# Patient Record
Sex: Male | Born: 1960 | Race: Black or African American | Hispanic: No | Marital: Married | Smoking: Former smoker
Health system: Southern US, Community
[De-identification: ages and names within clinical notes are randomized; demographics above are authoritative.]

## PROBLEM LIST (undated history)

## (undated) DIAGNOSIS — D509 Iron deficiency anemia, unspecified: Secondary | ICD-10-CM

## (undated) DIAGNOSIS — D649 Anemia, unspecified: Secondary | ICD-10-CM

## (undated) DIAGNOSIS — C61 Malignant neoplasm of prostate: Secondary | ICD-10-CM

## (undated) DIAGNOSIS — G629 Polyneuropathy, unspecified: Secondary | ICD-10-CM

## (undated) DIAGNOSIS — E1165 Type 2 diabetes mellitus with hyperglycemia: Secondary | ICD-10-CM

## (undated) DIAGNOSIS — N5231 Erectile dysfunction following radical prostatectomy: Secondary | ICD-10-CM

## (undated) DIAGNOSIS — E785 Hyperlipidemia, unspecified: Secondary | ICD-10-CM

## (undated) DIAGNOSIS — E118 Type 2 diabetes mellitus with unspecified complications: Secondary | ICD-10-CM

## (undated) DIAGNOSIS — A414 Sepsis due to anaerobes: Secondary | ICD-10-CM

## (undated) DIAGNOSIS — Z87442 Personal history of urinary calculi: Secondary | ICD-10-CM

## (undated) DIAGNOSIS — N200 Calculus of kidney: Secondary | ICD-10-CM

## (undated) DIAGNOSIS — F17200 Nicotine dependence, unspecified, uncomplicated: Secondary | ICD-10-CM

## (undated) DIAGNOSIS — M1990 Unspecified osteoarthritis, unspecified site: Secondary | ICD-10-CM

## (undated) DIAGNOSIS — N393 Stress incontinence (female) (male): Secondary | ICD-10-CM

## (undated) HISTORY — DX: Unspecified osteoarthritis, unspecified site: M19.90

## (undated) HISTORY — DX: Sepsis due to anaerobes: A41.4

## (undated) HISTORY — DX: Nicotine dependence, unspecified, uncomplicated: F17.200

## (undated) HISTORY — DX: Stress incontinence (female) (male): N39.3

## (undated) HISTORY — DX: Hyperlipidemia, unspecified: E78.5

## (undated) HISTORY — PX: PROSTATE SURGERY: SHX751

## (undated) HISTORY — DX: Type 2 diabetes mellitus with unspecified complications: E11.8

## (undated) HISTORY — DX: Malignant neoplasm of prostate: C61

## (undated) HISTORY — DX: Anemia, unspecified: D64.9

## (undated) HISTORY — DX: Polyneuropathy, unspecified: G62.9

## (undated) HISTORY — DX: Iron deficiency anemia, unspecified: D50.9

## (undated) HISTORY — DX: Type 2 diabetes mellitus with hyperglycemia: E11.65

## (undated) HISTORY — DX: Calculus of kidney: N20.0

## (undated) HISTORY — PX: WISDOM TOOTH EXTRACTION: SHX21

## (undated) HISTORY — DX: Erectile dysfunction following radical prostatectomy: N52.31

---

## 2001-10-30 ENCOUNTER — Encounter: Admission: RE | Admit: 2001-10-30 | Discharge: 2001-10-30 | Payer: Self-pay | Admitting: Urology

## 2001-10-30 ENCOUNTER — Encounter: Payer: Self-pay | Admitting: Urology

## 2005-09-25 ENCOUNTER — Encounter: Admission: RE | Admit: 2005-09-25 | Discharge: 2005-09-25 | Payer: Self-pay | Admitting: Family Medicine

## 2009-12-12 ENCOUNTER — Encounter: Admission: RE | Admit: 2009-12-12 | Discharge: 2010-03-12 | Payer: Self-pay | Admitting: General Practice

## 2011-09-24 ENCOUNTER — Emergency Department (HOSPITAL_COMMUNITY)
Admission: EM | Admit: 2011-09-24 | Discharge: 2011-09-24 | Discharge status: Against Medical Advice | Payer: BC Managed Care – PPO | Attending: Emergency Medicine | Admitting: Emergency Medicine

## 2011-09-24 ENCOUNTER — Encounter: Payer: Self-pay | Admitting: Nurse Practitioner

## 2011-09-24 DIAGNOSIS — Z0389 Encounter for observation for other suspected diseases and conditions ruled out: Secondary | ICD-10-CM | POA: Insufficient documentation

## 2011-09-24 LAB — GLUCOSE, CAPILLARY: Glucose-Capillary: 310 mg/dL — ABNORMAL HIGH (ref 70–99)

## 2011-09-24 NOTE — ED Notes (Signed)
States he was on a steroid for bronchitis last month and since then unable to get his blood sugar WNL. States it has been high mostly every day. Reports frequent urination and weight loss over past month

## 2011-09-24 NOTE — ED Notes (Signed)
Pt states that he wants to leave AMA; feels like he jumped the gun by coming in - currently on steroids. States that he feels fine and will follow up with his family physician.

## 2011-09-24 NOTE — ED Notes (Signed)
Pt st's he is diabetic but did not take any meds for same.  St's he controlled it with diet and exercise. In Dec. He was on steroids for bronchitis and it caused his sugar to go up and has been up since then.

## 2011-09-25 HISTORY — PX: PROSTATE SURGERY: SHX751

## 2012-05-29 DIAGNOSIS — C61 Malignant neoplasm of prostate: Secondary | ICD-10-CM | POA: Insufficient documentation

## 2012-08-11 DIAGNOSIS — E1140 Type 2 diabetes mellitus with diabetic neuropathy, unspecified: Secondary | ICD-10-CM | POA: Insufficient documentation

## 2012-08-11 DIAGNOSIS — G629 Polyneuropathy, unspecified: Secondary | ICD-10-CM

## 2012-08-11 DIAGNOSIS — M1990 Unspecified osteoarthritis, unspecified site: Secondary | ICD-10-CM

## 2012-08-11 DIAGNOSIS — N200 Calculus of kidney: Secondary | ICD-10-CM | POA: Insufficient documentation

## 2012-08-11 HISTORY — DX: Polyneuropathy, unspecified: G62.9

## 2012-08-11 HISTORY — DX: Unspecified osteoarthritis, unspecified site: M19.90

## 2013-02-03 DIAGNOSIS — N393 Stress incontinence (female) (male): Secondary | ICD-10-CM

## 2013-02-03 HISTORY — DX: Stress incontinence (female) (male): N39.3

## 2013-08-23 ENCOUNTER — Emergency Department (HOSPITAL_COMMUNITY)
Admission: EM | Admit: 2013-08-23 | Discharge: 2013-08-23 | Disposition: A | Discharge status: Home / Self Care | Payer: 59 | Attending: Emergency Medicine | Admitting: Emergency Medicine

## 2013-08-23 ENCOUNTER — Encounter (HOSPITAL_COMMUNITY): Payer: Self-pay | Admitting: Emergency Medicine

## 2013-08-23 DIAGNOSIS — R739 Hyperglycemia, unspecified: Secondary | ICD-10-CM

## 2013-08-23 DIAGNOSIS — F17200 Nicotine dependence, unspecified, uncomplicated: Secondary | ICD-10-CM | POA: Insufficient documentation

## 2013-08-23 DIAGNOSIS — R5381 Other malaise: Secondary | ICD-10-CM | POA: Insufficient documentation

## 2013-08-23 DIAGNOSIS — Z79899 Other long term (current) drug therapy: Secondary | ICD-10-CM | POA: Insufficient documentation

## 2013-08-23 DIAGNOSIS — Z8546 Personal history of malignant neoplasm of prostate: Secondary | ICD-10-CM | POA: Insufficient documentation

## 2013-08-23 DIAGNOSIS — E119 Type 2 diabetes mellitus without complications: Secondary | ICD-10-CM | POA: Insufficient documentation

## 2013-08-23 LAB — URINE MICROSCOPIC-ADD ON

## 2013-08-23 LAB — COMPREHENSIVE METABOLIC PANEL
ALT: 19 U/L (ref 0–53)
AST: 22 U/L (ref 0–37)
Albumin: 4.2 g/dL (ref 3.5–5.2)
Alkaline Phosphatase: 127 U/L — ABNORMAL HIGH (ref 39–117)
BUN: 16 mg/dL (ref 6–23)
CO2: 25 mEq/L (ref 19–32)
Calcium: 10 mg/dL (ref 8.4–10.5)
Chloride: 96 mEq/L (ref 96–112)
Creatinine, Ser: 1.14 mg/dL (ref 0.50–1.35)
GFR calc Af Amer: 83 mL/min — ABNORMAL LOW (ref >90)
GFR calc non Af Amer: 71 mL/min — ABNORMAL LOW (ref >90)
Glucose, Bld: 545 mg/dL — ABNORMAL HIGH (ref 70–99)
Potassium: 4.5 mEq/L (ref 3.5–5.1)
Sodium: 136 mEq/L (ref 135–145)
Total Bilirubin: 0.2 mg/dL — ABNORMAL LOW (ref 0.3–1.2)
Total Protein: 8.1 g/dL (ref 6.0–8.3)

## 2013-08-23 LAB — CBC
HCT: 43.6 % (ref 39.0–52.0)
Hemoglobin: 14.2 g/dL (ref 13.0–17.0)
MCH: 30 pg (ref 26.0–34.0)
MCHC: 32.6 g/dL (ref 30.0–36.0)
MCV: 92.2 fL (ref 78.0–100.0)
Platelets: 235 10*3/uL (ref 150–400)
RBC: 4.73 MIL/uL (ref 4.22–5.81)
RDW: 12.9 % (ref 11.5–15.5)
WBC: 7.9 10*3/uL (ref 4.0–10.5)

## 2013-08-23 LAB — URINALYSIS, ROUTINE W REFLEX MICROSCOPIC
Bilirubin Urine: NEGATIVE
Glucose, UA: >1000 mg/dL — AB
Hgb urine dipstick: NEGATIVE
Ketones, ur: NEGATIVE mg/dL
Leukocytes, UA: NEGATIVE
Nitrite: NEGATIVE
Protein, ur: NEGATIVE mg/dL
Specific Gravity, Urine: 1.043 — ABNORMAL HIGH (ref 1.005–1.030)
Urobilinogen, UA: 0.2 mg/dL (ref 0.0–1.0)
pH: 5 (ref 5.0–8.0)

## 2013-08-23 LAB — GLUCOSE, CAPILLARY
Glucose-Capillary: 428 mg/dL — ABNORMAL HIGH (ref 70–99)
Glucose-Capillary: 285 mg/dL — ABNORMAL HIGH (ref 70–99)
Glucose-Capillary: 239 mg/dL — ABNORMAL HIGH (ref 70–99)

## 2013-08-23 MED ORDER — SODIUM CHLORIDE 0.9 % IV BOLUS (SEPSIS)
1000.0000 mL | Freq: Once | INTRAVENOUS | Status: AC
  Administered 2013-08-23: 1000 mL via INTRAVENOUS

## 2013-08-23 MED ORDER — INSULIN ASPART 100 UNIT/ML ~~LOC~~ SOLN
12.0000 [IU] | Freq: Once | SUBCUTANEOUS | Status: AC
  Administered 2013-08-23: 12 [IU] via INTRAVENOUS
  Filled 2013-08-23: qty 1

## 2013-08-23 MED ORDER — INSULIN ASPART 100 UNIT/ML ~~LOC~~ SOLN
10.0000 [IU] | Freq: Once | SUBCUTANEOUS | Status: AC
  Administered 2013-08-23: 10 [IU] via INTRAVENOUS
  Filled 2013-08-23: qty 1

## 2013-08-23 NOTE — ED Provider Notes (Signed)
CSN: [id number]     Arrival date & time 08/23/13  1359 History   First MD Initiated Contact with Patient 08/23/13 1423     Chief Complaint  Patient presents with  . Hyperglycemia   (Consider location/radiation/quality/duration/timing/severity/associated sxs/prior Treatment) HPI  52 year old male with hyperglycemia. Known type II diabetic. Patient states he noticed his sugars for the past month or so but has delayed seeking care. He is on glimepiride and Janumet. He reports he takes them most days but not every day and not always twice a day. He estimates he actually takes about half the recommended dose of both medicines.  He is feeling fatigued and the past month or so. Describes is low energy levels. Does feel thirsty and has been urinating a lot. No urinary complaints otherwise. No fevers or chills. No rash. No cough. No unusual swelling. Past Medical History  Diagnosis Date  . Diabetes mellitus   . Cancer     prostate   History reviewed. No pertinent past surgical history. No family history on file. History  Substance Use Topics  . Smoking status: Current Every Day Smoker -- 0.33 packs/day    Types: Cigarettes  . Smokeless tobacco: Not on file  . Alcohol Use: Yes     Comment: occassional    Review of Systems  All systems reviewed and negative, other than as noted in HPI.   Allergies  Codeine  Home Medications   Current Outpatient Rx  Name  Route  Sig  Dispense  Refill  . atorvastatin (LIPITOR) 40 MG tablet   Oral   Take 40 mg by mouth daily.         [name] glimepiride (AMARYL) 4 MG tablet   Oral   Take 4 mg by mouth 2 (two) times daily.         . sitaGLIPtin-metformin (JANUMET) 50-1000 MG per tablet   Oral   Take 1 tablet by mouth 2 (two) times daily with a meal.          BP 119/75  Pulse 100  Temp(Src) 98.1 F (36.7 C) (Oral)  Resp 18  SpO2 100% Physical Exam  Nursing note and vitals reviewed. Constitutional: He appears well-developed and  well-nourished. No distress.  HENT:  Head: Normocephalic and atraumatic.  Eyes: Conjunctivae are normal. Right eye exhibits no discharge. Left eye exhibits no discharge.  Neck: Neck supple.  Cardiovascular: Normal rate, regular rhythm and normal heart sounds.  Exam reveals no gallop and no friction rub.   No murmur heard. Pulmonary/Chest: Effort normal and breath sounds normal. No respiratory distress.  Abdominal: Soft. He exhibits no distension. There is no tenderness.  Musculoskeletal: He exhibits no edema and no tenderness.  Neurological: He is alert.  Skin: Skin is warm and dry.  Psychiatric: He has a normal mood and affect. His behavior is normal. Thought content normal.    ED Course  Procedures (including critical care time) Labs Review Labs Reviewed  GLUCOSE, CAPILLARY - Abnormal; Notable for the following:    Glucose-Capillary 428 (*)    All other components within normal limits  COMPREHENSIVE METABOLIC PANEL - Abnormal; Notable for the following:    Glucose, Bld 545 (*)    Alkaline Phosphatase 127 (*)    Total Bilirubin 0.2 (*)    GFR calc non Af Amer 71 (*)    GFR calc Af Amer 83 (*)    All other components within normal limits  URINALYSIS, ROUTINE W REFLEX MICROSCOPIC - Abnormal; Notable for the following:  APPearance CLOUDY (*)    Specific Gravity, Urine 1.043 (*)    Glucose, UA >1000 (*)    All other components within normal limits  GLUCOSE, CAPILLARY - Abnormal; Notable for the following:    Glucose-Capillary 285 (*)    All other components within normal limits  CBC  URINE MICROSCOPIC-ADD ON   Imaging Review No results found.  EKG Interpretation   None       MDM   1. Hyperglycemia without ketosis    52 year old male with hyperglycemia. No anion gap. No hematuria. Hemodynamically stable. Improved with IV fluids and insulin. Patient is poorly compliant with his current medications. Discussed the need for compliance as it'll make any additional  medication recommendations more difficult. Understands the chronicity of a disease such as diabetes and the need for regular medical care, ideally by a single provider who can continue to follow him. Return precautions were discussed.    Raeford Razor, MD 08/26/13 [phone]

## 2013-08-23 NOTE — ED Notes (Signed)
Pt states that he is type 2 Diabetic and his blood sugar has been high for past month or so and pt stated that today he was going to do something about it.  Pt states that he went to urgent care and was told to come to ED.  Pt states that his CBG read 550 at home earlier this morning.

## 2015-03-22 ENCOUNTER — Emergency Department (HOSPITAL_COMMUNITY)
Admission: EM | Admit: 2015-03-22 | Discharge: 2015-03-23 | Discharge status: Against Medical Advice | Payer: Self-pay | Attending: Emergency Medicine | Admitting: Emergency Medicine

## 2015-03-22 ENCOUNTER — Encounter (HOSPITAL_COMMUNITY): Payer: Self-pay | Admitting: Emergency Medicine

## 2015-03-22 DIAGNOSIS — R1110 Vomiting, unspecified: Secondary | ICD-10-CM | POA: Insufficient documentation

## 2015-03-22 DIAGNOSIS — Z720 Tobacco use: Secondary | ICD-10-CM | POA: Insufficient documentation

## 2015-03-22 DIAGNOSIS — R6883 Chills (without fever): Secondary | ICD-10-CM | POA: Insufficient documentation

## 2015-03-22 DIAGNOSIS — E119 Type 2 diabetes mellitus without complications: Secondary | ICD-10-CM | POA: Insufficient documentation

## 2015-03-22 MED ORDER — ONDANSETRON 8 MG PO TBDP
8.0000 mg | ORAL_TABLET | Freq: Once | ORAL | Status: AC
  Administered 2015-03-22: 8 mg via ORAL
  Filled 2015-03-22: qty 1

## 2015-03-22 NOTE — ED Notes (Signed)
Pt presents to ED c/o emesis and chills for the last hour and a half.  He has vomited three times in the last hour but denies pain at this time.  He also reports intermittent chills.  No diarrhea yet but he is concerned that it could happen.  No dizziness.

## 2015-03-23 ENCOUNTER — Encounter (HOSPITAL_COMMUNITY): Payer: Self-pay | Admitting: Emergency Medicine

## 2015-03-23 ENCOUNTER — Emergency Department (HOSPITAL_COMMUNITY): Payer: Self-pay

## 2015-03-23 ENCOUNTER — Inpatient Hospital Stay (HOSPITAL_COMMUNITY)
Admission: EM | Admit: 2015-03-23 | Discharge: 2015-03-26 | DRG: 872 | Disposition: A | Discharge status: Home / Self Care | Payer: Self-pay | Attending: Internal Medicine | Admitting: Internal Medicine

## 2015-03-23 ENCOUNTER — Inpatient Hospital Stay (HOSPITAL_COMMUNITY): Payer: MEDICAID

## 2015-03-23 DIAGNOSIS — D72829 Elevated white blood cell count, unspecified: Secondary | ICD-10-CM

## 2015-03-23 DIAGNOSIS — R52 Pain, unspecified: Secondary | ICD-10-CM

## 2015-03-23 DIAGNOSIS — E876 Hypokalemia: Secondary | ICD-10-CM | POA: Diagnosis present

## 2015-03-23 DIAGNOSIS — Z885 Allergy status to narcotic agent status: Secondary | ICD-10-CM

## 2015-03-23 DIAGNOSIS — Z9079 Acquired absence of other genital organ(s): Secondary | ICD-10-CM | POA: Diagnosis present

## 2015-03-23 DIAGNOSIS — IMO0002 Reserved for concepts with insufficient information to code with codable children: Secondary | ICD-10-CM

## 2015-03-23 DIAGNOSIS — R6510 Systemic inflammatory response syndrome (SIRS) of non-infectious origin without acute organ dysfunction: Secondary | ICD-10-CM

## 2015-03-23 DIAGNOSIS — A419 Sepsis, unspecified organism: Secondary | ICD-10-CM | POA: Diagnosis present

## 2015-03-23 DIAGNOSIS — C61 Malignant neoplasm of prostate: Secondary | ICD-10-CM

## 2015-03-23 DIAGNOSIS — Z87440 Personal history of urinary (tract) infections: Secondary | ICD-10-CM

## 2015-03-23 DIAGNOSIS — N12 Tubulo-interstitial nephritis, not specified as acute or chronic: Secondary | ICD-10-CM | POA: Diagnosis present

## 2015-03-23 DIAGNOSIS — N179 Acute kidney failure, unspecified: Secondary | ICD-10-CM | POA: Diagnosis present

## 2015-03-23 DIAGNOSIS — N289 Disorder of kidney and ureter, unspecified: Secondary | ICD-10-CM

## 2015-03-23 DIAGNOSIS — R740 Nonspecific elevation of levels of transaminase and lactic acid dehydrogenase [LDH]: Secondary | ICD-10-CM | POA: Diagnosis present

## 2015-03-23 DIAGNOSIS — Z6832 Body mass index (BMI) 32.0-32.9, adult: Secondary | ICD-10-CM

## 2015-03-23 DIAGNOSIS — E1165 Type 2 diabetes mellitus with hyperglycemia: Secondary | ICD-10-CM | POA: Diagnosis present

## 2015-03-23 DIAGNOSIS — Z794 Long term (current) use of insulin: Secondary | ICD-10-CM

## 2015-03-23 DIAGNOSIS — A414 Sepsis due to anaerobes: Secondary | ICD-10-CM | POA: Insufficient documentation

## 2015-03-23 DIAGNOSIS — R7881 Bacteremia: Secondary | ICD-10-CM | POA: Diagnosis present

## 2015-03-23 DIAGNOSIS — B961 Klebsiella pneumoniae [K. pneumoniae] as the cause of diseases classified elsewhere: Secondary | ICD-10-CM | POA: Diagnosis present

## 2015-03-23 DIAGNOSIS — Z8546 Personal history of malignant neoplasm of prostate: Secondary | ICD-10-CM

## 2015-03-23 DIAGNOSIS — E669 Obesity, unspecified: Secondary | ICD-10-CM | POA: Diagnosis present

## 2015-03-23 DIAGNOSIS — Z79899 Other long term (current) drug therapy: Secondary | ICD-10-CM

## 2015-03-23 DIAGNOSIS — E785 Hyperlipidemia, unspecified: Secondary | ICD-10-CM | POA: Diagnosis present

## 2015-03-23 DIAGNOSIS — N390 Urinary tract infection, site not specified: Secondary | ICD-10-CM

## 2015-03-23 DIAGNOSIS — F17210 Nicotine dependence, cigarettes, uncomplicated: Secondary | ICD-10-CM | POA: Diagnosis present

## 2015-03-23 DIAGNOSIS — A4159 Other Gram-negative sepsis: Principal | ICD-10-CM | POA: Diagnosis present

## 2015-03-23 HISTORY — DX: Reserved for concepts with insufficient information to code with codable children: IMO0002

## 2015-03-23 HISTORY — DX: Type 2 diabetes mellitus with hyperglycemia: E11.65

## 2015-03-23 LAB — URINE MICROSCOPIC-ADD ON

## 2015-03-23 LAB — GLUCOSE, CAPILLARY
GLUCOSE-CAPILLARY: 179 mg/dL — AB (ref 65–99)
Glucose-Capillary: 145 mg/dL — ABNORMAL HIGH (ref 65–99)

## 2015-03-23 LAB — CBC WITH DIFFERENTIAL/PLATELET
BASOS ABS: 0 10*3/uL (ref 0.0–0.1)
BASOS PCT: 0 % (ref 0–1)
Basophils Absolute: 0 10*3/uL (ref 0.0–0.1)
Basophils Relative: 0 % (ref 0–1)
EOS ABS: 0 10*3/uL (ref 0.0–0.7)
EOS ABS: 0 10*3/uL (ref 0.0–0.7)
EOS PCT: 0 % (ref 0–5)
EOS PCT: 1 % (ref 0–5)
HEMATOCRIT: 37.9 % — AB (ref 39.0–52.0)
HEMATOCRIT: 41.3 % (ref 39.0–52.0)
HEMOGLOBIN: 13 g/dL (ref 13.0–17.0)
Hemoglobin: 12 g/dL — ABNORMAL LOW (ref 13.0–17.0)
LYMPHS ABS: 0.4 10*3/uL — AB (ref 0.7–4.0)
LYMPHS PCT: 14 % (ref 12–46)
Lymphocytes Relative: 2 % — ABNORMAL LOW (ref 12–46)
Lymphs Abs: 1 10*3/uL (ref 0.7–4.0)
MCH: 29.9 pg (ref 26.0–34.0)
MCH: 30.1 pg (ref 26.0–34.0)
MCHC: 31.5 g/dL (ref 30.0–36.0)
MCHC: 31.7 g/dL (ref 30.0–36.0)
MCV: 94.5 fL (ref 78.0–100.0)
MCV: 95.6 fL (ref 78.0–100.0)
MONO ABS: 0 10*3/uL — AB (ref 0.1–1.0)
Monocytes Absolute: 0.4 10*3/uL (ref 0.1–1.0)
Monocytes Relative: 0 % — ABNORMAL LOW (ref 3–12)
Monocytes Relative: 2 % — ABNORMAL LOW (ref 3–12)
Neutro Abs: 17.2 10*3/uL — ABNORMAL HIGH (ref 1.7–7.7)
Neutro Abs: 6 10*3/uL (ref 1.7–7.7)
Neutrophils Relative %: 85 % — ABNORMAL HIGH (ref 43–77)
Neutrophils Relative %: 96 % — ABNORMAL HIGH (ref 43–77)
Platelets: 201 10*3/uL (ref 150–400)
Platelets: 244 10*3/uL (ref 150–400)
RBC: 4.01 MIL/uL — ABNORMAL LOW (ref 4.22–5.81)
RBC: 4.32 MIL/uL (ref 4.22–5.81)
RDW: 13.4 % (ref 11.5–15.5)
RDW: 13.5 % (ref 11.5–15.5)
WBC MORPHOLOGY: INCREASED
WBC: 18 10*3/uL — ABNORMAL HIGH (ref 4.0–10.5)
WBC: 7.1 10*3/uL (ref 4.0–10.5)

## 2015-03-23 LAB — URINALYSIS, ROUTINE W REFLEX MICROSCOPIC
Ketones, ur: NEGATIVE mg/dL
NITRITE: POSITIVE — AB
Protein, ur: 30 mg/dL — AB
Specific Gravity, Urine: 1.027 (ref 1.005–1.030)
Urobilinogen, UA: 1 mg/dL (ref 0.0–1.0)
pH: 6 (ref 5.0–8.0)

## 2015-03-23 LAB — COMPREHENSIVE METABOLIC PANEL
ALBUMIN: 4.2 g/dL (ref 3.5–5.0)
ALK PHOS: 89 U/L (ref 38–126)
ALK PHOS: 98 U/L (ref 38–126)
ALT: 21 U/L (ref 17–63)
ALT: 72 U/L — ABNORMAL HIGH (ref 17–63)
ANION GAP: 10 (ref 5–15)
AST: 23 U/L (ref 15–41)
AST: 107 U/L — AB (ref 15–41)
Albumin: 3.6 g/dL (ref 3.5–5.0)
Anion gap: 13 (ref 5–15)
BILIRUBIN TOTAL: 0.5 mg/dL (ref 0.3–1.2)
BUN: 15 mg/dL (ref 6–20)
BUN: 18 mg/dL (ref 6–20)
CALCIUM: 8.9 mg/dL (ref 8.9–10.3)
CHLORIDE: 104 mmol/L (ref 101–111)
CHLORIDE: 104 mmol/L (ref 101–111)
CO2: 25 mmol/L (ref 22–32)
CO2: 22 mmol/L (ref 22–32)
Calcium: 9.6 mg/dL (ref 8.9–10.3)
Creatinine, Ser: 1.29 mg/dL — ABNORMAL HIGH (ref 0.61–1.24)
Creatinine, Ser: 1.71 mg/dL — ABNORMAL HIGH (ref 0.61–1.24)
GFR calc Af Amer: >60 mL/min (ref >60)
GFR calc Af Amer: 50 mL/min — ABNORMAL LOW (ref >60)
GFR calc non Af Amer: >60 mL/min (ref >60)
GFR calc non Af Amer: 43 mL/min — ABNORMAL LOW (ref >60)
GLUCOSE: 248 mg/dL — AB (ref 65–99)
Glucose, Bld: 295 mg/dL — ABNORMAL HIGH (ref 65–99)
POTASSIUM: 4.4 mmol/L (ref 3.5–5.1)
Potassium: 4.4 mmol/L (ref 3.5–5.1)
Sodium: 139 mmol/L (ref 135–145)
Sodium: 139 mmol/L (ref 135–145)
Total Bilirubin: 0.7 mg/dL (ref 0.3–1.2)
Total Protein: 8.5 g/dL — ABNORMAL HIGH (ref 6.5–8.1)
Total Protein: 7.5 g/dL (ref 6.5–8.1)

## 2015-03-23 LAB — I-STAT CG4 LACTIC ACID, ED
LACTIC ACID, VENOUS: 3.83 mmol/L — AB (ref 0.5–2.0)
LACTIC ACID, VENOUS: 2.47 mmol/L — AB (ref 0.5–2.0)

## 2015-03-23 LAB — LIPASE, BLOOD: Lipase: 14 U/L — ABNORMAL LOW (ref 22–51)

## 2015-03-23 LAB — LACTIC ACID, PLASMA
LACTIC ACID, VENOUS: 1.5 mmol/L (ref 0.5–2.0)
Lactic Acid, Venous: 2.1 mmol/L (ref 0.5–2.0)

## 2015-03-23 MED ORDER — ATORVASTATIN CALCIUM 40 MG PO TABS: 40.0000 mg | ORAL_TABLET | Freq: Every day | ORAL | Status: DC

## 2015-03-23 MED ORDER — ONDANSETRON HCL 4 MG/2ML IJ SOLN: 4.0000 mg | Freq: Four times a day (QID) | INTRAMUSCULAR | Status: DC | PRN

## 2015-03-23 MED ORDER — PIPERACILLIN-TAZOBACTAM 3.375 G IVPB
3.3750 g | Freq: Three times a day (TID) | INTRAVENOUS | Status: DC
  Administered 2015-03-23 – 2015-03-26 (×8): 3.375 g via INTRAVENOUS
  Filled 2015-03-23 (×9): qty 50

## 2015-03-23 MED ORDER — VANCOMYCIN HCL IN DEXTROSE 750-5 MG/150ML-% IV SOLN
750.0000 mg | Freq: Two times a day (BID) | INTRAVENOUS | Status: DC
  Administered 2015-03-24 – 2015-03-25 (×3): 750 mg via INTRAVENOUS
  Filled 2015-03-23 (×4): qty 150

## 2015-03-23 MED ORDER — INSULIN GLARGINE 100 UNIT/ML ~~LOC~~ SOLN
24.0000 [IU] | Freq: Every day | SUBCUTANEOUS | Status: DC
  Administered 2015-03-23 – 2015-03-24 (×2): 24 [IU] via SUBCUTANEOUS
  Filled 2015-03-23 (×2): qty 0.24

## 2015-03-23 MED ORDER — INSULIN ASPART 100 UNIT/ML ~~LOC~~ SOLN
3.0000 [IU] | Freq: Three times a day (TID) | SUBCUTANEOUS | Status: DC
  Administered 2015-03-23 – 2015-03-25 (×5): 3 [IU] via SUBCUTANEOUS

## 2015-03-23 MED ORDER — SODIUM CHLORIDE 0.9 % IJ SOLN
3.0000 mL | Freq: Two times a day (BID) | INTRAMUSCULAR | Status: DC
  Administered 2015-03-23 – 2015-03-25 (×2): 3 mL via INTRAVENOUS

## 2015-03-23 MED ORDER — SODIUM CHLORIDE 0.9 % IV BOLUS (SEPSIS)
1000.0000 mL | Freq: Once | INTRAVENOUS | Status: AC
  Administered 2015-03-23: 1000 mL via INTRAVENOUS

## 2015-03-23 MED ORDER — ACETAMINOPHEN 325 MG PO TABS
650.0000 mg | ORAL_TABLET | Freq: Once | ORAL | Status: AC
  Administered 2015-03-23: 650 mg via ORAL
  Filled 2015-03-23: qty 2

## 2015-03-23 MED ORDER — SODIUM CHLORIDE 0.9 % IV BOLUS (SEPSIS)
500.0000 mL | Freq: Once | INTRAVENOUS | Status: AC
Start: 2015-03-23 — End: 2015-03-23
  Administered 2015-03-23: 500 mL via INTRAVENOUS

## 2015-03-23 MED ORDER — ONDANSETRON HCL 4 MG/2ML IJ SOLN: 4.0000 mg | Freq: Three times a day (TID) | INTRAMUSCULAR | Status: DC | PRN

## 2015-03-23 MED ORDER — SODIUM CHLORIDE 0.9 % IV BOLUS (SEPSIS)
500.0000 mL | INTRAVENOUS | Status: AC
  Administered 2015-03-23: 500 mL via INTRAVENOUS

## 2015-03-23 MED ORDER — INSULIN ASPART 100 UNIT/ML ~~LOC~~ SOLN
0.0000 [IU] | Freq: Every day | SUBCUTANEOUS | Status: DC
Start: 2015-03-23 — End: 2015-03-26

## 2015-03-23 MED ORDER — SODIUM CHLORIDE 0.9 % IV SOLN
INTRAVENOUS | Status: DC
  Administered 2015-03-23 – 2015-03-24 (×2): via INTRAVENOUS

## 2015-03-23 MED ORDER — ACETAMINOPHEN 325 MG PO TABS
650.0000 mg | ORAL_TABLET | Freq: Four times a day (QID) | ORAL | Status: DC | PRN
  Administered 2015-03-24 – 2015-03-26 (×3): 650 mg via ORAL
  Filled 2015-03-23 (×3): qty 2

## 2015-03-23 MED ORDER — VANCOMYCIN HCL IN DEXTROSE 1-5 GM/200ML-% IV SOLN: 1000.0000 mg | Freq: Once | INTRAVENOUS | Status: DC

## 2015-03-23 MED ORDER — PIPERACILLIN-TAZOBACTAM 3.375 G IVPB 30 MIN
3.3750 g | Freq: Once | INTRAVENOUS | Status: AC
  Administered 2015-03-23: 3.375 g via INTRAVENOUS
  Filled 2015-03-23: qty 50

## 2015-03-23 MED ORDER — HEPARIN SODIUM (PORCINE) 5000 UNIT/ML IJ SOLN
5000.0000 [IU] | Freq: Three times a day (TID) | INTRAMUSCULAR | Status: DC
  Administered 2015-03-23 – 2015-03-25 (×7): 5000 [IU] via SUBCUTANEOUS
  Filled 2015-03-23 (×7): qty 1

## 2015-03-23 MED ORDER — PIPERACILLIN-TAZOBACTAM 3.375 G IVPB 30 MIN: 3.3750 g | Freq: Three times a day (TID) | INTRAVENOUS | Status: DC

## 2015-03-23 MED ORDER — SODIUM CHLORIDE 0.9 % IV SOLN: INTRAVENOUS | Status: DC

## 2015-03-23 MED ORDER — ACETAMINOPHEN 650 MG RE SUPP: 650.0000 mg | Freq: Four times a day (QID) | RECTAL | Status: DC | PRN

## 2015-03-23 MED ORDER — VANCOMYCIN HCL 10 G IV SOLR
2000.0000 mg | Freq: Once | INTRAVENOUS | Status: AC
  Administered 2015-03-23: 2000 mg via INTRAVENOUS
  Filled 2015-03-23: qty 2000

## 2015-03-23 MED ORDER — SODIUM CHLORIDE 0.9 % IV BOLUS (SEPSIS)
1000.0000 mL | INTRAVENOUS | Status: AC
  Administered 2015-03-23 (×3): 1000 mL via INTRAVENOUS

## 2015-03-23 MED ORDER — INSULIN ASPART 100 UNIT/ML ~~LOC~~ SOLN
0.0000 [IU] | Freq: Three times a day (TID) | SUBCUTANEOUS | Status: DC
  Administered 2015-03-23: 3 [IU] via SUBCUTANEOUS
  Administered 2015-03-24: 5 [IU] via SUBCUTANEOUS
  Administered 2015-03-24: 2 [IU] via SUBCUTANEOUS
  Administered 2015-03-24: 5 [IU] via SUBCUTANEOUS
  Administered 2015-03-25 (×2): 8 [IU] via SUBCUTANEOUS
  Administered 2015-03-25: 5 [IU] via SUBCUTANEOUS
  Administered 2015-03-26: 3 [IU] via SUBCUTANEOUS

## 2015-03-23 MED ORDER — NICOTINE 14 MG/24HR TD PT24
14.0000 mg | MEDICATED_PATCH | Freq: Every day | TRANSDERMAL | Status: DC
  Administered 2015-03-23 – 2015-03-26 (×4): 14 mg via TRANSDERMAL
  Filled 2015-03-23 (×4): qty 1

## 2015-03-23 MED ORDER — ONDANSETRON HCL 4 MG PO TABS: 4.0000 mg | ORAL_TABLET | Freq: Four times a day (QID) | ORAL | Status: DC | PRN

## 2015-03-23 NOTE — ED Notes (Signed)
Awake. Verbally responsive. A/O x4. Resp even and unlabored. No audible adventitious breath sounds noted. ABC's intact. Pt reported having fever and sudden onset of n/v and back pains yesterday. Symptoms have subsided but pt has fever and chills.

## 2015-03-23 NOTE — H&P (Signed)
[facility] History and Physical  Wyatt Meyer BTD:[id number] DOB: 09/14/1961 DOA: 03/23/2015  Referring physician: PCP: Foye Spurling, MD   Chief Complaint:  Subjective fever with chills and weakness since one day  HPI:  54 year old male with history of uncontrolled type 2 diabetes mellitus on insulin, hyperlipidemia, tobacco use and history of prostate cancer status post resection a few years back and follows at [facility] presented to the ED yesterday with acute onset of subjective fever with chills, diaphoresis and weakness. He also had several episodes of vomiting of food particles. Since he had to wait for a long time he left home without being seen in the ED yesterday. He returned back today with persistent chills and weakness and emesis. He reports having some low back pain as well. Denies according temperature at home. He denies eating anything outside or sick contacts. Denies dysuria, flank pain or hematuria. He reports is blood glucose to be uncontrolled and mostly in the range of 200-250s. He reports history of UTI in the past. Denies any headache, blurred vision, dizziness, chest pain, palpitations, shortness of breath, abdominal pain or diarrhea. Denies any change in his weight but has poor appetite since yesterday.  Course in the ED  patient was septic with fever of 101.91F, tachycardic to 125 and tachypnea 227. Had low normal blood pressure of 94/56 mmHg. Normal O2 sat on room air. Blood work initially showed normal WBC but subsequently had significant leukocytosis of 18 K. Lactic acid was elevated to 3.8. Hemoglobin 12 and platelets of 201. Chemistry unremarkable except for elevated creatinine of 1.71 and glucose of 295. Chest x-ray was treated for infection. UA was positive for UTI. Lipase was normal. Mildly elevated AST and ALT as well.  Code sepsis was initiated and patient given 3 L IV normal saline bolus following which his heart rate and blood pressure improved. Is given  empiric IV vancomycin and Zosyn in the ED prior to UA or urine culture sent. Follow-up lactic acid after IV fluid bolus improved to 2.5. Hospitalists admission requested to telemetry.  Review of Systems:  Constitutional:  fever, chills, diaphoresis, appetite change and fatigue.  HEENT: Denies visual or hearing symptoms, difficulty swallowing, congestion, neck pain or stiffness  Respiratory: Denies SOB, DOE, cough, chest tightness,  and wheezing.   Cardiovascular: Denies chest pain, palpitations and leg swelling.  Gastrointestinal:  nausea, vomiting, denies abdominal pain, diarrhea, constipation, blood in stool and abdominal distention.  Genitourinary: Denies dysuria, urgency, frequency, hematuria, flank pain and difficulty urinating.  Endocrine: Denies: hot or cold intolerance,  polyuria, polydipsia. Musculoskeletal: Back pain, Denies myalgias,  joint swelling, arthralgias and gait problem.  Skin: Denies pallor, rash and wound.  Neurological: Weakness and unsteadiness, Denies dizziness, seizures, syncope, , light-headedness, numbness and headaches.  Hematological: Denies adenopathy.  Psychiatric/Behavioral: Denies confusion   Past Medical History  Diagnosis Date  . Diabetes mellitus   . Cancer     prostate   Past Surgical History  Procedure Laterality Date  . Prostate surgery  2013   Social History:  reports that he has been smoking Cigarettes.  He has been smoking about 0.33 packs per day. He does not have any smokeless tobacco history on file. He reports that he drinks alcohol. He reports that he does not use illicit drugs.  Allergies  Allergen Reactions  . Codeine Nausea And Vomiting    No family history on file.  Prior to Admission medications   Medication Sig Start Date End Date Taking? Authorizing Provider  atorvastatin (  LIPITOR) 40 MG tablet Take 40 mg by mouth daily.   Yes Historical Provider, MD  HUMALOG KWIKPEN 100 UNIT/ML KiwkPen Inject 16 Units into the skin 2  (two) times daily with a meal. 01/18/15  Yes Historical Provider, MD  LANTUS SOLOSTAR 100 UNIT/ML Solostar Pen Inject 24 Units into the skin at bedtime. 01/18/15  Yes Historical Provider, MD  metFORMIN (GLUCOPHAGE) 500 MG tablet Take 500 mg by mouth 2 (two) times daily with a meal.    Historical Provider, MD     Physical Exam:  Filed Vitals:   03/23/15 1345 03/23/15 1400 03/23/15 1415 03/23/15 1430  BP: 96/40 115/65 100/49 128/72  Pulse: 102 100 101 101  Temp:      TempSrc:      Resp: 22 15 20 27   Weight:      SpO2: 99% 99% 97% 98%    Constitutional: Vital signs reviewed.  Lasered obese male in no acute distress HEENT: no pallor, no icterus, dry oral mucosa, no cervical lymphadenopathy, supple neck Cardiovascular: RRR, S1 normal, S2 normal, no MRG Chest: CTAB, no wheezes, rales, or rhonchi Abdominal: Soft. Non-tender, non-distended, bowel sounds are normal,  GU: no CVA tenderness Ext: warm, no edema Neurological: Alert and oriented, nonfocal  Labs on Admission:  Basic Metabolic Panel:  Recent Labs Lab 03/23/15 0001 03/23/15 1118  NA 139 139  K 4.4 4.4  CL 104 104  CO2 25 22  GLUCOSE 248* 295*  BUN 15 18  CREATININE 1.29* 1.71*  CALCIUM 9.6 8.9   Liver Function Tests:  Recent Labs Lab 03/23/15 0001 03/23/15 1118  AST 23 107*  ALT 21 72*  ALKPHOS 98 89  BILITOT 0.5 0.7  PROT 8.5* 7.5  ALBUMIN 4.2 3.6    Recent Labs Lab 03/23/15 1118  LIPASE 14*   No results for input(s): AMMONIA in the last 168 hours. CBC:  Recent Labs Lab 03/23/15 0001 03/23/15 1118  WBC 7.1 18.0*  NEUTROABS 6.0 17.2*  HGB 13.0 12.0*  HCT 41.3 37.9*  MCV 95.6 94.5  PLT 244 201   Cardiac Enzymes: No results for input(s): CKTOTAL, CKMB, CKMBINDEX, TROPONINI in the last 168 hours. BNP: Invalid input(s): POCBNP CBG: No results for input(s): GLUCAP in the last 168 hours.  Radiological Exams on Admission: Dg Chest 2 View  03/23/2015   CLINICAL DATA:  Fever, chills since  yesterday. Mid back pain. Smoker.  EXAM: CHEST  2 VIEW  COMPARISON:  None.  FINDINGS: Linear densities in the right lung base could reflect scarring or atelectasis. Left lung is clear. Heart is normal size. No effusions. No acute bony abnormality.  IMPRESSION: Right base scarring or atelectasis.  No active disease.   Electronically Signed   By: Rolm Baptise M.D.   On: 03/23/2015 11:51    EKG: Independently reviewed, Sinus tachycardia at 106, nonspecific ST changes in lateral leads  Assessment/Plan   Principal Problem:   Sepsis Secondary to underlying UTI/? Pyelonephritis. -Admit to telemetry. -Aggressive IV hydration with normal saline (patient received 2 L normal saline bolus in the ED). Monitor WBC and lactate. -Patient received empiric IV vancomycin and Zosyn in the ED. Given underlying sepsis and uncontrolled diabetes , cover broadly with IV Zosyn. -Follow urine and blood cultures. -Follow repeat lactic acid. -Supportive care with Tylenol and antiemetics.  Active Problems:   Acute versus acute on chronic kidney injury Check FeNa. Possibly prerenal. Monitor with IV hydration.    UTI (lower urinary tract infection) As outlined above.  Uncontrolled diabetes mellitus type 2 without complications Reports his A1c to be quite high. Fingersticks range 200-250s. Reports being compliant with his insulin regimen but not adherent to diet. Will resume home dose Lantus and add male aspart. Check A1c. Add Sliding scale.  Transaminitis Check hepatitis panel. Will hold statin  Tobacco abuse Counseled on cessation. We'll order nicotine patch    Obesity Needs counseling on dietary adherence and exercise.    Hyperlipemia Continue statin    H/O prostate cancer Status post prostatectomy. Follows at [facility] .    Diet: Diabetic  DVT prophylaxis: sq heparin     Code Status: full code Family Communication: discussed with wife and sister at bedside Disposition Plan: Admit to  telemetry  Louellen Molder [facility] Pager [phone]  Total time spent on admission :70 minutes  If 7PM-7AM, please contact night-coverage [email or web address] Password [facility] 03/23/2015, 2:58 PM

## 2015-03-23 NOTE — ED Notes (Addendum)
Tatyana, PA stated that the ordered urine sample is not needed.

## 2015-03-23 NOTE — Progress Notes (Signed)
ANTIBIOTIC CONSULT NOTE - INITIAL  Pharmacy Consult for Vancomycin & Zosyn Indication: rule out sepsis  Allergies  Allergen Reactions  . Codeine Nausea And Vomiting   Patient Measurements:   Total body weight: 240 lb, 109 kg  Vital Signs: Temp: 100.9 F (38.3 C) (06/29 1046) Temp Source: Oral (06/29 1046) BP: 94/56 mmHg (06/29 1046) Pulse Rate: 125 (06/29 1046) Intake/Output from previous day:   Intake/Output from this shift:    Labs:  Recent Labs  03/23/15 0001  WBC 7.1  HGB 13.0  PLT 244  CREATININE 1.29*   CrCl cannot be calculated (Unknown ideal weight.). No results for input(s): VANCOTROUGH, VANCOPEAK, VANCORANDOM, GENTTROUGH, GENTPEAK, GENTRANDOM, TOBRATROUGH, TOBRAPEAK, TOBRARND, AMIKACINPEAK, AMIKACINTROU, AMIKACIN in the last 72 hours.   Microbiology: No results found for this or any previous visit (from the past 720 hour(s)).  Medical History: Past Medical History  Diagnosis Date  . Diabetes mellitus   . Cancer     prostate   Medications:  Scheduled:   Anti-infectives    Start     Dose/Rate Route Frequency Ordered Stop   03/23/15 1145  piperacillin-tazobactam (ZOSYN) IVPB 3.375 g     3.375 g 100 mL/hr over 30 Minutes Intravenous  Once 03/23/15 1136     03/23/15 1145  vancomycin (VANCOCIN) IVPB 1000 mg/200 mL premix     1,000 mg 200 mL/hr over 60 Minutes Intravenous  Once 03/23/15 1136       Assessment: 81 yoM to ED last night (6/28), left before being seen by MD, returned this am with c/o fever, chills, N/V, back pain. CXray unremarkable, no active disease. Pharmacy to dose Vancomycin & Zosyn, rule out sepsis    Goal of Therapy:  Vancomycin trough level 15-20 mcg/ml  Plan:   Vancomycin 2gm x1, then 750mg  q12  Zosyn 3.375gm q8hr 4 hr infusion  Minda Ditto PharmD Pager [phone] 03/23/2015, 11:53 AM

## 2015-03-23 NOTE — ED Notes (Signed)
Notified Pa Tatyana and MD Docherty of high lactic acid

## 2015-03-23 NOTE — ED Notes (Addendum)
Awake. Verbally responsive. A/O x4. Resp even and unlabored. No audible adventitious breath sounds noted. ABC's intact. SR on monitor. IV infusing NS and ABT without difficulty. Pt had no adverse reaction from IV Zosyn. Family at bedside.

## 2015-03-23 NOTE — ED Notes (Signed)
Pt advised ER staff that he did not want to wait any longer and that he was leaving; pt seen exiting the doors and lobby; pt not in lobby or area immediately outside

## 2015-03-23 NOTE — ED Notes (Signed)
Awake. Verbally responsive. A/O x4. Resp even and unlabored. No audible adventitious breath sounds noted. ABC's intact. SR on monitor. IV infusing NS without difficulty. Family at bedside.

## 2015-03-23 NOTE — ED Notes (Signed)
Patient transported to X-ray 

## 2015-03-23 NOTE — ED Notes (Signed)
Pt can go to floor at14:55.Marland Kitchenklj

## 2015-03-23 NOTE — ED Notes (Signed)
Awake. Verbally responsive. A/O x4. Resp even and unlabored. No audible adventitious breath sounds noted. ABC's intact. SR on monitor. IV completed ABT without adverse effects noted. Family at bedside.

## 2015-03-23 NOTE — ED Provider Notes (Signed)
CSN: [id number]     Arrival date & time 03/23/15  1037 History   First MD Initiated Contact with Patient 03/23/15 1050     Chief Complaint  Patient presents with  . Fever     (Consider location/radiation/quality/duration/timing/severity/associated sxs/prior Treatment) HPI Wyatt Meyer is a 54 y.o. male history of diabetes, presents to emergency department complaining of nausea, fever, chills, lower back pain. Patient states his symptoms started yesterday evening. Symptoms began with chills and nausea. He reports several episodes of vomiting last night and once this morning. He actually came to emergency department last night by end up leaving without being seen because of the long wait. He states symptoms did not improve today so he returned. Patient denies checking his temperature at home. He denies any changes in his bowels. He denies any abdominal pain. He denies any urinary symptoms. Denies any upper respiratory symptoms. No neck pain or stiffness. No headache. Denies recent travel other than [place]. No close sick contacts. No treatment prior to coming in.  Past Medical History  Diagnosis Date  . Diabetes mellitus   . Cancer     prostate   Past Surgical History  Procedure Laterality Date  . Prostate surgery  2013   No family history on file. History  Substance Use Topics  . Smoking status: Current Every Day Smoker -- 0.33 packs/day    Types: Cigarettes  . Smokeless tobacco: Not on file  . Alcohol Use: Yes     Comment: occassional    Review of Systems  Constitutional: Positive for fever and chills.  Respiratory: Negative for cough, chest tightness and shortness of breath.   Cardiovascular: Negative for chest pain, palpitations and leg swelling.  Gastrointestinal: Positive for nausea and vomiting. Negative for abdominal pain, diarrhea and abdominal distention.  Genitourinary: Negative for dysuria, urgency, frequency, hematuria, flank pain, scrotal swelling and  testicular pain.  Musculoskeletal: Positive for back pain. Negative for myalgias, arthralgias, neck pain and neck stiffness.  Skin: Negative for rash.  Neurological: Positive for weakness. Negative for dizziness, light-headedness, numbness and headaches.  All other systems reviewed and are negative.     Allergies  Codeine  Home Medications   Prior to Admission medications   Medication Sig Start Date End Date Taking? Authorizing Provider  atorvastatin (LIPITOR) 40 MG tablet Take 40 mg by mouth daily.   Yes Historical Provider, MD  HUMALOG KWIKPEN 100 UNIT/ML KiwkPen Inject 16 Units into the skin 2 (two) times daily with a meal. 01/18/15  Yes Historical Provider, MD  LANTUS SOLOSTAR 100 UNIT/ML Solostar Pen Inject 24 Units into the skin at bedtime. 01/18/15  Yes Historical Provider, MD  metFORMIN (GLUCOPHAGE) 500 MG tablet Take 500 mg by mouth 2 (two) times daily with a meal.    Historical Provider, MD   BP 105/62 mmHg  Pulse 106  Temp(Src) 100.9 F (38.3 C) (Oral)  Resp 22  Wt 240 lb 3.2 oz (108.954 kg)  SpO2 99% Physical Exam  Constitutional: He is oriented to person, place, and time. He appears well-developed and well-nourished. No distress.  HENT:  Head: Normocephalic and atraumatic.  Nose: Nose normal.  Mouth/Throat: Oropharynx is clear and moist. No oropharyngeal exudate.  Eyes: Conjunctivae are normal.  Neck: Neck supple.  No meningismus  Cardiovascular: Regular rhythm and normal heart sounds.  Tachycardia present.   Pulmonary/Chest: Effort normal. No respiratory distress. He has no wheezes. He has no rales.  Abdominal: Soft. Bowel sounds are normal. He exhibits no distension. There is  no tenderness. There is no rebound.  No CVA tenderness bilaterally  Musculoskeletal: He exhibits no edema.  Neurological: He is alert and oriented to person, place, and time. No cranial nerve deficit. Coordination normal.  Skin: Skin is warm and dry.  Psychiatric: He has a normal mood and  affect. His behavior is normal.  Nursing note and vitals reviewed.   ED Course  Procedures (including critical care time) Labs Review Labs Reviewed  CBC WITH DIFFERENTIAL/PLATELET - Abnormal; Notable for the following:    RBC 4.01 (*)    Hemoglobin 12.0 (*)    HCT 37.9 (*)    All other components within normal limits  COMPREHENSIVE METABOLIC PANEL - Abnormal; Notable for the following:    Glucose, Bld 295 (*)    Creatinine, Ser 1.71 (*)    AST 107 (*)    ALT 72 (*)    GFR calc non Af Amer 43 (*)    GFR calc Af Amer 50 (*)    All other components within normal limits  LIPASE, BLOOD - Abnormal; Notable for the following:    Lipase 14 (*)    All other components within normal limits  I-STAT CG4 LACTIC ACID, ED - Abnormal; Notable for the following:    Lactic Acid, Venous 3.83 (*)    All other components within normal limits  CULTURE, BLOOD (ROUTINE X 2)  CULTURE, BLOOD (ROUTINE X 2)  URINE CULTURE  URINALYSIS, ROUTINE W REFLEX MICROSCOPIC (NOT AT [facility])    Imaging Review Dg Chest 2 View  03/23/2015   CLINICAL DATA:  Fever, chills since yesterday. Mid back pain. Smoker.  EXAM: CHEST  2 VIEW  COMPARISON:  None.  FINDINGS: Linear densities in the right lung base could reflect scarring or atelectasis. Left lung is clear. Heart is normal size. No effusions. No acute bony abnormality.  IMPRESSION: Right base scarring or atelectasis.  No active disease.   Electronically Signed   By: Rolm Baptise M.D.   On: 03/23/2015 11:51     EKG Interpretation None      MDM   Final diagnoses:  UTI (lower urinary tract infection)  SIRS (systemic inflammatory response syndrome)  Renal insufficiency  Leukocytosis      12:22 PM Pt tachycardic, blood pressure marginal 94/56, pt febrile at 100.9. Code sepsis called. Unknown source. Labs pending. Fluids started. Antibiotics ordered. Will monitor closely  2:27 PM Chest x-ray negative, lactic acid 3.83, white blood cell count 18. Patient is  being resuscitated with IV fluids and antibiotics started for sepsis. Patient received vancomycin and Zosyn for sepsis of unknown origin. Patient has no meningismus. Abdomen is benign. CMP showing worsening creatinine at 1.71. A slightly bumped LFTs. Most likely all from sepsis. Patient is feeling much better with IV fluids. His heart rate is down in the high 90s and blood pressure is now normal.  Spoke with [facility], will admit.   Filed Vitals:   03/23/15 1330 03/23/15 1345 03/23/15 1400 03/23/15 1415  BP: 123/70 96/40 115/65 111/69  Pulse: 100 102 100 99  Temp:      TempSrc:      Resp: 19 22 15 18   Weight:      SpO2: 99% 99% 99% 96%     Jeannett Senior, PA-C 03/24/15 1449  Ernestina Patches, MD 03/25/15 1202

## 2015-03-23 NOTE — ED Notes (Signed)
Per pt, states he was here last night with same symptoms but did not stay-fever, chills and bodyaches

## 2015-03-24 LAB — BASIC METABOLIC PANEL
Anion gap: 7 (ref 5–15)
BUN: 14 mg/dL (ref 6–20)
CHLORIDE: 111 mmol/L (ref 101–111)
CO2: 22 mmol/L (ref 22–32)
Calcium: 7.8 mg/dL — ABNORMAL LOW (ref 8.9–10.3)
Creatinine, Ser: 1.12 mg/dL (ref 0.61–1.24)
GFR calc Af Amer: >60 mL/min (ref >60)
Glucose, Bld: 152 mg/dL — ABNORMAL HIGH (ref 65–99)
POTASSIUM: 3.3 mmol/L — AB (ref 3.5–5.1)
SODIUM: 140 mmol/L (ref 135–145)

## 2015-03-24 LAB — HEPATIC FUNCTION PANEL
ALBUMIN: 2.9 g/dL — AB (ref 3.5–5.0)
ALK PHOS: 64 U/L (ref 38–126)
ALT: 47 U/L (ref 17–63)
AST: 41 U/L (ref 15–41)
BILIRUBIN INDIRECT: 0.7 mg/dL (ref 0.3–0.9)
BILIRUBIN TOTAL: 0.9 mg/dL (ref 0.3–1.2)
Bilirubin, Direct: 0.2 mg/dL (ref 0.1–0.5)
TOTAL PROTEIN: 6.2 g/dL — AB (ref 6.5–8.1)

## 2015-03-24 LAB — CBC WITH DIFFERENTIAL/PLATELET
BASOS ABS: 0 10*3/uL (ref 0.0–0.1)
BASOS PCT: 0 % (ref 0–1)
Eosinophils Absolute: 0 10*3/uL (ref 0.0–0.7)
Eosinophils Relative: 0 % (ref 0–5)
HCT: 34.1 % — ABNORMAL LOW (ref 39.0–52.0)
Hemoglobin: 10.9 g/dL — ABNORMAL LOW (ref 13.0–17.0)
Lymphocytes Relative: 4 % — ABNORMAL LOW (ref 12–46)
Lymphs Abs: 0.6 10*3/uL — ABNORMAL LOW (ref 0.7–4.0)
MCH: 30.3 pg (ref 26.0–34.0)
MCHC: 32 g/dL (ref 30.0–36.0)
MCV: 94.7 fL (ref 78.0–100.0)
MONOS PCT: 3 % (ref 3–12)
Monocytes Absolute: 0.5 10*3/uL (ref 0.1–1.0)
NEUTROS PCT: 93 % — AB (ref 43–77)
Neutro Abs: 14.2 10*3/uL — ABNORMAL HIGH (ref 1.7–7.7)
Platelets: 160 10*3/uL (ref 150–400)
RBC: 3.6 MIL/uL — ABNORMAL LOW (ref 4.22–5.81)
RDW: 13.6 % (ref 11.5–15.5)
WBC: 15.3 10*3/uL — ABNORMAL HIGH (ref 4.0–10.5)

## 2015-03-24 LAB — GLUCOSE, CAPILLARY
GLUCOSE-CAPILLARY: 229 mg/dL — AB (ref 65–99)
GLUCOSE-CAPILLARY: 205 mg/dL — AB (ref 65–99)
Glucose-Capillary: 145 mg/dL — ABNORMAL HIGH (ref 65–99)

## 2015-03-24 LAB — HEMOGLOBIN A1C
Hgb A1c MFr Bld: 12.1 % — ABNORMAL HIGH (ref 4.8–5.6)
Mean Plasma Glucose: 301 mg/dL

## 2015-03-24 MED ORDER — POTASSIUM CHLORIDE CRYS ER 20 MEQ PO TBCR
40.0000 meq | EXTENDED_RELEASE_TABLET | Freq: Once | ORAL | Status: AC
  Administered 2015-03-24: 40 meq via ORAL
  Filled 2015-03-24: qty 2

## 2015-03-24 MED ORDER — IBUPROFEN 200 MG PO TABS
600.0000 mg | ORAL_TABLET | Freq: Once | ORAL | Status: AC
  Administered 2015-03-24: 600 mg via ORAL
  Filled 2015-03-24: qty 3

## 2015-03-24 NOTE — Progress Notes (Signed)
[facility] PROGRESS NOTE  Wyatt Meyer GHW:[id number] DOB: 1961-04-24 DOA: 03/23/2015 PCP: Foye Spurling, MD   Brief narrative 54 year old male with history of uncontrolled type 2 diabetes mellitus on insulin, hyperlipidemia, tobacco use and history of prostate cancer status post resection a few years back (follows  at [facility]) presented to the ED on 6/28 with acute onset of subjective fever with chills, diaphoresis and weakness. Pt returned  home without being evaluated as he had to wait for a long time but return back in next day with worsening symptoms with nausea and vomiting of several episodes, chills and weakness with low back pain. Patient found to be septic in the ED and code sepsis initiated. UA suggestive of UTI with bacteremia.  Assessment/Plan:  Principal problem Sepsis secondary to UTI with bacteremia Sepsis pathway initiated on admission. Resolved with aggressive IV hydration. Leukocytosis improving. Pending urine culture. Blood cultures in both aerobic and anaerobic bottle growing gram variable rods. Would continue vancomycin and Zosyn until final sensitivity available. -Patient clinically much improved, remains afebrile and blood pressure stable.. Will discontinue telemetry and IV fluids.  Acute  kidney injury Secondary to UTI and sepsis. Resolved with IV hydration.  Uncontrolled diabetes mellitus type 2 without complications V8L of 38.1. Patient on adherent with diet, blood glucose monitoring and unclear if he is taking his insulin appropriately. Blood glucose elevated. Increased Lantus dose and added pre-meal aspart. Holding metformin. Counseled on diet adherence, weight loss and medication compliance.  Tobacco abuse Counseled on cessation. Nicotine patch.  Hyperlipidemia Continue Lipitor.  Transaminitis on admission Possibly related to sepsis. Now resolved.  Hypokalemia Replenish   DVT Prophylaxis: Subcutaneous heparin  Diet: Diabetic  Code Status:  Full code Family Communication: Wife and sister at bedside  Disposition Plan: Continue inpatient monitoring. Possibly discharge home as early as 7/2 if cultures and sensitivities available. Will need to retreat for at least 2 weeks   Consultants:  None  Procedures:  None  Antibiotics:  IV vancomycin and Zosyn 6/29--  HPI/Subjective: Since seen and examined. Had a temperature of 103F early this morning. Reports feeling much better today. Denies dysuria or back pain.  Objective: Filed Vitals:   03/24/15 1026  BP: 105/65  Pulse: 95  Temp: 98 F (36.7 C)  Resp:     Intake/Output Summary (Last 24 hours) at 03/24/15 1353 Last data filed at 03/24/15 1027  Gross per 24 hour  Intake 7632.5 ml  Output    700 ml  Net 6932.5 ml   Filed Weights   03/23/15 1149 03/23/15 1514  Weight: 108.954 kg (240 lb 3.2 oz) 110.451 kg (243 lb 8 oz)    Exam:   General:  Middle aged obese male in no acute distress  HEENT: No pallor, moist oral mucosa, supple neck  Chest: Clear to auscultation bilaterally, no sounds  CVS: Normal S1 and S2, no murmurs rub or gallop  GI: Soft, nondistended, nontender, bowel sounds present  Musculoskeletal: Warm, no edema  CNS: Alert and oriented    Data Reviewed: Basic Metabolic Panel:  Recent Labs Lab 03/23/15 0001 03/23/15 1118 03/24/15 0421  NA 139 139 140  K 4.4 4.4 3.3*  CL 104 104 111  CO2 25 22 22   GLUCOSE 248* 295* 152*  BUN 15 18 14   CREATININE 1.29* 1.71* 1.12  CALCIUM 9.6 8.9 7.8*   Liver Function Tests:  Recent Labs Lab 03/23/15 0001 03/23/15 1118 03/24/15 0421  AST 23 107* 41  ALT 21 72* 47  ALKPHOS 98 89  64  BILITOT 0.5 0.7 0.9  PROT 8.5* 7.5 6.2*  ALBUMIN 4.2 3.6 2.9*    Recent Labs Lab 03/23/15 1118  LIPASE 14*   No results for input(s): AMMONIA in the last 168 hours. CBC:  Recent Labs Lab 03/23/15 0001 03/23/15 1118 03/24/15 0421  WBC 7.1 18.0* 15.3*  NEUTROABS 6.0 17.2* 14.2*  HGB 13.0  12.0* 10.9*  HCT 41.3 37.9* 34.1*  MCV 95.6 94.5 94.7  PLT 244 201 160   Cardiac Enzymes: No results for input(s): CKTOTAL, CKMB, CKMBINDEX, TROPONINI in the last 168 hours. BNP (last 3 results) No results for input(s): BNP in the last 8760 hours.  ProBNP (last 3 results) No results for input(s): PROBNP in the last 8760 hours.  CBG:  Recent Labs Lab 03/23/15 1727 03/23/15 2236 03/24/15 0755 03/24/15 1122  GLUCAP 179* 145* 145* 229*    Recent Results (from the past 240 hour(s))  Blood Culture (routine x 2)     Status: None (Preliminary result)   Collection Time: 03/23/15 11:09 AM  Result Value Ref Range Status   Specimen Description BLOOD LEFT ANTECUBITAL  Final   Special Requests BOTTLES DRAWN AEROBIC AND ANAEROBIC 5ML  Final   Culture  Setup Time   Final    GRAM VARIABLE ROD IN BOTH AEROBIC AND ANAEROBIC BOTTLES CRITICAL RESULT CALLED TO, READ BACK BY AND VERIFIED WITH: B. BRALLEY AT  [phone] ON [id number] BY Rhea Bleacher CORRECTED RESULTS CORRECTED RESULTS CALLED TO: B. BRALLEY AT [phone] ON [id number] BY Rhea Bleacher PREVIOUSLY REPORTED AS GRAM POSITIVE COCCI     Culture   Final    GRAM NEGATIVE RODS Performed at [facility]    Report Status PENDING  Incomplete  Blood Culture (routine x 2)     Status: None (Preliminary result)   Collection Time: 03/23/15 12:11 PM  Result Value Ref Range Status   Specimen Description BLOOD LEFT WRIST  Final   Special Requests BOTTLES DRAWN AEROBIC AND ANAEROBIC 3.5ML  Final   Culture  Setup Time   Final    GRAM VARIABLE ROD IN BOTH AEROBIC AND ANAEROBIC BOTTLES CRITICAL RESULT CALLED TO, READ BACK BY AND VERIFIED WITHMirian Mo RN AT [phone] ON [id number] BY Rhea Bleacher Performed at [facility]    Culture PENDING  Incomplete   Report Status PENDING  Incomplete  Urine culture     Status: None (Preliminary result)   Collection Time: 03/23/15 12:54 PM  Result Value Ref Range Status   Specimen Description URINE, CLEAN CATCH  Final    Special Requests NONE  Final   Culture   Final    CULTURE REINCUBATED FOR BETTER GROWTH Performed at [facility]    Report Status PENDING  Incomplete     Studies: Dg Chest 2 View  03/23/2015   CLINICAL DATA:  Fever, chills since yesterday. Mid back pain. Smoker.  EXAM: CHEST  2 VIEW  COMPARISON:  None.  FINDINGS: Linear densities in the right lung base could reflect scarring or atelectasis. Left lung is clear. Heart is normal size. No effusions. No acute bony abnormality.  IMPRESSION: Right base scarring or atelectasis.  No active disease.   Electronically Signed   By: Rolm Baptise M.D.   On: 03/23/2015 11:51    Scheduled Meds: . heparin  5,000 Units Subcutaneous 3 times per day  . insulin aspart  0-15 Units Subcutaneous TID WC  . insulin aspart  0-5 Units Subcutaneous QHS  . insulin aspart  3 Units  Subcutaneous TID WC  . insulin glargine  24 Units Subcutaneous QHS  . nicotine  14 mg Transdermal Daily  . piperacillin-tazobactam (ZOSYN)  IV  3.375 g Intravenous Q8H  . sodium chloride  3 mL Intravenous Q12H  . vancomycin  750 mg Intravenous Q12H   Continuous Infusions:     Time spent: 25 minutes    Starling Jessie  [facility] Pager [phone] If 7PM-7AM, please contact night-coverage at [email or web address], password [facility] 03/24/2015, 1:53 PM  LOS: 1 day

## 2015-03-24 NOTE — Progress Notes (Signed)
CRITICAL VALUE ALERT  Critical value received:  Lactic acid 2.1  Date of notification:  03/23/2015  Time of notification:  1945  Critical value read back:Yes.    Nurse who received alert:  Sabino Gasser, RN  MD notified (1st page):  Lamar Blinks, NP  Time of first page:  2044  MD notified (2nd page):  Time of second page:  Responding MD:  Lamar Blinks, NP  Time MD responded:  2112

## 2015-03-24 NOTE — Progress Notes (Signed)
CRITICAL VALUE ALERT  Critical value received:  4 bottle of blood cultures growing gram variable rods  Date of notification:  03/24/2015  Time of notification:  0719  Critical value read back:Yes.    Nurse who received alert:  Otis Peak  MD notified (1st page):  Dr. Clementeen Graham  Time of first page:  0720  MD notified (2nd page):  Time of second page:  Responding MD:    Time MD responded:

## 2015-03-24 NOTE — Progress Notes (Signed)
CRITICAL VALUE ALERT  Critical value received:  Gram positive cocci in pairs growing in anaerobic blood cultures  Date of notification:  03/23/2015  Time of notification:  2233  Critical value read back:Yes.    Nurse who received alert:  Sabino Gasser, RN  MD notified (1st page):  Lamar Blinks, NP  Time of first page:  2244  MD notified (2nd page):  Time of second page:  Responding MD:  Lamar Blinks, NP  Time MD responded:  [phone]

## 2015-03-24 NOTE — Progress Notes (Signed)
CRITICAL VALUE ALERT  Critical value received:  Gram positive cocci in pairs growing in aerobic blood cultures  Date of notification:  03/23/2015  Time of notification:  2344  Critical value read back:Yes.    Nurse who received alert:  Sabino Gasser, RN  MD notified (1st page):  Lamar Blinks, NP  Time of first page:  2353  MD notified (2nd page):  Time of second page:  Responding MD:    Time MD responded:

## 2015-03-25 DIAGNOSIS — R7881 Bacteremia: Secondary | ICD-10-CM | POA: Diagnosis present

## 2015-03-25 DIAGNOSIS — A414 Sepsis due to anaerobes: Secondary | ICD-10-CM | POA: Diagnosis present

## 2015-03-25 DIAGNOSIS — A4159 Other Gram-negative sepsis: Secondary | ICD-10-CM

## 2015-03-25 HISTORY — DX: Other gram-negative sepsis: A41.59

## 2015-03-25 LAB — CBC
HCT: 35.7 % — ABNORMAL LOW (ref 39.0–52.0)
Hemoglobin: 11.2 g/dL — ABNORMAL LOW (ref 13.0–17.0)
MCH: 29.5 pg (ref 26.0–34.0)
MCHC: 31.4 g/dL (ref 30.0–36.0)
MCV: 93.9 fL (ref 78.0–100.0)
PLATELETS: 151 10*3/uL (ref 150–400)
RBC: 3.8 MIL/uL — AB (ref 4.22–5.81)
RDW: 13.6 % (ref 11.5–15.5)
WBC: 14.5 10*3/uL — ABNORMAL HIGH (ref 4.0–10.5)

## 2015-03-25 LAB — HEPATITIS PANEL, ACUTE
Hep A IgM: NEGATIVE
Hep B C IgM: NEGATIVE
Hepatitis B Surface Ag: NEGATIVE

## 2015-03-25 LAB — URINE CULTURE

## 2015-03-25 LAB — GLUCOSE, CAPILLARY
GLUCOSE-CAPILLARY: 140 mg/dL — AB (ref 65–99)
GLUCOSE-CAPILLARY: 261 mg/dL — AB (ref 65–99)
GLUCOSE-CAPILLARY: 271 mg/dL — AB (ref 65–99)
GLUCOSE-CAPILLARY: 215 mg/dL — AB (ref 65–99)
Glucose-Capillary: 188 mg/dL — ABNORMAL HIGH (ref 65–99)

## 2015-03-25 MED ORDER — INSULIN GLARGINE 100 UNIT/ML ~~LOC~~ SOLN
30.0000 [IU] | Freq: Every day | SUBCUTANEOUS | Status: DC
  Administered 2015-03-25: 30 [IU] via SUBCUTANEOUS
  Filled 2015-03-25: qty 0.3

## 2015-03-25 MED ORDER — INSULIN ASPART 100 UNIT/ML ~~LOC~~ SOLN
6.0000 [IU] | Freq: Three times a day (TID) | SUBCUTANEOUS | Status: DC
  Administered 2015-03-25 – 2015-03-26 (×3): 6 [IU] via SUBCUTANEOUS

## 2015-03-25 NOTE — Progress Notes (Signed)
[facility] PROGRESS NOTE  MARCIN HOLTE OFB:[id number] DOB: 1961-07-02 DOA: 03/23/2015 PCP: Foye Spurling, MD   Brief narrative 54 year old male with history of uncontrolled type 2 diabetes mellitus on insulin, hyperlipidemia, tobacco use and history of prostate cancer status post resection a few years back (follows  at [facility]) presented to the ED on 6/28 with acute onset of subjective fever with chills, diaphoresis and weakness. Pt returned  home without being evaluated as he had to wait for a long time but return back in next day with worsening symptoms with nausea and vomiting of several episodes, chills and weakness with low back pain. Patient found to be septic in the ED and code sepsis initiated. UA suggestive of UTI with bacteremia.  Assessment/Plan:  Principal problem Sepsis secondary to Klebsiella UTI with bacteremia Sepsis pathway initiated on admission. Improving leucocytosis but still having fever spikes.  Pending urine culture growing klebsiella which is mostly sensitive. . Blood cultures in both aerobic and anaerobic bottle growing gram variable rods. continue vancomycin and Zosyn until final sensitivity available. discontinued telemetry and IV fluids. Supportive care with tylenol.  Acute  kidney injury Secondary to UTI and sepsis. Resolved with IV hydration.  Uncontrolled diabetes mellitus type 2 without complications P8E of 42.3. Patient on adherent with diet, blood glucose monitoring and unclear if he is taking his insulin appropriately. Blood glucose elevated. Increased Lantus dose and added pre-meal aspart. ( dose further adjusted today) . Holding metformin. Counseled on diet adherence, weight loss and medication compliance.  Tobacco abuse Counseled on cessation. Nicotine patch.  Hyperlipidemia Continue Lipitor.  Transaminitis on admission Possibly related to sepsis. Now resolved.  Hypokalemia Replenish  hematuria  reports noticing some blood in urine  occasionally  for past few days . UA +. Check renal US.   DVT Prophylaxis: Subcutaneous heparin  Diet: Diabetic  Code Status: Full code Family Communication: Wife at bedside  Disposition Plan: Continue inpatient monitoring. Possibly discharge home as early as 7/2 if cultures and sensitivities available. Will need treat with abx  for at least 2 weeks.   Consultants:  None  Procedures:  None  Antibiotics:  IV vancomycin and Zosyn 6/29--  HPI/Subjective: Since seen and examined. Feels better overall except during fever spike when he has chills and sweats. tmax of 102.92F last evening. Reports noticing some blood in urine.  Objective: Filed Vitals:   03/25/15 0559  BP: 120/71  Pulse: 86  Temp: 97.9 F (36.6 C)  Resp: 16    Intake/Output Summary (Last 24 hours) at 03/25/15 0955 Last data filed at 03/25/15 0429  Gross per 24 hour  Intake 1551.25 ml  Output   1350 ml  Net 201.25 ml   Filed Weights   03/23/15 1149 03/23/15 1514  Weight: 108.954 kg (240 lb 3.2 oz) 110.451 kg (243 lb 8 oz)    Exam:   General:  Middle aged obese male in no acute distress  HEENT: No pallor, moist oral mucosa, supple neck  Chest: Clear to auscultation bilaterally, no sounds  CVS: Normal S1 and S2, no murmurs rub or gallop  GI: Soft, nondistended, nontender, bowel sounds present  Musculoskeletal: Warm, no edema  CNS: Alert and oriented    Data Reviewed: Basic Metabolic Panel:  Recent Labs Lab 03/23/15 0001 03/23/15 1118 03/24/15 0421  NA 139 139 140  K 4.4 4.4 3.3*  CL 104 104 111  CO2 25 22 22   GLUCOSE 248* 295* 152*  BUN 15 18 14   CREATININE 1.29* 1.71* 1.12  CALCIUM 9.6 8.9 7.8*   Liver Function Tests:  Recent Labs Lab 03/23/15 0001 03/23/15 1118 03/24/15 0421  AST 23 107* 41  ALT 21 72* 47  ALKPHOS 98 89 64  BILITOT 0.5 0.7 0.9  PROT 8.5* 7.5 6.2*  ALBUMIN 4.2 3.6 2.9*    Recent Labs Lab 03/23/15 1118  LIPASE 14*   No results for input(s):  AMMONIA in the last 168 hours. CBC:  Recent Labs Lab 03/23/15 0001 03/23/15 1118 03/24/15 0421 03/25/15 0551  WBC 7.1 18.0* 15.3* 14.5*  NEUTROABS 6.0 17.2* 14.2*  --   HGB 13.0 12.0* 10.9* 11.2*  HCT 41.3 37.9* 34.1* 35.7*  MCV 95.6 94.5 94.7 93.9  PLT 244 201 160 151   Cardiac Enzymes: No results for input(s): CKTOTAL, CKMB, CKMBINDEX, TROPONINI in the last 168 hours. BNP (last 3 results) No results for input(s): BNP in the last 8760 hours.  ProBNP (last 3 results) No results for input(s): PROBNP in the last 8760 hours.  CBG:  Recent Labs Lab 03/24/15 0755 03/24/15 1122 03/24/15 1619 03/24/15 2146 03/25/15 0729  GLUCAP 145* 229* 205* 140* 261*    Recent Results (from the past 240 hour(s))  Blood Culture (routine x 2)     Status: None (Preliminary result)   Collection Time: 03/23/15 11:09 AM  Result Value Ref Range Status   Specimen Description BLOOD LEFT ANTECUBITAL  Final   Special Requests BOTTLES DRAWN AEROBIC AND ANAEROBIC 5ML  Final   Culture  Setup Time   Final    GRAM VARIABLE ROD IN BOTH AEROBIC AND ANAEROBIC BOTTLES CRITICAL RESULT CALLED TO, READ BACK BY AND VERIFIED WITH: B. BRALLEY AT  [phone] ON [id number] BY Rhea Bleacher CORRECTED RESULTS CORRECTED RESULTS CALLED TO: B. BRALLEY AT [phone] ON [id number] BY Rhea Bleacher PREVIOUSLY REPORTED AS GRAM POSITIVE COCCI     Culture   Final    GRAM NEGATIVE RODS Performed at [facility]    Report Status PENDING  Incomplete  Blood Culture (routine x 2)     Status: None (Preliminary result)   Collection Time: 03/23/15 12:11 PM  Result Value Ref Range Status   Specimen Description BLOOD LEFT WRIST  Final   Special Requests BOTTLES DRAWN AEROBIC AND ANAEROBIC 3.5ML  Final   Culture  Setup Time   Final    GRAM VARIABLE ROD IN BOTH AEROBIC AND ANAEROBIC BOTTLES CRITICAL RESULT CALLED TO, READ BACK BY AND VERIFIED WITHMirian Mo RN AT [phone] ON [phone] BY Rhea Bleacher    Culture   Final    NO GROWTH 1  DAY Performed at [facility]    Report Status PENDING  Incomplete  Urine culture     Status: None   Collection Time: 03/23/15 12:54 PM  Result Value Ref Range Status   Specimen Description URINE, CLEAN CATCH  Final   Special Requests NONE  Final   Culture   Final    >=100,000 COLONIES/mL KLEBSIELLA PNEUMONIAE Performed at [facility]    Report Status 03/25/2015 FINAL  Final   Organism ID, Bacteria KLEBSIELLA PNEUMONIAE  Final      Susceptibility   Klebsiella pneumoniae - MIC*    AMPICILLIN >=32 RESISTANT Resistant     CEFAZOLIN <=4 SENSITIVE Sensitive     CEFTRIAXONE <=1 SENSITIVE Sensitive     CIPROFLOXACIN <=0.25 SENSITIVE Sensitive     GENTAMICIN <=1 SENSITIVE Sensitive     IMIPENEM <=0.25 SENSITIVE Sensitive     NITROFURANTOIN 64 INTERMEDIATE Intermediate  TRIMETH/SULFA <=20 SENSITIVE Sensitive     AMPICILLIN/SULBACTAM 8 SENSITIVE Sensitive     PIP/TAZO <=4 SENSITIVE Sensitive     * >=100,000 COLONIES/mL KLEBSIELLA PNEUMONIAE     Studies: Dg Chest 2 View  03/23/2015   CLINICAL DATA:  Fever, chills since yesterday. Mid back pain. Smoker.  EXAM: CHEST  2 VIEW  COMPARISON:  None.  FINDINGS: Linear densities in the right lung base could reflect scarring or atelectasis. Left lung is clear. Heart is normal size. No effusions. No acute bony abnormality.  IMPRESSION: Right base scarring or atelectasis.  No active disease.   Electronically Signed   By: Rolm Baptise M.D.   On: 03/23/2015 11:51    Scheduled Meds: . heparin  5,000 Units Subcutaneous 3 times per day  . insulin aspart  0-15 Units Subcutaneous TID WC  . insulin aspart  0-5 Units Subcutaneous QHS  . insulin aspart  3 Units Subcutaneous TID WC  . insulin glargine  24 Units Subcutaneous QHS  . nicotine  14 mg Transdermal Daily  . piperacillin-tazobactam (ZOSYN)  IV  3.375 g Intravenous Q8H  . sodium chloride  3 mL Intravenous Q12H  . vancomycin  750 mg Intravenous Q12H   Continuous Infusions:      Time spent: 25 minutes    Giordana Weinheimer  [facility] Pager [phone] If 7PM-7AM, please contact night-coverage at [email or web address], password [facility] 03/25/2015, 9:55 AM  LOS: 2 days

## 2015-03-26 ENCOUNTER — Inpatient Hospital Stay (HOSPITAL_COMMUNITY): Payer: Self-pay

## 2015-03-26 DIAGNOSIS — E785 Hyperlipidemia, unspecified: Secondary | ICD-10-CM

## 2015-03-26 DIAGNOSIS — A414 Sepsis due to anaerobes: Secondary | ICD-10-CM

## 2015-03-26 DIAGNOSIS — R7881 Bacteremia: Secondary | ICD-10-CM

## 2015-03-26 DIAGNOSIS — N179 Acute kidney failure, unspecified: Secondary | ICD-10-CM

## 2015-03-26 DIAGNOSIS — A419 Sepsis, unspecified organism: Secondary | ICD-10-CM

## 2015-03-26 DIAGNOSIS — B961 Klebsiella pneumoniae [K. pneumoniae] as the cause of diseases classified elsewhere: Secondary | ICD-10-CM

## 2015-03-26 LAB — CBC
HCT: 33.4 % — ABNORMAL LOW (ref 39.0–52.0)
Hemoglobin: 10.5 g/dL — ABNORMAL LOW (ref 13.0–17.0)
MCH: 29.2 pg (ref 26.0–34.0)
MCHC: 31.4 g/dL (ref 30.0–36.0)
MCV: 92.8 fL (ref 78.0–100.0)
Platelets: 156 10*3/uL (ref 150–400)
RBC: 3.6 MIL/uL — ABNORMAL LOW (ref 4.22–5.81)
RDW: 13.5 % (ref 11.5–15.5)
WBC: 9.9 10*3/uL (ref 4.0–10.5)

## 2015-03-26 LAB — CULTURE, BLOOD (ROUTINE X 2)

## 2015-03-26 LAB — GLUCOSE, CAPILLARY: Glucose-Capillary: 186 mg/dL — ABNORMAL HIGH (ref 65–99)

## 2015-03-26 MED ORDER — ACETAMINOPHEN 325 MG PO TABS: 650.0000 mg | ORAL_TABLET | Freq: Four times a day (QID) | ORAL | Status: DC | PRN

## 2015-03-26 MED ORDER — CIPROFLOXACIN HCL 500 MG PO TABS: 500.0000 mg | ORAL_TABLET | Freq: Two times a day (BID) | ORAL | Status: DC

## 2015-03-26 NOTE — Discharge Summary (Signed)
Physician Discharge Summary  Wyatt Meyer YPP:[id number] DOB: 08-13-1961 DOA: 03/23/2015  PCP: Foye Spurling, MD  Admit date: 03/23/2015 Discharge date: 03/26/2015  Recommendations for Outpatient Follow-up:  Continue Cipro on discharge for treatment of Klebsiella urinary tract infection and bacteremia Will repeat blood cultures prior to discharge to ensure clearing of bacteremia [facility] until you establish PCP. Pt reported he currently does not have PCP listed above. Pt will stop taking metformin due to acute infection.   Discharge Diagnoses:  Principal Problem:   Sepsis Active Problems:   UTI (lower urinary tract infection)   Uncontrolled diabetes mellitus type 2 without complications   Acute kidney injury   Obesity   Hyperlipemia   H/O prostate cancer   Bacteremia   Klebsiella sepsis    Discharge Condition: stable   Diet recommendation: as tolerated   History of present illness:  54 year old male with history of uncontrolled type 2 diabetes mellitus on insulin, hyperlipidemia, tobacco use and history of prostate cancer status post resection a few years back (follows at [facility]) who presented to [facility] ED 03/22/2015 with fevers, chills, flank pain mostly in left flank area but intermittently on the right side. He initially thought this was back pain and thought he had pulled a muscle. He also has associated nausea and vomiting. Pt was found to be septic on admission with source of infection UTI and bacteremia due to Klebsiella.    Hospital Course:   Assessment/Plan:  Principal problem Sepsis secondary to Klebsiella UTI and Klebisella bacteremia / Leukocytosis - Sepsis criteria met on admission. Sepsis work up initiated. Pt started on vanco and zosyn on admission. - Blood and urine culture both growing Klebsiella and sensitive to almost all abx except for resistance to ampicillin - Will prescribe cipro for 12 more days on discharge  which would complete 2 week treatment with abx - Pt hemodynamically stable and does not require pressor support - Will obtain blood cultures today to ensure clearing of bacteremia. Pt wants to go home however so we will call him once blood culture results area available.  Active Problems: Acute kidney injury - Secondary to UTI and sepsis.  - Resolved with IV fluids.  Uncontrolled diabetes mellitus type 2 without complications - W5Y on this admission 12.1 indicating poor glycemic control - Will continue insulin regimen per prior home dosing but will not continue metformin due to sepsis, infection - Counseled on compliance with insulin. His wife at the bedside said he is not using insulin as prescribed which is likely why his numbers are out of control.   Tobacco abuse - Counseled on cessation. Nicotine patch in hospital.  Hyperlipidemia - Continue Lipitor.  Transaminitis  - Likely secondary to sepsis - Transient and now resolved   Hypokalemia - Due to sepsis - Resolved   Hematuria - Will obtain renal US prior to discharge   DVT Prophylaxis - Subcutaneous heparin in hospital    Code Status: Full code Family Communication: Wife at bedside    Consultants:  None  Procedures:  None  Antibiotics:  IV vancomycin and Zosyn 6/29 --> 03/26/2015  Cipro on discharge for 12 days.    Signed:  Leisa Lenz, MD  [facility] 03/26/2015, 9:41 AM  Pager #: [phone]  Time spent in minutes: more than 30 minutes    Discharge Exam: Filed Vitals:   03/26/15 0624  BP: 121/81  Pulse: 68  Temp: 98.7 F (37.1 C)  Resp: 18   Filed Vitals:  03/25/15 0559 03/25/15 1400 03/25/15 2034 03/26/15 0624  BP: 120/71 132/74 142/86 121/81  Pulse: 86 89 80 68  Temp: 97.9 F (36.6 C) 97.5 F (36.4 C) 98.6 F (37 C) 98.7 F (37.1 C)  TempSrc: Oral Oral Oral Oral  Resp: _0 Height:      Weight:      SpO2: 100% 99% 100% 96%    General: Pt is alert,  follows commands appropriately, not in acute distress Cardiovascular: Regular rate and rhythm, S1/S2 +, no murmurs Respiratory: Clear to auscultation bilaterally, no wheezing, no crackles, no rhonchi Abdominal: Soft, non tender, non distended, bowel sounds +, no guarding Extremities: no edema, no cyanosis, pulses palpable bilaterally DP and PT Neuro: Grossly nonfocal  Discharge Instructions  Discharge Instructions    Call MD for:  difficulty breathing, headache or visual disturbances    Complete by:  As directed      Call MD for:  hives    Complete by:  As directed      Call MD for:  persistant dizziness or light-headedness    Complete by:  As directed      Call MD for:  persistant nausea and vomiting    Complete by:  As directed      Call MD for:  severe uncontrolled pain    Complete by:  As directed      Diet - low sodium heart healthy    Complete by:  As directed      Discharge instructions    Complete by:  As directed   Continue Cipro on discharge for treatment of Klebsiella urinary tract infection and bacteremia Will repeat blood cultures prior to discharge to ensure clearing of bacteremia [facility] until you establish PCP     Increase activity slowly    Complete by:  As directed             Medication List    STOP taking these medications        metFORMIN 500 MG tablet  Commonly known as:  GLUCOPHAGE      TAKE these medications        acetaminophen 325 MG tablet  Commonly known as:  TYLENOL  Take 2 tablets (650 mg total) by mouth every 6 (six) hours as needed for mild pain (or Fever >/= 101).     atorvastatin 40 MG tablet  Commonly known as:  LIPITOR  Take 40 mg by mouth daily.     ciprofloxacin 500 MG tablet  Commonly known as:  CIPRO  Take 1 tablet (500 mg total) by mouth 2 (two) times daily.     HUMALOG KWIKPEN 100 UNIT/ML KiwkPen  Generic drug:  insulin lispro  Inject 16 Units into the skin 2 (two) times  daily with a meal.     LANTUS SOLOSTAR 100 UNIT/ML Solostar Pen  Generic drug:  Insulin Glargine  Inject 24 Units into the skin at bedtime.           Follow-up Information    Follow up with [facility]    . Schedule an appointment as soon as possible for a visit in 1 week.   Why:  Follow up appt after recent hospitalization   Contact information:   [address] [place] [address] [phone]       The results of significant diagnostics from this hospitalization (including imaging, microbiology, ancillary and laboratory) are listed below  for reference.    Significant Diagnostic Studies: Dg Chest 2 View  03/23/2015   CLINICAL DATA:  Fever, chills since yesterday. Mid back pain. Smoker.  EXAM: CHEST  2 VIEW  COMPARISON:  None.  FINDINGS: Linear densities in the right lung base could reflect scarring or atelectasis. Left lung is clear. Heart is normal size. No effusions. No acute bony abnormality.  IMPRESSION: Right base scarring or atelectasis.  No active disease.   Electronically Signed   By: Rolm Baptise M.D.   On: 03/23/2015 11:51    Microbiology: Recent Results (from the past 240 hour(s))  Blood Culture (routine x 2)     Status: None   Collection Time: 03/23/15 11:09 AM  Result Value Ref Range Status   Specimen Description BLOOD LEFT ANTECUBITAL  Final   Special Requests BOTTLES DRAWN AEROBIC AND ANAEROBIC 5ML  Final   Culture  Setup Time   Final    GRAM VARIABLE ROD IN BOTH AEROBIC AND ANAEROBIC BOTTLES CRITICAL RESULT CALLED TO, READ BACK BY AND VERIFIED WITH: B. BRALLEY AT  [phone] ON [id number] BY Rhea Bleacher CORRECTED RESULTS CORRECTED RESULTS CALLED TO: B. BRALLEY AT [phone] ON [id number] BY Rhea Bleacher PREVIOUSLY REPORTED AS GRAM POSITIVE COCCI     Culture   Final    KLEBSIELLA PNEUMONIAE Performed at [facility]    Report Status 03/26/2015 FINAL  Final   Organism ID, Bacteria KLEBSIELLA PNEUMONIAE  Final       Susceptibility   Klebsiella pneumoniae - MIC*    AMPICILLIN >=32 RESISTANT Resistant     CEFAZOLIN <=4 SENSITIVE Sensitive     CEFEPIME <=1 SENSITIVE Sensitive     CEFTAZIDIME <=1 SENSITIVE Sensitive     CEFTRIAXONE <=1 SENSITIVE Sensitive     CIPROFLOXACIN <=0.25 SENSITIVE Sensitive     GENTAMICIN <=1 SENSITIVE Sensitive     IMIPENEM 0.5 SENSITIVE Sensitive     TRIMETH/SULFA <=20 SENSITIVE Sensitive     AMPICILLIN/SULBACTAM 4 SENSITIVE Sensitive     PIP/TAZO <=4 SENSITIVE Sensitive     * KLEBSIELLA PNEUMONIAE  Blood Culture (routine x 2)     Status: None   Collection Time: 03/23/15 12:11 PM  Result Value Ref Range Status   Specimen Description BLOOD LEFT WRIST  Final   Special Requests BOTTLES DRAWN AEROBIC AND ANAEROBIC 3.5ML  Final   Culture  Setup Time   Final    GRAM VARIABLE ROD IN BOTH AEROBIC AND ANAEROBIC BOTTLES CRITICAL RESULT CALLED TO, READ BACK BY AND VERIFIED WITHMirian Mo RN AT [phone] ON [id number] BY Rhea Bleacher    Culture   Final    KLEBSIELLA PNEUMONIAE Performed at [facility]    Report Status 03/26/2015 FINAL  Final   Organism ID, Bacteria KLEBSIELLA PNEUMONIAE  Final      Susceptibility   Klebsiella pneumoniae - MIC*    AMPICILLIN >=32 RESISTANT Resistant     CEFAZOLIN <=4 SENSITIVE Sensitive     CEFEPIME <=1 SENSITIVE Sensitive     CEFTAZIDIME <=1 SENSITIVE Sensitive     CEFTRIAXONE <=1 SENSITIVE Sensitive     CIPROFLOXACIN <=0.25 SENSITIVE Sensitive     GENTAMICIN <=1 SENSITIVE Sensitive     IMIPENEM 0.5 SENSITIVE Sensitive     TRIMETH/SULFA <=20 SENSITIVE Sensitive     AMPICILLIN/SULBACTAM 8 SENSITIVE Sensitive     PIP/TAZO <=4 SENSITIVE Sensitive     * KLEBSIELLA PNEUMONIAE  Urine culture     Status: None   Collection Time: 03/23/15 12:54 PM  Result  Value Ref Range Status   Specimen Description URINE, CLEAN CATCH  Final   Special Requests NONE  Final   Culture   Final    >=100,000 COLONIES/mL KLEBSIELLA PNEUMONIAE Performed at [facility]    Report Status 03/25/2015 FINAL  Final   Organism ID, Bacteria KLEBSIELLA PNEUMONIAE  Final      Susceptibility   Klebsiella pneumoniae - MIC*    AMPICILLIN >=32 RESISTANT Resistant     CEFAZOLIN <=4 SENSITIVE Sensitive     CEFTRIAXONE <=1 SENSITIVE Sensitive     CIPROFLOXACIN <=0.25 SENSITIVE Sensitive     GENTAMICIN <=1 SENSITIVE Sensitive     IMIPENEM <=0.25 SENSITIVE Sensitive     NITROFURANTOIN 64 INTERMEDIATE Intermediate     TRIMETH/SULFA <=20 SENSITIVE Sensitive     AMPICILLIN/SULBACTAM 8 SENSITIVE Sensitive     PIP/TAZO <=4 SENSITIVE Sensitive     * >=100,000 COLONIES/mL KLEBSIELLA PNEUMONIAE     Labs: Basic Metabolic Panel:  Recent Labs Lab 03/23/15 0001 03/23/15 1118 03/24/15 0421  NA 139 139 140  K 4.4 4.4 3.3*  CL 104 104 111  CO2 _0 GLUCOSE 248* 295* 152*  BUN _1 CREATININE 1.29* 1.71* 1.12  CALCIUM 9.6 8.9 7.8*   Liver Function Tests:  Recent Labs Lab 03/23/15 0001 03/23/15 1118 03/24/15 0421  AST 23 107* 41  ALT 21 72* 47  ALKPHOS 98 89 64  BILITOT 0.5 0.7 0.9  PROT 8.5* 7.5 6.2*  ALBUMIN 4.2 3.6 2.9*    Recent Labs Lab 03/23/15 1118  LIPASE 14*   No results for input(s): AMMONIA in the last 168 hours. CBC:  Recent Labs Lab 03/23/15 0001 03/23/15 1118 03/24/15 0421 03/25/15 0551 03/26/15 0550  WBC 7.1 18.0* 15.3* 14.5* 9.9  NEUTROABS 6.0 17.2* 14.2*  --   --   HGB 13.0 12.0* 10.9* 11.2* 10.5*  HCT 41.3 37.9* 34.1* 35.7* 33.4*  MCV 95.6 94.5 94.7 93.9 92.8  PLT 244 201 160 151 156   Cardiac Enzymes: No results for input(s): CKTOTAL, CKMB, CKMBINDEX, TROPONINI in the last 168 hours. BNP: BNP (last 3 results) No results for input(s): BNP in the last 8760 hours.  ProBNP (last 3 results) No results for input(s): PROBNP in the last 8760 hours.  CBG:  Recent Labs Lab 03/25/15 0729 03/25/15 1148 03/25/15 1644 03/25/15 2133 03/26/15 0720  GLUCAP 261* 271* 215* 188* 186*

## 2015-03-26 NOTE — Progress Notes (Signed)
Completed discharge teaching with patient and family. Answered all questions. Pt will be D/C home with family in stable condition.  Mercy Moore, RN

## 2015-03-26 NOTE — Discharge Instructions (Signed)
Urinary Tract Infection Urinary tract infections (UTIs) can develop anywhere along your urinary tract. Your urinary tract is your body's drainage system for removing wastes and extra water. Your urinary tract includes two kidneys, two ureters, a bladder, and a urethra. Your kidneys are a pair of bean-shaped organs. Each kidney is about the size of your fist. They are located below your ribs, one on each side of your spine. CAUSES Infections are caused by microbes, which are microscopic organisms, including fungi, viruses, and bacteria. These organisms are so small that they can only be seen through a microscope. Bacteria are the microbes that most commonly cause UTIs. SYMPTOMS  Symptoms of UTIs may vary by age and gender of the patient and by the location of the infection. Symptoms in young women typically include a frequent and intense urge to urinate and a painful, burning feeling in the bladder or urethra during urination. Older women and men are more likely to be tired, shaky, and weak and have muscle aches and abdominal pain. A fever may mean the infection is in your kidneys. Other symptoms of a kidney infection include pain in your back or sides below the ribs, nausea, and vomiting. DIAGNOSIS To diagnose a UTI, your caregiver will ask you about your symptoms. Your caregiver also will ask to provide a urine sample. The urine sample will be tested for bacteria and white blood cells. White blood cells are made by your body to help fight infection. TREATMENT  Typically, UTIs can be treated with medication. Because most UTIs are caused by a bacterial infection, they usually can be treated with the use of antibiotics. The choice of antibiotic and length of treatment depend on your symptoms and the type of bacteria causing your infection. HOME CARE INSTRUCTIONS  If you were prescribed antibiotics, take them exactly as your caregiver instructs you. Finish the medication even if you feel better after you  have only taken some of the medication.  Drink enough water and fluids to keep your urine clear or pale yellow.  Avoid caffeine, tea, and carbonated beverages. They tend to irritate your bladder.  Empty your bladder often. Avoid holding urine for long periods of time.  Empty your bladder before and after sexual intercourse.  After a bowel movement, women should cleanse from front to back. Use each tissue only once. SEEK MEDICAL CARE IF:   You have back pain.  You develop a fever.  Your symptoms do not begin to resolve within 3 days. SEEK IMMEDIATE MEDICAL CARE IF:   You have severe back pain or lower abdominal pain.  You develop chills.  You have nausea or vomiting.  You have continued burning or discomfort with urination. MAKE SURE YOU:   Understand these instructions.  Will watch your condition.  Will get help right away if you are not doing well or get worse. Document Released: 06/20/2005 Document Revised: 03/11/2012 Document Reviewed: 10/19/2011 [facility] Patient Information 2015 [place], [place]. This information is not intended to replace advice given to you by your health care provider. Make sure you discuss any questions you have with your health care provider. Bacteremia Bacteremia occurs when bacteria get in your blood. Normal blood does not usually have bacteria. Bacteremia is one way infections can spread from one part of the body to another. CAUSES   Causes may include anything that allows bacteria to get into the body. Examples are:  Catheters.  Intravenous (IV) access tubes.  Cuts or scrapes of the skin.  Temporary bacteremia may occur  during dental procedures, while brushing your teeth, or during a bowel movement. This rarely causes any symptoms or medical problems.  Bacteria may also get in the bloodstream as a complication of a bacterial infection elsewhere. This includes infected wounds and bacterial infections of the:  Lungs  (pneumonia).  Kidneys (pyelonephritis).  Intestines (enteritis, colitis).  Organs in the abdomen (appendicitis, cholecystitis, diverticulitis). SYMPTOMS  The body is usually able to clear small numbers of bacteria out of the blood quickly. Brief bacteremia usually does not cause problems.   Problems can occur if the bacteria start to grow in number or spread to other parts of the body. If the bacteria start growing, you may develop:  Chills.  Fever.  Nausea.  Vomiting.  Sweating.  Lightheadedness and low blood pressure.  Pain.  If bacteria start to grow in the linings around the brain, it is called meningitis. This can cause severe headaches, many other problems, and even death.  If bacteria start to grow in a joint, it causes arthritis with painful joints. If bacteria start to grow in a bone, it is called osteomyelitis.  Bacteria from the blood can also cause sores (abscesses) in many organs, such as the muscle, liver, spleen, lungs, brain, and kidneys. DIAGNOSIS   This condition is diagnosed by cultures of the blood.  Cultures may also be taken from other parts of the body that are thought to be causing the bacteremia. A small piece of tissue, fluid, or other product of the body is sampled. The sample is then put on a growth plate to see if any bacteria grows.  Other lab tests may be done and the results may be abnormal. TREATMENT  Treatment requires a stay in the hospital. You will be given antibiotic medicine through an IV access tube. PREVENTION  People with an increased risk of developing bacteremia or complications may be given antibiotics before certain procedures. Examples are:  A person with a heart murmur or artificial heart valve, before having his or her teeth cleaned.  Before having a surgical or other invasive procedure.  Before having a bowel procedure. Document Released: 06/24/2006 Document Revised: 12/03/2011 Document Reviewed: 04/05/2011 [facility]  Patient Information 2015 [place], [place]. This information is not intended to replace advice given to you by your health care provider. Make sure you discuss any questions you have with your health care provider.

## 2015-03-31 LAB — CULTURE, BLOOD (ROUTINE X 2)
Culture: NO GROWTH
Culture: NO GROWTH

## 2015-09-03 ENCOUNTER — Ambulatory Visit (INDEPENDENT_AMBULATORY_CARE_PROVIDER_SITE_OTHER): Payer: BLUE CROSS/BLUE SHIELD | Admitting: Family Medicine

## 2015-09-03 VITALS — BP 114/62 | HR 100 | Temp 99.2°F | Resp 14 | Ht 73.0 in | Wt 245.0 lb

## 2015-09-03 DIAGNOSIS — E1165 Type 2 diabetes mellitus with hyperglycemia: Secondary | ICD-10-CM

## 2015-09-03 DIAGNOSIS — Z794 Long term (current) use of insulin: Secondary | ICD-10-CM

## 2015-09-03 DIAGNOSIS — IMO0001 Reserved for inherently not codable concepts without codable children: Secondary | ICD-10-CM

## 2015-09-03 LAB — LIPID PANEL
Cholesterol: 288 mg/dL — ABNORMAL HIGH (ref 125–200)
HDL: 34 mg/dL — ABNORMAL LOW (ref >=40)
Total CHOL/HDL Ratio: 8.5 Ratio — ABNORMAL HIGH (ref <=5.0)
Triglycerides: 426 mg/dL — ABNORMAL HIGH (ref <150)

## 2015-09-03 LAB — COMPLETE METABOLIC PANEL WITH GFR
ALT: 14 U/L (ref 9–46)
AST: 16 U/L (ref 10–35)
Albumin: 4.1 g/dL (ref 3.6–5.1)
Alkaline Phosphatase: 93 U/L (ref 40–115)
BUN: 9 mg/dL (ref 7–25)
CO2: 24 mmol/L (ref 20–31)
Calcium: 9.2 mg/dL (ref 8.6–10.3)
Chloride: 101 mmol/L (ref 98–110)
Creat: 0.98 mg/dL (ref 0.70–1.33)
GFR, Est African American: >89 mL/min (ref >=60)
GFR, Est Non African American: 86 mL/min (ref >=60)
Glucose, Bld: 355 mg/dL — ABNORMAL HIGH (ref 65–99)
Potassium: 4.4 mmol/L (ref 3.5–5.3)
Sodium: 134 mmol/L — ABNORMAL LOW (ref 135–146)
Total Bilirubin: 0.3 mg/dL (ref 0.2–1.2)
Total Protein: 7.1 g/dL (ref 6.1–8.1)

## 2015-09-03 LAB — POCT URINALYSIS DIP (MANUAL ENTRY)
Bilirubin, UA: NEGATIVE
Blood, UA: NEGATIVE
Glucose, UA: 500 — AB
Leukocytes, UA: NEGATIVE
Nitrite, UA: NEGATIVE
Spec Grav, UA: 1.02
Urobilinogen, UA: 0.2
pH, UA: 5.5

## 2015-09-03 LAB — HEMOGLOBIN A1C: Hgb A1c MFr Bld: 13.4 % — AB (ref 4.0–6.0)

## 2015-09-03 LAB — POCT GLYCOSYLATED HEMOGLOBIN (HGB A1C): Hemoglobin A1C: 13.4

## 2015-09-03 LAB — MICROALBUMIN, URINE: Microalb, Ur: 1.5 mg/dL

## 2015-09-03 LAB — GLUCOSE, POCT (MANUAL RESULT ENTRY): POC Glucose: 363 mg/dl — AB (ref 70–99)

## 2015-09-03 MED ORDER — CANAGLIFLOZIN 100 MG PO TABS: 100.0000 mg | ORAL_TABLET | Freq: Every day | ORAL | Status: DC

## 2015-09-03 MED ORDER — METFORMIN HCL 850 MG PO TABS: 850.0000 mg | ORAL_TABLET | Freq: Two times a day (BID) | ORAL | Status: DC

## 2015-09-03 NOTE — Progress Notes (Addendum)
This chart was scribed for Robyn Haber, MD by [facility], medical scribe at [facility].The patient was seen in exam room 03 and the patient's care was started at 1:53 PM.  Patient ID: Wyatt Meyer MRN: [id number], DOB: [birth year], 54 y.o. Date of Encounter: 09/03/2015  Primary Physician: Foye Spurling, MD  Chief Complaint:  Chief Complaint  Patient presents with   Diabetes check   Back Pain    Low back pain    pt had urosepsis in June   Fatigue   HPI:  Wyatt Meyer is a 54 y.o. male with a history of diabetes who presents to [facility] for a diabetes recheck. Diabetic for 4-5 years. He has not been compliant with his medications. He has not checked his blood sugar in one month. Occasional tingling in his feet and blurry vision at night. Does not exercise. Sepsis in June, last dose of abx at that time. Prostate cancer, he had his prostate removed due to this. Body aches, due to Lipitor.  He does smoke 2 packs per week. A Art gallery manager and a Pharmacist, hospital.   Past Medical History  Diagnosis Date   Diabetes mellitus    Cancer [facility])     prostate    Home Meds: Prior to Admission medications   Medication Sig Start Date End Date Taking? Authorizing Provider  HUMALOG KWIKPEN 100 UNIT/ML KiwkPen Inject 16 Units into the skin 2 (two) times daily with a meal. 01/18/15  Yes Historical Provider, MD  LANTUS SOLOSTAR 100 UNIT/ML Solostar Pen Inject 24 Units into the skin at bedtime. 01/18/15  Yes Historical Provider, MD  metFORMIN (GLUCOPHAGE) 850 MG tablet Take 850 mg by mouth 2 (two) times daily with a meal.   Yes Historical Provider, MD   Allergies:  Allergies  Allergen Reactions   Codeine Nausea And Vomiting   Social History   Social History   Marital Status: Single    Spouse Name: N/A   Number of Children: N/A   Years of Education: N/A   Occupational History   Not on file.   Social History Main Topics   Smoking  status: Current Every Day Smoker -- 0.33 packs/day    Types: Cigarettes   Smokeless tobacco: Not on file   Alcohol Use: Yes     Comment: occassional   Drug Use: No   Sexual Activity: Not on file   Other Topics Concern   Not on file   Social History Narrative    Review of Systems: Constitutional: negative for chills, fever, night sweats, weight changes, or fatigue  HEENT: negative for vision changes, hearing loss, congestion, rhinorrhea, ST, epistaxis, or sinus pressure Cardiovascular: negative for chest pain or palpitations Respiratory: negative for hemoptysis, wheezing, shortness of breath, or cough Abdominal: negative for abdominal pain, nausea, vomiting, diarrhea, or constipation Dermatological: negative for rash Neurologic: negative for headache, dizziness, or syncope All other systems reviewed and are otherwise negative with the exception to those above and in the HPI.  Physical Exam: Blood pressure 114/62, pulse 100, temperature 99.2 F (37.3 C), temperature source Oral, resp. rate 14, height 6\' 1"  (1.854 m), weight 245 lb (111.131 kg), SpO2 98 %., Body mass index is 32.33 kg/(m^2). General: Well developed, well nourished, in no acute distress. Head: Normocephalic, atraumatic, eyes without discharge, sclera non-icteric, nares are without discharge. Bilateral auditory canals clear, TM's are without perforation, pearly grey and translucent with reflective cone of light bilaterally. Oral cavity moist, posterior  pharynx without exudate, erythema, peritonsillar abscess, or post nasal drip.  Neck: Supple. No thyromegaly. Full ROM. No lymphadenopathy. Lungs: Clear bilaterally to auscultation without wheezes, rales, or rhonchi. Breathing is unlabored. Heart: RRR with S1 S2. No murmurs, rubs, or gallops appreciated. Abdomen: Soft, non-tender, non-distended with normoactive bowel sounds. No hepatomegaly. No rebound/guarding. No obvious abdominal masses. Msk:  Strength and tone normal  for age. Extremities/Skin: Warm and dry. No clubbing or cyanosis. No edema. No rashes or suspicious lesions. Neuro: Alert and oriented X 3. Moves all extremities spontaneously. Gait is normal. CNII-XII grossly in tact. Psych:  Responds to questions appropriately with a normal affect.   Labs: Results for orders placed or performed in visit on 09/03/15  POCT glucose (manual entry)  Result Value Ref Range   POC Glucose 363 (A) 70 - 99 mg/dl  POCT glycosylated hemoglobin (Hb A1C)  Result Value Ref Range   Hemoglobin A1C 13.4   POCT urinalysis dipstick  Result Value Ref Range   Color, UA yellow yellow   Clarity, UA clear clear   Glucose, UA =500 (A) negative   Bilirubin, UA negative negative   Ketones, POC UA trace (5) (A) negative   Spec Grav, UA 1.020    Blood, UA negative negative   pH, UA 5.5    Protein Ur, POC trace (A) negative   Urobilinogen, UA 0.2    Nitrite, UA Negative Negative   Leukocytes, UA Negative Negative    I spent 45 minutes reviewing pathophys, need to quit smoking and need to monitor BS ASSESSMENT AND PLAN:  54 y.o. year old male with  This chart was scribed in my presence and reviewed by me personally.    ICD-9-CM ICD-10-CM   1. Uncontrolled type 2 diabetes mellitus without complication, with long-term current use of insulin (HCC) 250.02 E11.65 POCT glucose (manual entry)   V58.67 Z79.4 POCT glycosylated hemoglobin (Hb A1C)     COMPLETE METABOLIC PANEL WITH GFR     POCT urinalysis dipstick     Lipid panel     Microalbumin, urine     metFORMIN (GLUCOPHAGE) 850 MG tablet     canagliflozin (INVOKANA) 100 MG TABS tablet    Recheck 6 weeks   By signing my name below, I, Nadim Abuhashem, attest that this documentation has been prepared under the direction and in the presence of Robyn Haber, MD.  Electronically Signed: Lora Havens, medical scribe. 09/03/2015 2:16 PM.

## 2015-09-03 NOTE — Patient Instructions (Signed)
I like you to come back in 6 weeks to review how you're doing.

## 2015-09-08 ENCOUNTER — Encounter: Payer: Self-pay | Admitting: Family Medicine

## 2016-01-02 ENCOUNTER — Ambulatory Visit (INDEPENDENT_AMBULATORY_CARE_PROVIDER_SITE_OTHER): Payer: 59 | Admitting: Internal Medicine

## 2016-01-02 VITALS — BP 128/68 | HR 97 | Temp 98.4°F | Resp 16 | Ht 74.0 in | Wt 240.0 lb

## 2016-01-02 DIAGNOSIS — Z794 Long term (current) use of insulin: Secondary | ICD-10-CM | POA: Diagnosis not present

## 2016-01-02 DIAGNOSIS — IMO0001 Reserved for inherently not codable concepts without codable children: Secondary | ICD-10-CM

## 2016-01-02 DIAGNOSIS — M25511 Pain in right shoulder: Secondary | ICD-10-CM | POA: Diagnosis not present

## 2016-01-02 DIAGNOSIS — M25512 Pain in left shoulder: Secondary | ICD-10-CM

## 2016-01-02 DIAGNOSIS — E1165 Type 2 diabetes mellitus with hyperglycemia: Secondary | ICD-10-CM

## 2016-01-02 LAB — GLUCOSE, POCT (MANUAL RESULT ENTRY): POC Glucose: 341 mg/dl — AB (ref 70–99)

## 2016-01-02 LAB — POCT GLYCOSYLATED HEMOGLOBIN (HGB A1C): Hemoglobin A1C: 11.7

## 2016-01-02 MED ORDER — INSULIN GLARGINE 100 UNIT/ML SOLOSTAR PEN: 28.0000 [IU] | PEN_INJECTOR | Freq: Every day | SUBCUTANEOUS | Status: DC

## 2016-01-02 MED ORDER — METFORMIN HCL 1000 MG PO TABS
1000.0000 mg | ORAL_TABLET | Freq: Two times a day (BID) | ORAL | Status: DC
Start: 2016-01-02 — End: 2017-03-24

## 2016-01-02 NOTE — Progress Notes (Signed)
Subjective:  By signing my name below, I, Moises Blood, attest that this documentation has been prepared under the direction and in the presence of Tami Lin, MD. Electronically Signed: Moises Blood, [place]. 01/02/2016 , 2:17 PM .  Patient was seen in Room 8 .   Patient ID: HAYWARD CASS, male    DOB: 29-Aug-1961, 55 y.o.   MRN: [id number] Chief Complaint  Patient presents with  . glucose    pt concern about his level is up   . Shoulder Pain    both, comes and goes    HPI ALEXANDREW ERHART is a 55 y.o. male who presents to [facility] with concerns about his glucose levels.   DM He's been checking his sugar levels at home, ranging about 300-400s. He knows this is out of control. He's currently taking lantus 28 units in the morning and humalog 16 units. He denies taking any metformin at the moment. He was previously taking metformin but was brought off of it going into surgery. He doesn't use his insulin any organized fashion and doesn't check his blood sugar very frequently. He has not followed up with his primary care provider Dr. Carlis Abbott.he saw Dr. Joseph Art in December 2016 and that was his last office visit. At that time his hemoglobin A1c was 13.4, cholesterol 288, and triglycerides 426. He has a long list of needed health maintenance issues particularly related to his diabetes but including colonoscopy. Also immunizations.    Shoulder Pain Patient reports having intermittent bilateral shoulder pain. He's also noticed bumps over his right shoulder, and when he rubs them, he feels a tingling sensation into his arms. Upon further questioning he also has stiffness in his lower extremities particularly his feet and some toenail changes of darkness. There is never any joint swelling or redness. He is relatively sedentary.  Patient Active Problem List   Diagnosis Date Noted  . Bacteremia due to Klebsiella pneumoniae   . Sepsis due to Klebsiella pneumoniae ([place])   . Bacteremia  03/25/2015  . Klebsiella sepsis ([place]) 03/25/2015  . Sepsis ([place]) 03/23/2015  . UTI (lower urinary tract infection) 03/23/2015  . Uncontrolled diabetes mellitus type 2 without complications ([place]) [id number]  . Acute kidney injury ([place]) 03/23/2015  . Obesity 03/23/2015  . Hyperlipemia 03/23/2015  . H/O prostate cancer---he has not had any recent follow-up 03/23/2015     Review of Systems  Constitutional: Negative for fever, chills and fatigue.  Musculoskeletal: Positive for myalgias and arthralgias. Negative for back pain, joint swelling and gait problem.  Skin: Negative for rash and wound.  Neurological: Negative for dizziness, weakness, numbness and headaches.      Objective:   Physical Exam  Constitutional: He is oriented to person, place, and time. He appears well-developed and well-nourished. No distress.  HENT:  Head: Normocephalic and atraumatic.  Eyes: EOM are normal. Pupils are equal, round, and reactive to light.  Neck: Neck supple.  Cardiovascular: Normal rate, regular rhythm and normal heart sounds.   No murmur heard. Pulmonary/Chest: Effort normal and breath sounds normal. No respiratory distress. He has no wheezes.  Musculoskeletal: Normal range of motion.  Both shoulder have good rom with tenderness superiorally with abduction against resistance  Neurological: He is alert and oriented to person, place, and time.  Plantar sensation intact to monofilament  Skin: Skin is warm and dry.  Psychiatric: He has a normal mood and affect. His behavior is normal.  Nursing note and vitals reviewed.  BP 128/68 mmHg  Pulse 97  Temp(Src) 98.4 F (36.9 C) (Oral)  Resp 16  Ht 6\' 2"  (1.88 m)  Wt 240 lb (108.863 kg)  BMI 30.80 kg/m2  SpO2 97%   Results for orders placed or performed in visit on 01/02/16  POCT glucose (manual entry)  Result Value Ref Range   POC Glucose 341 (A) 70 - 99 mg/dl  POCT glycosylated hemoglobin (Hb A1C)  Result Value Ref Range   Hemoglobin A1C  11.7   He prefers not to do any other laboratory this point in fact it probably is unnecessary since he is not made any active interventions    Assessment & Plan:  I have completed the patient encounter in its entirety as documented by the scribe, with editing by me where necessary. Aijalon Demuro P. Laney Pastor, M.D.  Uncontrolled type 2 diabetes mellitus without complication, with long-term current use of insulin ([place]) - Plan: POCT glucose (manual entry), POCT glycosylated hemoglobin (Hb A1C) --we discussed the fact that he will probably lose his eyes and his kidneys in addition to being at great risk for heart attack or stroke feet does not begin to take his disease seriously. He is to stop short acting insulin, stick with her diet, with metformin at full doses, and titrate his Lantus on a weekly basis following morning blood sugars as a guide. He should follow-up in 3 months for repeat labs here to cover everything. He is advised to set up an appointment for his health maintenance issues.  Pain of both shoulder joints--related to uncontrolled diabetes with deconditioning//given an exercise program for shoulders in particular and overall in general and advised to start a physical training program  Meds ordered this encounter  Medications  . metFORMIN (GLUCOPHAGE) 1000 MG tablet    Sig: Take 1 tablet (1,000 mg total) by mouth 2 (two) times daily with a meal.    Dispense:  180 tablet    Refill:  3  . Insulin Glargine (LANTUS SOLOSTAR) 100 UNIT/ML Solostar Pen    Sig: Inject 28 Units into the skin daily at 10 pm.    Dispense:  5 pen    Refill:  PRN   He is offered multiple options follow-up

## 2016-01-02 NOTE — Patient Instructions (Addendum)
Shoulder Range of Motion Exercises Shoulder range of motion (ROM) exercises are designed to keep the shoulder moving freely. They are often recommended for people who have shoulder pain. MOVEMENT EXERCISE When you are able, do this exercise 5-6 days per week, or as told by your health care provider. Work toward doing 2 sets of 10 swings. Pendulum Exercise How To Do This Exercise Lying Down 1. Lie face-down on a bed with your abdomen close to the side of the bed. 2. Let your arm hang over the side of the bed. 3. Relax your shoulder, arm, and hand. 4. Slowly and gently swing your arm forward and back. Do not use your neck muscles to swing your arm. They should be relaxed. If you are struggling to swing your arm, have someone gently swing it for you. When you do this exercise for the first time, swing your arm at a 15 degree angle for 15 seconds, or swing your arm 10 times. As pain lessens over time, increase the angle of the swing to 30-45 degrees. 5. Repeat steps 1-4 with the other arm. How To Do This Exercise While Standing 1. Stand next to a sturdy chair or table and hold on to it with your hand.  Bend forward at the waist.  Bend your knees slightly.  Relax your other arm and let it hang limp.  Relax the shoulder blade of the arm that is hanging and let it drop.  While keeping your shoulder relaxed, use body motion to swing your arm in small circles. The first time you do this exercise, swing your arm for about 30 seconds or 10 times. When you do it next time, swing your arm for a little longer.  Stand up tall and relax.  Repeat steps 1-7, this time changing the direction of the circles. 2. Repeat steps 1-8 with the other arm. STRETCHING EXERCISES Do these exercises 3-4 times per day on 5-6 days per week or as told by your health care provider. Work toward holding the stretch for 20 seconds. Stretching Exercise 1 1. Lift your arm straight out in front of you. 2. Bend your arm 90  degrees at the elbow (right angle) so your forearm goes across your body and looks like the letter "L." 3. Use your other arm to gently pull the elbow forward and across your body. 4. Repeat steps 1-3 with the other arm. Stretching Exercise 2 You will need a towel or rope for this exercise. 1. Bend one arm behind your back with the palm facing outward. 2. Hold a towel with your other hand. 3. Reach the arm that holds the towel above your head, and bend that arm at the elbow. Your wrist should be behind your neck. 4. Use your free hand to grab the free end of the towel. 5. With the higher hand, gently pull the towel up behind you. 6. With the lower hand, pull the towel down behind you. 7. Repeat steps 1-6 with the other arm. STRENGTHENING EXERCISES Do each of these exercises at four different times of day (sessions) every day or as told by your health care provider. To begin with, repeat each exercise 5 times (repetitions). Work toward doing 3 sets of 12 repetitions or as told by your health care provider. Strengthening Exercise 1 You will need a light weight for this activity. As you grow stronger, you may use a heavier weight. 1. Standing with a weight in your hand, lift your arm straight out to the side   until it is at the same height as your shoulder. 2. Bend your arm at 90 degrees so that your fingers are pointing to the ceiling. 3. Slowly raise your hand until your arm is straight up in the air. 4. Repeat steps 1-3 with the other arm. Strengthening Exercise 2 You will need a light weight for this activity. As you grow stronger, you may use a heavier weight. 1. Standing with a weight in your hand, gradually move your straight arm in an arc, starting at your side, then out in front of you, then straight up over your head. 2. Gradually move your other arm in an arc, starting at your side, then out in front of you, then straight up over your head. 3. Repeat steps 1-2 with the other  arm. Strengthening Exercise 3 You will need an elastic band for this activity. As you grow stronger, gradually increase the size of the bands or increase the number of bands that you use at one time. 1. While standing, hold an elastic band in one hand and raise that arm up in the air. 2. With your other hand, pull down the band until that hand is by your side. 3. Repeat steps 1-2 with the other arm.   This information is not intended to replace advice given to you by your health care provider. Make sure you discuss any questions you have with your health care provider.   Document Released: 06/09/2003 Document Revised: 01/25/2015 Document Reviewed: 09/06/2014 Elsevier Interactive Patient Education 2016 [place].      lantus best used at bedtime Check 1st morning sugar--if less than 90 go down on lantus by 4 units-- You should have vision exam this year if you haven't  List of pcps in your area given--call for appt today for routine cpe--  IF you received an x-ray today, you will receive an invoice from [facility]. Please contact [facility] at [phone] with questions or concerns regarding your invoice.   IF you received labwork today, you will receive an invoice from [employer]. Please contact [facility] at [phone] with questions or concerns regarding your invoice.   Our billing staff will not be able to assist you with questions regarding bills from these companies.  You will be contacted with the lab results as soon as they are available. The fastest way to get your results is to activate your My Chart account. Instructions are located on the last page of this paperwork. If you have not heard from [place] regarding the results in 2 weeks, please contact this office.

## 2016-04-04 ENCOUNTER — Ambulatory Visit (INDEPENDENT_AMBULATORY_CARE_PROVIDER_SITE_OTHER): Payer: 59 | Admitting: Urgent Care

## 2016-04-04 ENCOUNTER — Other Ambulatory Visit: Payer: Self-pay | Admitting: Urgent Care

## 2016-04-04 VITALS — BP 138/80 | HR 103 | Temp 97.9°F | Resp 16 | Ht 73.0 in | Wt 242.0 lb

## 2016-04-04 DIAGNOSIS — B351 Tinea unguium: Secondary | ICD-10-CM

## 2016-04-04 DIAGNOSIS — R202 Paresthesia of skin: Secondary | ICD-10-CM | POA: Diagnosis not present

## 2016-04-04 DIAGNOSIS — IMO0001 Reserved for inherently not codable concepts without codable children: Secondary | ICD-10-CM

## 2016-04-04 DIAGNOSIS — E1165 Type 2 diabetes mellitus with hyperglycemia: Secondary | ICD-10-CM

## 2016-04-04 DIAGNOSIS — R200 Anesthesia of skin: Secondary | ICD-10-CM

## 2016-04-04 DIAGNOSIS — Z794 Long term (current) use of insulin: Secondary | ICD-10-CM

## 2016-04-04 LAB — COMPLETE METABOLIC PANEL WITH GFR
ALK PHOS: 122 U/L — AB (ref 40–115)
ALT: 20 U/L (ref 9–46)
AST: 20 U/L (ref 10–35)
Albumin: 4.1 g/dL (ref 3.6–5.1)
BUN: 13 mg/dL (ref 7–25)
CO2: 21 mmol/L (ref 20–31)
Calcium: 9.4 mg/dL (ref 8.6–10.3)
Chloride: 105 mmol/L (ref 98–110)
Creat: 1.12 mg/dL (ref 0.70–1.33)
GFR, Est African American: 84 mL/min (ref >=60)
GFR, Est Non African American: 73 mL/min (ref >=60)
Glucose, Bld: 316 mg/dL — ABNORMAL HIGH (ref 65–99)
POTASSIUM: 4.6 mmol/L (ref 3.5–5.3)
Sodium: 139 mmol/L (ref 135–146)
Total Bilirubin: 0.3 mg/dL (ref 0.2–1.2)
Total Protein: 7.3 g/dL (ref 6.1–8.1)

## 2016-04-04 LAB — POCT GLYCOSYLATED HEMOGLOBIN (HGB A1C): Hemoglobin A1C: >14

## 2016-04-04 MED ORDER — INSULIN GLARGINE 100 UNIT/ML SOLOSTAR PEN: 28.0000 [IU] | PEN_INJECTOR | Freq: Every day | SUBCUTANEOUS | Status: DC

## 2016-04-04 MED ORDER — INSULIN LISPRO 100 UNIT/ML (KWIKPEN): 15.0000 [IU] | PEN_INJECTOR | Freq: Three times a day (TID) | SUBCUTANEOUS | Status: DC

## 2016-04-04 MED ORDER — INSULIN LISPRO 100 UNIT/ML ~~LOC~~ SOLN: 15.0000 [IU] | Freq: Three times a day (TID) | SUBCUTANEOUS | Status: DC

## 2016-04-04 NOTE — Patient Instructions (Addendum)
Diabetes Mellitus and Food It is important for you to manage your blood sugar (glucose) level. Your blood glucose level can be greatly affected by what you eat. Eating healthier foods in the appropriate amounts throughout the day at about the same time each day will help you control your blood glucose level. It can also help slow or prevent worsening of your diabetes mellitus. Healthy eating may even help you improve the level of your blood pressure and reach or maintain a healthy weight.  General recommendations for healthful eating and cooking habits include:  Eating meals and snacks regularly. Avoid going long periods of time without eating to lose weight.  Eating a diet that consists mainly of plant-based foods, such as fruits, vegetables, nuts, legumes, and whole grains.  Using low-heat cooking methods, such as baking, instead of high-heat cooking methods, such as deep frying. Work with your dietitian to make sure you understand how to use the Nutrition Facts information on food labels. HOW CAN FOOD AFFECT ME? Carbohydrates Carbohydrates affect your blood glucose level more than any other type of food. Your dietitian will help you determine how many carbohydrates to eat at each meal and teach you how to count carbohydrates. Counting carbohydrates is important to keep your blood glucose at a healthy level, especially if you are using insulin or taking certain medicines for diabetes mellitus. Alcohol Alcohol can cause sudden decreases in blood glucose (hypoglycemia), especially if you use insulin or take certain medicines for diabetes mellitus. Hypoglycemia can be a life-threatening condition. Symptoms of hypoglycemia (sleepiness, dizziness, and disorientation) are similar to symptoms of having too much alcohol.  If your health care provider has given you approval to drink alcohol, do so in moderation and use the following guidelines:  Women should not have more than one drink per day, and men  should not have more than two drinks per day. One drink is equal to:  12 oz of beer.  5 oz of wine.  1 oz of hard liquor.  Do not drink on an empty stomach.  Keep yourself hydrated. Have water, diet soda, or unsweetened iced tea.  Regular soda, juice, and other mixers might contain a lot of carbohydrates and should be counted. WHAT FOODS ARE NOT RECOMMENDED? As you make food choices, it is important to remember that all foods are not the same. Some foods have fewer nutrients per serving than other foods, even though they might have the same number of calories or carbohydrates. It is difficult to get your body what it needs when you eat foods with fewer nutrients. Examples of foods that you should avoid that are high in calories and carbohydrates but low in nutrients include:  Trans fats (most processed foods list trans fats on the Nutrition Facts label).  Regular soda.  Juice.  Candy.  Sweets, such as cake, pie, doughnuts, and cookies.  Fried foods. WHAT FOODS CAN I EAT? Eat nutrient-rich foods, which will nourish your body and keep you healthy. The food you should eat also will depend on several factors, including:  The calories you need.  The medicines you take.  Your weight.  Your blood glucose level.  Your blood pressure level.  Your cholesterol level. You should eat a variety of foods, including:  Protein.  Lean cuts of meat.  Proteins low in saturated fats, such as fish, egg whites, and beans. Avoid processed meats.  Fruits and vegetables.  Fruits and vegetables that may help control blood glucose levels, such as apples, mangoes, and  yams.  Dairy products.  Choose fat-free or low-fat dairy products, such as milk, yogurt, and cheese.  Grains, bread, pasta, and rice.  Choose whole grain products, such as multigrain bread, whole oats, and brown rice. These foods may help control blood pressure.  Fats.  Foods containing healthful fats, such as nuts,  avocado, olive oil, canola oil, and fish. DOES EVERYONE WITH DIABETES MELLITUS HAVE THE SAME MEAL PLAN? Because every person with diabetes mellitus is different, there is not one meal plan that works for everyone. It is very important that you meet with a dietitian who will help you create a meal plan that is just right for you.   This information is not intended to replace advice given to you by your health care provider. Make sure you discuss any questions you have with your health care provider.   Document Released: 06/07/2005 Document Revised: 10/01/2014 Document Reviewed: 08/07/2013 Elsevier Interactive Patient Education 2016 Elsevier Inc.     Dapagliflozin; Metformin extended-release tablets What is this medicine? DAPAGLIFLOZIN; METFORMIN (DAP a gli FLOE zin; met FOR min) is a combination of 2 medicines used to treat type 2 diabetes. This medicine lowers blood sugar. Treatment is combined with a balanced diet and exercise. This medicine may be used for other purposes; ask your health care provider or pharmacist if you have questions. What should I tell my health care provider before I take this medicine? They need to know if you have any of these conditions: -anemia -dehydration -diabetic ketoacidosis -diet low in salt -eating less due to illness, surgery, dieting, or any other reason -having surgery -heart disease -high cholesterol -history of pancreatitis or pancreas problems -history of yeast infection of the penis or vagina -if you often drink alcohol -infections in the bladder, kidneys, or urinary tract -kidney disease -liver disease -low blood pressure -polycystic ovary syndrome -problems urinating -serious infection or injury -type 1 diabetes -uncircumcised male -vomiting -an unusual or allergic reaction to dapagliflozin, metformin, other medicines, foods, dyes, or preservatives -pregnant or trying to get pregnant -breast-feeding How should I use this  medicine? Take this medicine by mouth with a glass of water. Take this medicine in the morning with food. Follow the directions on the prescription label. Do not cut, crush, or chew this medicine. Take your doses at regular intervals. Do not take your medicine more often than directed. Do not stop taking except on your doctor's advice. A special MedGuide will be given to you by the pharmacist with each prescription and refill. Be sure to read this information carefully each time. Talk to your pediatrician regarding the use of this medicine in children. Special care may be needed. Overdosage: If you think you have taken too much of this medicine contact a poison control center or emergency room at once. NOTE: This medicine is only for you. Do not share this medicine with others. What if I miss a dose? If you miss a dose, take it as soon as you can. If it is almost time for your next dose, take only that dose. Do not take double or extra doses. What may interact with this medicine? Do not take this medicine with any of the following medications: -dofetilide -gatifloxacin -certain contrast medicines given before X-rays, CT scans, MRI, or other procedures This medicine may also interact with the following medications: -acetazolamide -alcohol -certain antiviral medicines for HIV infection or hepatitis -cimetidine -crizotinib -digoxin -diuretics -male hormones, like estrogens or progestins and birth control pills -glycopyrrolate -isoniazid -lamotrigine -medicines for blood   pressure, heart disease, irregular heart beat -memantine -methazolamide -midodrine -morphine -nicotinic acid -phenobarbital -phenothiazines like chlorpromazine, mesoridazine, prochlorperazine, thioridazine -phenytoin -procainamide -propantheline -quinidine -quinine -ranitidine -ranolazine -rifampin -ritonavir -steroid medicines like prednisone or cortisone -stimulant medicines for attention disorders, weight  loss, or to stay awake -thyroid medicines -topiramate -trimethoprim -trospium -vancomycin -vandetanib -zonisamide This list may not describe all possible interactions. Give your health care provider a list of all the medicines, herbs, non-prescription drugs, or dietary supplements you use. Also tell them if you smoke, drink alcohol, or use illegal drugs. Some items may interact with your medicine. What should I watch for while using this medicine? Visit your doctor or health care professional for regular checks on your progress. This medicine can cause a serious condition in which there is too much acid in the blood. If you develop nausea, vomiting, stomach pain, unusual tiredness, or breathing problems, stop taking this medicine and call your doctor right away. If possible, use a ketone dipstick to check for ketones in your urine. A test called the HbA1C (A1C) will be monitored. This is a simple blood test. It measures your blood sugar control over the last 2 to 3 months. You will receive this test every 3 to 6 months. Learn how to check your blood sugar. Learn the symptoms of low and high blood sugar and how to manage them. Always carry a quick-source of sugar with you in case you have symptoms of low blood sugar. Examples include hard sugar candy or glucose tablets. Make sure others know that you can choke if you eat or drink when you develop serious symptoms of low blood sugar, such as seizures or unconsciousness. They must get medical help at once. Tell your doctor or health care professional if you have high blood sugar. You might need to change the dose of your medicine. If you are sick or exercising more than usual, you might need to change the dose of your medicine. Do not skip meals. Ask your doctor or health care professional if you should avoid alcohol. Many nonprescription cough and cold products contain sugar or alcohol. These can affect blood sugar. This medicine may cause ovulation  in premenopausal women who do not have regular monthly periods. This may increase your chances of becoming pregnant. You should not take this medicine if you become pregnant or think you may be pregnant. Talk with your doctor or health care professional about your birth control options while taking this medicine. Contact your doctor or health care professional right away if think you are pregnant. If you are going to need surgery, a MRI, CT scan, or other procedure, tell your doctor that you are taking this medicine. You may need to stop taking this medicine before the procedure. Wear a medical ID bracelet or chain, and carry a card that describes your disease and details of your medicine and dosage times. You may see empty tablets in your stool. This is normal. What side effects may I notice from receiving this medicine? Side effects that you should report to your doctor or health care professional as soon as possible: -allergic reactions like skin rash, itching or hives, swelling of the face, lips, or tongue -breathing problems -dizziness -feeling faint or lightheaded, falls -muscle aches or pains -muscle weakness -nausea, vomiting, unusual stomach upset or pain -signs and symptoms of low blood sugar such as feeling anxious, confusion, dizziness, increased hunger, unusually weak or tired, sweating, shakiness, cold, irritable, headache, blurred vision, fast heartbeat, loss of consciousness -signs and   of a urinary tract infection, such as fever, chills, a burning feeling when urinating, blood in the urine, back pain -trouble passing urine or change in the amount of urine, including an urgent need to urinate more often, in larger amounts, or at night -penile discharge, itching, or pain in men -slow, irregular heartbeat -unusually tired or weak -vaginal discharge, itching, or odor in women Side effects that usually do not require medical attention (Report these to your doctor or health  care professional if they continue or are bothersome.): -constipation -diarrhea -headache -heartburn -mild increase in urination -stomach gas -stuffy or runny nose -sore throat -thirsty This list may not describe all possible side effects. Call your doctor for medical advice about side effects. You may report side effects to FDA at 1-800-FDA-1088. Where should I keep my medicine? Keep out of the reach of children. Store at room temperature between 15 and 30 degrees C (59 and 86 degrees F). Throw away any unused medicine after the expiration date. NOTE: This sheet is a summary. It may not cover all possible information. If you have questions about this medicine, talk to your doctor, pharmacist, or health care provider.    2016, Elsevier/Gold Standard. (2014-08-31 11:22:26)     IF you received an x-ray today, you will receive an invoice from [facility]. Please contact [facility] Radiology at [phone] with questions or concerns regarding your invoice.   IF you received labwork today, you will receive an invoice from [employer]. Please contact [facility] at [phone] with questions or concerns regarding your invoice.   Our billing staff will not be able to assist you with questions regarding bills from these companies.  You will be contacted with the lab results as soon as they are available. The fastest way to get your results is to activate your My Chart account. Instructions are located on the last page of this paperwork. If you have not heard from [place] regarding the results in 2 weeks, please contact this office.

## 2016-04-04 NOTE — Progress Notes (Signed)
   MRN: [id number]  Subjective:   Wyatt Meyer is a 55 y.o. male who presents for follow up of Type 2 Diabetes Mellitus.   Diagnosis was made 2013. Patient is currently managed with insulin long-acting and short-acting, metformin. Admits non-compliance with insulin. Takes this very inconsistently, hardly ever uses night time insulin. Denies adverse effects including metallic taste, nausea, vomiting.   Patient is checking home blood sugars. Home blood sugar is generally 300-400's. Reports intermittent polydipsia, numbness and tingling, changes in both big toe nails. Also has Patient denies blurred vision, polydipsia, chest pain, nausea, vomiting, abdominal pain, hematuria, polyuria, skin infections. Patient is not checking their feet daily. Last diabetic eye exam eye exam was within the past year, just had a prescription for eye glasses filled. Diet is generally healthy, has occasional dessert. Patient is not exercising.  Known diabetic complications: none  Immunizations: TDAP up to date 05/13/2008, declines all immunizations.  Objective:   PHYSICAL EXAM BP 138/80 mmHg  Pulse 103  Temp(Src) 97.9 F (36.6 C) (Oral)  Resp 16  Ht 6\' 1"  (1.854 m)  Wt 242 lb (109.77 kg)  BMI 31.93 kg/m2  SpO2 98%  Physical Exam  Constitutional: He is oriented to person, place, and time. He appears well-developed and well-nourished.  HENT:  Mouth/Throat: Oropharynx is clear and moist.  Eyes: No scleral icterus.  Cardiovascular: Normal rate, regular rhythm and intact distal pulses.  Exam reveals no gallop and no friction rub.   No murmur heard. Pulmonary/Chest: No respiratory distress. He has no wheezes. He has no rales.  Neurological: He is alert and oriented to person, place, and time.  Skin: Skin is warm and dry.   Results for orders placed or performed in visit on 04/04/16 (from the past 24 hour(s))  POCT glycosylated hemoglobin (Hb A1C)     Status: None   Collection Time: 04/04/16 10:56 AM    Result Value Ref Range   Hemoglobin A1C >14.0    Assessment and Plan :   1. Uncontrolled type 2 diabetes mellitus without complication, with long-term current use of insulin ([place]) - Patient has uncontrolled diabetes due to medical non-compliance. He is refusing changes to his medication regimen and any immunizations. He agreed to try following a strict regimen for his insulin, will start additional medications in 3 months if no improvement.  2. Toenail fungus - Patient will try otc cream, f/u in 3 months.  3. Numbness and tingling - Will hold off on starting medications at patient's request.  Jaynee Eagles, PA-C [facility] [phone] 04/04/2016 10:37 AM

## 2016-04-07 ENCOUNTER — Telehealth: Payer: Self-pay | Admitting: *Deleted

## 2016-04-07 NOTE — Telephone Encounter (Signed)
Yes, please! He needs insulin. Thank you for your help! I appreciate it.

## 2016-04-07 NOTE — Telephone Encounter (Signed)
Received an rx from pharmacy ins will not cover lantus but will cover [place].  Can we send a new rx to [facility].

## 2016-04-10 MED ORDER — BASAGLAR KWIKPEN 100 UNIT/ML ~~LOC~~ SOPN: PEN_INJECTOR | SUBCUTANEOUS | Status: DC

## 2016-04-10 NOTE — Telephone Encounter (Signed)
Rx sent 

## 2016-04-11 ENCOUNTER — Other Ambulatory Visit: Payer: Self-pay | Admitting: Urgent Care

## 2016-05-03 ENCOUNTER — Ambulatory Visit (INDEPENDENT_AMBULATORY_CARE_PROVIDER_SITE_OTHER): Payer: 59 | Admitting: Physician Assistant

## 2016-05-03 VITALS — BP 122/74 | HR 95 | Temp 98.8°F | Resp 16 | Ht 73.0 in | Wt 245.0 lb

## 2016-05-03 DIAGNOSIS — N390 Urinary tract infection, site not specified: Secondary | ICD-10-CM

## 2016-05-03 DIAGNOSIS — M545 Low back pain, unspecified: Secondary | ICD-10-CM

## 2016-05-03 DIAGNOSIS — IMO0001 Reserved for inherently not codable concepts without codable children: Secondary | ICD-10-CM

## 2016-05-03 DIAGNOSIS — E1165 Type 2 diabetes mellitus with hyperglycemia: Secondary | ICD-10-CM | POA: Diagnosis not present

## 2016-05-03 DIAGNOSIS — N3001 Acute cystitis with hematuria: Secondary | ICD-10-CM

## 2016-05-03 DIAGNOSIS — R82998 Other abnormal findings in urine: Secondary | ICD-10-CM

## 2016-05-03 DIAGNOSIS — R509 Fever, unspecified: Secondary | ICD-10-CM

## 2016-05-03 LAB — POCT CBC
Granulocyte percent: 78.6 %G (ref 37–80)
HEMATOCRIT: 40.2 % — AB (ref 43.5–53.7)
HEMOGLOBIN: 13.7 g/dL — AB (ref 14.1–18.1)
LYMPH, POC: 1.7 (ref 0.6–3.4)
MCH: 31 pg (ref 27–31.2)
MCHC: 34.1 g/dL (ref 31.8–35.4)
MCV: 90.9 fL (ref 80–97)
MID (cbc): 0.5 (ref 0–0.9)
MPV: 8.7 fL (ref 0–99.8)
POC GRANULOCYTE: 8 — AB (ref 2–6.9)
POC LYMPH PERCENT: 16.8 %L (ref 10–50)
POC MID %: 4.6 %M (ref 0–12)
Platelet Count, POC: 218 10*3/uL (ref 142–424)
RBC: 4.42 M/uL — AB (ref 4.69–6.13)
RDW, POC: 14.4 %
WBC: 10.2 10*3/uL (ref 4.6–10.2)

## 2016-05-03 LAB — POCT URINALYSIS DIP (MANUAL ENTRY)
BILIRUBIN UA: NEGATIVE
Bilirubin, UA: NEGATIVE
Glucose, UA: >=1000 — AB
Nitrite, UA: POSITIVE — AB
SPEC GRAV UA: 1.025
Urobilinogen, UA: 0.2
pH, UA: 5.5

## 2016-05-03 LAB — POC MICROSCOPIC URINALYSIS (UMFC): MUCUS RE: ABSENT

## 2016-05-03 LAB — GLUCOSE, POCT (MANUAL RESULT ENTRY): POC Glucose: 182 mg/dl — AB (ref 70–99)

## 2016-05-03 LAB — POCT INFLUENZA A/B
Influenza A, POC: NEGATIVE
Influenza B, POC: NEGATIVE

## 2016-05-03 MED ORDER — CIPROFLOXACIN HCL 500 MG PO TABS
500.0000 mg | ORAL_TABLET | Freq: Two times a day (BID) | ORAL | 0 refills | Status: DC
Start: 2016-05-03 — End: 2016-06-05

## 2016-05-03 NOTE — Progress Notes (Signed)
Subjective:    Patient ID: Wyatt Meyer, male    DOB: 03/29/1961, 55 y.o.   MRN: [id number]  HPI  Patient presents today for fever, chills, sweats, fatigue, anorexia and myalgias. His fever was 101 when he checked it last night, and he has been feeling bad for 2 days now. He took some advil last night and his fever is now improved. He is also c/o low back pain bilaterally that has been occurring intermittently for a few weeks, describes this pain as dull. He feels as though his urine may be a little more concentrated and that he is urinating less frequently. He denies any dysuria, penile discharge, abdominal pain, or flank pain. In July of 2016, he was admitted to the hospital for urosepsis with Klebsiella, was treated with Cipro.   The patient is also an uncontrolled type 2 diabetic, he does not take his medications regularly. His blood glucose has been running in the 400s and 300s for a few weeks and were in the 400s yesterday. When he does take his medication, he runs in the 180s-190s. His last A1c on 04/04/2016 was > 14.0.   Review of Systems  Constitutional: Positive for chills and fever.  Gastrointestinal: Negative for abdominal pain, constipation, diarrhea, nausea and vomiting.  Genitourinary: Positive for frequency (maybe slilghtly decreased). Negative for dysuria.  Musculoskeletal: Positive for back pain.  Neurological: Negative for light-headedness.  All other systems reviewed and are negative.   Current Outpatient Prescriptions:  .  BD PEN NEEDLE NANO U/F 32G X 4 MM MISC, use four times a day, Disp: 120 each, Rfl: 5 .  Insulin Glargine (BASAGLAR KWIKPEN) 100 UNIT/ML SOPN, Inject 28 Units into the skin daily at 10 pm., Disp: 5 pen, Rfl: 6 .  insulin lispro (HUMALOG KWIKPEN) 100 UNIT/ML KiwkPen, Inject 0.15 mLs (15 Units total) into the skin 3 (three) times daily., Disp: 15 mL, Rfl: 11 .  metFORMIN (GLUCOPHAGE) 1000 MG tablet, Take 1 tablet (1,000 mg total) by mouth 2 (two) times  daily with a meal., Disp: 180 tablet, Rfl: 3   Allergies  Allergen Reactions  . Codeine Nausea And Vomiting      Objective:   Physical Exam  Constitutional: He is oriented to person, place, and time. He appears well-developed and well-nourished. He is cooperative. No distress.  Blood pressure 122/74, pulse 95, temperature 98.8 F (37.1 C), temperature source Oral, resp. rate 16, height 6\' 1"  (1.854 m), weight 245 lb (111.1 kg), SpO2 99 %. Patient is sitting in room wrapped in blanket, does not appear like he feels well.  HENT:  Head: Normocephalic and atraumatic.  Eyes: Conjunctivae and lids are normal. No scleral icterus.  Neck: Trachea normal and normal range of motion. Neck supple.  Cardiovascular: Normal rate, regular rhythm, normal heart sounds and intact distal pulses.   Pulmonary/Chest: Effort normal and breath sounds normal.  Abdominal: Soft. Bowel sounds are normal. He exhibits no distension. There is no tenderness. There is no CVA tenderness.  Musculoskeletal: Normal range of motion.       Lumbar back: Normal.  Lymphadenopathy:    He has no cervical adenopathy.       Right: No supraclavicular adenopathy present.       Left: No supraclavicular adenopathy present.  Neurological: He is alert and oriented to person, place, and time.  Skin: Skin is warm, dry and intact. He is not diaphoretic. No pallor.  Psychiatric: He has a normal mood and affect. His speech is normal and  behavior is normal.   Results for orders placed or performed in visit on 05/03/16  POCT Microscopic Urinalysis ([facility])  Result Value Ref Range   WBC,UR,HPF,POC Too numerous to count  (A) None WBC/hpf   RBC,UR,HPF,POC Few (A) None RBC/hpf   Bacteria Many (A) None, Too numerous to count   Mucus Absent Absent   Epithelial Cells, UR Per Microscopy None None, Too numerous to count cells/hpf  POCT urinalysis dipstick  Result Value Ref Range   Color, UA yellow yellow   Clarity, UA clear clear   Glucose, UA  >=1,000 (A) negative   Bilirubin, UA negative negative   Ketones, POC UA negative negative   Spec Grav, UA 1.025    Blood, UA moderate (A) negative   pH, UA 5.5    Protein Ur, POC =30 (A) negative   Urobilinogen, UA 0.2    Nitrite, UA Positive (A) Negative   Leukocytes, UA Trace (A) Negative  POCT glucose (manual entry)  Result Value Ref Range   POC Glucose 182 (A) 70 - 99 mg/dl  POCT Influenza A/B  Result Value Ref Range   Influenza A, POC Negative Negative   Influenza B, POC Negative Negative  POCT CBC  Result Value Ref Range   WBC 10.2 4.6 - 10.2 K/uL   Lymph, poc 1.7 0.6 - 3.4   POC LYMPH PERCENT 16.8 10 - 50 %L   MID (cbc) 0.5 0 - 0.9   POC MID % 4.6 0 - 12 %M   POC Granulocyte 8.0 (A) 2 - 6.9   Granulocyte percent 78.6 37 - 80 %G   RBC 4.42 (A) 4.69 - 6.13 M/uL   Hemoglobin 13.7 (A) 14.1 - 18.1 g/dL   HCT, POC 40.2 (A) 43.5 - 53.7 %   MCV 90.9 80 - 97 fL   MCH, POC 31.0 27 - 31.2 pg   MCHC 34.1 31.8 - 35.4 g/dL   RDW, POC 14.4 %   Platelet Count, POC 218 142 - 424 K/uL   MPV 8.7 0 - 99.8 fL      Assessment & Plan:    Uncontrolled diabetes mellitus type 2 without complications, unspecified long term insulin use status ([facility]) - Plan: POCT glucose (manual entry) discussed with patient the importance of good DM control - encouraged him to use his medication as Rx to prevent long term effects of DM. Discussed decrease immune system as a result of uncontrolled DM.  Leukocytes in urine - Plan: Urine culture  Acute cystitis with hematuria - Plan: ciprofloxacin (CIPRO) 500 MG tablet - treat for UTI - antipyretics to help with fever - normal CBC at this time - he will RTC if he worsens in the next 24h otherwise finish all of abx - increase his fluids  Windell Hummingbird PA-C  [facility] 05/04/2016 7:40 PM

## 2016-05-03 NOTE — Patient Instructions (Addendum)
Drink fluids Take your medications for your diabetes Recheck if you are not better in 48h otherwise no f/u is needed    IF you received an x-ray today, you will receive an invoice from [facility] Radiology. Please contact [facility] Radiology at [phone] with questions or concerns regarding your invoice.   IF you received labwork today, you will receive an invoice from [employer]. Please contact [facility] at [phone] with questions or concerns regarding your invoice.   Our billing staff will not be able to assist you with questions regarding bills from these companies.  You will be contacted with the lab results as soon as they are available. The fastest way to get your results is to activate your My Chart account. Instructions are located on the last page of this paperwork. If you have not heard from [place] regarding the results in 2 weeks, please contact this office.

## 2016-05-05 LAB — URINE CULTURE

## 2016-05-10 ENCOUNTER — Encounter: Payer: Self-pay | Admitting: Podiatry

## 2016-05-10 ENCOUNTER — Ambulatory Visit (INDEPENDENT_AMBULATORY_CARE_PROVIDER_SITE_OTHER): Payer: 59

## 2016-05-10 ENCOUNTER — Ambulatory Visit (INDEPENDENT_AMBULATORY_CARE_PROVIDER_SITE_OTHER): Payer: 59 | Admitting: Podiatry

## 2016-05-10 VITALS — BP 120/82 | HR 101 | Resp 16

## 2016-05-10 DIAGNOSIS — E119 Type 2 diabetes mellitus without complications: Secondary | ICD-10-CM

## 2016-05-10 DIAGNOSIS — L603 Nail dystrophy: Secondary | ICD-10-CM

## 2016-05-10 DIAGNOSIS — E1142 Type 2 diabetes mellitus with diabetic polyneuropathy: Secondary | ICD-10-CM

## 2016-05-10 DIAGNOSIS — Z0189 Encounter for other specified special examinations: Secondary | ICD-10-CM

## 2016-05-10 NOTE — Progress Notes (Signed)
   Subjective:    Patient ID: Wyatt Meyer, male    DOB: September 27, 1960, 55 y.o.   MRN: [id number]  HPI: He presents today for diabetic foot exam. He states that his first toes bilaterals feel stiff at the level of the IP joint over the past several months. He states this seems to be getting worse. He states that he feels like he has pad stuck on the bottom of his feet and experiences tingling numbness and burning recently is also concerned about his toenail hallux bilateral and relates that his hemoglobin A1c in December 2016 was a 13.4.  Review of Systems  Constitutional: Positive for diaphoresis and fatigue.  Musculoskeletal: Positive for back pain.  Skin:       Change in nails  Neurological: Positive for numbness.  All other systems reviewed and are negative.      Objective:   Physical Exam: He presents today vital signs stable alert and oriented 3 very pleasant in no acute distress. Pulses remain palpable. Decreased sensorium per Semmes-Weinstein monofilament to the level of the digits only. Deep tendon reflexes are intact muscle strength is symmetrical and equal bilateral. Orthopedic evaluation of his trace rectus foot type bilateral. Orthopedic evaluation of his result was distal to the ankle for range of motion without crepitation. I see no signs of fracture other than an old injury between the first and second metatarsal of the right foot. Cutaneous evaluation demonstrates supple well-hydrated cutis no open lesions or wounds. He does however have discoloration to the hallux nail plate right.        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  Plan: A sample of the nail today sent for pathologic evaluation and will notify him once the results come in. We did discuss the pros and cons of diabetes and diabetic peripheral neuropathy today education was provided. I also offered him medication however he does not want to take  more medication for the burning of his feet.

## 2016-05-29 ENCOUNTER — Telehealth: Payer: Self-pay

## 2016-05-29 NOTE — Telephone Encounter (Signed)
LVM informing pt of positive nail fungal culture, advised him to call and make an appt to go over treatment options with Dr Milinda Pointer

## 2016-06-05 ENCOUNTER — Ambulatory Visit (INDEPENDENT_AMBULATORY_CARE_PROVIDER_SITE_OTHER): Payer: 59 | Admitting: Podiatry

## 2016-06-05 ENCOUNTER — Encounter: Payer: Self-pay | Admitting: Podiatry

## 2016-06-05 DIAGNOSIS — L603 Nail dystrophy: Secondary | ICD-10-CM

## 2016-06-05 DIAGNOSIS — Z79899 Other long term (current) drug therapy: Secondary | ICD-10-CM | POA: Diagnosis not present

## 2016-06-05 MED ORDER — TERBINAFINE HCL 250 MG PO TABS: 250.0000 mg | ORAL_TABLET | Freq: Every day | ORAL | 0 refills | Status: DC

## 2016-06-05 NOTE — Progress Notes (Signed)
She presents today for follow-up of her pathology results for her toenails. She states there is been no change.  Objective: Vital signs are stable alert and oriented 3. Pulses are palpable. No change in physical exam. Pathology report does demonstrate positive onychomycosis.  Assessment onychomycosis.  Plan: At this point we discussed in great detail today the pros and cons of topical therapy oral therapy and laser therapy. At this point we have decided on oral therapy. We discussed the pros and cons side effects of the oral therapy and that we will it will be necessary for [place] to perform liver profile tests twice during our 120 day medication regimen. We started her on Lamisil 200 mg tablets one by mouth daily 30. We also wrote a requisition for blood work to be performed should this come back abnormal we will notify her immediately. A liver profile requisition was dispensed.

## 2016-06-05 NOTE — Patient Instructions (Signed)

## 2016-06-09 LAB — HEPATIC FUNCTION PANEL
ALK PHOS: 103 U/L (ref 40–115)
ALT: 26 U/L (ref 9–46)
AST: 21 U/L (ref 10–35)
Albumin: 4.4 g/dL (ref 3.6–5.1)
BILIRUBIN INDIRECT: 0.3 mg/dL (ref 0.2–1.2)
Bilirubin, Direct: 0 mg/dL (ref <=0.2)
TOTAL PROTEIN: 7.4 g/dL (ref 6.1–8.1)
Total Bilirubin: 0.3 mg/dL (ref 0.2–1.2)

## 2016-06-15 ENCOUNTER — Telehealth: Payer: Self-pay | Admitting: *Deleted

## 2016-06-15 NOTE — Telephone Encounter (Addendum)
-----   Message from Garrel Ridgel, [place] sent at 06/09/2016  4:48 PM EDT ----- Blood work looks good and may continue medication. 06/15/2016-left message informing pt of Dr. Stephenie Acres orders.

## 2016-06-16 IMAGING — CR DG CHEST 2V
2 series · 2 of 2 positions shown · non-contrast
Comparison: None.

CLINICAL DATA: Fever, chills since yesterday. Mid back pain.
Smoker.

EXAM:
CHEST  2 VIEW

[w chest pa]
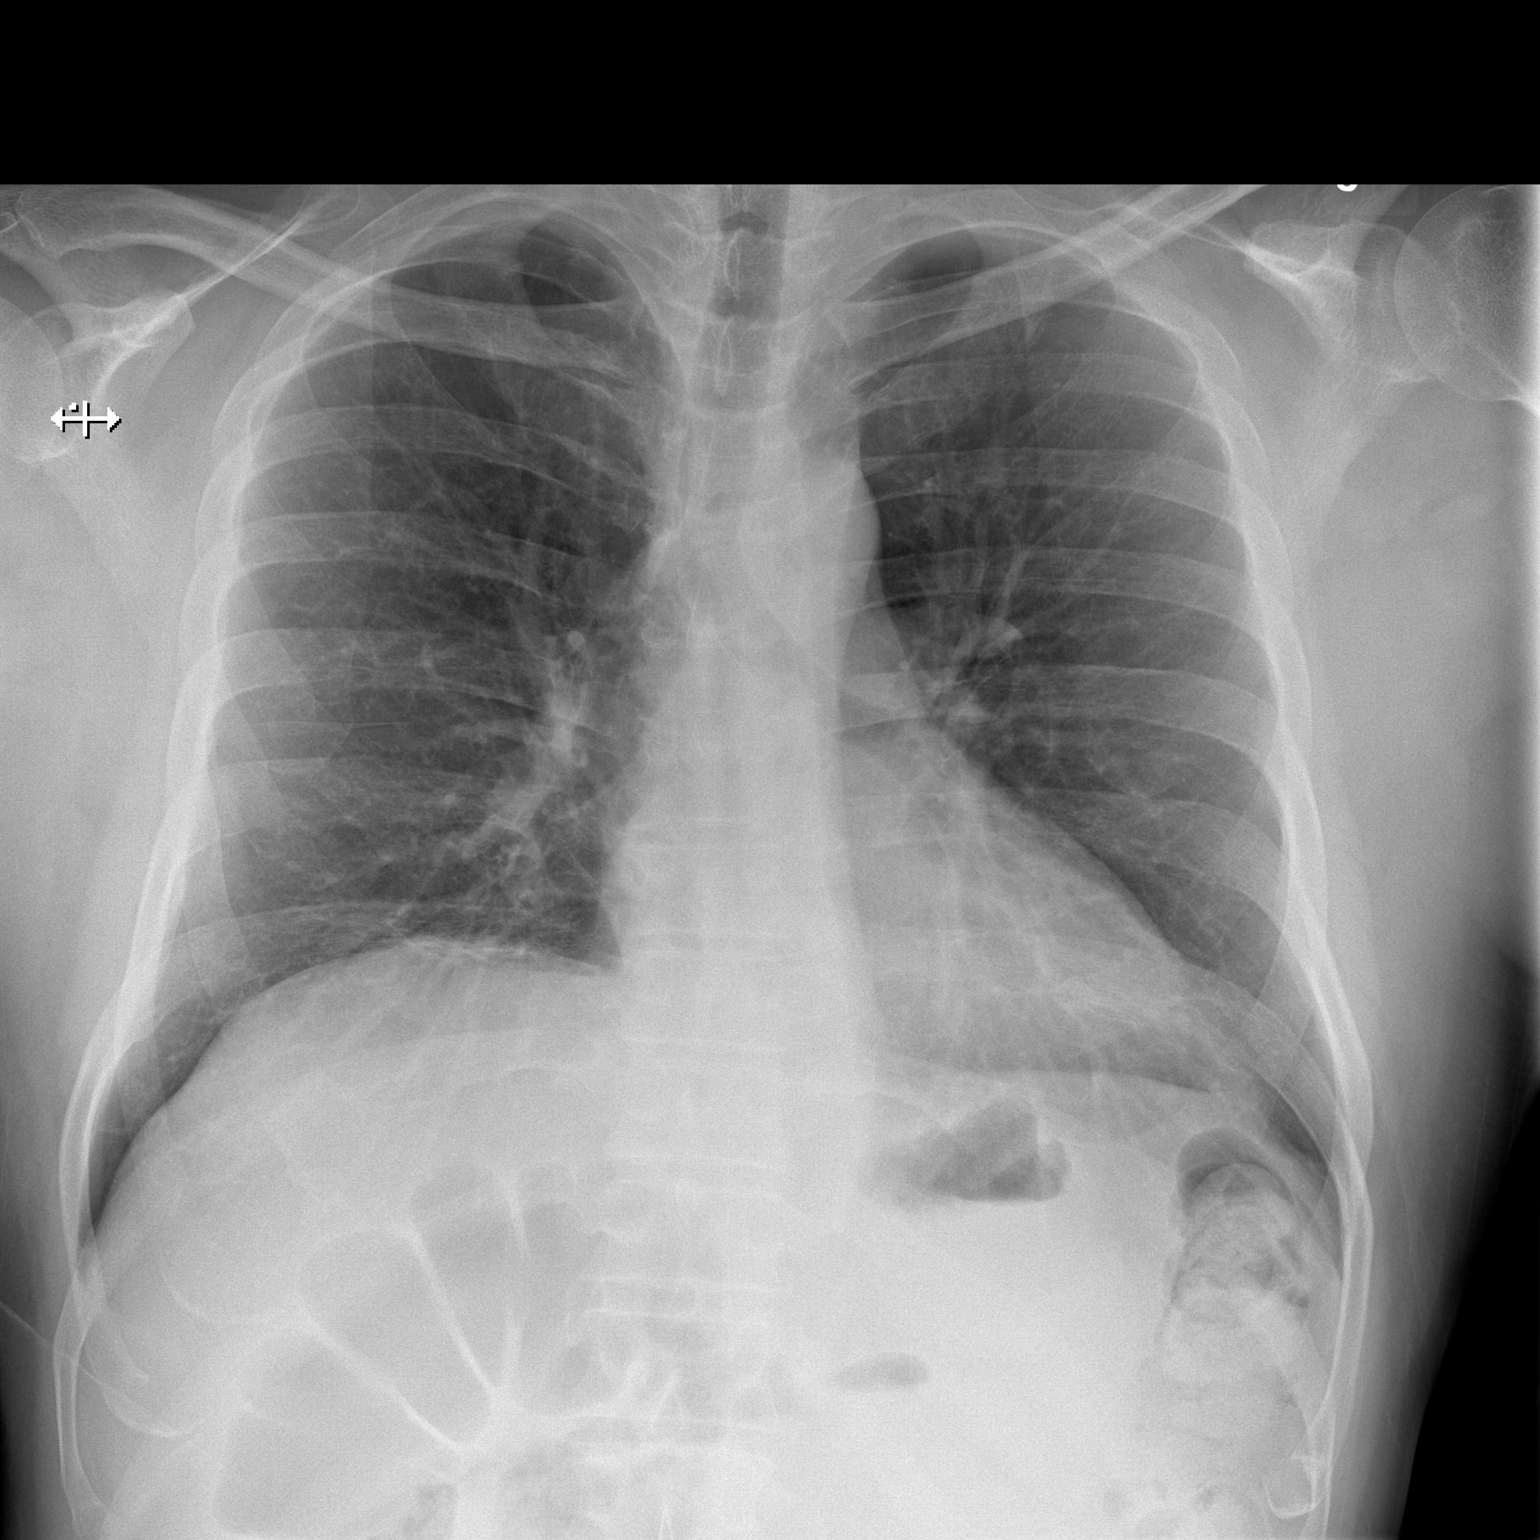

[w chest lat]
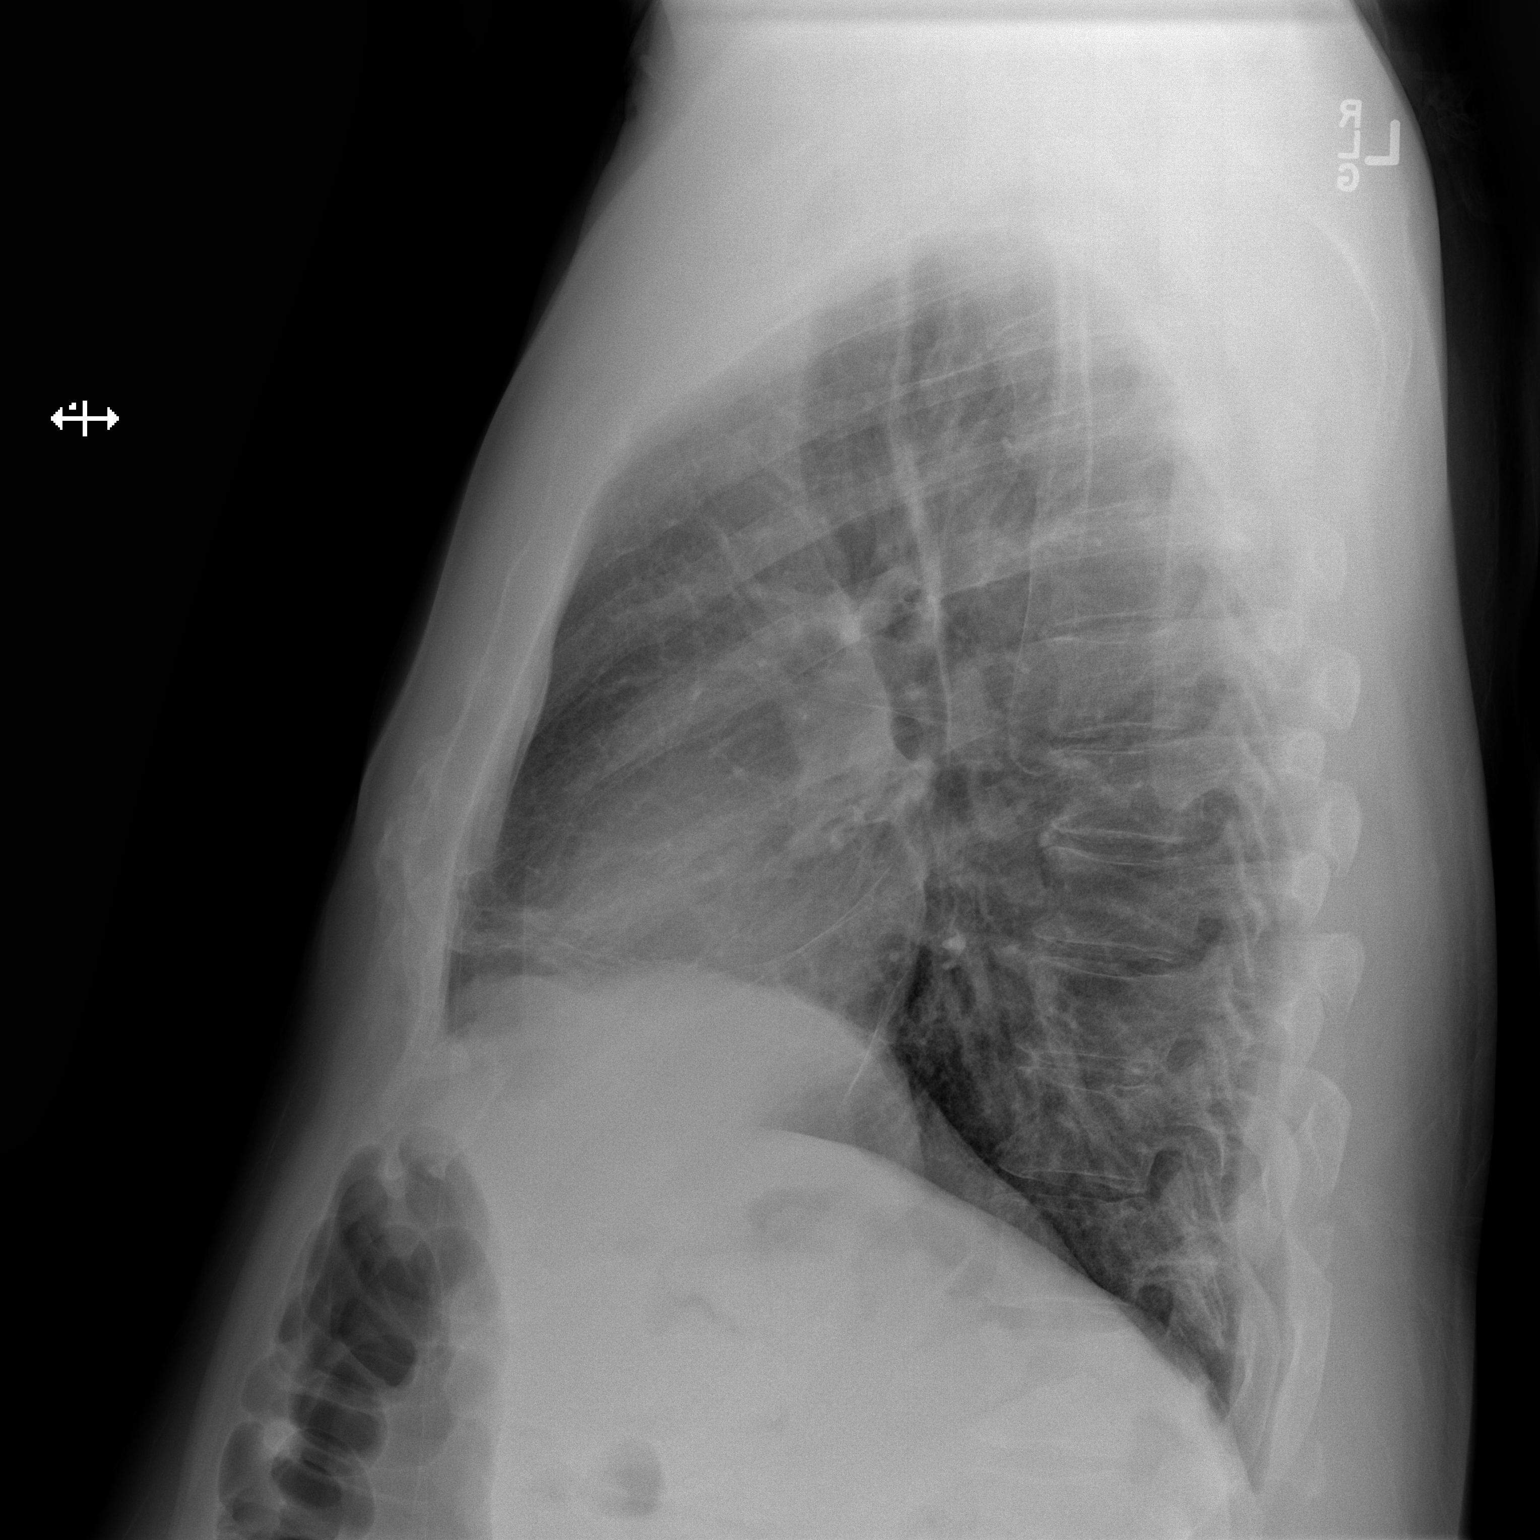

[2 of 2 positions shown; findings below may reference images not displayed]

FINDINGS: Linear densities in the right lung base could reflect scarring or
atelectasis. Left lung is clear. Heart is normal size. No effusions.
No acute bony abnormality.
IMPRESSION: Right base scarring or atelectasis.  No active disease.

## 2016-06-19 ENCOUNTER — Telehealth: Payer: Self-pay | Admitting: *Deleted

## 2016-06-19 NOTE — Telephone Encounter (Signed)
Entered in error

## 2016-07-05 ENCOUNTER — Encounter: Payer: Self-pay | Admitting: Podiatry

## 2016-07-05 ENCOUNTER — Ambulatory Visit (INDEPENDENT_AMBULATORY_CARE_PROVIDER_SITE_OTHER): Payer: 59 | Admitting: Podiatry

## 2016-07-05 DIAGNOSIS — L603 Nail dystrophy: Secondary | ICD-10-CM

## 2016-07-05 DIAGNOSIS — Z79899 Other long term (current) drug therapy: Secondary | ICD-10-CM | POA: Diagnosis not present

## 2016-07-05 MED ORDER — TERBINAFINE HCL 250 MG PO TABS: 250.0000 mg | ORAL_TABLET | Freq: Every day | ORAL | 0 refills | Status: DC

## 2016-07-05 NOTE — Patient Instructions (Signed)

## 2016-07-05 NOTE — Progress Notes (Signed)
He presents today for what should've been his first visit after starting Lamisil. He states that he had blood work done but never started the Lamisil. He left town and when he went to pick it up with pharmacy wasn't there.  Objective: Vital signs are stable he is alert and oriented 3. Pulses are palpable. No change in physical exam.  Assessment: Onychomycosis.  Plan: We have a liver profile that his recent enough to go by. We started him on Lamisil 250 mg tablets 1 by mouth daily 30 days. We discussed the pros and cons of the medication once again signs and symptoms of sickness and associated allergies. We will follow-up with him in 1 month for another liver profile and a prescription for 90 days.

## 2016-08-02 ENCOUNTER — Ambulatory Visit: Payer: 59 | Admitting: Podiatry

## 2016-09-04 ENCOUNTER — Ambulatory Visit: Payer: 59 | Admitting: Podiatry

## 2016-10-23 DIAGNOSIS — N5231 Erectile dysfunction following radical prostatectomy: Secondary | ICD-10-CM

## 2016-10-23 HISTORY — DX: Erectile dysfunction following radical prostatectomy: N52.31

## 2016-11-08 ENCOUNTER — Encounter: Payer: Self-pay | Admitting: Podiatry

## 2016-11-08 ENCOUNTER — Ambulatory Visit (INDEPENDENT_AMBULATORY_CARE_PROVIDER_SITE_OTHER): Payer: 59 | Admitting: Podiatry

## 2016-11-08 DIAGNOSIS — L603 Nail dystrophy: Secondary | ICD-10-CM

## 2016-11-08 NOTE — Progress Notes (Signed)
He presents today with a chief concern of nail fungus for which he never picked up his medication in November 2017. He states these just a very lazy about it and has had problems with his feet. He states that he has burning and tingling to his feet which have become worse over the past few months. States that he's been diagnosed with diabetes and he is unable to take care of his sugars adequately and bring them down to normal levels..  Objective: Vital signs are stable he is alert and oriented 3 pulses are palpable no open lesions or wounds. Also sensorium versus YC monofilament to the level of the midfoot bilateral. No change in onychomycosis.  Assessment: Diabetes mellitus with diabetic peripheral neuropathy. Onychomycosis.  Plan: I recommended that he not even be concerned with the onychomycosis at this time I recommended that he concentrate finding an endocrinologist to help modulate his diabetes and diabetic neuropathy. I will follow-up with him in a future for further discussion if necessary.

## 2016-11-08 NOTE — Patient Instructions (Signed)

## 2016-12-26 ENCOUNTER — Ambulatory Visit: Payer: 59

## 2016-12-26 ENCOUNTER — Ambulatory Visit (INDEPENDENT_AMBULATORY_CARE_PROVIDER_SITE_OTHER): Payer: 59 | Admitting: Family Medicine

## 2016-12-26 ENCOUNTER — Encounter: Payer: Self-pay | Admitting: Family Medicine

## 2016-12-26 VITALS — BP 120/66 | HR 101 | Temp 98.1°F | Ht 73.0 in | Wt 236.2 lb

## 2016-12-26 DIAGNOSIS — E1165 Type 2 diabetes mellitus with hyperglycemia: Secondary | ICD-10-CM | POA: Diagnosis not present

## 2016-12-26 DIAGNOSIS — R109 Unspecified abdominal pain: Secondary | ICD-10-CM | POA: Diagnosis not present

## 2016-12-26 DIAGNOSIS — F17200 Nicotine dependence, unspecified, uncomplicated: Secondary | ICD-10-CM | POA: Diagnosis not present

## 2016-12-26 DIAGNOSIS — D649 Anemia, unspecified: Secondary | ICD-10-CM | POA: Diagnosis not present

## 2016-12-26 DIAGNOSIS — Z794 Long term (current) use of insulin: Secondary | ICD-10-CM | POA: Diagnosis not present

## 2016-12-26 DIAGNOSIS — R1084 Generalized abdominal pain: Secondary | ICD-10-CM | POA: Diagnosis not present

## 2016-12-26 DIAGNOSIS — R10A0 Flank pain, unspecified side: Secondary | ICD-10-CM

## 2016-12-26 DIAGNOSIS — IMO0001 Reserved for inherently not codable concepts without codable children: Secondary | ICD-10-CM

## 2016-12-26 LAB — URINALYSIS, ROUTINE W REFLEX MICROSCOPIC
Bilirubin Urine: NEGATIVE
Nitrite: NEGATIVE
Specific Gravity, Urine: 1.025 (ref 1.000–1.030)
Total Protein, Urine: 100 — AB
Urine Glucose: 250 — AB
Urobilinogen, UA: 1 (ref 0.0–1.0)
pH: 6 (ref 5.0–8.0)

## 2016-12-26 LAB — POC URINALSYSI DIPSTICK (AUTOMATED)
Bilirubin, UA: NEGATIVE
Blood, UA: NEGATIVE
Glucose, UA: 1000
Ketones, UA: 5
Nitrite, UA: NEGATIVE
Protein, UA: 100
Spec Grav, UA: 1.03 (ref 1.030–1.035)
Urobilinogen, UA: 1 (ref ?–2.0)
pH, UA: 6 (ref 5.0–8.0)

## 2016-12-26 LAB — COMPREHENSIVE METABOLIC PANEL
ALT: 19 U/L (ref 0–53)
AST: 17 U/L (ref 0–37)
Albumin: 4.2 g/dL (ref 3.5–5.2)
Alkaline Phosphatase: 75 U/L (ref 39–117)
BUN: 15 mg/dL (ref 6–23)
CO2: 24 mEq/L (ref 19–32)
Calcium: 9.6 mg/dL (ref 8.4–10.5)
Chloride: 100 mEq/L (ref 96–112)
Creatinine, Ser: 1.21 mg/dL (ref 0.40–1.50)
GFR: 79.32 mL/min (ref >60.00)
Glucose, Bld: 221 mg/dL — ABNORMAL HIGH (ref 70–99)
Potassium: 4.5 mEq/L (ref 3.5–5.1)
Sodium: 132 mEq/L — ABNORMAL LOW (ref 135–145)
Total Bilirubin: 0.4 mg/dL (ref 0.2–1.2)
Total Protein: 8.3 g/dL (ref 6.0–8.3)

## 2016-12-26 LAB — CBC WITH DIFFERENTIAL/PLATELET
Basophils Absolute: 0 10*3/uL (ref 0.0–0.1)
Basophils Relative: 0.3 % (ref 0.0–3.0)
Eosinophils Absolute: 0 10*3/uL (ref 0.0–0.7)
Eosinophils Relative: 0.5 % (ref 0.0–5.0)
HCT: 37.3 % — ABNORMAL LOW (ref 39.0–52.0)
Hemoglobin: 12.1 g/dL — ABNORMAL LOW (ref 13.0–17.0)
Lymphocytes Relative: 19.1 % (ref 12.0–46.0)
Lymphs Abs: 1.8 10*3/uL (ref 0.7–4.0)
MCHC: 32.4 g/dL (ref 30.0–36.0)
MCV: 92.7 fl (ref 78.0–100.0)
Monocytes Absolute: 0.9 10*3/uL (ref 0.1–1.0)
Monocytes Relative: 10 % (ref 3.0–12.0)
Neutro Abs: 6.5 10*3/uL (ref 1.4–7.7)
Neutrophils Relative %: 70.1 % (ref 43.0–77.0)
Platelets: 248 10*3/uL (ref 150.0–400.0)
RBC: 4.03 Mil/uL — ABNORMAL LOW (ref 4.22–5.81)
RDW: 13.1 % (ref 11.5–15.5)
WBC: 9.2 10*3/uL (ref 4.0–10.5)

## 2016-12-26 LAB — GLUCOSE, POCT (MANUAL RESULT ENTRY): POC Glucose: 230 mg/dl — AB (ref 70–99)

## 2016-12-26 LAB — HEMOGLOBIN A1C: Hgb A1c MFr Bld: 11.3 % — ABNORMAL HIGH (ref 4.6–6.5)

## 2016-12-26 MED ORDER — CEFTRIAXONE SODIUM 1 G IJ SOLR
1.0000 g | Freq: Once | INTRAMUSCULAR | Status: AC
  Administered 2016-12-26: 1 g via INTRAMUSCULAR

## 2016-12-26 NOTE — Progress Notes (Signed)
Wyatt Meyer is a 56 y.o. male is here to [place].   History of Present Illness:   [name] CMA acting as scribe for Dr. Juleen China.  CC: Patient is coming in today to establish care. He has been running a fever for the last two days. He is having abdominal pain that has been going for a couple weeks. The abdominal pain radiates to the back. He was seen by [facility] 2 or 3 weeks ago. They have put him on Bactrim, but he is unsure what they put him on antibiotic for.   HPI:  1. Flank pain. Bilateral. Mild to moderate. Radiation to groin. Hx of uncontrolled insulin dependent DM. Hx of prostate cancer s/p prostatectomy. Fever x 2 days. No bowel changes. No hematuria. No N/V, HA, dizziness, CP, SOB, confusion. Not checking BS.     3. Uncontrolled type 2 diabetes mellitus without complication, with long-term current use of insulin ([place]). Current symptoms: As above. No blurry vision, chest pain, dyspnea or claudication. Taking medication 75% of the time without noted sided effects. Not regularly checking blood sugars. No episodes of hypoglycemia.  On ACE inhibitor or angiotensin II receptor blocker? No On Aspirin? No  Current diet: in general, an "unhealthy" diet Current exercise: none  Screening due: Nephropathy screening. Retinopathy screening. Neuropathy screening.   Health Maintenance Due  Topic Date Due  . PNEUMOCOCCAL POLYSACCHARIDE VACCINE (1) 05/12/1961  . FOOT EXAM  05/12/1969  . OPHTHALMOLOGY EXAM  05/12/1969  . HIV Screening  05/12/1974  . URINE MICROALBUMIN  09/02/2016   PMHx, SurgHx, SocialHx, Medications, and Allergies were reviewed in the Visit Navigator and updated as appropriate.   Past Medical History:  Diagnosis Date  . Erectile dysfunction following radical prostatectomy 10/23/2016  . Klebsiella sepsis ([place]) 03/25/2015  . Osteoarthritis 08/11/2012  . Peripheral neuropathy ([place]) 08/11/2012  . Prostate cancer ([place])   . Smoker   . SUI (stress  urinary incontinence), male 02/03/2013  . Uncontrolled diabetes mellitus with complications ([place]) 9/48/5462   Past Surgical History:  Procedure Laterality Date  . PROSTATE SURGERY  2013   Family History  Problem Relation Age of Onset  . Hyperlipidemia Mother   . Heart disease Father   . Hyperlipidemia Sister    Social History  Substance Use Topics  . Smoking status: Current Every Day Smoker    Packs/day: 0.33    Types: Cigarettes  . Smokeless tobacco: Never Used  . Alcohol use 0.0 oz/week     Comment: 1-2 beers per month   Current Medications and Allergies:   .  BD PEN NEEDLE NANO U/F 32G X 4 MM MISC, use four times a day, Disp: 120 each, Rfl: 5 .  Insulin Glargine (LANTUS [place]), Inject into the skin., Disp: , Rfl:  .  insulin lispro (HUMALOG KWIKPEN) 100 UNIT/ML KiwkPen, Inject 0.15 mLs (15 Units total) into the skin 3 (three) times daily., Disp: 15 mL, Rfl: 11 .  metFORMIN (GLUCOPHAGE) 1000 MG tablet, Take 1 tablet (1,000 mg total) by mouth 2 (two) times daily with a meal., Disp: 180 tablet, Rfl: 3 .  sildenafil (VIAGRA) 100 MG tablet, Dispense Sildenafil 110mg ; Take 30-60 minutes prior to sexual activity daily prn, Disp: , Rfl:   Allergies  Allergen Reactions  . Codeine Nausea And Vomiting   Review of Systems:   Review of Systems  Constitutional: Positive for chills, fever and malaise/fatigue.  HENT: Negative for congestion, ear pain, sinus pain and sore throat.   Eyes: Negative  for blurred vision and double vision.  Respiratory: Negative for cough, shortness of breath and wheezing.   Cardiovascular: Positive for leg swelling. Negative for chest pain and palpitations.  Gastrointestinal: Positive for abdominal pain and constipation. Negative for diarrhea and vomiting.  Genitourinary: Positive for flank pain. Negative for dysuria and hematuria.  Musculoskeletal: Negative for back pain, joint pain and neck pain.  Skin: Negative for rash.       Rash on right side of stomach.    Neurological: Negative for dizziness and headaches.  Psychiatric/Behavioral: Negative for depression, hallucinations and memory loss.   Vitals:   Vitals:   12/26/16 1330  BP: 120/66  Pulse: (!) 101  Temp: 98.1 F (36.7 C)  TempSrc: Oral  SpO2: 97%  Weight: 236 lb 3.2 oz (107.1 kg)  Height: 6\' 1"  (1.854 m)     Body mass index is 31.16 kg/m.   Physical Exam:   Physical Exam  Constitutional: He is oriented to person, place, and time. He appears well-developed and well-nourished. No distress.  HENT:  Head: Normocephalic and atraumatic.  Right Ear: External ear normal.  Left Ear: External ear normal.  Nose: Nose normal.  Mouth/Throat: Oropharynx is clear and moist.  Eyes: Conjunctivae and EOM are normal. Pupils are equal, round, and reactive to light.  Neck: Normal range of motion. Neck supple.  Cardiovascular: Normal rate, regular rhythm, normal heart sounds and intact distal pulses.   Pulmonary/Chest: Effort normal and breath sounds normal.  Abdominal: Soft. Bowel sounds are normal.  Musculoskeletal: Normal range of motion.  Neurological: He is alert and oriented to person, place, and time.  Skin: Skin is warm and dry. Rash noted.     Psychiatric: He has a normal mood and affect. His behavior is normal. Judgment and thought content normal.  Nursing note and vitals reviewed.  Results for orders placed or performed in visit on 12/26/16  Urine Culture  Result Value Ref Range   Organism ID, Bacteria NO GROWTH   CBC with Differential/Platelet  Result Value Ref Range   WBC 9.2 4.0 - 10.5 K/uL   RBC 4.03 (L) 4.22 - 5.81 Mil/uL   Hemoglobin 12.1 (L) 13.0 - 17.0 g/dL   HCT 37.3 (L) 39.0 - 52.0 %   MCV 92.7 78.0 - 100.0 fl   MCHC 32.4 30.0 - 36.0 g/dL   RDW 13.1 11.5 - 15.5 %   Platelets 248.0 150.0 - 400.0 K/uL   Neutrophils Relative % 70.1 43.0 - 77.0 %   Lymphocytes Relative 19.1 12.0 - 46.0 %   Monocytes Relative 10.0 3.0 - 12.0 %   Eosinophils Relative 0.5 0.0 -  5.0 %   Basophils Relative 0.3 0.0 - 3.0 %   Neutro Abs 6.5 1.4 - 7.7 K/uL   Lymphs Abs 1.8 0.7 - 4.0 K/uL   Monocytes Absolute 0.9 0.1 - 1.0 K/uL   Eosinophils Absolute 0.0 0.0 - 0.7 K/uL   Basophils Absolute 0.0 0.0 - 0.1 K/uL  Comprehensive metabolic panel  Result Value Ref Range   Sodium 132 (L) 135 - 145 mEq/L   Potassium 4.5 3.5 - 5.1 mEq/L   Chloride 100 96 - 112 mEq/L   CO2 24 19 - 32 mEq/L   Glucose, Bld 221 (H) 70 - 99 mg/dL   BUN 15 6 - 23 mg/dL   Creatinine, Ser 1.21 0.40 - 1.50 mg/dL   Total Bilirubin 0.4 0.2 - 1.2 mg/dL   Alkaline Phosphatase 75 39 - 117 U/L   AST 17 0 -  37 U/L   ALT 19 0 - 53 U/L   Total Protein 8.3 6.0 - 8.3 g/dL   Albumin 4.2 3.5 - 5.2 g/dL   Calcium 9.6 8.4 - 10.5 mg/dL   GFR 79.32 >60.00 mL/min  Hemoglobin A1c  Result Value Ref Range   Hgb A1c MFr Bld 11.3 (H) 4.6 - 6.5 %  Urinalysis, Routine w reflex microscopic  Result Value Ref Range   Color, Urine YELLOW Yellow;Lt. Yellow   APPearance Sl Cloudy (A) Clear   Specific Gravity, Urine 1.025 1.000 - 1.030   pH 6.0 5.0 - 8.0   Total Protein, Urine 100 (A) Negative   Urine Glucose 250 (A) Negative   Ketones, ur TRACE (A) Negative   Bilirubin Urine NEGATIVE Negative   Hgb urine dipstick TRACE-INTACT (A) Negative   Urobilinogen, UA 1.0 0.0 - 1.0   Leukocytes, UA TRACE (A) Negative   Nitrite NEGATIVE Negative   WBC, UA 11-20/hpf (A) 0-2/hpf   RBC / HPF 0-2/hpf 0-2/hpf   Mucus, UA Presence of (A) None   Squamous Epithelial / LPF Rare(0-4/hpf) Rare(0-4/hpf)   Hyaline Casts, UA Presence of (A) None  POCT Urinalysis Dipstick (Automated)  Result Value Ref Range   Color, UA Yellow    Clarity, UA Cloudy    Glucose, UA 1,000    Bilirubin, UA Negative    Ketones, UA 5    Spec Grav, UA 1.030 1.030 - 1.035   Blood, UA Negative    pH, UA 6.0 5.0 - 8.0   Protein, UA 100    Urobilinogen, UA 1.0 Negative - 2.0   Nitrite, UA Negative    Leukocytes, UA large (3+) (A) Negative  POCT Glucose (CBG)   Result Value Ref Range   POC Glucose 230 (A) 70 - 99 mg/dl    Assessment and Plan:    [place] was seen today for establish care, abdominal pain and fever.  Diagnoses and all orders for this visit:  Flank pain Comments: No red flags today. He started Bactrim 2 days ago. Will add CTX IM today and have him continue Bactrim if labs allow. Orders: -     POCT Urinalysis Dipstick (Automated) -     cefTRIAXone (ROCEPHIN) injection 1 g; Inject 1 g into the muscle once. -     CBC with Differential/Platelet -     Comprehensive metabolic panel -     Urinalysis, Routine w reflex microscopic -     Urine Culture  Generalized abdominal pain -     POCT Glucose (CBG)  Uncontrolled type 2 diabetes mellitus without complication, with long-term current use of insulin ([place]) Comments: Reviewed the importance of hydration, checking BG, and taking insulin as directed. Orders: -     Hemoglobin A1c  Smoker Comments: I advised patient to quit smoking, and offered support. [place] QUITLINE: 1-800-QUIT-NOW (628) 845-8517).    . Reviewed expectations re: course of current medical issues. . Discussed self-management of symptoms. . Outlined signs and symptoms indicating need for more acute intervention. . Patient verbalized understanding and all questions were answered. . See orders for this visit as documented in the electronic medical record. . Patient received an After Visit Summary.  Records requested if needed. I spent 30 minutes with this patient, greater than 50% was face-to-face time counseling regarding the above diagnoses.  CMA served as Education administrator during this visit. History, Physical, and Plan performed by medical provider. Documentation and orders reviewed and attested to. Briscoe Deutscher, D.O.  Briscoe Deutscher, D.O. [place] [place] 12/27/2016   Follow-up: Return in about 1 week (around 01/02/2017).  Meds ordered this encounter  Medications  . cefTRIAXone (ROCEPHIN) injection 1 g    Medications Discontinued During This Encounter  Medication Reason  . ibuprofen (ADVIL,MOTRIN) 800 MG tablet Error  . Insulin Glargine (BASAGLAR KWIKPEN) 100 UNIT/ML SOPN Error   Orders Placed This Encounter  Procedures  . Urine Culture  . CBC with Differential/Platelet  . Comprehensive metabolic panel  . Hemoglobin A1c  . Urinalysis, Routine w reflex microscopic  . POCT Urinalysis Dipstick (Automated)  . POCT Glucose (CBG)

## 2016-12-26 NOTE — Progress Notes (Signed)
Pre visit review using our clinic review tool, if applicable. No additional management support is needed unless otherwise documented below in the visit note. 

## 2016-12-27 ENCOUNTER — Encounter: Payer: Self-pay | Admitting: Family Medicine

## 2016-12-27 DIAGNOSIS — F17200 Nicotine dependence, unspecified, uncomplicated: Secondary | ICD-10-CM | POA: Insufficient documentation

## 2016-12-27 LAB — URINE CULTURE: Organism ID, Bacteria: NO GROWTH

## 2016-12-30 ENCOUNTER — Encounter: Payer: Self-pay | Admitting: Family Medicine

## 2016-12-31 ENCOUNTER — Telehealth: Payer: Self-pay | Admitting: Surgical

## 2016-12-31 NOTE — Telephone Encounter (Signed)
-----   Message from Briscoe Deutscher, DO sent at 12/30/2016  8:38 AM EDT ----- LETS MAKE SURE TO GET THIS PATIENT BACK IN THIS WEEK FOR RECHECK FLANK PAIN AND TO START ON DM HM ISSUES.

## 2016-12-31 NOTE — Telephone Encounter (Signed)
Left message for patient to return call to schedule appointment

## 2017-01-02 NOTE — Telephone Encounter (Signed)
Left message for patient to return call.

## 2017-01-04 ENCOUNTER — Ambulatory Visit (INDEPENDENT_AMBULATORY_CARE_PROVIDER_SITE_OTHER): Payer: 59 | Admitting: Family Medicine

## 2017-01-04 VITALS — BP 128/80 | HR 94 | Temp 98.4°F | Wt 242.4 lb

## 2017-01-04 DIAGNOSIS — N3000 Acute cystitis without hematuria: Secondary | ICD-10-CM

## 2017-01-04 DIAGNOSIS — F17200 Nicotine dependence, unspecified, uncomplicated: Secondary | ICD-10-CM | POA: Diagnosis not present

## 2017-01-04 DIAGNOSIS — E785 Hyperlipidemia, unspecified: Secondary | ICD-10-CM

## 2017-01-04 DIAGNOSIS — IMO0002 Reserved for concepts with insufficient information to code with codable children: Secondary | ICD-10-CM

## 2017-01-04 DIAGNOSIS — E118 Type 2 diabetes mellitus with unspecified complications: Secondary | ICD-10-CM | POA: Diagnosis not present

## 2017-01-04 DIAGNOSIS — Z23 Encounter for immunization: Secondary | ICD-10-CM

## 2017-01-04 DIAGNOSIS — G63 Polyneuropathy in diseases classified elsewhere: Secondary | ICD-10-CM | POA: Diagnosis not present

## 2017-01-04 DIAGNOSIS — D649 Anemia, unspecified: Secondary | ICD-10-CM

## 2017-01-04 DIAGNOSIS — E1165 Type 2 diabetes mellitus with hyperglycemia: Secondary | ICD-10-CM

## 2017-01-04 DIAGNOSIS — Z794 Long term (current) use of insulin: Secondary | ICD-10-CM

## 2017-01-04 LAB — CBC WITH DIFFERENTIAL/PLATELET
Basophils Absolute: 0 10*3/uL (ref 0.0–0.1)
Basophils Relative: 0.7 % (ref 0.0–3.0)
Eosinophils Absolute: 0.1 10*3/uL (ref 0.0–0.7)
Eosinophils Relative: 0.8 % (ref 0.0–5.0)
HCT: 40.6 % (ref 39.0–52.0)
Hemoglobin: 13.2 g/dL (ref 13.0–17.0)
Lymphocytes Relative: 24.6 % (ref 12.0–46.0)
Lymphs Abs: 1.8 10*3/uL (ref 0.7–4.0)
MCHC: 32.6 g/dL (ref 30.0–36.0)
MCV: 92.6 fl (ref 78.0–100.0)
Monocytes Absolute: 0.5 10*3/uL (ref 0.1–1.0)
Monocytes Relative: 7.5 % (ref 3.0–12.0)
Neutro Abs: 4.7 10*3/uL (ref 1.4–7.7)
Neutrophils Relative %: 66.4 % (ref 43.0–77.0)
Platelets: 369 10*3/uL (ref 150.0–400.0)
RBC: 4.38 Mil/uL (ref 4.22–5.81)
RDW: 13.3 % (ref 11.5–15.5)
WBC: 7.1 10*3/uL (ref 4.0–10.5)

## 2017-01-04 LAB — BASIC METABOLIC PANEL
BUN: 11 mg/dL (ref 6–23)
CO2: 28 mEq/L (ref 19–32)
Calcium: 9.7 mg/dL (ref 8.4–10.5)
Chloride: 101 mEq/L (ref 96–112)
Creatinine, Ser: 1.05 mg/dL (ref 0.40–1.50)
GFR: 93.41 mL/min (ref >60.00)
Glucose, Bld: 255 mg/dL — ABNORMAL HIGH (ref 70–99)
Potassium: 4.4 mEq/L (ref 3.5–5.1)
Sodium: 136 mEq/L (ref 135–145)

## 2017-01-04 LAB — URINALYSIS, ROUTINE W REFLEX MICROSCOPIC
Bilirubin Urine: NEGATIVE
Hgb urine dipstick: NEGATIVE
Ketones, ur: NEGATIVE
Leukocytes, UA: NEGATIVE
Nitrite: NEGATIVE
RBC / HPF: NONE SEEN (ref 0–?)
Specific Gravity, Urine: >=1.03 — AB (ref 1.000–1.030)
Total Protein, Urine: 30 — AB
Urine Glucose: >=1000 — AB
Urobilinogen, UA: 0.2 (ref 0.0–1.0)
pH: 6 (ref 5.0–8.0)

## 2017-01-04 LAB — IRON: Iron: 66 ug/dL (ref 42–165)

## 2017-01-04 LAB — LIPID PANEL
Cholesterol: 249 mg/dL — ABNORMAL HIGH (ref 0–200)
HDL: 36.7 mg/dL — ABNORMAL LOW (ref >39.00)
NonHDL: 212.02
Total CHOL/HDL Ratio: 7
Triglycerides: 246 mg/dL — ABNORMAL HIGH (ref 0.0–149.0)
VLDL: 49.2 mg/dL — ABNORMAL HIGH (ref 0.0–40.0)

## 2017-01-04 LAB — MICROALBUMIN / CREATININE URINE RATIO
Creatinine,U: 169.5 mg/dL
Microalb Creat Ratio: 3.6 mg/g (ref 0.0–30.0)
Microalb, Ur: 6.1 mg/dL — ABNORMAL HIGH (ref 0.0–1.9)

## 2017-01-04 LAB — LDL CHOLESTEROL, DIRECT: Direct LDL: 166 mg/dL

## 2017-01-04 LAB — FERRITIN: Ferritin: 155.6 ng/mL (ref 22.0–322.0)

## 2017-01-04 NOTE — Progress Notes (Signed)
Pre visit review using our clinic review tool, if applicable. No additional management support is needed unless otherwise documented below in the visit note. 

## 2017-01-04 NOTE — Telephone Encounter (Signed)
Patient came in for appointment.  

## 2017-01-04 NOTE — Progress Notes (Signed)
Wyatt Meyer is a 56 y.o. male is here to discuss:  History of Present Illness:   Wyatt Meyer CMA acting as scribe for Dr. Juleen China.  HPI:  1. Uncontrolled type 2 diabetes mellitus with complication, with long-term current use of insulin ([place]).   Current symptoms: No polyuria, polydipsia, blurry vision, chest pain, dyspnea or claudication.  No foot burning, numbness or pain. Taking medication about 50% of the time without noted sided effects. Home glucose monitoring in the range of 200-300 when he checks it, which is not often. No episodes of hypoglycemia.  On ACE inhibitor or angiotensin II receptor blocker? No. On Aspirin? No. Screening Due: See A/P.  Current diet: Generally, he does not watch his carbohydrate intake. Current exercise: None.   Health Maintenance Due  Topic Date Due  . FOOT EXAM  05/12/1969  . OPHTHALMOLOGY EXAM  05/12/1969  . HIV Screening  05/12/1974   PMHx, SurgHx, SocialHx, FamHx, Medications, and Allergies were reviewed in the Visit Navigator and updated as appropriate.   Patient Active Problem List   Diagnosis Date Noted  . Smoker   . Erectile dysfunction following radical prostatectomy 10/23/2016  . Uncontrolled diabetes mellitus with complications ([place]) 96/29/5284  . Acute kidney injury ([place]) 03/23/2015  . Obesity 03/23/2015  . Hyperlipemia 03/23/2015  . History of prostate cancer 03/23/2015  . SUI (stress urinary incontinence), male 02/03/2013  . Osteoarthritis 08/11/2012  . Peripheral neuropathy 08/11/2012   Social History  Substance Use Topics  . Smoking status: Current Every Day Smoker    Packs/day: 0.33    Types: Cigarettes  . Smokeless tobacco: Never Used  . Alcohol use 0.0 oz/week     Comment: 1-2 beers per month   Current Medications and Allergies:    .  BD PEN NEEDLE NANO U/F 32G X 4 MM MISC, use four times a day, Disp: 120 each, Rfl: 5 .  Insulin Glargine (LANTUS [place]), Inject into the skin., Disp: , Rfl:  .  insulin  lispro (HUMALOG KWIKPEN) 100 UNIT/ML KiwkPen, Inject 0.15 mLs (15 Units total) into the skin 3 (three) times daily., Disp: 15 mL, Rfl: 11 .  metFORMIN (GLUCOPHAGE) 1000 MG tablet, Take 1 tablet (1,000 mg total) by mouth 2 (two) times daily with a meal., Disp: 180 tablet, Rfl: 3 .  sildenafil (VIAGRA) 100 MG tablet, Dispense Sildenafil 110mg ; Take 30-60 minutes prior to sexual activity daily prn, Disp: , Rfl:   Allergies  Allergen Reactions  . Codeine Nausea And Vomiting   Review of Systems   Review of Systems  Constitutional: Positive for malaise/fatigue. Negative for chills and fever.  HENT: Negative for congestion, ear pain, sinus pain and sore throat.   Eyes: Positive for blurred vision. Negative for double vision.  Respiratory: Negative for cough, shortness of breath and wheezing.   Cardiovascular: Negative for chest pain, palpitations and leg swelling.  Gastrointestinal: Negative for constipation, diarrhea and vomiting.  Genitourinary: Negative for dysuria and flank pain.  Musculoskeletal: Negative for back pain, joint pain and neck pain.  Neurological: Negative for dizziness and headaches.  Psychiatric/Behavioral: Negative for depression, hallucinations and memory loss.    Vitals:   Vitals:   01/04/17 0739  BP: 128/80  Pulse: 94  Temp: 98.4 F (36.9 C)  TempSrc: Oral  SpO2: 98%  Weight: 242 lb 6.4 oz (110 kg)     Body mass index is 31.98 kg/m.   Physical Exam:    Physical Exam  Constitutional: He is oriented to person, place, and  time. He appears well-developed and well-nourished. No distress.  HENT:  Head: Normocephalic and atraumatic.  Right Ear: External ear normal.  Left Ear: External ear normal.  Nose: Nose normal.  Mouth/Throat: Oropharynx is clear and moist.  Eyes: Conjunctivae and EOM are normal. Pupils are equal, round, and reactive to light.  Neck: Normal range of motion. Neck supple.  Cardiovascular: Normal rate, regular rhythm, normal heart sounds  and intact distal pulses.   Pulmonary/Chest: Effort normal and breath sounds normal.  Abdominal: Soft. Bowel sounds are normal.  Musculoskeletal: Normal range of motion.  Neurological: He is alert and oriented to person, place, and time.  Skin: Skin is warm and dry.  Psychiatric: He has a normal mood and affect. His behavior is normal. Judgment and thought content normal.  Nursing note and vitals reviewed.  Results for orders placed or performed in visit on 01/04/17  Urinalysis, Routine w reflex microscopic  Result Value Ref Range   Color, Urine YELLOW Yellow;Lt. Yellow   APPearance CLEAR Clear   Specific Gravity, Urine >=1.030 (A) 1.000 - 1.030   pH 6.0 5.0 - 8.0   Total Protein, Urine 30 (A) Negative   Urine Glucose >=1000 (A) Negative   Ketones, ur NEGATIVE Negative   Bilirubin Urine NEGATIVE Negative   Hgb urine dipstick NEGATIVE Negative   Urobilinogen, UA 0.2 0.0 - 1.0   Leukocytes, UA NEGATIVE Negative   Nitrite NEGATIVE Negative   WBC, UA 0-2/hpf 0-2/hpf   RBC / HPF none seen 0-2/hpf   Squamous Epithelial / LPF Rare(0-4/hpf) Rare(0-4/hpf)  Microalbumin / creatinine urine ratio  Result Value Ref Range   Microalb, Ur 6.1 (H) 0.0 - 1.9 mg/dL   Creatinine,U 169.5 mg/dL   Microalb Creat Ratio 3.6 0.0 - 30.0 mg/g  Ferritin  Result Value Ref Range   Ferritin 155.6 22.0 - 322.0 ng/mL  CBC with Differential/Platelet  Result Value Ref Range   WBC 7.1 4.0 - 10.5 K/uL   RBC 4.38 4.22 - 5.81 Mil/uL   Hemoglobin 13.2 13.0 - 17.0 g/dL   HCT 40.6 39.0 - 52.0 %   MCV 92.6 78.0 - 100.0 fl   MCHC 32.6 30.0 - 36.0 g/dL   RDW 13.3 11.5 - 15.5 %   Platelets 369.0 150.0 - 400.0 K/uL   Neutrophils Relative % 66.4 43.0 - 77.0 %   Lymphocytes Relative 24.6 12.0 - 46.0 %   Monocytes Relative 7.5 3.0 - 12.0 %   Eosinophils Relative 0.8 0.0 - 5.0 %   Basophils Relative 0.7 0.0 - 3.0 %   Neutro Abs 4.7 1.4 - 7.7 K/uL   Lymphs Abs 1.8 0.7 - 4.0 K/uL   Monocytes Absolute 0.5 0.1 - 1.0  K/uL   Eosinophils Absolute 0.1 0.0 - 0.7 K/uL   Basophils Absolute 0.0 0.0 - 0.1 K/uL  Iron  Result Value Ref Range   Iron 66 42 - 165 ug/dL  Basic metabolic panel  Result Value Ref Range   Sodium 136 135 - 145 mEq/L   Potassium 4.4 3.5 - 5.1 mEq/L   Chloride 101 96 - 112 mEq/L   CO2 28 19 - 32 mEq/L   Glucose, Bld 255 (H) 70 - 99 mg/dL   BUN 11 6 - 23 mg/dL   Creatinine, Ser 1.05 0.40 - 1.50 mg/dL   Calcium 9.7 8.4 - 10.5 mg/dL   GFR 93.41 >60.00 mL/min  Lipid panel  Result Value Ref Range   Cholesterol 249 (H) 0 - 200 mg/dL   Triglycerides 246.0 (  H) 0.0 - 149.0 mg/dL   HDL 36.70 (L) >39.00 mg/dL   VLDL 49.2 (H) 0.0 - 40.0 mg/dL   Total CHOL/HDL Ratio 7    NonHDL 212.02   LDL cholesterol, direct  Result Value Ref Range   Direct LDL 166.0 mg/dL    Assessment and Plan:    Fraser was seen today for follow-up.  Diagnoses and all orders for this visit:  Uncontrolled type 2 diabetes mellitus with complication, with long-term current use of insulin ([place]) Comments: The patient has a poor understanding of DM. Signficant time was spent discussing the importance of controlling his blood sugars. Since his barriers seem to be inability to remember to take meal-time coverage, we discussed increasing his Lantus with another oral adjunct. He is interested in oral options only for control. I will go ahead and send him to Endocrinology to discuss.  Orders: -     Microalbumin / creatinine urine ratio -     Ambulatory referral to Ophthalmology -     Basic metabolic panel -     Ambulatory referral to Endocrinology -     Pneumococcal polysaccharide vaccine 23-valent greater than or equal to 2yo subcutaneous/IM  Polyneuropathy associated with underlying disease ([place]) Comments: The patient declined Neurontin today. Stressed the importance of decreasing his average BG to control this issue.  Hyperlipidemia, unspecified hyperlipidemia type -     Lipid panel  Smoker Comments: I  advised patient to quit smoking, and offered support.  QUITLINE: 1-800-QUIT-NOW [phone]).  Anemia, unspecified type -     Ambulatory referral to Gastroenterology - due for screening anyway -     Ferritin -     CBC with Differential/Platelet -     Iron  Acute cystitis without hematuria Comments: Symptoms resolved. Patient would like repeat UA today. Orders: -     Urinalysis, Routine w reflex microscopic    . Reviewed expectations re: course of current medical issues. . Discussed self-management of symptoms. . Outlined signs and symptoms indicating need for more acute intervention. . Patient verbalized understanding and all questions were answered. . See orders for this visit as documented in the electronic medical record. . Patient received an After Visit Summary.   CMA served as Education administrator during this visit. History, Physical, and Plan performed by medical provider. Documentation and orders reviewed and attested to. Briscoe Deutscher, D.O.  [place]riscoe Deutscher, [place], [place] 01/05/2017  Follow-up: 1-3 months.

## 2017-01-05 ENCOUNTER — Encounter: Payer: Self-pay | Admitting: Family Medicine

## 2017-01-05 MED ORDER — LISINOPRIL 5 MG PO TABS: 5.0000 mg | ORAL_TABLET | Freq: Every day | ORAL | 3 refills | Status: DC

## 2017-01-05 MED ORDER — ASPIRIN EC 81 MG PO TBEC: 81.0000 mg | DELAYED_RELEASE_TABLET | Freq: Every day | ORAL | 3 refills | Status: DC

## 2017-01-05 MED ORDER — ROSUVASTATIN CALCIUM 40 MG PO TABS: 40.0000 mg | ORAL_TABLET | Freq: Every day | ORAL | 3 refills | Status: DC

## 2017-01-05 NOTE — Progress Notes (Signed)
   Wyatt Meyer was seen today for follow-up.  Diagnoses and all orders for this visit:  Uncontrolled type 2 diabetes mellitus with complication, with long-term current use of insulin ([place]) Comments: The patient has a poor understanding of DM. Signficant time was spent discussing the importance of controlling BG. Orders: -     Ambulatory referral to Ophthalmology -     Ambulatory referral to Endocrinology -     Pneumococcal polysaccharide vaccine  -     lisinopril (PRINIVIL,ZESTRIL) 5 MG tablet; Take 1 tablet (5 mg total) by mouth daily.  Hyperlipidemia, unspecified hyperlipidemia type -     rosuvastatin (CRESTOR) 40 MG tablet; Take 1 tablet (40 mg total) by mouth daily. -     aspirin EC 81 MG tablet; Take 1 tablet (81 mg total) by mouth daily.   Wyatt Meyer, D.O. [place], [facility]

## 2017-02-15 ENCOUNTER — Encounter: Payer: Self-pay | Admitting: Internal Medicine

## 2017-02-15 ENCOUNTER — Ambulatory Visit (INDEPENDENT_AMBULATORY_CARE_PROVIDER_SITE_OTHER): Payer: 59 | Admitting: Internal Medicine

## 2017-02-15 VITALS — BP 130/80 | HR 82 | Ht 74.0 in | Wt 237.0 lb

## 2017-02-15 DIAGNOSIS — E1165 Type 2 diabetes mellitus with hyperglycemia: Secondary | ICD-10-CM | POA: Diagnosis not present

## 2017-02-15 DIAGNOSIS — E1142 Type 2 diabetes mellitus with diabetic polyneuropathy: Secondary | ICD-10-CM | POA: Diagnosis not present

## 2017-02-15 DIAGNOSIS — IMO0002 Reserved for concepts with insufficient information to code with codable children: Secondary | ICD-10-CM

## 2017-02-15 MED ORDER — INSULIN GLARGINE 100 UNIT/ML SOLOSTAR PEN: 28.0000 [IU] | PEN_INJECTOR | Freq: Every day | SUBCUTANEOUS | 3 refills | Status: DC

## 2017-02-15 MED ORDER — INSULIN PEN NEEDLE 32G X 4 MM MISC: 3 refills | Status: DC

## 2017-02-15 MED ORDER — GLUCOSE BLOOD VI STRP: ORAL_STRIP | 11 refills | Status: DC

## 2017-02-15 MED ORDER — ONETOUCH LANCETS MISC: 11 refills | Status: DC

## 2017-02-15 MED ORDER — INSULIN LISPRO 100 UNIT/ML (KWIKPEN): 15.0000 [IU] | PEN_INJECTOR | Freq: Three times a day (TID) | SUBCUTANEOUS | 3 refills | Status: DC

## 2017-02-15 NOTE — Progress Notes (Signed)
Patient ID: Wyatt Meyer, male   DOB: 01-14-1961, 56 y.o.   MRN: [id number]   HPI: Wyatt Meyer is a 56 y.o.-year-old male, referred by his PCP, Briscoe Deutscher, DO, for management of DM2, dx in  2012 insulin-dependent in 2015, uncontrolled, with complications (mild CKD, PN).  Last hemoglobin A1c was: Lab Results  Component Value Date   HGBA1C 11.3 (H) 12/26/2016   HGBA1C >14.0 04/04/2016   HGBA1C 11.7 01/02/2016   HGBA1C 13.4 09/03/2015   HGBA1C 13.4 (A) 09/03/2015   HGBA1C 12.1 (H) 03/23/2015   Pt is on a regimen of: - Metformin 1000 mg 2x a day, with meals >> tolerates it well - Lantus 28 units at bedtime >> actually takes it 5/7 days (forgets) - Humalog 16 units 3x a day, before meals >> 1-2x a day He tried [employer].   Pt tells me: "If I take the regimen as prescribed, my sugars are 110-120".  Pt checks his sugars 0-1x a day and they are: - am: 250-300s - 2h after b'fast: n/c - before lunch: n/c - 2h after lunch: n/c - before dinner: n/c - 2h after dinner: n/c - bedtime: n/c - nighttime: n/c No lows. Lowest sugar was 90; ? hypoglycemia awareness. Highest sugar was 500-600.  Glucometer: One Touch Ultra 2  Pt's meals are: - Breakfast: oatmeal or sausage bisquit - Lunch: chicken + fries, salads - Dinner: meat + veggies + starch - Snacks: no sodas; tea; crystal lite; chips, candy  - + mild CKD, last BUN/creatinine:  Lab Results  Component Value Date   BUN 11 01/04/2017   BUN 15 12/26/2016   CREATININE 1.05 01/04/2017   CREATININE 1.21 12/26/2016  On lisinopril. - last set of lipids: Lab Results  Component Value Date   CHOL 249 (H) 01/04/2017   HDL 36.70 (L) 01/04/2017   LDLCALC NOT CALC 09/03/2015   LDLDIRECT 166.0 01/04/2017   TRIG 246.0 (H) 01/04/2017   CHOLHDL 7 01/04/2017  On Crestor. - last eye exam was in 2016. No DR.  - + numbness and tingling in his feet.  Pt has FH of DM in mother.  He has PrCa - in remission - dx'ed  ~2012.  ROS: Constitutional: no weight gain/loss, + fatigue, no subjective hyperthermia/hypothermia, + nocturia Eyes: no blurry vision, no xerophthalmia ENT: no sore throat, no nodules palpated in throat, no dysphagia/odynophagia, no hoarseness Cardiovascular: no CP/SOB/palpitations/+ leg swelling Respiratory: no cough/SOB Gastrointestinal: no N/V/D/C Musculoskeletal: + both:muscle/joint aches Skin: no rashes Neurological: no tremors/numbness/tingling/dizziness Psychiatric: no depression/anxiety + diff with erections, + low libido  Past Medical History:  Diagnosis Date  . Erectile dysfunction following radical prostatectomy 10/23/2016  . Klebsiella sepsis ([place]) 03/25/2015  . Osteoarthritis 08/11/2012  . Peripheral neuropathy 08/11/2012  . Prostate cancer ([place])   . Smoker   . SUI (stress urinary incontinence), male 02/03/2013  . Uncontrolled diabetes mellitus with complications ([place]) 04/08/9677   Past Surgical History:  Procedure Laterality Date  . PROSTATE SURGERY  2013   Social History   Social History  . Marital status: Married    Spouse name: N/A  . 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 Smoking status: Current Every Day Smoker    Packs/day: 0.25    Types: Cigarettes  . Smokeless tobacco: Never Used  . Alcohol use 0.0 oz/week     Comment: 5-6 beers per year  . Drug use: No   Current  Outpatient Prescriptions on File Prior to Visit  Medication Sig Dispense Refill  . aspirin EC 81 MG tablet Take 1 tablet (81 mg total) by mouth daily. 90 tablet 3  . lisinopril (PRINIVIL,ZESTRIL) 5 MG tablet Take 1 tablet (5 mg total) by mouth daily. 90 tablet 3  . metFORMIN (GLUCOPHAGE) 1000 MG tablet Take 1 tablet (1,000 mg total) by mouth 2 (two) times daily with a meal. 180 tablet 3  . rosuvastatin (CRESTOR) 40 MG tablet Take 1 tablet (40 mg total) by mouth daily. 90 tablet 3  . sildenafil (VIAGRA) 100 MG tablet Dispense  Sildenafil 110mg ; Take 30-60 minutes prior to sexual activity daily prn     No current facility-administered medications on file prior to visit.    Allergies  Allergen Reactions  . Codeine Nausea And Vomiting   Family History  Problem Relation Age of Onset  . Hyperlipidemia Mother   . Heart disease Father   . Hyperlipidemia Sister    PE: BP 130/80 (BP Location: Left Arm, Patient Position: Sitting)   Pulse 82   Ht 6\' 2"  (1.88 m)   Wt 237 lb (107.5 kg)   SpO2 98%   BMI 30.43 kg/m  Wt Readings from Last 3 Encounters:  02/15/17 237 lb (107.5 kg)  01/04/17 242 lb 6.4 oz (110 kg)  12/26/16 236 lb 3.2 oz (107.1 kg)   Constitutional: overweight, in NAD Eyes: PERRLA, EOMI, no exophthalmos ENT: moist mucous membranes, no thyromegaly, no cervical lymphadenopathy Cardiovascular: RRR, No MRG Respiratory: CTA B Gastrointestinal: abdomen soft, NT, ND, BS+ Musculoskeletal: no deformities, strength intact in all 4 Skin: moist, warm, no rashes Neurological: no tremor with outstretched hands, DTR normal in all 4  ASSESSMENT: 1. DM2, insulin-dependent, uncontrolled, with complications - PN - mild CKD  PLAN:  1. Patient with long-standing, uncontrolled diabetes, on oral + basal-bolus insulin regimen >> noncompliant with the medications and sugar checks. HbA1c levels are very high, last >11%. We discussed that he absolutely need insulin for now. I suspect that he has not only insulin resistance but also decreased production >> need to check him for DM1 when sugars improve (for a C peptide check, Glu needs to be <200). - He tells me that his regimen is adequate, but he is very busy, skips meals, skips insulin injections and sugar checks. In this case, we discussed about starting to take insulin as Rx'ed for now and if no improvement, to start changing the regimen. I suggested alarms ton his phone to remind him of Humalog dosing, also, to move Lantus to after dinner, so that he does not fall  asleep later w/o taking it.I advised him not to skip meals and will give him a more flexible Humalog dosing regimen. He needs to move it to before meals, as now he is taking it after meals, which is conducive to large CBG fluctuations - I also suggested dietary change - I suggested a Freestyle Libre CGM >> he will see if covered - I suggested to:  Patient Instructions  Please continue: - Metformin 1000 mg 2x a day - Lantus 28 units - but move this after dinner  Please change: - Humalog: 14 units for a smaller meal 16 units for a regular meal 18 units for a larger meal  Try to inject the Humalog 10-15 min before a meal.  Look into a Freestyle Libre CGM.  Please come back for a follow-up appointment in 3 months.  - Strongly advised him to start checking sugars at different  times of the day - check 3 times a day, rotating checks - given sugar log and advised how to fill it and to bring it at next appt  - given foot care handout and explained the principles  - given instructions for hypoglycemia management "15-15 rule"  - advised for yearly eye exams >> needs one - Return to clinic in 3 mo with sugar log   Philemon Kingdom, MD PhD [facility] Endocrinology

## 2017-02-15 NOTE — Patient Instructions (Signed)
Please continue: - Metformin 1000 mg 2x a day - Lantus 28 units - but move this after dinner  Please change: - Humalog: 14 units for a smaller meal 16 units for a regular meal 18 units for a larger meal  Try to inject the Humalog 10-15 min before a meal.  Look into a Freestyle Libre CGM.  Please come back for a follow-up appointment in 3 months.  PATIENT INSTRUCTIONS FOR TYPE 2 DIABETES:  **Please join MyChart!** - see attached instructions about how to join if you have not done so already.  DIET AND EXERCISE Diet and exercise is an important part of diabetic treatment.  We recommended aerobic exercise in the form of brisk walking (working between 40-60% of maximal aerobic capacity, similar to brisk walking) for 150 minutes per week (such as 30 minutes five days per week) along with 3 times per week performing 'resistance' training (using various gauge rubber tubes with handles) 5-10 exercises involving the major muscle groups (upper body, lower body and core) performing 10-15 repetitions (or near fatigue) each exercise. Start at half the above goal but build slowly to reach the above goals. If limited by weight, joint pain, or disability, we recommend daily walking in a swimming pool with water up to waist to reduce pressure from joints while allow for adequate exercise.    BLOOD GLUCOSES Monitoring your blood glucoses is important for continued management of your diabetes. Please check your blood glucoses 2-4 times a day: fasting, before meals and at bedtime (you can rotate these measurements - e.g. one day check before the 3 meals, the next day check before 2 of the meals and before bedtime, etc.).   HYPOGLYCEMIA (low blood sugar) Hypoglycemia is usually a reaction to not eating, exercising, or taking too much insulin/ other diabetes drugs.  Symptoms include tremors, sweating, hunger, confusion, headache, etc. Treat IMMEDIATELY with 15 grams of Carbs: . 4 glucose tablets .  cup  regular juice/soda . 2 tablespoons raisins . 4 teaspoons sugar . 1 tablespoon honey Recheck blood glucose in 15 mins and repeat above if still symptomatic/blood glucose <100.  RECOMMENDATIONS TO REDUCE YOUR RISK OF DIABETIC COMPLICATIONS: * Take your prescribed MEDICATION(S) * Follow a DIABETIC diet: Complex carbs, fiber rich foods, (monounsaturated and polyunsaturated) fats * AVOID saturated/trans fats, high fat foods, >2,300 mg salt per day. * EXERCISE at least 5 times a week for 30 minutes or preferably daily.  * DO NOT SMOKE OR DRINK more than 1 drink a day. * Check your FEET every day. Do not wear tightfitting shoes. Contact us if you develop an ulcer * See your EYE doctor once a year or more if needed * Get a FLU shot once a year * Get a PNEUMONIA vaccine once before and once after age 45 years  GOALS:  * Your Hemoglobin A1c of <7%  * fasting sugars need to be <130 * after meals sugars need to be <180 (2h after you start eating) * Your Systolic BP should be 875 or lower  * Your Diastolic BP should be 80 or lower  * Your HDL (Good Cholesterol) should be 40 or higher  * Your LDL (Bad Cholesterol) should be 100 or lower. * Your Triglycerides should be 150 or lower  * Your Urine microalbumin (kidney function) should be <30 * Your Body Mass Index should be 25 or lower    Please consider the following ways to cut down carbs and fat and increase fiber and micronutrients in your  diet: - substitute whole grain for white bread or pasta - substitute brown rice for white rice - substitute 90-calorie flat bread pieces for slices of bread when possible - substitute sweet potatoes or yams for white potatoes - substitute humus for margarine - substitute tofu for cheese when possible - substitute almond or rice milk for regular milk (would not drink soy milk daily due to concern for soy estrogen influence on breast cancer risk) - substitute dark chocolate for other sweets when possible -  substitute water - can add lemon or orange slices for taste - for diet sodas (artificial sweeteners will trick your body that you can eat sweets without getting calories and will lead you to overeating and weight gain in the long run) - do not skip breakfast or other meals (this will slow down the metabolism and will result in more weight gain over time)  - can try smoothies made from fruit and almond/rice milk in am instead of regular breakfast - can also try old-fashioned (not instant) oatmeal made with almond/rice milk in am - order the dressing on the side when eating salad at a restaurant (pour less than half of the dressing on the salad) - eat as little meat as possible - can try juicing, but should not forget that juicing will get rid of the fiber, so would alternate with eating raw veg./fruits or drinking smoothies - use as little oil as possible, even when using olive oil - can dress a salad with a mix of balsamic vinegar and lemon juice, for e.g. - use agave nectar, stevia sugar, or regular sugar rather than artificial sweateners - steam or broil/roast veggies  - snack on veggies/fruit/nuts (unsalted, preferably) when possible, rather than processed foods - reduce or eliminate aspartame in diet (it is in diet sodas, chewing gum, etc) Read the labels!  Try to read Dr. Janene Harvey book: "Program for Reversing Diabetes" for other ideas for healthy eating.

## 2017-03-07 ENCOUNTER — Encounter: Payer: Self-pay | Admitting: Family Medicine

## 2017-03-24 ENCOUNTER — Other Ambulatory Visit: Payer: Self-pay | Admitting: Family Medicine

## 2017-03-25 NOTE — Telephone Encounter (Signed)
Please advise on refill. Historical provider and patient sees Endo.

## 2017-04-03 ENCOUNTER — Other Ambulatory Visit: Payer: Self-pay

## 2017-04-03 ENCOUNTER — Telehealth: Payer: Self-pay | Admitting: Internal Medicine

## 2017-04-03 MED ORDER — METFORMIN HCL 1000 MG PO TABS: ORAL_TABLET | ORAL | 3 refills | Status: DC

## 2017-04-03 NOTE — Telephone Encounter (Signed)
Submitted

## 2017-04-03 NOTE — Telephone Encounter (Signed)
Pt needs a rx for metformin 1000 mg 2 times daily called in to [facility].

## 2017-05-30 ENCOUNTER — Ambulatory Visit: Payer: 59 | Admitting: Internal Medicine

## 2017-06-19 ENCOUNTER — Ambulatory Visit (INDEPENDENT_AMBULATORY_CARE_PROVIDER_SITE_OTHER): Payer: 59 | Admitting: Internal Medicine

## 2017-06-19 ENCOUNTER — Encounter: Payer: Self-pay | Admitting: Internal Medicine

## 2017-06-19 VITALS — BP 120/80 | HR 97 | Wt 245.0 lb

## 2017-06-19 DIAGNOSIS — E1142 Type 2 diabetes mellitus with diabetic polyneuropathy: Secondary | ICD-10-CM | POA: Diagnosis not present

## 2017-06-19 DIAGNOSIS — E1165 Type 2 diabetes mellitus with hyperglycemia: Secondary | ICD-10-CM | POA: Diagnosis not present

## 2017-06-19 DIAGNOSIS — E669 Obesity, unspecified: Secondary | ICD-10-CM | POA: Diagnosis not present

## 2017-06-19 DIAGNOSIS — Z6831 Body mass index (BMI) 31.0-31.9, adult: Secondary | ICD-10-CM

## 2017-06-19 DIAGNOSIS — IMO0002 Reserved for concepts with insufficient information to code with codable children: Secondary | ICD-10-CM

## 2017-06-19 MED ORDER — INSULIN GLARGINE 100 UNIT/ML SOLOSTAR PEN: 36.0000 [IU] | PEN_INJECTOR | Freq: Every day | SUBCUTANEOUS | 3 refills | Status: DC

## 2017-06-19 MED ORDER — DULAGLUTIDE 0.75 MG/0.5ML ~~LOC~~ SOAJ: SUBCUTANEOUS | 1 refills | Status: DC

## 2017-06-19 NOTE — Progress Notes (Signed)
Patient ID: Wyatt Meyer, male   DOB: 07/17/1961, 56 y.o.   MRN: [id number]   HPI: Wyatt Meyer is a 56 y.o.-year-old male, returning for f/u for DM2, dx in  2012 insulin-dependent in 2015, uncontrolled, with complications (mild CKD, PN). Last OV 4 mo ago.  Last hemoglobin A1c was: Lab Results  Component Value Date   HGBA1C 11.3 (H) 12/26/2016   HGBA1C >14.0 04/04/2016   HGBA1C 11.7 01/02/2016   HGBA1C 13.4 09/03/2015   HGBA1C 13.4 (A) 09/03/2015   HGBA1C 12.1 (H) 03/23/2015   Pt was on a regimen of: - Metformin 1000 mg 2x a day, with meals >> tolerates it well - Lantus 28 units at bedtime >> actually takes it 5/7 days (forgets) - Humalog 16 units 3x a day, before meals >> 1-2x a day He tried [employer].  Pt was telling me at last visit: "If I take the regimen as prescribed, my sugars are 110-120".  Now on: - Metformin 1000 mg 2x a day - Lantus 28 units after dinner - Humalog - misses doses, skips meals: 14 units for a smaller meal 16 units for a regular meal 18 units for a larger meal  Pt checks his sugars ~1x day. - am: 250-300s >> 99, 200-300s - 2h after b'fast: n/c - before lunch: 413, 431 - 2h after lunch: n/c >> 380-443 - before dinner: n/c - 2h after dinner: n/c - bedtime: n/c - nighttime: n/c Lowest sugar was 90 >> 99; ? hypoglycemia awareness. Highest sugar was 500-600 >> 498  Glucometer: One Touch Ultra 2  Pt's meals are: - Breakfast: oatmeal or sausage bisquit - Lunch: chicken + fries, salads - Dinner: meat + veggies + starch - Snacks: no sodas; tea; crystal lite; chips, candy  - + mild CKD, last BUN/creatinine:  Lab Results  Component Value Date   BUN 11 01/04/2017   BUN 15 12/26/2016   CREATININE 1.05 01/04/2017   CREATININE 1.21 12/26/2016  On Lisinopril. - last set of lipids: Lab Results  Component Value Date   CHOL 249 (H) 01/04/2017   HDL 36.70 (L) 01/04/2017   LDLCALC NOT CALC 09/03/2015   LDLDIRECT 166.0 01/04/2017   TRIG 246.0  (H) 01/04/2017   CHOLHDL 7 01/04/2017  On Crestor. - last eye exam was in 2016 >> No DR - he has numbness and tingling in his feet.  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  ROS: Constitutional: no weight gain/no weight loss, + fatigue, no subjective hyperthermia, no subjective hypothermia Eyes: no blurry vision, no xerophthalmia ENT: + sore throat, no nodules palpated in throat, no dysphagia, no odynophagia, no hoarseness Cardiovascular: no CP/+ SOB/no palpitations/+ leg swelling Respiratory: no cough/+ SOB/+ wheezing Gastrointestinal: no N/no V/no D/no C/no acid reflux Musculoskeletal: + muscle aches/+ joint aches Skin: no rashes, no hair loss Neurological: no tremors/+ numbness/+ tingling/no dizziness  I reviewed pt's medications, allergies, PMH, social hx, family hx, and changes were documented in the history of present illness. Otherwise, unchanged from my initial visit note.  Past Medical History:  Diagnosis Date  . Erectile dysfunction following radical prostatectomy 10/23/2016  . Klebsiella sepsis ([place]) 03/25/2015  . Osteoarthritis 08/11/2012  . Peripheral neuropathy 08/11/2012  . Prostate cancer ([place])   . Smoker   . SUI (stress urinary incontinence), male 02/03/2013  . Uncontrolled diabetes mellitus with complications ([place]) 9/83/3825   Past Surgical History:  Procedure Laterality Date  . PROSTATE SURGERY  2013   Social History   Social History  .  Marital status: Married    Spouse name: N/A  . 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 Smoking status: Current Every Day Smoker    Packs/day: 0.25    Types: Cigarettes  . Smokeless tobacco: Never Used  . Alcohol use 0.0 oz/week     Comment: 5-6 beers per year  . Drug use: No   Current Outpatient Prescriptions on File Prior to Visit  Medication Sig Dispense Refill  . glucose blood (ONE TOUCH ULTRA TEST) test strip Use 3x a day 200 each 11  . Insulin  Glargine (LANTUS SOLOSTAR) 100 UNIT/ML Solostar Pen Inject 28 Units into the skin daily at 10 pm. 15 pen 3  . insulin lispro (HUMALOG KWIKPEN) 100 UNIT/ML KiwkPen Inject 0.15 mLs (15 Units total) into the skin 3 (three) times daily. 45 mL 3  . Insulin Pen Needle (BD PEN NEEDLE NANO U/F) 32G X 4 MM MISC use four times a day 400 each 3  . lisinopril (PRINIVIL,ZESTRIL) 5 MG tablet Take 1 tablet (5 mg total) by mouth daily. 90 tablet 3  . metFORMIN (GLUCOPHAGE) 1000 MG tablet take 1 tablet by mouth twice a day WITH A MEAL 180 tablet 3  . ONE TOUCH LANCETS MISC Use 3x a day 200 each 11  . aspirin EC 81 MG tablet Take 1 tablet (81 mg total) by mouth daily. (Patient not taking: Reported on 06/19/2017) 90 tablet 3  . rosuvastatin (CRESTOR) 40 MG tablet Take 1 tablet (40 mg total) by mouth daily. (Patient not taking: Reported on 06/19/2017) 90 tablet 3  . sildenafil (VIAGRA) 100 MG tablet Dispense Sildenafil 110mg ; Take 30-60 minutes prior to sexual activity daily prn     No current facility-administered medications on file prior to visit.    Allergies  Allergen Reactions  . Codeine Nausea And Vomiting   Family History  Problem Relation Age of Onset  . Hyperlipidemia Mother   . Heart disease Father   . Hyperlipidemia Sister    PE: BP 120/80 (BP Location: Left Arm, Patient Position: Sitting)   Pulse 97   Wt 245 lb (111.1 kg)   SpO2 97%   BMI 31.46 kg/m   Wt Readings from Last 3 Encounters:  06/19/17 245 lb (111.1 kg)  02/15/17 237 lb (107.5 kg)  01/04/17 242 lb 6.4 oz (110 kg)   Constitutional: overweight, in NAD Eyes: PERRLA, EOMI, no exophthalmos ENT: moist mucous membranes, no thyromegaly, no cervical lymphadenopathy Cardiovascular: tachycardia, RR, No MRG Respiratory: CTA B Gastrointestinal: abdomen soft, NT, ND, BS+ Musculoskeletal: no deformities, strength intact in all 4 Skin: moist, warm, no rashes Neurological: no tremor with outstretched hands, DTR normal in all  4  ASSESSMENT: 1. DM2, insulin-dependent, uncontrolled, with complications - PN - mild CKD  2. Obesity class 1 BMI Classification:  < 18.5 underweight   18.5-24.9 normal weight   25.0-29.9 overweight   30.0-34.9 class I obesity   35.0-39.9 class II obesity   ? 40.0 class III obesity   PLAN:  1. Patient with long-standing, uncontrolled DM, with still poor control 2/2 dietary indiscretions and missed insulin doses. Sugras at home 200-400s mostly.  - I suspect that he has not only insulin resistance but also decreased insulin production >> need to check him for DM1 when sugars improve (for a C peptide check, Glu needs to be <200). - we discussed about adding Trulicity >> demonstrated pen use, explained benefits, poss SEs  and also mech. Of action. - will also increase his Lantus - I suggested a Freestyle Libre CGM >> he will see if covered - I suggested to:  Patient Instructions  Please continue: - Metformin 1000 mg 2x a day  Please increase: - Lantus 36 units after dinner  Please continue: - Humalog: 14 units for a smaller meal 16 units for a regular meal 18 units for a larger meal  Please start Trulicity 5.05 mg weekly. Let me know when you are close to running out to call in the higher dose to your pharmacy (1.5 mg).  Please come back for a follow-up appointment in 1.5 months.  - today, HbA1c is 10.5% (slightly lower) - continue checking sugars at different times of the day - check 3x a day, rotating checks - advised for yearly eye exams >> he  Needs a new one - refuses flu shot - Return to clinic in 1.5 mo with sugar log   2. Obesity class 1 - weight is higher - will start Trulicity >> this may help  Philemon Kingdom, MD PhD [facility] Endocrinology

## 2017-06-19 NOTE — Patient Instructions (Addendum)
Please continue: - Metformin 1000 mg 2x a day  Please increase: - Lantus 36 units after dinner  Please continue: - Humalog: 14 units for a smaller meal 16 units for a regular meal 18 units for a larger meal  Please start Trulicity 3.60 mg weekly. Let me know when you are close to running out to call in the higher dose to your pharmacy (1.5 mg).  Please come back for a follow-up appointment in 1.5 months.

## 2017-07-23 ENCOUNTER — Encounter: Payer: Self-pay | Admitting: Family Medicine

## 2017-07-23 ENCOUNTER — Ambulatory Visit (INDEPENDENT_AMBULATORY_CARE_PROVIDER_SITE_OTHER): Payer: 59 | Admitting: Family Medicine

## 2017-07-23 VITALS — BP 134/80 | HR 97 | Temp 98.0°F | Ht 74.0 in | Wt 249.0 lb

## 2017-07-23 DIAGNOSIS — R600 Localized edema: Secondary | ICD-10-CM

## 2017-07-23 DIAGNOSIS — E1165 Type 2 diabetes mellitus with hyperglycemia: Secondary | ICD-10-CM

## 2017-07-23 DIAGNOSIS — E538 Deficiency of other specified B group vitamins: Secondary | ICD-10-CM | POA: Insufficient documentation

## 2017-07-23 DIAGNOSIS — E785 Hyperlipidemia, unspecified: Secondary | ICD-10-CM | POA: Diagnosis not present

## 2017-07-23 DIAGNOSIS — IMO0002 Reserved for concepts with insufficient information to code with codable children: Secondary | ICD-10-CM

## 2017-07-23 DIAGNOSIS — F17200 Nicotine dependence, unspecified, uncomplicated: Secondary | ICD-10-CM

## 2017-07-23 DIAGNOSIS — Z8546 Personal history of malignant neoplasm of prostate: Secondary | ICD-10-CM | POA: Diagnosis not present

## 2017-07-23 DIAGNOSIS — E1142 Type 2 diabetes mellitus with diabetic polyneuropathy: Secondary | ICD-10-CM

## 2017-07-23 LAB — COMPREHENSIVE METABOLIC PANEL
ALT: 21 U/L (ref 0–53)
AST: 19 U/L (ref 0–37)
Albumin: 4.2 g/dL (ref 3.5–5.2)
Alkaline Phosphatase: 85 U/L (ref 39–117)
BUN: 11 mg/dL (ref 6–23)
CO2: 26 mEq/L (ref 19–32)
Calcium: 9.6 mg/dL (ref 8.4–10.5)
Chloride: 106 mEq/L (ref 96–112)
Creatinine, Ser: 1.06 mg/dL (ref 0.40–1.50)
GFR: 92.22 mL/min (ref >60.00)
Glucose, Bld: 97 mg/dL (ref 70–99)
Potassium: 3.9 mEq/L (ref 3.5–5.1)
Sodium: 142 mEq/L (ref 135–145)
Total Bilirubin: 0.4 mg/dL (ref 0.2–1.2)
Total Protein: 7.3 g/dL (ref 6.0–8.3)

## 2017-07-23 LAB — CBC WITH DIFFERENTIAL/PLATELET
Basophils Absolute: 0 10*3/uL (ref 0.0–0.1)
Basophils Relative: 0.6 % (ref 0.0–3.0)
Eosinophils Absolute: 0.1 10*3/uL (ref 0.0–0.7)
Eosinophils Relative: 1.1 % (ref 0.0–5.0)
HCT: 41 % (ref 39.0–52.0)
Hemoglobin: 13.2 g/dL (ref 13.0–17.0)
Lymphocytes Relative: 31.1 % (ref 12.0–46.0)
Lymphs Abs: 2 10*3/uL (ref 0.7–4.0)
MCHC: 32.1 g/dL (ref 30.0–36.0)
MCV: 95.4 fl (ref 78.0–100.0)
Monocytes Absolute: 0.5 10*3/uL (ref 0.1–1.0)
Monocytes Relative: 7.9 % (ref 3.0–12.0)
Neutro Abs: 3.8 10*3/uL (ref 1.4–7.7)
Neutrophils Relative %: 59.3 % (ref 43.0–77.0)
Platelets: 288 10*3/uL (ref 150.0–400.0)
RBC: 4.3 Mil/uL (ref 4.22–5.81)
RDW: 13.8 % (ref 11.5–15.5)
WBC: 6.3 10*3/uL (ref 4.0–10.5)

## 2017-07-23 LAB — PSA: PSA: 0.07 ng/mL — ABNORMAL LOW (ref 0.10–4.00)

## 2017-07-23 LAB — VITAMIN B12: Vitamin B-12: 139 pg/mL — ABNORMAL LOW (ref 211–911)

## 2017-07-23 LAB — POCT GLYCOSYLATED HEMOGLOBIN (HGB A1C): Hemoglobin A1C: 10.6

## 2017-07-23 NOTE — Progress Notes (Signed)
Wyatt Meyer is a 56 y.o. male is here for follow up.  History of Present Illness:   HPI:   1. Uncontrolled type 2 diabetes mellitus with peripheral neuropathy ([place]).   Current symptoms: no polyuria or polydipsia, no numbness, tingling or pain in extremities.  Taking medication compliantly without noted sided effects []   YES  [x]   NO  Home glucose monitoring in the range of  90s-200 Episodes of hypoglycemia? []   YES  [x]   NO Maintaining a diabetic diet? []   YES  [x]   NO Trying to exercise on a regular basis? []   YES  [x]   NO  On ACE inhibitor or angiotensin II receptor blocker? NOT TAKING On Aspirin? NOT TAKING   Lab Results  Component Value Date   HGBA1C 10.6 07/23/2017    Lab Results  Component Value Date   MICROALBUR 6.1 (H) 01/04/2017    Lab Results  Component Value Date   CHOL 249 (H) 01/04/2017   HDL 36.70 (L) 01/04/2017   LDLCALC NOT CALC 09/03/2015   LDLDIRECT 166.0 01/04/2017   TRIG 246.0 (H) 01/04/2017   CHOLHDL 7 01/04/2017     Wt Readings from Last 3 Encounters:  07/23/17 249 lb (112.9 kg)  06/19/17 245 lb (111.1 kg)  02/15/17 237 lb (107.5 kg)   BP Readings from Last 3 Encounters:  07/23/17 134/80  06/19/17 120/80  02/15/17 130/80   Lab Results  Component Value Date   CREATININE 1.05 01/04/2017    2. Hyperlipidemia. Stopped statin.    3. Smoker. Increased to 1 pack per two days due to stress (mother died a few weeks ago).    4. Lower extremity edema. Intermittent. Worse with standing throughout the day. Cardiovascular ROS: negative for - chest pain or irregular heartbeat. Some SOB with exertion.   5. History of prostate cancer. Needs local Urologist.    Health Maintenance Due  Topic Date Due  . OPHTHALMOLOGY EXAM  05/12/1969  . HIV Screening  05/12/1974  . TETANUS/TDAP  05/12/1978  . HEMOGLOBIN A1C  06/27/2017   Depression screen PHQ 2/9 07/23/2017 05/03/2016 04/04/2016  Decreased Interest 0 0 0  Down, Depressed, Hopeless 0 0 0    PHQ - 2 Score 0 0 0   PMHx, SurgHx, SocialHx, FamHx, Medications, and Allergies were reviewed in the Visit Navigator and updated as appropriate.   Patient Active Problem List   Diagnosis Date Noted  . Uncontrolled type 2 diabetes mellitus with peripheral neuropathy ([place]) 02/15/2017  . Smoker   . Erectile dysfunction following radical prostatectomy 10/23/2016  . Acute kidney injury ([place]) 03/23/2015  . Obesity 03/23/2015  . Hyperlipemia 03/23/2015  . History of prostate cancer 03/23/2015  . SUI (stress urinary incontinence), male 02/03/2013  . Osteoarthritis 08/11/2012  . Peripheral neuropathy 08/11/2012   Social History  Substance Use Topics  . Smoking status: Current Every Day Smoker    Packs/day: 0.33    Types: Cigarettes  . Smokeless tobacco: Never Used  . Alcohol use 0.0 oz/week     Comment: 1-2 beers per month   Current Medications and Allergies:   .  glucose blood (ONE TOUCH ULTRA TEST) test strip, Use 3x a day, Disp: 200 each, Rfl: 11 .  Insulin Glargine (LANTUS SOLOSTAR) 100 UNIT/ML Solostar Pen, Inject 36 Units into the skin daily at 10 pm., Disp: 15 pen, Rfl: 3 .  insulin lispro (HUMALOG KWIKPEN) 100 UNIT/ML KiwkPen, Inject 0.15 mLs (15 Units total) into the skin 3 (three) times daily., Disp: 45  mL, Rfl: 3 .  Insulin Pen Needle (BD PEN NEEDLE NANO U/F) 32G X 4 MM MISC, use four times a day, Disp: 400 each, Rfl: 3 .  metFORMIN (GLUCOPHAGE) 1000 MG tablet, take 1 tablet by mouth twice a day WITH A MEAL, Disp: 180 tablet, Rfl: 3 .  ONE TOUCH LANCETS MISC, Use 3x a day, Disp: 200 each, Rfl: 11 .  sildenafil (VIAGRA) 100 MG tablet, Dispense Sildenafil 110mg ; Take 30-60 minutes prior to sexual activity daily prn, Disp: , Rfl:     Allergies  Allergen Reactions  . Codeine Nausea And Vomiting   Review of Systems   Pertinent items are noted in the HPI. Otherwise, ROS is negative.  Vitals:   Vitals:   07/23/17 0739  BP: 134/80  Pulse: 97  Temp: 98 F (36.7 C)   TempSrc: Oral  SpO2: 99%  Weight: 249 lb (112.9 kg)  Height: 6\' 2"  (1.88 m)     Body mass index is 31.97 kg/m.   Physical Exam:   Physical Exam  Constitutional: He is oriented to person, place, and time. He appears well-developed and well-nourished. No distress.  HENT:  Head: Normocephalic and atraumatic.  Right Ear: External ear normal.  Left Ear: External ear normal.  Nose: Nose normal.  Mouth/Throat: Oropharynx is clear and moist.  Eyes: Pupils are equal, round, and reactive to light. Conjunctivae and EOM are normal.  Neck: Normal range of motion. Neck supple.  Cardiovascular: Normal rate, regular rhythm, normal heart sounds and intact distal pulses.   Pulmonary/Chest: Effort normal and breath sounds normal.  Abdominal: Soft. Bowel sounds are normal.  Musculoskeletal: Normal range of motion.  Neurological: He is alert and oriented to person, place, and time.  Skin: Skin is warm and dry.  Psychiatric: He has a normal mood and affect. His behavior is normal. Judgment and thought content normal.  Nursing note and vitals reviewed.   Results for orders placed or performed in visit on 07/23/17  POCT glycosylated hemoglobin (Hb A1C)  Result Value Ref Range   Hemoglobin A1C 10.6    Assessment and Plan:   [name] was seen today for follow-up.  Diagnoses and all orders for this visit:  Uncontrolled type 2 diabetes mellitus with peripheral neuropathy ([place]) Comments: Discussed Trulicity recommended by Endocrine and benefits. He will trial Trulicity. Samples provided.  Orders: -     POCT glycosylated hemoglobin (Hb A1C) -     CBC with Differential/Platelet -     Comprehensive metabolic panel  Hyperlipidemia, unspecified hyperlipidemia type Comments: He will restart his statin.   Smoker Comments: Precontemplative stage.   Lower extremity edema Comments: Minimal today. Associated with SOB with exertion. High risk for CAD, CHF. ECHO ordered today. Orders: -      ECHOCARDIOGRAM COMPLETE; Future  B12 deficiency Comments: Patient on Metformin. Will check B12.  Orders: -     Vitamin B12  History of prostate cancer Comments: PSA today. Referral to [facility].  Orders: -     PSA -     Ambulatory referral to Urology   . Reviewed expectations re: course of current medical issues. . Discussed self-management of symptoms. . Outlined signs and symptoms indicating need for more acute intervention. . Patient verbalized understanding and all questions were answered. [name] Health Maintenance issues including appropriate healthy diet, exercise, and smoking avoidance were discussed with patient. . See orders for this visit as documented in the electronic medical record. . Patient received an After Visit Summary.  Briscoe Deutscher, DO  [place] [place] 07/23/2017  Future Appointments Date Time Provider [place]  08/13/2017 8:15 AM Philemon Kingdom, MD [id number] None

## 2017-07-25 ENCOUNTER — Ambulatory Visit (INDEPENDENT_AMBULATORY_CARE_PROVIDER_SITE_OTHER): Payer: 59 | Admitting: Family Medicine

## 2017-07-25 DIAGNOSIS — E538 Deficiency of other specified B group vitamins: Secondary | ICD-10-CM

## 2017-07-25 MED ORDER — CYANOCOBALAMIN 1000 MCG/ML IJ SOLN
1000.0000 ug | Freq: Once | INTRAMUSCULAR | Status: AC
  Administered 2017-07-25: 1000 ug via INTRAMUSCULAR

## 2017-07-25 NOTE — Progress Notes (Signed)
Pt tolerated his b12 injection well. He will schedule next week for his 2nd shot.

## 2017-07-29 ENCOUNTER — Encounter: Payer: Self-pay | Admitting: Family Medicine

## 2017-07-29 ENCOUNTER — Ambulatory Visit (HOSPITAL_COMMUNITY): Payer: 59 | Attending: Family Medicine

## 2017-07-29 ENCOUNTER — Other Ambulatory Visit: Payer: Self-pay

## 2017-07-29 DIAGNOSIS — E119 Type 2 diabetes mellitus without complications: Secondary | ICD-10-CM | POA: Insufficient documentation

## 2017-07-29 DIAGNOSIS — E785 Hyperlipidemia, unspecified: Secondary | ICD-10-CM | POA: Insufficient documentation

## 2017-07-29 DIAGNOSIS — Z720 Tobacco use: Secondary | ICD-10-CM | POA: Diagnosis not present

## 2017-07-29 DIAGNOSIS — R600 Localized edema: Secondary | ICD-10-CM

## 2017-07-29 LAB — ECHOCARDIOGRAM COMPLETE
Area-P 1/2: 5.5 cm2
E decel time: 137 msec
E/e' ratio: 6.17
FS: 25 % — AB (ref 28–44)
IVS/LV PW RATIO, ED: 0.92
LA ID, A-P, ES: 35 mm
LA diam end sys: 35 mm
LA diam index: 1.42 cm/m2
LA vol A4C: 38.9 ml
LA vol index: 17.3 mL/m2
LA vol: 42.4 mL
LV E/e' medial: 6.17
LV E/e'average: 6.17
LV PW d: 13 mm — AB (ref 0.6–1.1)
LV e' LATERAL: 9.68 cm/s
LVOT SV: 91 mL
LVOT VTI: 26.2 cm
LVOT area: 3.46 cm2
LVOT diameter: 21 mm
LVOT peak grad rest: 5 mmHg
LVOT peak vel: 114 cm/s
Lateral S' vel: 14.4 cm/s
MV Dec: 137
MV pk A vel: 80.5 m/s
MV pk E vel: 59.7 m/s
P 1/2 time: 40 ms
Peak grad: 202 mmHg
RV sys press: 19 mmHg
Reg peak vel: 202 cm/s
TDI e' lateral: 9.68
TDI e' medial: 5.87
TR max vel: 202 cm/s

## 2017-08-01 ENCOUNTER — Ambulatory Visit (INDEPENDENT_AMBULATORY_CARE_PROVIDER_SITE_OTHER): Payer: 59 | Admitting: Family Medicine

## 2017-08-01 DIAGNOSIS — E538 Deficiency of other specified B group vitamins: Secondary | ICD-10-CM | POA: Diagnosis not present

## 2017-08-01 MED ORDER — CYANOCOBALAMIN 1000 MCG/ML IJ SOLN
1000.0000 ug | Freq: Once | INTRAMUSCULAR | Status: AC
  Administered 2017-08-01: 1000 ug via INTRAMUSCULAR

## 2017-08-01 NOTE — Progress Notes (Signed)
Noted and agree.Wyatt Meyer

## 2017-08-01 NOTE — Progress Notes (Signed)
Patient tolerated B12 injection without difficulty.  Scheduled for next injection on 08/08/2017.

## 2017-08-01 NOTE — Progress Notes (Signed)
Noted and agree. Wyatt Meyer

## 2017-08-08 ENCOUNTER — Ambulatory Visit (INDEPENDENT_AMBULATORY_CARE_PROVIDER_SITE_OTHER): Payer: 59 | Admitting: *Deleted

## 2017-08-08 DIAGNOSIS — E538 Deficiency of other specified B group vitamins: Secondary | ICD-10-CM | POA: Diagnosis not present

## 2017-08-08 MED ORDER — CYANOCOBALAMIN 1000 MCG/ML IJ SOLN
1000.0000 ug | Freq: Once | INTRAMUSCULAR | Status: AC
  Administered 2017-08-08: 1000 ug via INTRAMUSCULAR

## 2017-08-08 NOTE — Progress Notes (Signed)
Noted and agree. Wyatt Meyer

## 2017-08-08 NOTE — Progress Notes (Signed)
Pt presented to the office today for his 3rd B12 Injection. Pt tolerated well. Pt told to return in one week for next injection. Pt verbalized understanding.

## 2017-08-13 ENCOUNTER — Ambulatory Visit (INDEPENDENT_AMBULATORY_CARE_PROVIDER_SITE_OTHER): Payer: 59 | Admitting: Internal Medicine

## 2017-08-13 ENCOUNTER — Encounter: Payer: Self-pay | Admitting: Internal Medicine

## 2017-08-13 VITALS — BP 134/82 | HR 89 | Wt 255.0 lb

## 2017-08-13 DIAGNOSIS — E1142 Type 2 diabetes mellitus with diabetic polyneuropathy: Secondary | ICD-10-CM

## 2017-08-13 DIAGNOSIS — E669 Obesity, unspecified: Secondary | ICD-10-CM | POA: Diagnosis not present

## 2017-08-13 DIAGNOSIS — Z6831 Body mass index (BMI) 31.0-31.9, adult: Secondary | ICD-10-CM

## 2017-08-13 DIAGNOSIS — IMO0002 Reserved for concepts with insufficient information to code with codable children: Secondary | ICD-10-CM

## 2017-08-13 DIAGNOSIS — E1165 Type 2 diabetes mellitus with hyperglycemia: Secondary | ICD-10-CM

## 2017-08-13 MED ORDER — INSULIN LISPRO 100 UNIT/ML (KWIKPEN): 14.0000 [IU] | PEN_INJECTOR | Freq: Three times a day (TID) | SUBCUTANEOUS | 3 refills | Status: DC

## 2017-08-13 MED ORDER — DULAGLUTIDE 1.5 MG/0.5ML ~~LOC~~ SOAJ: 1.5000 mg | SUBCUTANEOUS | 3 refills | Status: DC

## 2017-08-13 NOTE — Patient Instructions (Addendum)
Please continue: - Metformin 1000 mg 2x a day - Lantus 36 units after dinner - Humalog - take this 15 min before meals: 14 units for a smaller meal 16 units for a regular meal 18 units for a larger meal  Please increase: - Trulicity 1.5 mg weekly  Please come back for a follow-up appointment in 3 months.

## 2017-08-13 NOTE — Progress Notes (Signed)
Patient ID: Wyatt Meyer, male   DOB: 1961/08/17, 56 y.o.   MRN: [id number]   HPI: Wyatt Meyer is a 56 y.o.-year-old male, returning for f/u for DM2, dx in  2012 insulin-dependent in 2015, uncontrolled, with complications (mild CKD, PN). Last OV 2 months ago.  Last hemoglobin A1c was: Lab Results  Component Value Date   HGBA1C 10.6 07/23/2017   HGBA1C 11.3 (H) 12/26/2016   HGBA1C >14.0 04/04/2016   HGBA1C 11.7 01/02/2016   HGBA1C 13.4 09/03/2015   HGBA1C 13.4 (A) 09/03/2015   HGBA1C 12.1 (H) 03/23/2015   Pt was on a regimen of: - Metformin 1000 mg 2x a day, with meals >> tolerates it well - Lantus 28 units at bedtime >> actually takes it 5/7 days (forgets) - Humalog 16 units 3x a day, before meals >> 1-2x a day He tried [employer].  Pt was telling me at last visit: "If I take the regimen as prescribed, my sugars are 110-120".  Now on: - Metformin 1000 mg 2x a day - Trulicity 8.52 mg weekly - started 06/2017 (3 weeks ago) - Lantus 36 units after dinner - Humalog: skips less doses 14 units for a smaller meal 16 units for a regular meal 18 units for a larger meal  Pt checks his sugars 1-2x a day: - am: 250-300s >> 99, 200-300s >> improving:300-349, but more recently 187-251 - 2h after b'fast: n/c - before lunch: 413, 431 >> 131 - 2h after lunch: n/c >> 380-443 >> 287-466 - before dinner: n/c - 2h after dinner: n/c >> 193-428 - bedtime: n/c - nighttime: n/c Lowest sugar was 90 >> 99 >> 131;  Unclear if he has hypoglycemia awareness. Highest sugar was 500-600 >> 498 >> 466  Glucometer: One Touch Ultra 2  Pt's meals are: - Breakfast: oatmeal or sausage bisquit - Lunch: chicken + fries, salads - Dinner: meat + veggies + starch - Snacks: no sodas; tea; crystal lite; chips, candy  Since last visit, he started walking twice a week.  -+ Mild CKD, last BUN/creatinine:  Lab Results  Component Value Date   BUN 11 07/23/2017   BUN 11 01/04/2017   CREATININE 1.06  07/23/2017   CREATININE 1.05 01/04/2017  On lisinopril. -  + HL; last set of lipids: Lab Results  Component Value Date   CHOL 249 (H) 01/04/2017   HDL 36.70 (L) 01/04/2017   LDLCALC NOT CALC 09/03/2015   LDLDIRECT 166.0 01/04/2017   TRIG 246.0 (H) 01/04/2017   CHOLHDL 7 01/04/2017  On Crestor. - last eye exam was in 2016 >> No DR. Has not scheduled this. - + numbness and tingling in his feet.  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  ROS: Constitutional: no weight gain/no weight loss, + fatigue, no subjective hyperthermia, no subjective hypothermia Eyes: + blurry vision, no xerophthalmia ENT: no sore throat, no nodules palpated in throat, no dysphagia, no odynophagia, no hoarseness Cardiovascular: no CP/no SOB/no palpitations/+ leg swelling Respiratory: no cough/no SOB/no wheezing Gastrointestinal: no N/no V/no D/no C/no acid reflux Musculoskeletal: no muscle aches/+ joint aches Skin: no rashes, no hair loss Neurological: no tremors/+ numbness/+ tingling/no dizziness +diff with erections  I reviewed pt's medications, allergies, PMH, social hx, family hx, and changes were documented in the history of present illness. Otherwise, unchanged from my initial visit note.  Past Medical History:  Diagnosis Date  . Erectile dysfunction following radical prostatectomy 10/23/2016  . Klebsiella sepsis ([place]) 03/25/2015  . Osteoarthritis 08/11/2012  .  Peripheral neuropathy 08/11/2012  . Prostate cancer ([place])   . Smoker   . SUI (stress urinary incontinence), male 02/03/2013  . Uncontrolled diabetes mellitus with complications ([place]) 3/71/6967   Past Surgical History:  Procedure Laterality Date  . PROSTATE SURGERY  2013   Social History   Social History  . Marital status: Married    Spouse name: N/A  . 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 Smoking status: Current Every Day Smoker    Packs/day: 0.25    Types:  Cigarettes  . Smokeless tobacco: Never Used  . Alcohol use 0.0 oz/week     Comment: 5-6 beers per year  . Drug use: No   Current Outpatient Medications on File Prior to Visit  Medication Sig Dispense Refill  . glucose blood (ONE TOUCH ULTRA TEST) test strip Use 3x a day 200 each 11  . Insulin Glargine (LANTUS SOLOSTAR) 100 UNIT/ML Solostar Pen Inject 36 Units into the skin daily at 10 pm. 15 pen 3  . insulin lispro (HUMALOG KWIKPEN) 100 UNIT/ML KiwkPen Inject 0.15 mLs (15 Units total) into the skin 3 (three) times daily. 45 mL 3  . Insulin Pen Needle (BD PEN NEEDLE NANO U/F) 32G X 4 MM MISC use four times a day 400 each 3  . metFORMIN (GLUCOPHAGE) 1000 MG tablet take 1 tablet by mouth twice a day WITH A MEAL 180 tablet 3  . ONE TOUCH LANCETS MISC Use 3x a day 200 each 11  . sildenafil (VIAGRA) 100 MG tablet Dispense Sildenafil 110mg ; Take 30-60 minutes prior to sexual activity daily prn     No current facility-administered medications on file prior to visit.    Allergies  Allergen Reactions  . Codeine Nausea And Vomiting   Family History  Problem Relation Age of Onset  . Hyperlipidemia Mother   . Heart disease Father   . Hyperlipidemia Sister    PE: BP 134/82 (BP Location: Left Arm, Patient Position: Sitting)   Pulse 89   Wt 255 lb (115.7 kg)   SpO2 98%   BMI 32.74 kg/m  Wt Readings from Last 3 Encounters:  08/13/17 255 lb (115.7 kg)  07/23/17 249 lb (112.9 kg)  06/19/17 245 lb (111.1 kg)   Constitutional: overweight, in NAD Eyes: PERRLA, EOMI, no exophthalmos ENT: moist mucous membranes, no thyromegaly, no cervical lymphadenopathy Cardiovascular: RRR, No MRG Respiratory: CTA B Gastrointestinal: abdomen soft, NT, ND, BS+ Musculoskeletal: no deformities, strength intact in all 4 Skin: moist, warm, no rashes Neurological: no tremor with outstretched hands, DTR normal in all 4  ASSESSMENT: 1. DM2, insulin-dependent, uncontrolled, with complications - PN - mild  CKD  2. Obesity class 1 BMI Classification:  < 18.5 underweight   18.5-24.9 normal weight   25.0-29.9 overweight   30.0-34.9 class I obesity   35.0-39.9 class II obesity   ? 40.0 class III obesity   PLAN:  1. Patient with long-standing, uncontrolled diabetes, with slightly improved control in the last week (we started Trulicity 3 weeks ago).  He continues on his basal-bolus insulin and metformin.  Since last visit, he improved his compliance with his insulin and now has alarms on his phone when he needs to take the insulin.  He still has problems getting to take the insulin before a meal, and if he misses the preprandial dose, his sugars would be 400 after the meal.  We again discussed the need to move  the insulin to at least 15 minutes before eating.   - We will also increase Trulicity to 1.5 mg weekly - I suspect that he has not only insulin resistance but also decreased insulin production >> we need to check him for type 1 diabetes after sugars improve (for a C peptide check, Glu needs to be lower than 200). - I again suggested a freestyle libre CGM >> refuses for now - I suggested to:  Patient Instructions  Please continue: - Metformin 1000 mg 2x a day - Lantus 36 units after dinner - Humalog - take this 15 min before meals: 14 units for a smaller meal 16 units for a regular meal 18 units for a larger meal  Please increase: - Trulicity 1.5 mg weekly  Please come back for a follow-up appointment in 3 months.  - continue checking sugars at different times of the day - check 3x a day, rotating checks - he is not UTD with eye exams >> again advised to schedule - refused flu shot - Return to clinic in 3 mo with sugar log   2. Obesity class 1 - Worse: gained 6 pounds since last visit, despite starting Trulicity - will increase Trulicity dose which should help with both diabetes and weight loss  Philemon Kingdom, MD PhD [facility] Endocrinology

## 2017-09-25 ENCOUNTER — Telehealth: Payer: Self-pay | Admitting: Surgical

## 2017-09-25 ENCOUNTER — Other Ambulatory Visit: Payer: Self-pay | Admitting: Family Medicine

## 2017-09-25 ENCOUNTER — Ambulatory Visit (INDEPENDENT_AMBULATORY_CARE_PROVIDER_SITE_OTHER): Payer: 59 | Admitting: Family Medicine

## 2017-09-25 ENCOUNTER — Encounter: Payer: Self-pay | Admitting: Family Medicine

## 2017-09-25 VITALS — BP 128/66 | HR 106 | Temp 98.6°F | Ht 74.0 in | Wt 251.6 lb

## 2017-09-25 DIAGNOSIS — E782 Mixed hyperlipidemia: Secondary | ICD-10-CM | POA: Diagnosis not present

## 2017-09-25 DIAGNOSIS — E1165 Type 2 diabetes mellitus with hyperglycemia: Secondary | ICD-10-CM | POA: Diagnosis not present

## 2017-09-25 DIAGNOSIS — R809 Proteinuria, unspecified: Secondary | ICD-10-CM | POA: Diagnosis not present

## 2017-09-25 DIAGNOSIS — R202 Paresthesia of skin: Secondary | ICD-10-CM

## 2017-09-25 DIAGNOSIS — E1142 Type 2 diabetes mellitus with diabetic polyneuropathy: Secondary | ICD-10-CM | POA: Diagnosis not present

## 2017-09-25 DIAGNOSIS — Z1211 Encounter for screening for malignant neoplasm of colon: Secondary | ICD-10-CM | POA: Diagnosis not present

## 2017-09-25 DIAGNOSIS — E538 Deficiency of other specified B group vitamins: Secondary | ICD-10-CM

## 2017-09-25 DIAGNOSIS — F17210 Nicotine dependence, cigarettes, uncomplicated: Secondary | ICD-10-CM

## 2017-09-25 DIAGNOSIS — E669 Obesity, unspecified: Secondary | ICD-10-CM | POA: Diagnosis not present

## 2017-09-25 DIAGNOSIS — Z6831 Body mass index (BMI) 31.0-31.9, adult: Secondary | ICD-10-CM | POA: Diagnosis not present

## 2017-09-25 DIAGNOSIS — IMO0002 Reserved for concepts with insufficient information to code with codable children: Secondary | ICD-10-CM

## 2017-09-25 MED ORDER — CYANOCOBALAMIN 1000 MCG/ML IJ SOLN: INTRAMUSCULAR | 15 refills | Status: DC

## 2017-09-25 MED ORDER — CONTINUOUS BLOOD GLUC RECEIVER DEVI: 1.0000 | Freq: Once | 0 refills | Status: AC

## 2017-09-25 MED ORDER — ATORVASTATIN CALCIUM 10 MG PO TABS
10.0000 mg | ORAL_TABLET | Freq: Every day | ORAL | 3 refills | Status: DC
Start: 2017-09-25 — End: 2018-01-17

## 2017-09-25 MED ORDER — LISINOPRIL 5 MG PO TABS: 5.0000 mg | ORAL_TABLET | Freq: Every day | ORAL | 3 refills | Status: DC

## 2017-09-25 MED ORDER — CONTINUOUS BLOOD GLUC SENSOR MISC: 1.0000 | 0 refills | Status: DC

## 2017-09-25 MED ORDER — CYANOCOBALAMIN 1000 MCG/ML IJ SOLN
1000.0000 ug | Freq: Once | INTRAMUSCULAR | Status: AC
  Administered 2017-09-25: 1000 ug via INTRAMUSCULAR

## 2017-09-25 NOTE — Progress Notes (Signed)
Wyatt Meyer is a 57 y.o. male is here for follow up.  History of Present Illness:   [name] CMA acting as scribe for Dr. Juleen China.  HPI: Patient comes in today for follow up for his diabetes.   Diabetes: Patient stated that his blood sugars have been going down. He stated that he stopped taking his Trulicity for a couple of weeks due to having some numbness in his feet. He stopped the Trulicity to see if this helped his symptoms. He stated that he had no relief of symptoms so he started back.   Health Maintenance Due  Topic Date Due  . OPHTHALMOLOGY EXAM  05/12/1969  . TETANUS/TDAP  05/12/1978   Depression screen PHQ 2/9 07/23/2017 05/03/2016 04/04/2016  Decreased Interest 0 0 0  Down, Depressed, Hopeless 0 0 0  PHQ - 2 Score 0 0 0   PMHx, SurgHx, SocialHx, FamHx, Medications, and Allergies were reviewed in the Visit Navigator and updated as appropriate.   Patient Active Problem List   Diagnosis Date Noted  . Vitamin B12 deficiency 07/23/2017  . Uncontrolled type 2 diabetes mellitus with peripheral neuropathy ([place]) 02/15/2017  . Smoker   . Erectile dysfunction following radical prostatectomy 10/23/2016  . Acute kidney injury ([place]) 03/23/2015  . Obesity 03/23/2015  . Hyperlipemia 03/23/2015  . History of prostate cancer 03/23/2015  . SUI (stress urinary incontinence), male 02/03/2013  . Osteoarthritis 08/11/2012  . Peripheral neuropathy 08/11/2012   Social History   Tobacco Use  . Smoking status: Current Every Day Smoker    Packs/day: 0.33    Types: Cigarettes  . Smokeless tobacco: Never Used  Substance Use Topics  . Alcohol use: Yes    Alcohol/week: 0.0 oz    Comment: 1-2 beers per month  . Drug use: No   Current Medications and Allergies:   .  Dulaglutide (TRULICITY) 1.5 KK/9.3GH SOPN, Inject 1.5 mg once a week into the skin., Disp: 12 pen, Rfl: 3 .  glucose blood (ONE TOUCH ULTRA TEST) test strip, Use 3x a day, Disp: 200 each, Rfl: 11 .  Insulin  Glargine (LANTUS SOLOSTAR) 100 UNIT/ML Solostar Pen, Inject 36 Units into the skin daily at 10 pm. .  insulin lispro (HUMALOG KWIKPEN) 100 UNIT/ML KiwkPen, Inject 0.14-0.18 mLs (14-18 Units total) 3 (three) times daily into the skin., Disp: 45 mL, Rfl: 3 .  Insulin Pen Needle (BD PEN NEEDLE NANO U/F) 32G X 4 MM MISC, use four times a day, Disp: 400 each, Rfl: 3 .  metFORMIN (GLUCOPHAGE) 1000 MG tablet, take 1 tablet by mouth twice a day WITH A MEAL, Disp: 180 tablet, Rfl: 3 .  ONE TOUCH LANCETS MISC, Use 3x a day, Disp: 200 each, Rfl: 11 .  sildenafil (VIAGRA) 100 MG tablet, Dispense Sildenafil 110mg ; Take 30-60 minutes prior to sexual activity daily prn, Disp: , Rfl:     Allergies  Allergen Reactions  . Codeine Nausea And Vomiting   Review of Systems   Pertinent items are noted in the HPI. Otherwise, ROS is negative.  Vitals:   Vitals:   09/25/17 0732  BP: 128/66  Pulse: (!) 106  Temp: 98.6 F (37 C)  TempSrc: Oral  SpO2: 99%  Weight: 251 lb 9.6 oz (114.1 kg)  Height: 6\' 2"  (1.88 m)     Body mass index is 32.3 kg/m.  Physical Exam:   Physical Exam  Constitutional: He is oriented to person, place, and time. He appears well-developed and well-nourished. No distress.  HENT:  Head: Normocephalic and atraumatic.  Right Ear: External ear normal.  Left Ear: External ear normal.  Nose: Nose normal.  Mouth/Throat: Oropharynx is clear and moist.  Eyes: Conjunctivae and EOM are normal. Pupils are equal, round, and reactive to light.  Neck: Normal range of motion. Neck supple.  Cardiovascular: Normal rate, regular rhythm, normal heart sounds and intact distal pulses.  Pulmonary/Chest: Effort normal and breath sounds normal.  Abdominal: Soft. Bowel sounds are normal.  Musculoskeletal: Normal range of motion.  Neurological: He is alert and oriented to person, place, and time.  Skin: Skin is warm and dry.  Psychiatric: He has a normal mood and affect. His behavior is normal.  Judgment and thought content normal.  Nursing note and vitals reviewed.   Diabetic Foot Exam - Simple   Simple Foot Form Diabetic Foot exam was performed with the following findings:  Yes 09/25/2017  9:11 AM  Visual Inspection No deformities, no ulcerations, no other skin breakdown bilaterally:  Yes Sensation Testing Intact to touch and monofilament testing bilaterally:  Yes Pulse Check Posterior Tibialis and Dorsalis pulse intact bilaterally:  Yes Comments     Assessment and Plan:   1. Screening for colon cancer - Ambulatory referral to Gastroenterology  2. Uncontrolled type 2 diabetes mellitus with peripheral neuropathy (HCC) Current symptoms: no polyuria or polydipsia, no chest pain, dyspnea or TIA's, has dysesthesias in the feet.  Taking medication compliantly []   YES  [x]   NO  Episodes of hypoglycemia? []   YES  [x]   NO Maintaining a diabetic diet? []   YES  [x]   NO Trying to exercise on a regular basis? []   YES  [x]   NO  On ACE inhibitor or angiotensin II receptor blocker? []   YES  [x]   NO On Aspirin? []   YES  [x]   NO  Lab Results  Component Value Date   HGBA1C 10.6 07/23/2017    Lab Results  Component Value Date   MICROALBUR 6.1 (H) 01/04/2017    Lab Results  Component Value Date   CHOL 249 (H) 01/04/2017   HDL 36.70 (L) 01/04/2017   LDLCALC NOT CALC 09/03/2015   LDLDIRECT 166.0 01/04/2017   TRIG 246.0 (H) 01/04/2017   CHOLHDL 7 01/04/2017     Wt Readings from Last 3 Encounters:  09/25/17 251 lb 9.6 oz (114.1 kg)  08/13/17 255 lb (115.7 kg)  07/23/17 249 lb (112.9 kg)   BP Readings from Last 3 Encounters:  09/25/17 128/66  08/13/17 134/82  07/23/17 134/80   Lab Results  Component Value Date   CREATININE 1.06 07/23/2017   - CBC with Differential/Platelet; Future - Comprehensive metabolic panel; Future - Continuous Blood Gluc Sensor MISC; 1 each by Does not apply route as directed. Use as directed every 10 days. May dispense FreeStyle [employer] or similar.  Dispense: 1 each; Refill: 0 - Continuous Blood Gluc Receiver DEVI; 1 Device by Does not apply route once for 1 dose.  Dispense: 1 Device; Refill: 0  3. Class 1 obesity with serious comorbidity and body mass index (BMI) of 31.0 to 31.9 in adult, unspecified obesity type The patient is asked to make an attempt to improve diet and exercise patterns to aid in medical management of this problem.   - Comprehensive metabolic panel; Future  4. Vitamin B12 deficiency - cyanocobalamin (,VITAMIN B-12,) 1000 MCG/ML injection; 1000 mcg(1 mg) injection once a day for three days once a week for three weeks followed by 1000 mcg injection once a month for  three months.  Dispense: 1 mL; Refill: 15 - cyanocobalamin ((VITAMIN B-12)) injection 1,000 mcg - Vitamin B12; Future  5. Mixed hyperlipidemia Patient has not been taking his statin.  This was previously prescribed and I am not sure if he ever started the medication.  We will resend the statin.  Lipid panel ordered. - Lipid panel; Future  6. Paresthesias The patient complains of bilateral hand and feet paresthesias.  He does have long-standing elevated blood sugars.  He also has a known history of B12 deficiency.  We discussed the fact that both of these issues can cause paresthesias.  He will be more aggressive in replacing his B12.  We also discussed the importance of managing his diabetes better.  - cyanocobalamin (,VITAMIN B-12,) 1000 MCG/ML injection; 1000 mcg(1 mg) injection once a day for three days once a week for three weeks followed by 1000 mcg injection once a month for three months.  Dispense: 1 mL; Refill: 15 - cyanocobalamin ((VITAMIN B-12)) injection 1,000 mcg - CBC with Differential/Platelet; Future - Comprehensive metabolic panel; Future  7. Microalbuminuria Patient was previously instructed to start lisinopril.  He did not do this.  We will recall that in.  8. Cigarette nicotine dependence without complication The  patient was counseled on the dangers of tobacco use, and was advised to quit.  Reviewed strategies to maximize success, including removing cigarettes and smoking materials from environment, stress management, support of family/friends, written materials, local smoking cessation programs (1-800-QUIT-NOW and SMOKEFREE.GOV) and pharmacotherapy. Greater than 3 minutes were spent on counseling today.  . Reviewed expectations re: course of current medical issues. . Discussed self-management of symptoms. . Outlined signs and symptoms indicating need for more acute intervention. . Patient verbalized understanding and all questions were answered. [name] Health Maintenance issues including appropriate healthy diet, exercise, and smoking avoidance were discussed with patient. . See orders for this visit as documented in the electronic medical record. . Patient received an After Visit Summary.  CMA served as Education administrator during this visit. History, Physical, and Plan performed by medical provider. The above documentation has been reviewed and is accurate and complete. Briscoe Deutscher, D.O.   Briscoe Deutscher, DO [place], [place] [facility] 09/25/2017

## 2017-09-25 NOTE — Telephone Encounter (Signed)
Tried to call the Pharmacy and could not get nobody on the phone after 10 minutes. Will try to call again tomorrow.

## 2017-09-25 NOTE — Telephone Encounter (Signed)
See request from pharmacy for brand of glucometer

## 2017-09-25 NOTE — Telephone Encounter (Signed)
Copied from [place]. Topic: Quick Communication - See Telephone Encounter >> Sep 25, 2017  3:42 PM Vernona Rieger wrote: CRM for notification. See Telephone encounter for:   09/25/17.  Pharmacy called and said they received a script for a diabetic reader, they said it doesn't specify which brand. Fax number [phone] Call back [phone]

## 2017-09-25 NOTE — Telephone Encounter (Signed)
See note

## 2017-09-25 NOTE — Telephone Encounter (Signed)
Left message for patient to return call to schedule lab appointment from this morning.

## 2017-10-02 ENCOUNTER — Ambulatory Visit (INDEPENDENT_AMBULATORY_CARE_PROVIDER_SITE_OTHER): Payer: 59 | Admitting: *Deleted

## 2017-10-02 DIAGNOSIS — E538 Deficiency of other specified B group vitamins: Secondary | ICD-10-CM | POA: Diagnosis not present

## 2017-10-02 NOTE — Progress Notes (Signed)
Patient arrived for B12 injection. Patient brought his own B12 and I educated him on how to self administer. Patient states that he is not sure if he will be able to inject himself but he is also unsure about insurace coverage regarding office visits for injections. I advised him to cal his insurance today to find out coverage and if he decides he would like medication administered in office then he can call to schedule next visit.  1st B12 injection was 07/25/17 2nd B12 injection was 08/01/17 3rd B12 injection was 08/08/17 Office visit on 09/25/17 gives B12 admin orders per Dr Juleen China: 4. Vitamin B12 deficiency - cyanocobalamin (,VITAMIN B-12,) 1000 MCG/ML injection; 1000 mcg(1 mg) injection once a day for three days once a week for three weeks followed by 1000 mcg injection once a month for three months.  Dispense: 1 mL; Refill: 15 1st B12 injection was 09/25/17 2nd B12 injection was 10/02/16  Patient already off of prescribed schedule.  Dr Juleen China would you like to change his schedule?

## 2017-10-03 NOTE — Progress Notes (Signed)
Noted and agree. Wyatt Meyer

## 2017-10-04 ENCOUNTER — Telehealth: Payer: Self-pay | Admitting: Internal Medicine

## 2017-10-04 MED ORDER — INSULIN GLARGINE 100 UNIT/ML SOLOSTAR PEN: 36.0000 [IU] | PEN_INJECTOR | Freq: Every day | SUBCUTANEOUS | 3 refills | Status: DC

## 2017-10-04 NOTE — Telephone Encounter (Signed)
lantus pens need to be called to [facility] on [address] please

## 2017-10-04 NOTE — Telephone Encounter (Signed)
Sent to pharmacy 

## 2017-10-04 NOTE — Addendum Note (Signed)
Addended by: Drucilla Schmidt on: 10/04/2017 05:03 PM   Modules accepted: Orders

## 2017-10-15 ENCOUNTER — Ambulatory Visit: Payer: 59 | Admitting: Endocrinology

## 2017-10-16 ENCOUNTER — Ambulatory Visit: Payer: 59 | Admitting: Endocrinology

## 2017-10-16 ENCOUNTER — Ambulatory Visit: Payer: 59 | Admitting: Internal Medicine

## 2017-10-17 ENCOUNTER — Ambulatory Visit: Payer: 59 | Admitting: Internal Medicine

## 2017-10-28 ENCOUNTER — Encounter: Payer: Self-pay | Admitting: Family Medicine

## 2018-01-17 ENCOUNTER — Encounter: Payer: Self-pay | Admitting: Internal Medicine

## 2018-01-17 ENCOUNTER — Ambulatory Visit (INDEPENDENT_AMBULATORY_CARE_PROVIDER_SITE_OTHER): Payer: 59 | Admitting: Internal Medicine

## 2018-01-17 ENCOUNTER — Other Ambulatory Visit: Payer: Self-pay

## 2018-01-17 VITALS — BP 118/78 | HR 94 | Resp 18 | Ht 73.27 in | Wt 245.6 lb

## 2018-01-17 DIAGNOSIS — E1142 Type 2 diabetes mellitus with diabetic polyneuropathy: Secondary | ICD-10-CM | POA: Diagnosis not present

## 2018-01-17 DIAGNOSIS — E1165 Type 2 diabetes mellitus with hyperglycemia: Principal | ICD-10-CM

## 2018-01-17 DIAGNOSIS — E785 Hyperlipidemia, unspecified: Secondary | ICD-10-CM

## 2018-01-17 DIAGNOSIS — IMO0002 Reserved for concepts with insufficient information to code with codable children: Secondary | ICD-10-CM

## 2018-01-17 LAB — POCT GLYCOSYLATED HEMOGLOBIN (HGB A1C): Hemoglobin A1C: 10.3

## 2018-01-17 MED ORDER — METFORMIN HCL 1000 MG PO TABS: ORAL_TABLET | ORAL | 3 refills | Status: DC

## 2018-01-17 MED ORDER — ATORVASTATIN CALCIUM 10 MG PO TABS: 10.0000 mg | ORAL_TABLET | Freq: Every day | ORAL | 3 refills | Status: DC

## 2018-01-17 MED ORDER — INSULIN LISPRO 100 UNIT/ML (KWIKPEN): 14.0000 [IU] | PEN_INJECTOR | Freq: Three times a day (TID) | SUBCUTANEOUS | 3 refills | Status: DC

## 2018-01-17 MED ORDER — ONETOUCH LANCETS MISC: 11 refills | Status: AC

## 2018-01-17 MED ORDER — INSULIN GLARGINE 100 UNIT/ML SOLOSTAR PEN: 36.0000 [IU] | PEN_INJECTOR | Freq: Every day | SUBCUTANEOUS | 3 refills | Status: DC

## 2018-01-17 MED ORDER — INSULIN PEN NEEDLE 32G X 4 MM MISC: 3 refills | Status: DC

## 2018-01-17 MED ORDER — CONTINUOUS BLOOD GLUC SENSOR MISC: 1.0000 | 0 refills | Status: DC

## 2018-01-17 NOTE — Progress Notes (Signed)
Patient ID: Wyatt Meyer, male   DOB: 03-19-1961, 57 y.o.   MRN: [id number]   HPI: Wyatt Meyer is a 57 y.o.-year-old male, returning for f/u for DM2, dx in  2012 insulin-dependent in 2015, uncontrolled, with complications (mild CKD, PN). Last OV 5 months ago.  Since last visit, he did not check sugars consistently and he skipped many insulin doses.  He mentioned that when he took the whole dose of Humalog he had low blood sugars in the 60s.  However, reviewing the doses that he is taking right now, they are higher than the was recommended at last visit.  Last hemoglobin A1c was: Lab Results  Component Value Date   HGBA1C 10.6 07/23/2017   HGBA1C 11.3 (H) 12/26/2016   HGBA1C >14.0 04/04/2016   HGBA1C 11.7 01/02/2016   HGBA1C 13.4 09/03/2015   HGBA1C 13.4 (A) 09/03/2015   HGBA1C 12.1 (H) 03/23/2015   Pt was on a regimen of: - Metformin 1000 mg 2x a day, with meals >> tolerates it well - Lantus 28 units at bedtime  - Humalog 16 units 3x a day, before meals He tried JanuMet.  "If I take the regimen as prescribed, my sugars are 110-120".  Now on: - Metformin 1000 mg 2x a day - Trulicity 1.5 mg weekly -started 06/2017 - Lantus 36 units after dinner -misses it >2x a week - Humalog - 15 minutes before or after a meal 18-24 units before meals >> skipping doses as his sugars dropped to 60s on these doses   Pt checks his sugars 1-2 times a day: - am: 99, 200-300s >> improving:300-349, then 187-251 >> 180-250 - 2h after b'fast: n/c - before lunch: 413, 431 >> 131 >> n/c - 2h after lunch: 380-443 >> 287-466 >> n/c - before dinner: n/c - 2h after dinner: n/c >> 193-428 >> up to 400s - bedtime: n/c - nighttime: n/c Lowest sugar was 131 >> 60;   it is unclear which level he has hypoglycemia awareness. Highest sugar was 466 >> 400s.  Glucometer: One Touch Ultra 2  Pt's meals are: - Breakfast: oatmeal or sausage bisquit - Lunch: chicken + fries, salads - Dinner: meat + veggies +  starch - Snacks: no sodas; tea; crystal lite; chips, candy  Since last visit, he started walking twice a week.  -+ Mild CKD, last BUN/creatinine:  Lab Results  Component Value Date   BUN 11 07/23/2017   BUN 11 01/04/2017   CREATININE 1.06 07/23/2017   CREATININE 1.05 01/04/2017  On lisinopril -  + HL; last set of lipids: Lab Results  Component Value Date   CHOL 249 (H) 01/04/2017   HDL 36.70 (L) 01/04/2017   LDLCALC NOT CALC 09/03/2015   LDLDIRECT 166.0 01/04/2017   TRIG 246.0 (H) 01/04/2017   CHOLHDL 7 01/04/2017  On Crestor. - last eye exam was in 10/2017, no DR - + numbness and tingling in his feet.  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  ROS: Constitutional: no weight gain/no weight loss, no fatigue, no subjective hyperthermia, no subjective hypothermia Eyes: no blurry vision, no xerophthalmia ENT: no sore throat, no nodules palpated in throat, no dysphagia, no odynophagia, no hoarseness Cardiovascular: no CP/no SOB/no palpitations/no leg swelling Respiratory: no cough/no SOB/no wheezing Gastrointestinal: no N/no V/no D/no C/no acid reflux Musculoskeletal: no muscle aches/no joint aches Skin: no rashes, no hair loss Neurological: no tremors/+ numbness/+ tingling/no dizziness  I reviewed pt's medications, allergies, PMH, social hx, family hx, and changes  were documented in the history of present illness. Otherwise, unchanged from my initial visit note.  Past Medical History:  Diagnosis Date  . Erectile dysfunction following radical prostatectomy 10/23/2016  . Klebsiella sepsis ([place]) 03/25/2015  . Osteoarthritis 08/11/2012  . Peripheral neuropathy 08/11/2012  . Prostate cancer ([place])   . Smoker   . SUI (stress urinary incontinence), male 02/03/2013  . Uncontrolled diabetes mellitus with complications ([place]) 6/62/9476   Past Surgical History:  Procedure Laterality Date  . PROSTATE SURGERY  2013   Social History   Social History  . Marital status: Married     Spouse name: N/A  . 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 Smoking status: Current Every Day Smoker    Packs/day: 0.25    Types: Cigarettes  . Smokeless tobacco: Never Used  . Alcohol use 0.0 oz/week     Comment: 5-6 beers per year  . Drug use: No   Current Outpatient Medications on File Prior to Visit  Medication Sig Dispense Refill  . atorvastatin (LIPITOR) 10 MG tablet Take 1 tablet (10 mg total) by mouth daily. 90 tablet 3  . Continuous Blood Gluc Sensor MISC 1 each by Does not apply route as directed. Use as directed every 10 days. May dispense FreeStyle [employer] or similar. 1 each 0  . cyanocobalamin (,VITAMIN B-12,) 1000 MCG/ML injection 1000 mcg(1 mg) injection once a day for three days once a week for three weeks followed by 1000 mcg injection once a month for three months. 1 mL 15  . Dulaglutide (TRULICITY) 1.5 LY/6.5KP SOPN Inject 1.5 mg once a week into the skin. 12 pen 3  . glucose blood (ONE TOUCH ULTRA TEST) test strip Use 3x a day 200 each 11  . Insulin Glargine (LANTUS SOLOSTAR) 100 UNIT/ML Solostar Pen Inject 36 Units into the skin daily at 10 pm. 15 pen 3  . insulin lispro (HUMALOG KWIKPEN) 100 UNIT/ML KiwkPen Inject 0.14-0.18 mLs (14-18 Units total) 3 (three) times daily into the skin. 45 mL 3  . Insulin Pen Needle (BD PEN NEEDLE NANO U/F) 32G X 4 MM MISC use four times a day 400 each 3  . lisinopril (PRINIVIL,ZESTRIL) 5 MG tablet Take 1 tablet (5 mg total) by mouth daily. 90 tablet 3  . metFORMIN (GLUCOPHAGE) 1000 MG tablet take 1 tablet by mouth twice a day WITH A MEAL 180 tablet 3  . ONE TOUCH LANCETS MISC Use 3x a day 200 each 11  . sildenafil (VIAGRA) 100 MG tablet Dispense Sildenafil 110mg ; Take 30-60 minutes prior to sexual activity daily prn     No current facility-administered medications on file prior to visit.    Allergies  Allergen Reactions  . Codeine Nausea And Vomiting   Family  History  Problem Relation Age of Onset  . Hyperlipidemia Mother   . Heart disease Father   . Hyperlipidemia Sister    PE: BP 118/78   Pulse 94   Resp 18   Ht 6' 1.27" (1.861 m)   Wt 245 lb 9.6 oz (111.4 kg)   SpO2 97%   BMI 32.17 kg/m  Wt Readings from Last 3 Encounters:  01/17/18 245 lb 9.6 oz (111.4 kg)  09/25/17 251 lb 9.6 oz (114.1 kg)  08/13/17 255 lb (115.7 kg)   Constitutional: overweight, in NAD Eyes: PERRLA, EOMI, no exophthalmos ENT: moist mucous membranes, no thyromegaly, no cervical lymphadenopathy Cardiovascular: tachycardia, RR,  No MRG Respiratory: CTA B Gastrointestinal: abdomen soft, NT, ND, BS+ Musculoskeletal: no deformities, strength intact in all 4 Skin: moist, warm, no rashes Neurological: no tremor with outstretched hands, DTR normal in all 4  ASSESSMENT: 1. DM2, insulin-dependent, uncontrolled, with complications - PN - mild CKD  2. Obesity class 1 BMI Classification:  < 18.5 underweight   18.5-24.9 normal weight   25.0-29.9 overweight   30.0-34.9 class I obesity   35.0-39.9 class II obesity   ? 40.0 class III obesity   3. HL  PLAN:  1. Patient with long-standing, uncontrolled, type 2 diabetes, on metformin, basal-bolus insulin regimen, and more recently started GLP-1 receptor agonist.  At last visit, we increased the dose of Trulicity as the sugars were still high especially after meals.  He was missing insulin doses at last visits.  We discussed at last visit about the importance of taking the Humalog 15 minutes before a meal. -   At this visit, he is still not taking the Humalog consistently and also not taking it before the meals but at different times a relationship with a meal.  We discussed about the importance of doing so.  He was having alarms on the phones before and I advised him to restart these.  He is also missing Lantus doses and I strongly advised him to try his best not to forget to take it and I suggested to move this  before dinner to improve compliance.  Until he starts taking the medicines as advised, there is not a lot to do to improve his diabetes control.  However, since he is telling me that he is dropping his sugars if he takes the current doses of Humalog, will decrease the doses to the ones suggested at last visit. - I suspect that he has not only insulin resistance but also decreased insulin production >> we need to check him for type 1 diabetes after sugars improve (for a C peptide check, Glu needs to be lower than 200).  -I did suggest a freestyle libre CGM at previous visits but he refused. - I suggested to:  Patient Instructions  Please continue: - Metformin 1000 mg 2x a day - Trulicity 1.5 mg weekly - Lantus 36 units daily  Please take Lantus EVERY evening, maybe move it before dinner.  Decrease: - Humalog - 15 minutes before a meal 14 units for a smaller meal 16 units for a regular meal 18 units for a larger meal  Please take Humalog before EVERY meal.  Please come back for a follow-up appointment in 3 months.  - today, HbA1c is 10.3% (higher) - continue checking sugars at different times of the day - check 3x a day, rotating checks - advised for yearly eye exams >> he is UTD - Return to clinic in 3 mo with sugar log    2. Obesity class 1 -Continue Trulicity, which should help with weight loss, also -He did lose 6 pounds since last visit  3. HL - Reviewed latest lipid panel from 12/2016, LDL very high - Continues Crestor without side effects.   Philemon Kingdom, MD PhD [facility] Endocrinology

## 2018-01-17 NOTE — Patient Instructions (Addendum)
Please continue: - Metformin 1000 mg 2x a day - Trulicity 1.5 mg weekly - Lantus 36 units daily  Please take Lantus EVERY evening, maybe move it before dinner.  Decrease: - Humalog - 15 minutes before a meal 14 units for a smaller meal 16 units for a regular meal 18 units for a larger meal  Please take Humalog before EVERY meal.  Please come back for a follow-up appointment in 3 months.

## 2018-01-20 ENCOUNTER — Other Ambulatory Visit: Payer: Self-pay | Admitting: Internal Medicine

## 2018-05-22 ENCOUNTER — Ambulatory Visit: Payer: 59 | Admitting: Internal Medicine

## 2018-05-22 NOTE — Progress Notes (Deleted)
Patient ID: Wyatt Meyer, male   DOB: [birth year], 57 y.o.   MRN: [id number]   HPI: Wyatt Meyer is a 57 y.o.-year-old male, returning for f/u for DM2, dx in  2012 insulin-dependent in 2015, uncontrolled, with complications (mild CKD, PN). Last OV 4 months ago.  At last visit he was telling me that he was not checking sugars consistently and he was skipping many insulin doses.  Last hemoglobin A1c was: Lab Results  Component Value Date   HGBA1C 10.3 01/17/2018   HGBA1C 10.6 07/23/2017   HGBA1C 11.3 (H) 12/26/2016   HGBA1C >14.0 04/04/2016   HGBA1C 11.7 01/02/2016   HGBA1C 13.4 09/03/2015   HGBA1C 13.4 (A) 09/03/2015   HGBA1C 12.1 (H) 03/23/2015   Pt was on a regimen of: - Metformin 1000 mg 2x a day, with meals >> tolerates it well - Lantus 28 units at bedtime  - Humalog 16 units 3x a day, before meals He tried JanuMet.  "If I take the regimen as prescribed, my sugars are 110-120".  Now on: - Metformin 1000 mg 2x a day - Trulicity 1.5 mg weekly - Lantus 36 units daily - Humalog - 15 minutes before a meal 14 units for a smaller meal 16 units for a regular meal 18 units for a larger meal  Pt checks his sugars 1-2 times a day: - am: 300-349, then 187-251 >> 180-250 - 2h after b'fast: n/c - before lunch: 413, 431 >> 131 >> n/c - 2h after lunch: 380-443 >> 287-466 >> n/c - before dinner: n/c - 2h after dinner: n/c >> 193-428 >> up to 400s - bedtime: n/c - nighttime: n/c Lowest sugar was 131 >> 60 >> ***;   it is unclear at which level he has hypoglycemia awareness Highest sugar was 466 >> 400s >> ***.  Glucometer: One Touch Ultra 2  Pt's meals are: - Breakfast: oatmeal or sausage bisquit - Lunch: chicken + fries, salads - Dinner: meat + veggies + starch - Snacks: no sodas; tea; crystal lite; chips, candy  He is still walking 2x a day.  -+ mildCKD, last BUN/creatinine:  Lab Results  Component Value Date   BUN 11 07/23/2017   BUN 11 01/04/2017   CREATININE  1.06 07/23/2017   CREATININE 1.05 01/04/2017  On Lisinopril. -  + HL; last set of lipids: Lab Results  Component Value Date   CHOL 249 (H) 01/04/2017   HDL 36.70 (L) 01/04/2017   LDLCALC NOT CALC 09/03/2015   LDLDIRECT 166.0 01/04/2017   TRIG 246.0 (H) 01/04/2017   CHOLHDL 7 01/04/2017  On Crestor. - last eye exam was in 10/2017: No DR - + numbness and tingling in his feet.  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  ROS: Constitutional: no weight gain/no weight loss, no fatigue, no subjective hyperthermia, no subjective hypothermia Eyes: no blurry vision, no xerophthalmia ENT: no sore throat, no nodules palpated in throat, no dysphagia, no odynophagia, no hoarseness Cardiovascular: no CP/no SOB/no palpitations/no leg swelling Respiratory: no cough/no SOB/no wheezing Gastrointestinal: no N/no V/no D/no C/no acid reflux Musculoskeletal: no muscle aches/no joint aches Skin: no rashes, no hair loss Neurological: no tremors/+ numbness/+ tingling/no dizziness  I reviewed pt's medications, allergies, PMH, social hx, family hx, and changes were documented in the history of present illness. Otherwise, unchanged from my initial visit note.  Past Medical History:  Diagnosis Date  . Erectile dysfunction following radical prostatectomy 10/23/2016  . Klebsiella sepsis ([place]) 03/25/2015  . Osteoarthritis 08/11/2012  .  Peripheral neuropathy 08/11/2012  . Prostate cancer ([place])   . Smoker   . SUI (stress urinary incontinence), male 02/03/2013  . Uncontrolled diabetes mellitus with complications ([place]) 2/75/1700   Past Surgical History:  Procedure Laterality Date  . PROSTATE SURGERY  2013   Social History   Social History  . Marital status: Married    Spouse name: N/A  . 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 Smoking status: Current Every Day Smoker    Packs/day: 0.25    Types: Cigarettes  . Smokeless tobacco: Never Used   . Alcohol use 0.0 oz/week     Comment: 5-6 beers per year  . Drug use: No   Current Outpatient Medications on File Prior to Visit  Medication Sig Dispense Refill  . atorvastatin (LIPITOR) 10 MG tablet Take 1 tablet (10 mg total) by mouth daily. 90 tablet 3  . Continuous Blood Gluc Sensor MISC 1 each by Does not apply route as directed. Use as directed every 10 days. May dispense FreeStyle [employer] or similar. 1 each 0  . cyanocobalamin (,VITAMIN B-12,) 1000 MCG/ML injection 1000 mcg(1 mg) injection once a day for three days once a week for three weeks followed by 1000 mcg injection once a month for three months. 1 mL 15  . Dulaglutide (TRULICITY) 1.5 FV/4.9SW SOPN Inject 1.5 mg once a week into the skin. 12 pen 3  . glucose blood (ONE TOUCH ULTRA TEST) test strip Use 3x a day 200 each 11  . HUMALOG KWIKPEN 100 UNIT/ML KiwkPen INJECT 15 UNITS INTO THE SKIN THREE TIMES A DAY 12 mL 0  . Insulin Glargine (LANTUS SOLOSTAR) 100 UNIT/ML Solostar Pen Inject 36 Units into the skin daily at 10 pm. 15 pen 3  . Insulin Pen Needle (BD PEN NEEDLE NANO U/F) 32G X 4 MM MISC use four times a day 400 each 3  . lisinopril (PRINIVIL,ZESTRIL) 5 MG tablet Take 1 tablet (5 mg total) by mouth daily. 90 tablet 3  . metFORMIN (GLUCOPHAGE) 1000 MG tablet take 1 tablet by mouth twice a day WITH A MEAL 180 tablet 3  . ONE TOUCH LANCETS MISC Use 3x a day 200 each 11  . sildenafil (VIAGRA) 100 MG tablet Dispense Sildenafil 110mg ; Take 30-60 minutes prior to sexual activity daily prn     No current facility-administered medications on file prior to visit.    Allergies  Allergen Reactions  . Codeine Nausea And Vomiting   Family History  Problem Relation Age of Onset  . Hyperlipidemia Mother   . Heart disease Father   . Hyperlipidemia Sister    PE: There were no vitals taken for this visit. Wt Readings from Last 3 Encounters:  01/17/18 245 lb 9.6 oz (111.4 kg)  09/25/17 251 lb 9.6 oz (114.1 kg)   08/13/17 255 lb (115.7 kg)   Constitutional: overweight, in NAD Eyes: PERRLA, EOMI, no exophthalmos ENT: moist mucous membranes, no thyromegaly, no cervical lymphadenopathy Cardiovascular: RRR, No MRG Respiratory: CTA B Gastrointestinal: abdomen soft, NT, ND, BS+ Musculoskeletal: no deformities, strength intact in all 4 Skin: moist, warm, no rashes Neurological: no tremor with outstretched hands, DTR normal in all 4  ASSESSMENT: 1. DM2, insulin-dependent, uncontrolled, with complications - PN - mild CKD  2. Obesity class 1 BMI Classification:  < 18.5 underweight   18.5-24.9 normal weight   25.0-29.9 overweight   30.0-34.9 class I obesity  35.0-39.9 class II obesity   ? 40.0 class III obesity   3. HL  PLAN:  1. Patient with long-standing, uncontrolled, type 2 diabetes, on metformin, basal-bolus insulin regimen and also GLP-1 receptor agonist.  At last visit, he was missing many insulin doses including Lantus, so we discussed about improving compliance.  I advised him to move Lantus before dinner to make sure he does not forget it.  However, he mentioned that when he was taking the entire dose of Humalog as recommended, his sugars were lower.  Therefore, I advised him to decrease the Humalog doses  - I suspect that he also has decreased insulin production >> we need to check him for type 1 diabetes after sugars improve (for a C peptide check, Glu needs to be lower than 200).  However, for now, this is not emergent, since we would probably not change his treatment in case his insulin production is low  -He refused trying a freestyle libre CGM - I suggested to:  Patient Instructions  Please continue: - Metformin 1000 mg 2x a day - Trulicity 1.5 mg weekly - Lantus 36 units daily - Humalog - 15 minutes before a meal 14 units for a smaller meal 16 units for a regular meal 18 units for a larger meal  Please take Humalog before EVERY meal.  Please come back for a  follow-up appointment in 3 months.  - today, HbA1c is 7%  - continue checking sugars at different times of the day - check 3x a day, rotating checks - advised for yearly eye exams >> he is UTD - Return to clinic in 3 mo with sugar log     2. Obesity class 1 -Continue Trulicity which should also help  3. HL - Reviewed latest lipid panel from 12/2016: LDL very high Lab Results  Component Value Date   CHOL 249 (H) 01/04/2017   HDL 36.70 (L) 01/04/2017   LDLCALC NOT CALC 09/03/2015   LDLDIRECT 166.0 01/04/2017   TRIG 246.0 (H) 01/04/2017   CHOLHDL 7 01/04/2017  - Continues Crestor without side effects. - needs a new Lipid panel  Wyatt Kingdom, MD PhD [facility] Endocrinology

## 2018-06-05 ENCOUNTER — Ambulatory Visit (INDEPENDENT_AMBULATORY_CARE_PROVIDER_SITE_OTHER): Payer: Self-pay | Admitting: Internal Medicine

## 2018-06-05 ENCOUNTER — Encounter: Payer: Self-pay | Admitting: Internal Medicine

## 2018-06-05 VITALS — BP 138/80 | HR 93 | Ht 73.27 in | Wt 248.0 lb

## 2018-06-05 DIAGNOSIS — E785 Hyperlipidemia, unspecified: Secondary | ICD-10-CM

## 2018-06-05 DIAGNOSIS — E1142 Type 2 diabetes mellitus with diabetic polyneuropathy: Secondary | ICD-10-CM

## 2018-06-05 DIAGNOSIS — G63 Polyneuropathy in diseases classified elsewhere: Secondary | ICD-10-CM

## 2018-06-05 DIAGNOSIS — E1165 Type 2 diabetes mellitus with hyperglycemia: Secondary | ICD-10-CM

## 2018-06-05 DIAGNOSIS — IMO0002 Reserved for concepts with insufficient information to code with codable children: Secondary | ICD-10-CM

## 2018-06-05 LAB — POCT GLYCOSYLATED HEMOGLOBIN (HGB A1C): Hemoglobin A1C: 10.5 % — AB (ref 4.0–5.6)

## 2018-06-05 MED ORDER — DULAGLUTIDE 1.5 MG/0.5ML ~~LOC~~ SOAJ: 1.5000 mg | SUBCUTANEOUS | 3 refills | Status: DC

## 2018-06-05 MED ORDER — INSULIN LISPRO 100 UNIT/ML (KWIKPEN): PEN_INJECTOR | SUBCUTANEOUS | 11 refills | Status: DC

## 2018-06-05 NOTE — Progress Notes (Signed)
Patient ID: Wyatt Meyer, male   DOB: 07-Apr-1961, 57 y.o.   MRN: [id number]   HPI: Wyatt Meyer is a 57 y.o.-year-old male, returning for f/u for DM2, dx in  2012 insulin-dependent in 2015, uncontrolled, with complications (mild CKD, PN). Last OV 4 months ago.  He had a Freestyle Libre CGM >> ran out of supplies 2 weeks ago.  He does not have the receiver with him for scanning.  He just started a new job. He ran out of Trulicity 2 weeks ago. He is seldom taking metformin now.  Last hemoglobin A1c was: Lab Results  Component Value Date   HGBA1C 10.3 01/17/2018   HGBA1C 10.6 07/23/2017   HGBA1C 11.3 (H) 12/26/2016   HGBA1C >14.0 04/04/2016   HGBA1C 11.7 01/02/2016   HGBA1C 13.4 09/03/2015   HGBA1C 13.4 (A) 09/03/2015   HGBA1C 12.1 (H) 03/23/2015   Pt was on a regimen of: - Metformin 1000 mg 2x a day, with meals >> tolerates it well - Lantus 28 units at bedtime  - Humalog 16 units 3x a day, before meals He tried JanuMet.  "If I take the regimen as prescribed, my sugars are 110-120".  Now on:  - Lantus 36 >> 28 units daily - Humalog - 15 minutes before a meal >> only taking this if sugars are high 14 units for a smaller meal 16 units for a regular meal 18 units for a larger meal  Pt checks his sugars 1-2 times a day: - am: 300-349, then 187-251 >> 180-250 >> 180-240 - 2h after b'fast: n/c - before lunch: 413, 431 >> 131 >> n/c - 2h after lunch: 380-443 >> 287-466 >> n/c - before dinner: n/c - 2h after dinner: n/c >> 193-428 >> up to 400s >> n/c - bedtime: n/c - nighttime: n/c Lowest sugar was 60 >> 60 ; it is unclear at which level he has hypoglycemia awareness Highest sugar was 400s >> 300s  Glucometer: One Touch Ultra 2  Pt's meals are: - Breakfast: oatmeal or sausage bisquit - Lunch: chicken + fries, salads - Dinner: meat + veggies + starch - Snacks: no sodas; tea; crystal lite; chips, candy  He is still walking 2x a day.  -+ mild CKDCKD, last  BUN/creatinine:  Lab Results  Component Value Date   BUN 11 07/23/2017   BUN 11 01/04/2017   CREATININE 1.06 07/23/2017   CREATININE 1.05 01/04/2017  On Lisinopril -  + HL; last set of lipids: Lab Results  Component Value Date   CHOL 249 (H) 01/04/2017   HDL 36.70 (L) 01/04/2017   LDLCALC NOT CALC 09/03/2015   LDLDIRECT 166.0 01/04/2017   TRIG 246.0 (H) 01/04/2017   CHOLHDL 7 01/04/2017  On Crestor. - last eye exam was in 10/2017: No DR - + numbness and tingling in his feet.  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  ROS: Constitutional: no weight gain/no weight loss, + fatigue, no subjective hyperthermia, no subjective hypothermia Eyes: no blurry vision, no xerophthalmia ENT: no sore throat, no nodules palpated in throat, no dysphagia, no odynophagia, no hoarseness Cardiovascular: no CP/+ SOB/no palpitations/ + leg swelling Respiratory: no cough/+ SOB/no wheezing Gastrointestinal: no N/no V/no D/no C/no acid reflux Musculoskeletal: no muscle aches/+ joint aches Skin: no rashes, no hair loss Neurological: no tremors/+ numbness/+ tingling/no dizziness  I reviewed pt's medications, allergies, PMH, social hx, family hx, and changes were documented in the history of present illness. Otherwise, unchanged from my initial visit note.  Past Medical History:  Diagnosis Date  . Erectile dysfunction following radical prostatectomy 10/23/2016  . Klebsiella sepsis ([place]) 03/25/2015  . Osteoarthritis 08/11/2012  . Peripheral neuropathy 08/11/2012  . Prostate cancer ([place])   . Smoker   . SUI (stress urinary incontinence), male 02/03/2013  . Uncontrolled diabetes mellitus with complications ([place]) 01/27/3975   Past Surgical History:  Procedure Laterality Date  . PROSTATE SURGERY  2013   Social History   Social History  . Marital status: Married    Spouse name: N/A  . 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 Smoking status:  Current Every Day Smoker    Packs/day: 0.25    Types: Cigarettes  . Smokeless tobacco: Never Used  . Alcohol use 0.0 oz/week     Comment: 5-6 beers per year  . Drug use: No   Current Outpatient Medications on File Prior to Visit  Medication Sig Dispense Refill  . atorvastatin (LIPITOR) 10 MG tablet Take 1 tablet (10 mg total) by mouth daily. 90 tablet 3  . Continuous Blood Gluc Sensor MISC 1 each by Does not apply route as directed. Use as directed every 10 days. May dispense FreeStyle [employer] or similar. 1 each 0  . cyanocobalamin (,VITAMIN B-12,) 1000 MCG/ML injection 1000 mcg(1 mg) injection once a day for three days once a week for three weeks followed by 1000 mcg injection once a month for three months. 1 mL 15  . Dulaglutide (TRULICITY) 1.5 BH/4.1PF SOPN Inject 1.5 mg once a week into the skin. 12 pen 3  . glucose blood (ONE TOUCH ULTRA TEST) test strip Use 3x a day 200 each 11  . HUMALOG KWIKPEN 100 UNIT/ML KiwkPen INJECT 15 UNITS INTO THE SKIN THREE TIMES A DAY 12 mL 0  . Insulin Glargine (LANTUS SOLOSTAR) 100 UNIT/ML Solostar Pen Inject 36 Units into the skin daily at 10 pm. 15 pen 3  . Insulin Pen Needle (BD PEN NEEDLE NANO U/F) 32G X 4 MM MISC use four times a day 400 each 3  . lisinopril (PRINIVIL,ZESTRIL) 5 MG tablet Take 1 tablet (5 mg total) by mouth daily. 90 tablet 3  . metFORMIN (GLUCOPHAGE) 1000 MG tablet take 1 tablet by mouth twice a day WITH A MEAL 180 tablet 3  . ONE TOUCH LANCETS MISC Use 3x a day 200 each 11  . sildenafil (VIAGRA) 100 MG tablet Dispense Sildenafil 110mg ; Take 30-60 minutes prior to sexual activity daily prn     No current facility-administered medications on file prior to visit.    Allergies  Allergen Reactions  . Codeine Nausea And Vomiting   Family History  Problem Relation Age of Onset  . Hyperlipidemia Mother   . Heart disease Father   . Hyperlipidemia Sister    PE: BP 138/80   Pulse 93   Ht 6' 1.27" (1.861 m)   Wt 248  lb (112.5 kg)   SpO2 97%   BMI 32.48 kg/m  Wt Readings from Last 3 Encounters:  06/05/18 248 lb (112.5 kg)  01/17/18 245 lb 9.6 oz (111.4 kg)  09/25/17 251 lb 9.6 oz (114.1 kg)   Constitutional: overweight, in NAD Eyes: PERRLA, EOMI, no exophthalmos ENT: moist mucous membranes, no thyromegaly, no cervical lymphadenopathy Cardiovascular: Tachycardia RR, No MRG Respiratory: CTA B Gastrointestinal: abdomen soft, NT, ND, BS+ Musculoskeletal: no deformities, strength intact in all 4 Skin: moist, warm, no rashes Neurological: no tremor  with outstretched hands, DTR normal in all 4  ASSESSMENT: 1. DM2, insulin-dependent, uncontrolled, with complications - PN - mild CKD  2. Obesity class 1 BMI Classification:  < 18.5 underweight   18.5-24.9 normal weight   25.0-29.9 overweight   30.0-34.9 class I obesity   35.0-39.9 class II obesity   ? 40.0 class III obesity   3. HL  PLAN:  1. Patient with long-standing, uncontrolled, type 2 diabetes, on metformin, basal-bolus insulin regimen, and also GLP-1 receptor agonist.  At last visit, he was missing many insulin doses, including Lantus, so we discussed about improving compliance.  I advised him to move Lantus before dinner to make sure he does not forget it.  However, he mentions that when he was taking the entire dose of Humalog as recommended, his sugars were lower.  Therefore, advised him to decrease the dose of Humalog. -I suspect that he also has decreased insulin production >> we need to check him for type 1 diabetes after sugars improve (for a C-peptide check, glucose needs to be lower than 200).  However, this would just clarify the diagnosis, we would not necessarily treat him differently if he were insulin deficient. -He had a freestyle libre CGM, but could not refill the sensor prescription as he was between and could not afford it.  He is determined to get another sensor now. -Because of his financial situation, he also had to  stop Trulicity 2 weeks ago.  He is, however, able to purchase it now. -He tells me that his sugars improved after last visit but he did not feel very well with blood sugars lower than 200 so he stopped metformin and also stopped taking Humalog with meals.  He is now taking the Humalog only as needed.  He also decrease the dose of Lantus -We had a long discussion about the fact that he needs to take the Humalog before every meal, and if the sugars are lower than 70, he may need a lower dose, but not to stop it completely.  We also discussed that he needs to be on metformin.  He will also benefit from Trulicity, if he can afford it.  Also, since his sugars are high, we need to increase the Lantus back to the recommended dose. - I suggested to:  Patient Instructions  Please restart: - Metformin 500 mg 2x a day for 3 days, then increase to 1000 mg 2x a day with meals - Trulicity 1.5 mg weekly  Please increase: - Lantus to 36 units daily  Please use: - Humalog - 15 minutes before a meal 14 units for a smaller meal 16 units for a regular meal 18 units for a larger meal  Please take Humalog before EVERY meal.  Please come back for a follow-up appointment in 3 months.  - today, HbA1c is 10.5% (higher) - continue checking sugars at different times of the day - check 3x a day, rotating checks, or check with a freestyle libre CGM and bring the receiver - advised for yearly eye exams >> he is UTD - Return to clinic in 3 mo with sugar log   2. Obesity class 1 -His weight is not significantly changed since last visit -Restart Trulicity which should also help  3. HL - Reviewed latest lipid panel from04/2018: LDL very high, TG high, HDL low Lab Results  Component Value Date   CHOL 249 (H) 01/04/2017   HDL 36.70 (L) 01/04/2017   LDLCALC NOT CALC 09/03/2015   LDLDIRECT 166.0  01/04/2017   TRIG 246.0 (H) 01/04/2017   CHOLHDL 7 01/04/2017  - continues Crestor w/o SEs - needs a new Lipid panel >>  would like to postpone this for now  Philemon Kingdom, MD PhD [facility] Endocrinology

## 2018-06-05 NOTE — Addendum Note (Signed)
Addended by: Cardell Peach I on: 06/05/2018 04:16 PM   Modules accepted: Orders

## 2018-06-05 NOTE — Patient Instructions (Addendum)
Please restart: - Metformin 500 mg 2x a day for 3 days, then increase to 1000 mg 2x a day with meals - Trulicity 1.5 mg weekly  Please increase: - Lantus to 36 units daily  Please use: - Humalog - 15 minutes before a meal 14 units for a smaller meal 16 units for a regular meal 18 units for a larger meal  Please take Humalog before EVERY meal.  Please come back for a follow-up appointment in 3 months.

## 2018-06-12 ENCOUNTER — Other Ambulatory Visit: Payer: Self-pay | Admitting: Internal Medicine

## 2018-06-12 DIAGNOSIS — E1142 Type 2 diabetes mellitus with diabetic polyneuropathy: Secondary | ICD-10-CM

## 2018-06-12 DIAGNOSIS — E1165 Type 2 diabetes mellitus with hyperglycemia: Principal | ICD-10-CM

## 2018-06-12 DIAGNOSIS — IMO0002 Reserved for concepts with insufficient information to code with codable children: Secondary | ICD-10-CM

## 2018-08-26 ENCOUNTER — Other Ambulatory Visit: Payer: Self-pay | Admitting: Internal Medicine

## 2018-08-26 DIAGNOSIS — E1165 Type 2 diabetes mellitus with hyperglycemia: Principal | ICD-10-CM

## 2018-08-26 DIAGNOSIS — IMO0002 Reserved for concepts with insufficient information to code with codable children: Secondary | ICD-10-CM

## 2018-08-26 DIAGNOSIS — E1142 Type 2 diabetes mellitus with diabetic polyneuropathy: Secondary | ICD-10-CM

## 2018-08-26 MED ORDER — FREESTYLE LIBRE 14 DAY SENSOR MISC: 1.0000 | 4 refills | Status: DC

## 2018-09-01 ENCOUNTER — Encounter: Payer: Self-pay | Admitting: Internal Medicine

## 2018-09-01 MED ORDER — INSULIN LISPRO (1 UNIT DIAL) 100 UNIT/ML (KWIKPEN): PEN_INJECTOR | SUBCUTANEOUS | 4 refills | Status: DC

## 2018-09-01 MED ORDER — INSULIN GLARGINE 100 UNIT/ML SOLOSTAR PEN: 36.0000 [IU] | PEN_INJECTOR | Freq: Every day | SUBCUTANEOUS | 3 refills | Status: DC

## 2018-09-01 MED ORDER — METFORMIN HCL 1000 MG PO TABS: ORAL_TABLET | ORAL | 3 refills | Status: DC

## 2018-09-08 ENCOUNTER — Ambulatory Visit: Payer: Self-pay | Admitting: Internal Medicine

## 2018-09-08 ENCOUNTER — Encounter: Payer: Self-pay | Admitting: Internal Medicine

## 2018-09-08 VITALS — BP 120/70 | HR 109 | Ht 73.27 in | Wt 247.0 lb

## 2018-09-08 DIAGNOSIS — E785 Hyperlipidemia, unspecified: Secondary | ICD-10-CM

## 2018-09-08 DIAGNOSIS — Z6831 Body mass index (BMI) 31.0-31.9, adult: Secondary | ICD-10-CM

## 2018-09-08 DIAGNOSIS — E669 Obesity, unspecified: Secondary | ICD-10-CM

## 2018-09-08 DIAGNOSIS — E1165 Type 2 diabetes mellitus with hyperglycemia: Secondary | ICD-10-CM

## 2018-09-08 DIAGNOSIS — IMO0002 Reserved for concepts with insufficient information to code with codable children: Secondary | ICD-10-CM

## 2018-09-08 DIAGNOSIS — E1142 Type 2 diabetes mellitus with diabetic polyneuropathy: Secondary | ICD-10-CM

## 2018-09-08 LAB — POCT GLYCOSYLATED HEMOGLOBIN (HGB A1C): Hemoglobin A1C: 11.7 % — AB (ref 4.0–5.6)

## 2018-09-08 MED ORDER — INSULIN LISPRO (1 UNIT DIAL) 100 UNIT/ML (KWIKPEN): PEN_INJECTOR | SUBCUTANEOUS | 5 refills | Status: DC

## 2018-09-08 NOTE — Progress Notes (Signed)
Patient ID: Wyatt Meyer, male   DOB: 09-Oct-1960, 57 y.o.   MRN: [id number]   HPI: Wyatt Meyer is a 57 y.o.-year-old male, returning for f/u for DM2, dx in  2012 insulin-dependent in 2015, uncontrolled, with complications (mild CKD, PN). Last OV 4 months ago.  Since last visit >> he could not afford his insulin (had to change the insurance plan) >> sugars higher.  He ran out of both insulins and 08/24/2018 and restarted the Humalog only in 09/02/2018.  He mentions that he was off Trulicity since last OV. He recently purchased supplies for his sensor (CGM).  He briefly increase his metformin dose to 2000 mg twice a day while he was off Humalog.  Last hemoglobin A1c was: Lab Results  Component Value Date   HGBA1C 10.5 (A) 06/05/2018   HGBA1C 10.3 01/17/2018   HGBA1C 10.6 07/23/2017   HGBA1C 11.3 (H) 12/26/2016   HGBA1C >14.0 04/04/2016   HGBA1C 11.7 01/02/2016   HGBA1C 13.4 09/03/2015   HGBA1C 13.4 (A) 09/03/2015   HGBA1C 12.1 (H) 03/23/2015   Pt was on a regimen of: - Metformin 1000 mg 2x a day, with meals >> tolerates it well - Lantus 28 units at bedtime  - Humalog 16 units 3x a day, before meals He tried JanuMet.  "If I take the regimen as prescribed, my sugars are 110-120".  Now on: - Metformin 1000 mg 2x a day-restarted at last visit - Trulicity 1.5 mg weekly-restarted at last visit >> ran out since last OV - Lantus 36 >> 28 >> 36 units daily >> ran out 14 days ago - Humalog - 15 minutes before a meal >> at last visit, he was only taking this if sugars were high 14 units for a smaller meal >> now 20 per meal as he is off Lantus 16 units for a regular meal 18 units for a larger meal  He has a freestyle libre CGM: Freestyle Libre CGM parameters: - average: 341+/-8.6 - coefficient of variation: 23.6 (19-25%) - time in range:  - low (<70): 0% - normal (70-180): 4% - high (>180): 96%  Lowest sugar was 60 >> 60 >> 155; it is unclear at which level he has hypoglycemia  awareness. Highest sugar was 400s >> 300s >> HI  Glucometer: One Touch Ultra 2  Pt's meals are: - Breakfast: oatmeal or sausage bisquit - Lunch: chicken + fries, salads - Dinner: meat + veggies + starch - Snacks: no sodas; tea; crystal lite; chips, candy  -+ Mild CKD, last BUN/creatinine:  Lab Results  Component Value Date   BUN 11 07/23/2017   BUN 11 01/04/2017   CREATININE 1.06 07/23/2017   CREATININE 1.05 01/04/2017  On lisinopril. -+ HL; last set of lipids: Lab Results  Component Value Date   CHOL 249 (H) 01/04/2017   HDL 36.70 (L) 01/04/2017   LDLCALC NOT CALC 09/03/2015   LDLDIRECT 166.0 01/04/2017   TRIG 246.0 (H) 01/04/2017   CHOLHDL 7 01/04/2017  On Crestor. - last eye exam was in 10/2017: No DR - + numbness and tingling in his feet.  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  ROS: Constitutional: no weight gain/no weight loss, + fatigue, no subjective hyperthermia, no subjective hypothermia Eyes: no blurry vision, no xerophthalmia ENT: no sore throat, no nodules palpated in neck, no dysphagia, no odynophagia, no hoarseness Cardiovascular: no CP/no SOB/no palpitations/no leg swelling Respiratory: no cough/no SOB/no wheezing Gastrointestinal: no N/no V/no D/no C/no acid reflux Musculoskeletal: no  muscle aches/no joint aches Skin: no rashes, no hair loss Neurological: no tremors/+ numbness/+ tingling/no dizziness  I reviewed pt's medications, allergies, PMH, social hx, family hx, and changes were documented in the history of present illness. Otherwise, unchanged from my initial visit note.   Past Medical History:  Diagnosis Date  . Erectile dysfunction following radical prostatectomy 10/23/2016  . Klebsiella sepsis ([place]) 03/25/2015  . Osteoarthritis 08/11/2012  . Peripheral neuropathy 08/11/2012  . Prostate cancer ([place])   . Smoker   . SUI (stress urinary incontinence), male 02/03/2013  . Uncontrolled diabetes mellitus with complications ([place]) 1/93/7902    Past Surgical History:  Procedure Laterality Date  . PROSTATE SURGERY  2013   Social History   Social History  . Marital status: Married    Spouse name: N/A  . 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 Smoking status: Current Every Day Smoker    Packs/day: 0.25    Types: Cigarettes  . Smokeless tobacco: Never Used  . Alcohol use 0.0 oz/week     Comment: 5-6 beers per year  . Drug use: No   Current Outpatient Medications on File Prior to Visit  Medication Sig Dispense Refill  . atorvastatin (LIPITOR) 10 MG tablet Take 1 tablet (10 mg total) by mouth daily. 90 tablet 3  . Continuous Blood Gluc Sensor (FREESTYLE LIBRE 14 DAY SENSOR) MISC Place 1 Device onto the skin every 14 (fourteen) days. 2 each 4  . cyanocobalamin (,VITAMIN B-12,) 1000 MCG/ML injection 1000 mcg(1 mg) injection once a day for three days once a week for three weeks followed by 1000 mcg injection once a month for three months. 1 mL 15  . Dulaglutide (TRULICITY) 1.5 IO/9.7DZ SOPN Inject 1.5 mg into the skin once a week. 12 pen 3  . glucose blood (ONE TOUCH ULTRA TEST) test strip Use 3x a day 200 each 11  . Insulin Glargine (LANTUS SOLOSTAR) 100 UNIT/ML Solostar Pen Inject 36 Units into the skin daily at 10 pm. 15 pen 3  . insulin lispro (HUMALOG KWIKPEN) 100 UNIT/ML KwikPen Use with each meal 14 units for small meal 16 units for regular meal 18 units for large meal 15 mL 4  . Insulin Pen Needle (BD PEN NEEDLE NANO U/F) 32G X 4 MM MISC use four times a day 400 each 3  . lisinopril (PRINIVIL,ZESTRIL) 5 MG tablet Take 1 tablet (5 mg total) by mouth daily. 90 tablet 3  . metFORMIN (GLUCOPHAGE) 1000 MG tablet take 1 tablet by mouth twice a day WITH A MEAL 180 tablet 3  . ONE TOUCH LANCETS MISC Use 3x a day 200 each 11  . sildenafil (VIAGRA) 100 MG tablet Dispense Sildenafil 110mg ; Take 30-60 minutes prior to sexual activity daily prn     No current  facility-administered medications on file prior to visit.    Allergies  Allergen Reactions  . Codeine Nausea And Vomiting   Family History  Problem Relation Age of Onset  . Hyperlipidemia Mother   . Heart disease Father   . Hyperlipidemia Sister    PE: BP 120/70   Pulse (!) 109   Ht 6' 1.27" (1.861 m)   Wt 247 lb (112 kg)   SpO2 98%   BMI 32.35 kg/m  Wt Readings from Last 3 Encounters:  09/08/18 247 lb (112 kg)  06/05/18 248 lb (112.5 kg)  01/17/18 245 lb 9.6 oz (111.4 kg)  Constitutional: overweight, in NAD Eyes: PERRLA, EOMI, no exophthalmos ENT: moist mucous membranes, no thyromegaly, no cervical lymphadenopathy Cardiovascular: Tachycardia, RR, No MRG Respiratory: CTA B Gastrointestinal: abdomen soft, NT, ND, BS+ Musculoskeletal: no deformities, strength intact in all 4 Skin: moist, warm, no rashes Neurological: no tremor with outstretched hands, DTR normal in all 4  ASSESSMENT: 1. DM2, insulin-dependent, uncontrolled, with complications - PN - mild CKD  2. Obesity class 1 BMI Classification:  < 18.5 underweight   18.5-24.9 normal weight   25.0-29.9 overweight   30.0-34.9 class I obesity   35.0-39.9 class II obesity   ? 40.0 class III obesity   3. HL  PLAN:  1. Patient with longstanding, uncontrolled, type 2 diabetes, on metformin, basal-bolus insulin regimen, and also GLP-1 receptor agonist.  We discussed at length in the past about the importance of being compliant with his insulin doses, of which he was missing many.  I advised him to move Lantus before dinner to make sure he does not forget it.  His sugars improved afterwards so we had to decrease the doses of Humalog at last visit.  At last visit, he was unfortunately off metformin and Humalog.  HbA1c was higher, at 10.5%.  We restarted both. -However, he tells me now that he did not restart Trulicity and he ran out of both of his insulin at the beginning of the month.  He was only able to restart  Humalog few days ago.  In the interim, he tried to increase metformin to 2000 mg twice a day.  I advised him not to use this higher dose. -We discussed that we absolutely need to keep him on his medications without him running out to be able to improve his diabetes.  I offered to change his insulins to more affordable ones, but he mentions that he would be able to afford them after the beginning of the year.  We also discussed about Trulicity, which she agrees to restart.  I gave him samples of Trulicity and Lantus at this visit. -We reviewed together his previous HbA1c levels and they were very high throughout the years.  I advised him that we absolutely need to bring them down, and for this he needs to comply with the medicines.  I advised him that if he has a problem with medicines in the future to let me know right away, to see if maybe I can help. -We will need to check him for type 1 diabetes, however, for now, we cannot stop his insulin so we will wait until his sugars are more under control to do this. - I suggested to:  Patient Instructions  Please continue: - Metformin 1000 mg 2x a day with meals  Please change: - Humalog - 15 minutes before a meal 18 units for a smaller meal 20 units for a regular meal 22 units for a larger meal  Restart: - Trulicity 1.5 mg weekly - Lantus 36 units daily  Please come back for a follow-up appointment in 3 months.  - today, HbA1c is 11.7% (higher) - continue checking sugars at different times of the day - check 3x a day, rotating checks - advised for yearly eye exams >> he is UTD - Return to clinic in 3 mo with sugar log   2. Obesity class 1 -Continue Trulicity which should also help with weight loss -No weight change since last visit  3. HL - Reviewed latest lipid panel from 12/2016: LDL very high, triglycerides high, HDL low Lab  Results  Component Value Date   CHOL 249 (H) 01/04/2017   HDL 36.70 (L) 01/04/2017   LDLCALC NOT CALC  09/03/2015   LDLDIRECT 166.0 01/04/2017   TRIG 246.0 (H) 01/04/2017   CHOLHDL 7 01/04/2017  - Continues Crestor without side effects. -He needs another lipid panel but he would like to wait until next visit to have his diabetes more under control until then.  Philemon Kingdom, MD PhD [facility]

## 2018-09-08 NOTE — Patient Instructions (Addendum)
Please continue: - Metformin 1000 mg 2x a day with meals  Please change: - Humalog - 15 minutes before a meal 18 units for a smaller meal 20 units for a regular meal 22 units for a larger meal  Restart: - Trulicity 1.5 mg weekly - Lantus 36 units daily  Please come back for a follow-up appointment in 3 months.

## 2018-09-08 NOTE — Addendum Note (Signed)
Addended by: Cardell Peach I on: 09/08/2018 04:18 PM   Modules accepted: Orders

## 2019-02-04 ENCOUNTER — Other Ambulatory Visit: Payer: Self-pay | Admitting: Internal Medicine

## 2019-02-04 DIAGNOSIS — IMO0002 Reserved for concepts with insufficient information to code with codable children: Secondary | ICD-10-CM

## 2019-02-04 DIAGNOSIS — E1142 Type 2 diabetes mellitus with diabetic polyneuropathy: Secondary | ICD-10-CM

## 2019-04-22 ENCOUNTER — Other Ambulatory Visit: Payer: Self-pay

## 2019-04-22 DIAGNOSIS — Z20822 Contact with and (suspected) exposure to covid-19: Secondary | ICD-10-CM

## 2019-04-23 LAB — NOVEL CORONAVIRUS, NAA: SARS-CoV-2, NAA: NOT DETECTED

## 2019-06-30 ENCOUNTER — Other Ambulatory Visit: Payer: Self-pay

## 2019-06-30 DIAGNOSIS — IMO0002 Reserved for concepts with insufficient information to code with codable children: Secondary | ICD-10-CM

## 2019-06-30 DIAGNOSIS — E1142 Type 2 diabetes mellitus with diabetic polyneuropathy: Secondary | ICD-10-CM

## 2019-06-30 MED ORDER — FREESTYLE LIBRE 14 DAY SENSOR MISC: 1.0000 | 4 refills | Status: DC

## 2019-07-23 ENCOUNTER — Other Ambulatory Visit: Payer: Self-pay

## 2019-07-23 MED ORDER — FREESTYLE LIBRE 14 DAY READER DEVI: 1.0000 | Freq: Every day | 0 refills | Status: DC

## 2019-09-28 ENCOUNTER — Other Ambulatory Visit: Payer: Self-pay

## 2019-09-28 MED ORDER — INSULIN LISPRO (1 UNIT DIAL) 100 UNIT/ML (KWIKPEN): PEN_INJECTOR | SUBCUTANEOUS | 0 refills | Status: DC

## 2019-09-28 MED ORDER — LANTUS SOLOSTAR 100 UNIT/ML ~~LOC~~ SOPN: 36.0000 [IU] | PEN_INJECTOR | Freq: Every day | SUBCUTANEOUS | 0 refills | Status: DC

## 2019-11-04 ENCOUNTER — Other Ambulatory Visit: Payer: Self-pay

## 2019-11-04 ENCOUNTER — Encounter: Payer: Self-pay | Admitting: Internal Medicine

## 2019-11-04 ENCOUNTER — Ambulatory Visit (INDEPENDENT_AMBULATORY_CARE_PROVIDER_SITE_OTHER): Payer: BC Managed Care – PPO | Admitting: Internal Medicine

## 2019-11-04 VITALS — BP 142/80 | HR 90 | Ht 73.0 in | Wt 252.0 lb

## 2019-11-04 DIAGNOSIS — IMO0002 Reserved for concepts with insufficient information to code with codable children: Secondary | ICD-10-CM

## 2019-11-04 DIAGNOSIS — E669 Obesity, unspecified: Secondary | ICD-10-CM | POA: Diagnosis not present

## 2019-11-04 DIAGNOSIS — E1165 Type 2 diabetes mellitus with hyperglycemia: Secondary | ICD-10-CM | POA: Diagnosis not present

## 2019-11-04 DIAGNOSIS — E1142 Type 2 diabetes mellitus with diabetic polyneuropathy: Secondary | ICD-10-CM

## 2019-11-04 DIAGNOSIS — Z6831 Body mass index (BMI) 31.0-31.9, adult: Secondary | ICD-10-CM

## 2019-11-04 DIAGNOSIS — E785 Hyperlipidemia, unspecified: Secondary | ICD-10-CM

## 2019-11-04 LAB — POCT GLYCOSYLATED HEMOGLOBIN (HGB A1C): Hemoglobin A1C: 12 % — AB (ref 4.0–5.6)

## 2019-11-04 MED ORDER — LANTUS SOLOSTAR 100 UNIT/ML ~~LOC~~ SOPN: 40.0000 [IU] | PEN_INJECTOR | Freq: Every day | SUBCUTANEOUS | 3 refills | Status: DC

## 2019-11-04 MED ORDER — ATORVASTATIN CALCIUM 10 MG PO TABS: 10.0000 mg | ORAL_TABLET | Freq: Every day | ORAL | 3 refills | Status: DC

## 2019-11-04 MED ORDER — TRULICITY 1.5 MG/0.5ML ~~LOC~~ SOAJ: 1.5000 mg | SUBCUTANEOUS | 3 refills | Status: DC

## 2019-11-04 MED ORDER — LISINOPRIL 5 MG PO TABS: 5.0000 mg | ORAL_TABLET | Freq: Every day | ORAL | 3 refills | Status: DC

## 2019-11-04 MED ORDER — METFORMIN HCL ER 500 MG PO TB24: 1000.0000 mg | ORAL_TABLET | Freq: Two times a day (BID) | ORAL | 3 refills | Status: DC

## 2019-11-04 NOTE — Addendum Note (Signed)
Addended by: Cardell Peach I on: 11/04/2019 10:44 AM   Modules accepted: Orders

## 2019-11-04 NOTE — Progress Notes (Signed)
Patient ID: Wyatt Meyer, male   DOB: 02-09-1961, 59 y.o.   MRN: [id number]   This visit occurred during the SARS-CoV-2 public health emergency.  Safety protocols were in place, including screening questions prior to the visit, additional usage of staff PPE, and extensive cleaning of exam room while observing appropriate contact time as indicated for disinfecting solutions.   HPI: Wyatt Meyer is a 59 y.o.-year-old male, returning for f/u for DM2, dx in  2012 insulin-dependent in 2015, uncontrolled, with complications (mild CKD, PN). Last OV 1 year and 2 months ago.  He had problems repeatedly in the past affording his medications.  Unfortunately, he does not usually let me know about this and just comes off medications and misses appointments.  I discussed with him many times in the past about letting me help him through these difficult periods.  I offered to change his regimen or give him samples.  However, he did not do so.  At this visit, we discussed that due to his disengagement with his diabetes care, this will be our last appointment.  Reviewed HbA1c levels: Lab Results  Component Value Date   HGBA1C 11.7 (A) 09/08/2018   HGBA1C 10.5 (A) 06/05/2018   HGBA1C 10.3 01/17/2018   HGBA1C 10.6 07/23/2017   HGBA1C 11.3 (H) 12/26/2016   HGBA1C >14.0 04/04/2016   HGBA1C 11.7 01/02/2016   HGBA1C 13.4 09/03/2015   HGBA1C 13.4 (A) 09/03/2015   HGBA1C 12.1 (H) 03/23/2015   Pt was on a regimen of: - Metformin 1000 mg 2x a day, with meals >> tolerates it well - Lantus 28 units at bedtime  - Humalog 16 units 3x a day, before meals He tried JanuMet.  "If I take the regimen as prescribed, my sugars are 110-120".  At last visit he was on: - Metformin 1000 mg 2x a day-restarted at last visit - Trulicity 1.5 mg weekly-restarted at last visit >> ran out since last OV - Lantus 36 >> 28 >> 36 units daily >> ran out 14 days ago - Humalog - 15 minutes before a meal >> at last visit, he was only  taking this if sugars were high 14 units for a smaller meal >> now 20 per meal as he is off Lantus 16 units for a regular meal 18 units for a larger meal  We changed to: - Metformin 1000 mg 2x a day with meals >> rarely b/c diarrhea -  - Humalog - 15 minutes before a meal  >> 16 units 2x a day 18 units for a smaller meal 20 units for a regular meal 22 units for a larger meal - Lantus 36 >> actually 32 units daily in HS  He has a freestyle libre CGM, with which he checks his sugars more than 4 times a day: Freestyle Libre CGM parameters: - average: 341+/-8.6 >> 292 - coefficient of variation: 23.6  >> 20.2% (19-25%) - time in range:  - low (<70): 0% >> 0% - normal (70-180): 4% >> 4% - high (181-250): 96% >> 19% - very high (>250): 77%   Lowest sugar was 60 >> 60 >> 155 >> 60s; it is unclear at which ever she has hypoglycemia awareness Highest sugar was 400s >> 300s >> HI >> HI.  Glucometer: One Touch Ultra 2  Pt's meals are: - Breakfast: oatmeal or sausage bisquit - Lunch: chicken + fries, salads - Dinner: meat + veggies + starch - Snacks: no sodas; tea; crystal lite; chips, candy  -+ Mild CKD,  last BUN/creatinine:  Lab Results  Component Value Date   BUN 11 07/23/2017   BUN 11 01/04/2017   CREATININE 1.06 07/23/2017   CREATININE 1.05 01/04/2017  He ran out of lisinopril. -+ HL; last set of lipids: Lab Results  Component Value Date   CHOL 249 (H) 01/04/2017   HDL 36.70 (L) 01/04/2017   LDLCALC NOT CALC 09/03/2015   LDLDIRECT 166.0 01/04/2017   TRIG 246.0 (H) 01/04/2017   CHOLHDL 7 01/04/2017  He ran out of Lipitor. - last eye exam was in 10/2017: No DR - he has numbness and tingling in his feet.  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  ROS: Constitutional: no weight gain/no weight loss, no fatigue, no subjective hyperthermia, no subjective hypothermia Eyes: no blurry vision, no xerophthalmia ENT: no sore throat, no nodules palpated in neck, no dysphagia,  no odynophagia, no hoarseness Cardiovascular: no CP/no SOB/no palpitations/no leg swelling Respiratory: no cough/no SOB/no wheezing Gastrointestinal: no N/no V/no D/no C/no acid reflux Musculoskeletal: no muscle aches/no joint aches Skin: no rashes, no hair loss Neurological: no tremors/+ numbness/+ tingling/no dizziness  I reviewed pt's medications, allergies, PMH, social hx, family hx, and changes were documented in the history of present illness. Otherwise, unchanged from my initial visit note.   Past Medical History:  Diagnosis Date  . Erectile dysfunction following radical prostatectomy 10/23/2016  . Klebsiella sepsis ([place]) 03/25/2015  . Osteoarthritis 08/11/2012  . Peripheral neuropathy 08/11/2012  . Prostate cancer ([place])   . Smoker   . SUI (stress urinary incontinence), male 02/03/2013  . Uncontrolled diabetes mellitus with complications ([place]) 0000000   Past Surgical History:  Procedure Laterality Date  . PROSTATE SURGERY  2013   Social History   Social History  . Marital status: Married    Spouse name: N/A  . 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 Smoking status: Current Every Day Smoker    Packs/day: 0.25    Types: Cigarettes  . Smokeless tobacco: Never Used  . Alcohol use 0.0 oz/week     Comment: 5-6 beers per year  . Drug use: No   Current Outpatient Medications on File Prior to Visit  Medication Sig Dispense Refill  . atorvastatin (LIPITOR) 10 MG tablet Take 1 tablet (10 mg total) by mouth daily. 90 tablet 3  . Continuous Blood Gluc Receiver (FREESTYLE LIBRE 14 DAY READER) DEVI 1 Device by Does not apply route daily. Use to check blood sugars daily 1 Device 0  . Continuous Blood Gluc Sensor (FREESTYLE LIBRE 14 DAY SENSOR) MISC 1 Device by Other route every 14 (fourteen) days. 2 each 4  . cyanocobalamin (,VITAMIN B-12,) 1000 MCG/ML injection 1000 mcg(1 mg) injection once a day for three days once a  week for three weeks followed by 1000 mcg injection once a month for three months. 1 mL 15  . Dulaglutide (TRULICITY) 1.5 0000000 SOPN Inject 1.5 mg into the skin once a week. (Patient not taking: Reported on 09/08/2018) 12 pen 3  . glucose blood (ONE TOUCH ULTRA TEST) test strip Use 3x a day 200 each 11  . Insulin Glargine (LANTUS SOLOSTAR) 100 UNIT/ML Solostar Pen Inject 36 Units into the skin daily at 10 pm. MUST HAVE APPOINTMENT FOR REFILLS 15 pen 0  . insulin lispro (HUMALOG KWIKPEN) 100 UNIT/ML KwikPen Inject under skin 18-22 units before each meal - 3x a day MUST HAVE APPOINTMENT FOR REFILLS 20  pen 0  . Insulin Pen Needle (BD PEN NEEDLE NANO U/F) 32G X 4 MM MISC use four times a day 400 each 3  . lisinopril (PRINIVIL,ZESTRIL) 5 MG tablet Take 1 tablet (5 mg total) by mouth daily. 90 tablet 3  . metFORMIN (GLUCOPHAGE) 1000 MG tablet take 1 tablet by mouth twice a day WITH A MEAL 180 tablet 3  . ONE TOUCH LANCETS MISC Use 3x a day 200 each 11  . sildenafil (VIAGRA) 100 MG tablet Dispense Sildenafil 110mg ; Take 30-60 minutes prior to sexual activity daily prn     No current facility-administered medications on file prior to visit.   Allergies  Allergen Reactions  . Codeine Nausea And Vomiting   Family History  Problem Relation Age of Onset  . Hyperlipidemia Mother   . Heart disease Father   . Hyperlipidemia Sister    PE: BP (!) 142/80   Pulse 90   Ht 6\' 1"  (1.854 m)   Wt 252 lb (114.3 kg)   SpO2 98%   BMI 33.25 kg/m  Wt Readings from Last 3 Encounters:  11/04/19 252 lb (114.3 kg)  09/08/18 247 lb (112 kg)  06/05/18 248 lb (112.5 kg)   Constitutional: overweight, in NAD Eyes: PERRLA, EOMI, no exophthalmos ENT: moist mucous membranes, no thyromegaly, no cervical lymphadenopathy Cardiovascular: RRR, No MRG Respiratory: CTA B Gastrointestinal: abdomen soft, NT, ND, BS+ Musculoskeletal: no deformities, strength intact in all 4 Skin: moist, warm, no rashes Neurological: no  tremor with outstretched hands, DTR normal in all 4  ASSESSMENT: 1. DM2, insulin-dependent, uncontrolled, with complications - PN - mild CKD  2. Obesity class 1 BMI Classification:  < 18.5 underweight   18.5-24.9 normal weight   25.0-29.9 overweight   30.0-34.9 class I obesity   35.0-39.9 class II obesity   ? 40.0 class III obesity   3. HL  PLAN:  1. Patient with longstanding, type 2 diabetes, on Metformin, basal-bolus insulin regimen and also weekly GLP-1 receptor agonist.   At last visit, HbA1c was very high, at 11.7%.  At that time, we restarted his Lantus and Trulicity and adjusted his Humalog doses, which she stopped due to affordability.  He had periods in the past in which he could not afford his medicines but he refused changing the medications due to improvement of his finances whenever he saw me in the past.  I did advise him to let me know if he cannot afford his medicines to see if we need to change the regimen or maybe even give him samples. -At this visit, he returns after another long absence of 1 year in 2 months.  He ran out of Trulicity, is not taking Metformin due to side effects, and is using lower doses of Lantus and Humalog prescribed.  He did not let me know about the fact that he could not afford Trulicity, he could not tolerate Metformin and that his sugars remain high on the lower doses of insulins.  As of now, he tells me that he has insurance and is able to afford his medicines, so we will start him on Metformin ER, will restart Trulicity, and increase his insulin.  However, from now on, he will need to establish care with another visit, since I do not feel I can further help him with his diabetes care. - I suggested to:  Patient Instructions  Please start: - Metformin ER 500 mg 2x a day with meals and increase to 1000 mg 2x a day - Trulicity  1.5 mg weekly  Please increase: - Humalog - 15 minutes before a meal 18 units for a smaller meal 20 units for a  regular meal 22 units for a larger meal - Lantus 36 units daily (may increase to 40 units if needed in 4 days)   Please establish care with another endocrinologist.  - we checked his HbA1c: 12% (higher than before - advised to check sugars at different times of the day - 3x a day, rotating check times - advised for yearly eye exams >> he is not UTD   2. Obesity class 1 -Restart Trulicity which should also help with weight loss -He gained 5 pounds since last visit  3. HL -Reviewed latest lipid panel from 12/2016: LDL very high, triglycerides high, HDL low Lab Results  Component Value Date   CHOL 249 (H) 01/04/2017   HDL 36.70 (L) 01/04/2017   LDLCALC NOT CALC 09/03/2015   LDLDIRECT 166.0 01/04/2017   TRIG 246.0 (H) 01/04/2017   CHOLHDL 7 01/04/2017  -He ran out of his statin-refill today -He is due for another lipid panel, but will not check today since he was off of them.  He has an appointment with PCP coming up  Philemon Kingdom, MD PhD [facility]

## 2019-11-04 NOTE — Patient Instructions (Addendum)
Please start: - Metformin ER 500 mg 2x a day with meals and increase to 1000 mg 2x a day - Trulicity 1.5 mg weekly  Please increase: - Humalog - 15 minutes before a meal 18 units for a smaller meal 20 units for a regular meal 22 units for a larger meal - Lantus 36 units daily (may increase to 40 units if needed in 4 days)   Please establish care with another endocrinologist.

## 2019-11-12 ENCOUNTER — Ambulatory Visit: Payer: BC Managed Care – PPO

## 2019-11-16 ENCOUNTER — Ambulatory Visit: Payer: BC Managed Care – PPO | Attending: Family

## 2019-11-16 DIAGNOSIS — Z23 Encounter for immunization: Secondary | ICD-10-CM | POA: Insufficient documentation

## 2019-11-16 NOTE — Progress Notes (Signed)
   Covid-19 Vaccination Clinic  Name:  Wyatt Meyer    MRN: [id number] DOB: [birth year]  11/16/2019  [name] was observed post Covid-19 immunization for 15 minutes without incidence. He was provided with Vaccine Information Sheet and instruction to access the V-Safe system.   [name] was instructed to call 911 with any severe reactions post vaccine: [name] Difficulty breathing  . Swelling of your face and throat  . A fast heartbeat  . A bad rash all over your body  . Dizziness and weakness    Immunizations Administered    Name Date Dose VIS Date Route   Moderna COVID-19 Vaccine 11/16/2019  9:25 AM 0.5 mL 08/25/2019 Intramuscular   Manufacturer: Moderna   Lot: [id number]   [id number]

## 2019-12-09 ENCOUNTER — Other Ambulatory Visit: Payer: Self-pay | Admitting: Internal Medicine

## 2019-12-09 DIAGNOSIS — E1142 Type 2 diabetes mellitus with diabetic polyneuropathy: Secondary | ICD-10-CM

## 2019-12-09 DIAGNOSIS — IMO0002 Reserved for concepts with insufficient information to code with codable children: Secondary | ICD-10-CM

## 2019-12-15 ENCOUNTER — Ambulatory Visit: Payer: BC Managed Care – PPO | Attending: Family

## 2019-12-15 DIAGNOSIS — Z23 Encounter for immunization: Secondary | ICD-10-CM

## 2019-12-15 NOTE — Progress Notes (Signed)
   Covid-19 Vaccination Clinic  Name:  DALTIN SERANO    MRN: [id number] DOB: 1960/10/31  12/15/2019  Mr. Thobe was observed post Covid-19 immunization for 15 minutes without incident. He was provided with Vaccine Information Sheet and instruction to access the V-Safe system.   Mr. Harvan was instructed to call 911 with any severe reactions post vaccine: [name] Difficulty breathing  . Swelling of face and throat  . A fast heartbeat  . A bad rash all over body  . Dizziness and weakness   Immunizations Administered    Name Date Dose VIS Date Route   Moderna COVID-19 Vaccine 12/15/2019 12:05 PM 0.5 mL 08/25/2019 Intramuscular   Manufacturer: Moderna   LotMV:[id number]   [id number]

## 2020-01-08 ENCOUNTER — Encounter: Payer: Self-pay | Admitting: Family Medicine

## 2020-01-08 ENCOUNTER — Other Ambulatory Visit: Payer: Self-pay

## 2020-01-08 ENCOUNTER — Ambulatory Visit (INDEPENDENT_AMBULATORY_CARE_PROVIDER_SITE_OTHER): Payer: BC Managed Care – PPO | Admitting: Family Medicine

## 2020-01-08 VITALS — BP 130/76 | HR 96 | Temp 97.0°F | Ht 73.0 in | Wt 253.2 lb

## 2020-01-08 DIAGNOSIS — Z8546 Personal history of malignant neoplasm of prostate: Secondary | ICD-10-CM | POA: Diagnosis not present

## 2020-01-08 DIAGNOSIS — E785 Hyperlipidemia, unspecified: Secondary | ICD-10-CM | POA: Diagnosis not present

## 2020-01-08 DIAGNOSIS — IMO0002 Reserved for concepts with insufficient information to code with codable children: Secondary | ICD-10-CM

## 2020-01-08 DIAGNOSIS — N5231 Erectile dysfunction following radical prostatectomy: Secondary | ICD-10-CM | POA: Diagnosis not present

## 2020-01-08 DIAGNOSIS — E538 Deficiency of other specified B group vitamins: Secondary | ICD-10-CM

## 2020-01-08 DIAGNOSIS — E1142 Type 2 diabetes mellitus with diabetic polyneuropathy: Secondary | ICD-10-CM

## 2020-01-08 DIAGNOSIS — E1165 Type 2 diabetes mellitus with hyperglycemia: Secondary | ICD-10-CM

## 2020-01-08 DIAGNOSIS — G63 Polyneuropathy in diseases classified elsewhere: Secondary | ICD-10-CM

## 2020-01-08 MED ORDER — INSULIN LISPRO (1 UNIT DIAL) 100 UNIT/ML (KWIKPEN): PEN_INJECTOR | SUBCUTANEOUS | 0 refills | Status: DC

## 2020-01-08 MED ORDER — LANTUS SOLOSTAR 100 UNIT/ML ~~LOC~~ SOPN: 40.0000 [IU] | PEN_INJECTOR | Freq: Every day | SUBCUTANEOUS | 3 refills | Status: DC

## 2020-01-08 NOTE — Assessment & Plan Note (Signed)
Continue Lipitor 10 mg daily.  Will need lipid panel next blood draw.

## 2020-01-08 NOTE — Progress Notes (Signed)
   Wyatt Meyer is a 59 y.o. male who presents today for an office visit.  He is transitioning care to [place] as PCP.   Assessment/Plan:  Chronic Problems Addressed Today: History of prostate cancer Place referral to urology.  Hyperlipidemia Continue Lipitor 10 mg daily.  Will need lipid panel next blood draw.  Vitamin B12 deficiency Could be contributing to his peripheral neuropathy.  Will check B12 next blood draw.  Uncontrolled type 2 diabetes mellitus with peripheral neuropathy (HCC) Last A1c 12.  Too early to recheck today.  He will follow-up with endocrinology.  Will refill his insulin today.  Continue Lantus 40 units daily, Humalog 18 to 22 units with meals 3 times daily, Metformin 1000 mg twice daily, and Trulicity 1.5 mg weekly.  Peripheral neuropathy Likely secondary to uncontrolled diabetes though B12 may be playing a role.  Will check B12 at next blood draw as noted above.  Erectile dysfunction following radical prostatectomy Will place referral to urology.  He can continue using sildenafil as needed.  Preventative Healthcare Discussed colon cancer screening options.  Will cover this and let me know if he wishes to have Cologuard or colonoscopy referral.    Subjective:  HPI:  Patient here to transfer care.  Was last seen in this clinic about 2 years ago.  Has not followed up due to loss of insurance.  Over last 2 months he has followed up with endocrinology and has restarted his insulin therapy.  He is currently on Lantus 40 units daily, Trulicity 1.5 mg weekly, Metformin 1000 mg twice daily, and Kenalog 18 to 22 units 3 times daily with meals.  Seems to be tolerating all as well.  He requests referral to see a urologist.  He has not been seen for a couple years.  Has a history of prostate cancer with a radical prostatectomy in 2013.  He is on atorvastatin for cholesterol management and is doing well this.  He has noticed increasing swelling in his lower extremities  with unusual sensation in the bottom of the feet as well.  He has had low B12 in the past and is concerned that it may be the cause.  Seems to be worsening the last 2 months        Objective:  Physical Exam: BP 130/76 (BP Location: Left Arm, Patient Position: Sitting, Cuff Size: Large)   Pulse 96   Temp (!) 97 F (36.1 C) (Temporal)   Ht 6\' 1"  (1.854 m)   Wt 253 lb 3.2 oz (114.9 kg)   SpO2 97%   BMI 33.41 kg/m   Gen: No acute distress, resting comfortably CV: Regular rate and rhythm with no murmurs appreciated Pulm: Normal work of breathing, clear to auscultation bilaterally with no crackles, wheezes, or rhonchi Neuro: Grossly normal, moves all extremities Psych: Normal affect and thought content      Pranish Akhavan M. Jerline Pain, MD 01/08/2020 10:18 AM

## 2020-01-08 NOTE — Patient Instructions (Signed)
It was very nice to see you today!  Please come back soon to have blood work done.  I will refill your insulin today.  I will place a referral for you to see the urologist.  Please see me or Wyatt. Renne Crigler within the next 3 months to recheck your blood sugar.  Take care, Wyatt Meyer  Please try these tips to maintain a healthy lifestyle:   Eat at least 3 REAL meals and 1-2 snacks per day.  Aim for no more than 5 hours between eating.  If you eat breakfast, please do so within one hour of getting up.    Each meal should contain half fruits/vegetables, one quarter protein, and one quarter carbs (no bigger than a computer mouse)   Cut down on sweet beverages. This includes juice, soda, and sweet tea.     Drink at least 1 glass of water with each meal and aim for at least 8 glasses per day   Exercise at least 150 minutes every week.    Colorectal Cancer Screening  Colorectal cancer screening is a group of tests that are used to check for colorectal cancer before symptoms develop. Colorectal refers to the colon and rectum. The colon and rectum are located at the end of the digestive tract and carry bowel movements out of the body. Who should have screening? All adults starting at age 78 until age 2 should have screening. Your health care provider may recommend screening at age 86. You will have tests every 1-10 years, depending on your results and the type of screening test. You may have screening tests starting at an earlier age, or more frequently than other people, if you have any of the following risk factors:  A personal or family history of colorectal cancer or abnormal growths (polyps).  Inflammatory bowel disease, such as ulcerative colitis or Crohn's disease.  A history of having radiation treatment to the abdomen or pelvic area for cancer.  Colorectal cancer symptoms, such as changes in bowel habits or blood in your stool.  A type of colon cancer syndrome that is passed  from parent to child (hereditary), such as: ? Lynch syndrome. ? Familial adenomatous polyposis. ? Turcot syndrome. ? Peutz-Jeghers syndrome. Screening recommendations for adults who are 52-78 years old vary depending on health. How is screening done? There are several types of colorectal screening tests. You may have one or more of the following:  Guaiac-based fecal occult blood testing. For this test, a stool (feces) sample is checked for hidden (occult) blood, which could be a sign of colorectal cancer.  Fecal immunochemical test (FIT). For this test, a stool sample is checked for blood, which could be a sign of colorectal cancer.  Stool DNA test. For this test, a stool sample is checked for blood and changes in DNA that could lead to colorectal cancer.  Sigmoidoscopy. During this test, a thin, flexible tube with a camera on the end (sigmoidoscope) is used to examine the rectum and the lower colon.  Colonoscopy. During this test, a long, flexible tube with a camera on the end (colonoscope) is used to examine the entire colon and rectum. With a colonoscopy, it is possible to take a sample of tissue (biopsy) and remove small polyps during the test.  Virtual colonoscopy. Instead of a colonoscope, this type of colonoscopy uses X-rays (CT scan) and computers to produce images of the colon and rectum. What are the benefits of screening? Screening reduces your risk for colorectal cancer and can  help identify cancer at an early stage, when the cancer can be removed or treated more easily. It is common for polyps to form in the lining of the colon, especially as you age. These polyps may be cancerous or become cancerous over time. Screening can identify these polyps. What are the risks of screening? Each screening test may have different risks.  Stool sample tests have fewer risks than other types of screening tests. However, you may need more tests to confirm results from a stool sample  test.  Screening tests that involve X-rays expose you to low levels of radiation, which may slightly increase your cancer risk. The benefit of detecting cancer outweighs the slight increase in risk.  Screening tests such as sigmoidoscopy and colonoscopy may place you at risk for bleeding, intestinal damage, infection, or a reaction to medicines given during the exam. Talk with your health care provider to understand your risk for colorectal cancer and to make a screening plan that is right for you. Questions to ask your health care provider  When should I start colorectal cancer screening?  What is my risk for colorectal cancer?  How often do I need screening?  Which screening tests do I need?  How do I get my test results?  What do my results mean? Where to find more information Learn more about colorectal cancer screening from:  The [employer]: www.cancer.org  The [employer]: www.cancer.gov Summary  Colorectal cancer screening is a group of tests used to check for colorectal cancer before symptoms develop.  Screening reduces your risk for colorectal cancer and can help identify cancer at an early stage, when the cancer can be removed or treated more easily.  All adults starting at age 10 until age 84 should have screening. Your health care provider may recommend screening at age 51.  You may have screening tests starting at an earlier age, or more frequently than other people, if you have certain risk factors.  Talk with your health care provider to understand your risk for colorectal cancer and to make a screening plan that is right for you. This information is not intended to replace advice given to you by your health care provider. Make sure you discuss any questions you have with your health care provider. Document Revised: 12/31/2018 Document Reviewed: 06/12/2017 Elsevier Patient Education  [place].

## 2020-01-08 NOTE — Assessment & Plan Note (Signed)
Last A1c 12.  Too early to recheck today.  He will follow-up with endocrinology.  Will refill his insulin today.  Continue Lantus 40 units daily, Humalog 18 to 22 units with meals 3 times daily, Metformin 1000 mg twice daily, and Trulicity 1.5 mg weekly.

## 2020-01-08 NOTE — Assessment & Plan Note (Signed)
Likely secondary to uncontrolled diabetes though B12 may be playing a role.  Will check B12 at next blood draw as noted above.

## 2020-01-08 NOTE — Assessment & Plan Note (Signed)
Will place referral to urology.  He can continue using sildenafil as needed.

## 2020-01-08 NOTE — Assessment & Plan Note (Signed)
Could be contributing to his peripheral neuropathy.  Will check B12 next blood draw.

## 2020-01-08 NOTE — Assessment & Plan Note (Signed)
Place referral to urology.

## 2020-01-14 ENCOUNTER — Telehealth: Payer: Self-pay | Admitting: *Deleted

## 2020-01-14 ENCOUNTER — Other Ambulatory Visit: Payer: Self-pay | Admitting: *Deleted

## 2020-01-14 MED ORDER — INSULIN ASPART 100 UNIT/ML ~~LOC~~ SOLN: SUBCUTANEOUS | 0 refills | Status: DC

## 2020-01-14 NOTE — Telephone Encounter (Signed)
Rx send to pharmacy  

## 2020-01-14 NOTE — Telephone Encounter (Signed)
[facility] pharmacy  Insulin Lispro 100unit/ml Kwikpen not cover by plan  Plan prefers MUST USE [place], Novolog

## 2020-01-14 NOTE — Telephone Encounter (Signed)
Ok to send in novolog with same sig.  [name]. Jerline Pain, MD 01/14/2020 1:31 PM

## 2020-02-01 ENCOUNTER — Ambulatory Visit: Payer: BC Managed Care – PPO | Admitting: Internal Medicine

## 2020-02-09 ENCOUNTER — Other Ambulatory Visit: Payer: Self-pay | Admitting: *Deleted

## 2020-02-09 MED ORDER — INSULIN LISPRO (1 UNIT DIAL) 100 UNIT/ML (KWIKPEN): PEN_INJECTOR | SUBCUTANEOUS | 0 refills | Status: DC

## 2020-02-10 ENCOUNTER — Telehealth: Payer: Self-pay | Admitting: Internal Medicine

## 2020-02-10 NOTE — Telephone Encounter (Signed)
Patient dismissed from [facility] by Philemon Kingdom, MD, effective 01/29/20. Dismissal Letter was handed to the patient at his last appointment. KLM

## 2020-02-15 ENCOUNTER — Telehealth: Payer: Self-pay | Admitting: Family Medicine

## 2020-02-15 ENCOUNTER — Other Ambulatory Visit: Payer: Self-pay | Admitting: *Deleted

## 2020-02-15 MED ORDER — INSULIN ASPART 100 UNIT/ML FLEXPEN: 18.0000 [IU] | PEN_INJECTOR | Freq: Three times a day (TID) | SUBCUTANEOUS | 11 refills | Status: DC

## 2020-02-15 NOTE — Telephone Encounter (Signed)
Rx send to pharmacy  

## 2020-02-15 NOTE — Telephone Encounter (Signed)
[place] with me. Please place any necessary orders.  [name]. Jerline Pain, MD 02/15/2020 11:01 AM

## 2020-02-15 NOTE — Telephone Encounter (Signed)
[facility] calling to see if they can change his medication to [place] log Flex Pen. Insurance will not pay for previous script. Please Advise.

## 2020-02-15 NOTE — Telephone Encounter (Signed)
Please advise 

## 2020-03-20 ENCOUNTER — Encounter: Payer: Self-pay | Admitting: Family Medicine

## 2020-03-21 NOTE — Telephone Encounter (Signed)
Called and LVM for pt to schedule an appointment

## 2020-04-19 ENCOUNTER — Other Ambulatory Visit: Payer: Self-pay

## 2020-04-19 ENCOUNTER — Ambulatory Visit (INDEPENDENT_AMBULATORY_CARE_PROVIDER_SITE_OTHER): Payer: BC Managed Care – PPO | Admitting: Family Medicine

## 2020-04-19 ENCOUNTER — Encounter: Payer: Self-pay | Admitting: Family Medicine

## 2020-04-19 VITALS — BP 113/75 | HR 93 | Temp 97.7°F | Ht 73.0 in | Wt 247.0 lb

## 2020-04-19 DIAGNOSIS — E1142 Type 2 diabetes mellitus with diabetic polyneuropathy: Secondary | ICD-10-CM | POA: Diagnosis not present

## 2020-04-19 DIAGNOSIS — IMO0002 Reserved for concepts with insufficient information to code with codable children: Secondary | ICD-10-CM

## 2020-04-19 DIAGNOSIS — R600 Localized edema: Secondary | ICD-10-CM

## 2020-04-19 DIAGNOSIS — E1165 Type 2 diabetes mellitus with hyperglycemia: Secondary | ICD-10-CM

## 2020-04-19 NOTE — Progress Notes (Signed)
° °  Wyatt Meyer is a 59 y.o. male who presents today for an office visit.  Assessment/Plan:  Chronic Problems Addressed Today: Uncontrolled type 2 diabetes mellitus with peripheral neuropathy [facility]) Patient dismissed from previous endocrinologist due to no-shows.  Gave samples of Basaglar today.  He will continue Humalog, Metformin, and Trulicity at previous doses.  Will place referral to endocrinologist.  He is having more peripheral neuropathy likely due to poor glycemic control.  Check A1c, CBC, CMET, TSH, and lipid panel today.     Subjective:  HPI:  Patient here for follow-up visit.  Last seen 3 months ago.  Since our last visit he has had well.  He requests referral to see endocrinologist.  He has not seen endocrinologist for several months.  He was not able to refill his basal insulin due to financial reasons and has been taking Trulicity, Metformin, and Humalog with meals.  He is also noticed more swelling in his legs.  No chest pain or shortness of breath.  No orthopnea.       Objective:  Physical Exam: BP 113/75    Pulse 93    Temp 97.7 F (36.5 C)    Ht 6\' 1"  (1.854 m)    Wt (!) 247 lb (112 kg)    SpO2 99%    BMI 32.59 kg/m   Wt Readings from Last 3 Encounters:  04/19/20 (!) 247 lb (112 kg)  01/08/20 253 lb 3.2 oz (114.9 kg)  11/04/19 252 lb (114.3 kg)    Gen: No acute distress, resting comfortably CV: Regular rate and rhythm with no murmurs appreciated Pulm: Normal work of breathing, clear to auscultation bilaterally with no crackles, wheezes, or rhonchi Neuro: Grossly normal, moves all extremities Psych: Normal affect and thought content      [name], MD 04/19/2020 12:48 PM

## 2020-04-19 NOTE — Patient Instructions (Signed)
It was very nice to see you today!  We will check labs and refer you to see and endocrinologist.  Take care, Dr Jerline Pain  Please try these tips to maintain a healthy lifestyle:   Eat at least 3 REAL meals and 1-2 snacks per day.  Aim for no more than 5 hours between eating.  If you eat breakfast, please do so within one hour of getting up.    Each meal should contain half fruits/vegetables, one quarter protein, and one quarter carbs (no bigger than a computer mouse)   Cut down on sweet beverages. This includes juice, soda, and sweet tea.     Drink at least 1 glass of water with each meal and aim for at least 8 glasses per day   Exercise at least 150 minutes every week.

## 2020-04-19 NOTE — Assessment & Plan Note (Signed)
Patient dismissed from previous endocrinologist due to no-shows.  Gave samples of Basaglar today.  He will continue Humalog, Metformin, and Trulicity at previous doses.  Will place referral to endocrinologist.  He is having more peripheral neuropathy likely due to poor glycemic control.  Check A1c, CBC, CMET, TSH, and lipid panel today.

## 2020-04-20 LAB — COMPREHENSIVE METABOLIC PANEL
AG Ratio: 1.4 (calc) (ref 1.0–2.5)
ALT: 56 U/L — ABNORMAL HIGH (ref 9–46)
AST: 36 U/L — ABNORMAL HIGH (ref 10–35)
Albumin: 4 g/dL (ref 3.6–5.1)
Alkaline phosphatase (APISO): 96 U/L (ref 35–144)
BUN/Creatinine Ratio: 12 (calc) (ref 6–22)
BUN: 16 mg/dL (ref 7–25)
CO2: 26 mmol/L (ref 20–32)
Calcium: 9.3 mg/dL (ref 8.6–10.3)
Chloride: 102 mmol/L (ref 98–110)
Creat: 1.33 mg/dL — ABNORMAL HIGH (ref 0.70–1.25)
Globulin: 2.9 g/dL (calc) (ref 1.9–3.7)
Glucose, Bld: 366 mg/dL — ABNORMAL HIGH (ref 65–99)
Potassium: 4.5 mmol/L (ref 3.5–5.3)
Sodium: 137 mmol/L (ref 135–146)
Total Bilirubin: 0.4 mg/dL (ref 0.2–1.2)
Total Protein: 6.9 g/dL (ref 6.1–8.1)

## 2020-04-20 LAB — TSH: TSH: 1.71 mIU/L (ref 0.40–4.50)

## 2020-04-20 LAB — MICROALBUMIN / CREATININE URINE RATIO
Creatinine, Urine: 146 mg/dL (ref 20–320)
Microalb Creat Ratio: 70 mcg/mg creat — ABNORMAL HIGH (ref <30)
Microalb, Ur: 10.2 mg/dL

## 2020-04-20 LAB — CBC
HCT: 36 % — ABNORMAL LOW (ref 38.5–50.0)
Hemoglobin: 11.7 g/dL — ABNORMAL LOW (ref 13.2–17.1)
MCH: 30.6 pg (ref 27.0–33.0)
MCHC: 32.5 g/dL (ref 32.0–36.0)
MCV: 94.2 fL (ref 80.0–100.0)
MPV: 11.1 fL (ref 7.5–12.5)
Platelets: 260 10*3/uL (ref 140–400)
RBC: 3.82 10*6/uL — ABNORMAL LOW (ref 4.20–5.80)
RDW: 12.7 % (ref 11.0–15.0)
WBC: 6.9 10*3/uL (ref 3.8–10.8)

## 2020-04-20 LAB — LIPID PANEL
Cholesterol: 250 mg/dL — ABNORMAL HIGH (ref <200)
HDL: 38 mg/dL — ABNORMAL LOW (ref >=40)
LDL Cholesterol (Calc): 170 mg/dL (calc) — ABNORMAL HIGH
Non-HDL Cholesterol (Calc): 212 mg/dL (calc) — ABNORMAL HIGH (ref <130)
Total CHOL/HDL Ratio: 6.6 (calc) — ABNORMAL HIGH (ref <5.0)
Triglycerides: 255 mg/dL — ABNORMAL HIGH (ref <150)

## 2020-04-20 LAB — HEMOGLOBIN A1C
Hgb A1c MFr Bld: 11.1 % of total Hgb — ABNORMAL HIGH (ref <5.7)
Mean Plasma Glucose: 272 (calc)
eAG (mmol/L): 15.1 (calc)

## 2020-04-21 NOTE — Progress Notes (Signed)
Please inform patient of the following:  Cholesterol is elevated and his blood sugar is very high. HE is also showing some signs of kidney damage that I think is due to his blood sugar being uncontrolled.   It is very important that we work on bringing his blood sugars back down to goal. He should be hearing from endocrinology soon. Would like for him to let us know if he has not heard from endo yet.  Also recommend increasing his lipitor to 40mg  daily. Please send in if patient is willing to start.  [name]. Jerline Pain, MD 04/21/2020 8:42 AM

## 2020-05-09 ENCOUNTER — Other Ambulatory Visit: Payer: Self-pay | Admitting: Internal Medicine

## 2020-05-09 DIAGNOSIS — E1142 Type 2 diabetes mellitus with diabetic polyneuropathy: Secondary | ICD-10-CM

## 2020-05-09 DIAGNOSIS — IMO0002 Reserved for concepts with insufficient information to code with codable children: Secondary | ICD-10-CM

## 2020-05-11 ENCOUNTER — Other Ambulatory Visit: Payer: Self-pay | Admitting: Internal Medicine

## 2020-05-11 DIAGNOSIS — E1142 Type 2 diabetes mellitus with diabetic polyneuropathy: Secondary | ICD-10-CM

## 2020-05-11 DIAGNOSIS — IMO0002 Reserved for concepts with insufficient information to code with codable children: Secondary | ICD-10-CM

## 2020-05-23 ENCOUNTER — Ambulatory Visit: Payer: BC Managed Care – PPO | Admitting: Family Medicine

## 2020-05-23 DIAGNOSIS — Z0289 Encounter for other administrative examinations: Secondary | ICD-10-CM

## 2020-07-06 ENCOUNTER — Telehealth: Payer: Self-pay

## 2020-07-06 NOTE — Telephone Encounter (Signed)
patietn would like to reopen endocrinology referral to get another appt with different doctor

## 2020-07-06 NOTE — Telephone Encounter (Signed)
Hi Lattie Haw, it looks like pt is a current pt of Dr. Renne Crigler but he is wanting to see a new provider. Can he just call your office and request to see someone else?

## 2020-07-12 ENCOUNTER — Ambulatory Visit (INDEPENDENT_AMBULATORY_CARE_PROVIDER_SITE_OTHER): Payer: BC Managed Care – PPO | Admitting: Family Medicine

## 2020-07-12 ENCOUNTER — Other Ambulatory Visit: Payer: Self-pay

## 2020-07-12 VITALS — BP 146/74 | HR 90 | Temp 98.2°F | Ht 73.0 in | Wt 245.2 lb

## 2020-07-12 DIAGNOSIS — R8290 Unspecified abnormal findings in urine: Secondary | ICD-10-CM

## 2020-07-12 DIAGNOSIS — L97522 Non-pressure chronic ulcer of other part of left foot with fat layer exposed: Secondary | ICD-10-CM | POA: Diagnosis not present

## 2020-07-12 DIAGNOSIS — E1165 Type 2 diabetes mellitus with hyperglycemia: Secondary | ICD-10-CM | POA: Diagnosis not present

## 2020-07-12 DIAGNOSIS — IMO0002 Reserved for concepts with insufficient information to code with codable children: Secondary | ICD-10-CM

## 2020-07-12 DIAGNOSIS — E1142 Type 2 diabetes mellitus with diabetic polyneuropathy: Secondary | ICD-10-CM | POA: Diagnosis not present

## 2020-07-12 LAB — POCT GLYCOSYLATED HEMOGLOBIN (HGB A1C): Hemoglobin A1C: 9.6 % — AB (ref 4.0–5.6)

## 2020-07-12 LAB — POCT URINALYSIS DIPSTICK
Bilirubin, UA: NEGATIVE
Blood, UA: NEGATIVE
Glucose, UA: POSITIVE — AB
Ketones, UA: NEGATIVE
Leukocytes, UA: NEGATIVE
Nitrite, UA: NEGATIVE
Protein, UA: NEGATIVE
Spec Grav, UA: 1.02 (ref 1.010–1.025)
Urobilinogen, UA: NEGATIVE E.U./dL — AB
pH, UA: 5.5 (ref 5.0–8.0)

## 2020-07-12 MED ORDER — LANTUS SOLOSTAR 100 UNIT/ML ~~LOC~~ SOPN
40.0000 [IU] | PEN_INJECTOR | Freq: Every day | SUBCUTANEOUS | 5 refills | Status: DC
Start: 2020-07-12 — End: 2020-11-15

## 2020-07-12 MED ORDER — CEPHALEXIN 500 MG PO CAPS: 500.0000 mg | ORAL_CAPSULE | Freq: Two times a day (BID) | ORAL | 0 refills | Status: AC

## 2020-07-12 NOTE — Progress Notes (Signed)
   Wyatt Meyer is a 59 y.o. male who presents today for an office visit.  Assessment/Plan:  New/Acute Problems: Foot Ulcer No signs of infection or osteomyelitis.  Patient has no palpable pulses.  Will check ABI.  Will likely need referral to vascular surgery.  Instructed patient to keep the area clean and dry.  Would consider referral to wound care or podiatry depending on his ultrasound.  Discussed reasons to return to care.  Cough Normal lung exam today.  Possibly recovering from URI.  He has also a smoker and likely has underlying lung disease.  Will treat UTI with Keflex as below.  If persists will need lung imaging and/or referral to pulmonology.  UTI UA negative today.  Will start empiric Keflex while we await culture results.  Encourage good oral hydration.  Fatigue Likely multifactorial in setting of UTI.  He did have elevated transaminases and creatinine on blood draw from few months ago as well as low hemoglobin.  We will recheck CBC in CMET today.  If not improving and labs are stable will need to look further cardiac/pulmonary etiologies.  Discussed reasons to return to care.  Chronic Problems Addressed Today: No problem-specific Assessment & Plan notes found for this encounter.     Subjective:  HPI:   Patient here for follow-up.  Last seen 3 months ago.  A1c was 11.1.  He has been relatively compliant with medications since her last visit though has not been on Lantus for the last 2 weeks as he says that he ran out.  He is currently on Trulicity 1.5 mg weekly and Humalog 3 times daily with meals.  He has been prescribed Lantus 4 units daily as well.  He has several other issues that he would like to discuss today.  He has had UTI symptoms for the last couple of weeks.  He has noticed foul-smelling odor.  No reported fevers or chills but does have been having more fatigue.  Is also having more cough and congestion for the past several weeks.  No shortness of breath.   Does find that he gets more fatigued with exertion than normal.  He has also had a "blister" on his foot for the past several weeks.  He has been try to keep the areas clean and dry as possible.  No pain due to neuropathy.       Objective:  Physical Exam: BP (!) 146/74   Pulse 90   Temp 98.2 F (36.8 C) (Temporal)   Ht 6\' 1"  (1.854 m)   Wt 245 lb 3.2 oz (111.2 kg)   SpO2 100%   BMI 32.35 kg/m   Gen: No acute distress, resting comfortably CV: Regular rate and rhythm with no murmurs appreciated Pulm: Normal work of breathing, clear to auscultation bilaterally with no crackles, wheezes, or rhonchi MSK:  -Left foot: Ulceration approximately 1.5 cm in diameter with subcutaneous tissue exposed on distal aspect of great toe.  No surrounding erythema.  No purulent drainage.  DP and PT pulses not palpable. - Right Foot: Erythematous area on distal right toe approximately 1 cm in diameter.  DP and PT pulses nonpalpable. Neuro: Grossly normal, moves all extremities Psych: Normal affect and thought content      Wyatt Meyer M. Jerline Pain, MD 07/12/2020 11:45 AM

## 2020-07-12 NOTE — Patient Instructions (Signed)
It was very nice to see you today!  Your blood sugar looks much better today. Please continue your current medications.  I will send in your Lantus.  You can schedule appoint with Dr. Leonette Monarch, Dr. Loanne Drilling, or Dr. Dwyane Dee to manage your diabetes.  I am concerned that she may have a UTI.  Will check urine culture.  Please start the Keflex.  Make sure that you get plenty of fluids.  This antibiotic should clear any signs of bronchitis or sinus infection as well.  I am concerned about the blood flow in your feet.  We will place a referral for you get an ultrasound done.  Depending on the results we may need to refer you to see a vein and vascular doctor.  Please let me know if your wound shows any signs of infection.  Please keep the area clean and dry.  We will check blood work today.  Your fatigue is likely due to the infection that you are having.  Please let us know if your fatigue does not improve with treatment as above.  Take care, Dr Jerline Pain  Please try these tips to maintain a healthy lifestyle:   Eat at least 3 REAL meals and 1-2 snacks per day.  Aim for no more than 5 hours between eating.  If you eat breakfast, please do so within one hour of getting up.    Each meal should contain half fruits/vegetables, one quarter protein, and one quarter carbs (no bigger than a computer mouse)   Cut down on sweet beverages. This includes juice, soda, and sweet tea.     Drink at least 1 glass of water with each meal and aim for at least 8 glasses per day   Exercise at least 150 minutes every week.

## 2020-07-12 NOTE — Assessment & Plan Note (Signed)
A1c better but still not controlled ad 9.6.  He has been off basal insulin for the last few weeks.  Will restart today.  Continue Trulicity 1.5 mg weekly and Humalog with meals.  He will be following up with endocrinology soon hopefully.

## 2020-07-14 LAB — CBC
HCT: 32.3 % — ABNORMAL LOW (ref 38.5–50.0)
Hemoglobin: 10.5 g/dL — ABNORMAL LOW (ref 13.2–17.1)
MCH: 30.1 pg (ref 27.0–33.0)
MCHC: 32.5 g/dL (ref 32.0–36.0)
MCV: 92.6 fL (ref 80.0–100.0)
MPV: 11.4 fL (ref 7.5–12.5)
Platelets: 279 10*3/uL (ref 140–400)
RBC: 3.49 10*6/uL — ABNORMAL LOW (ref 4.20–5.80)
RDW: 13.1 % (ref 11.0–15.0)
WBC: 7.7 10*3/uL (ref 3.8–10.8)

## 2020-07-14 LAB — COMPREHENSIVE METABOLIC PANEL
AG Ratio: 1.4 (calc) (ref 1.0–2.5)
ALT: 37 U/L (ref 9–46)
AST: 31 U/L (ref 10–35)
Albumin: 3.9 g/dL (ref 3.6–5.1)
Alkaline phosphatase (APISO): 100 U/L (ref 35–144)
BUN/Creatinine Ratio: 14 (calc) (ref 6–22)
BUN: 19 mg/dL (ref 7–25)
CO2: 27 mmol/L (ref 20–32)
Calcium: 9.1 mg/dL (ref 8.6–10.3)
Chloride: 103 mmol/L (ref 98–110)
Creat: 1.32 mg/dL — ABNORMAL HIGH (ref 0.70–1.25)
Globulin: 2.8 g/dL (calc) (ref 1.9–3.7)
Glucose, Bld: 338 mg/dL — ABNORMAL HIGH (ref 65–99)
Potassium: 4.8 mmol/L (ref 3.5–5.3)
Sodium: 137 mmol/L (ref 135–146)
Total Bilirubin: 0.3 mg/dL (ref 0.2–1.2)
Total Protein: 6.7 g/dL (ref 6.1–8.1)

## 2020-07-14 LAB — URINE CULTURE
MICRO NUMBER:: 11090140
SPECIMEN QUALITY:: ADEQUATE

## 2020-07-14 LAB — TSH: TSH: 1.19 mIU/L (ref 0.40–4.50)

## 2020-07-14 NOTE — Progress Notes (Signed)
Please inform patient of the following:  Urine culture confirms UTI.  The antibiotic we started him on should treat this.  His blood counts are dropping.  Do not have a clear explanation for this.  Like for him to come back for repeat labs.  Please place future order for CBC, ferritin, and iron.  Everything else is stable compared to last time.

## 2020-07-15 ENCOUNTER — Other Ambulatory Visit: Payer: Self-pay | Admitting: *Deleted

## 2020-07-15 DIAGNOSIS — D72818 Other decreased white blood cell count: Secondary | ICD-10-CM

## 2020-07-15 NOTE — Telephone Encounter (Signed)
Yes the pt can just request a transfer of care

## 2020-07-18 ENCOUNTER — Other Ambulatory Visit: Payer: Self-pay

## 2020-07-18 DIAGNOSIS — D72818 Other decreased white blood cell count: Secondary | ICD-10-CM

## 2020-07-18 NOTE — Telephone Encounter (Signed)
Called and spoke with pt and he will call endocrinology to do a TOC.

## 2020-07-19 ENCOUNTER — Other Ambulatory Visit: Payer: Self-pay

## 2020-07-19 ENCOUNTER — Other Ambulatory Visit: Payer: BC Managed Care – PPO

## 2020-07-19 DIAGNOSIS — D72818 Other decreased white blood cell count: Secondary | ICD-10-CM

## 2020-07-20 ENCOUNTER — Telehealth: Payer: Self-pay | Admitting: *Deleted

## 2020-07-20 LAB — CBC
HCT: 33.2 % — ABNORMAL LOW (ref 38.5–50.0)
Hemoglobin: 10.4 g/dL — ABNORMAL LOW (ref 13.2–17.1)
MCH: 29.4 pg (ref 27.0–33.0)
MCHC: 31.3 g/dL — ABNORMAL LOW (ref 32.0–36.0)
MCV: 93.8 fL (ref 80.0–100.0)
MPV: 11.3 fL (ref 7.5–12.5)
Platelets: 298 10*3/uL (ref 140–400)
RBC: 3.54 10*6/uL — ABNORMAL LOW (ref 4.20–5.80)
RDW: 13.1 % (ref 11.0–15.0)
WBC: 7 10*3/uL (ref 3.8–10.8)

## 2020-07-20 LAB — IRON,TIBC AND FERRITIN PANEL
%SAT: 12 % (calc) — ABNORMAL LOW (ref 20–48)
Ferritin: 26 ng/mL (ref 24–380)
Iron: 42 ug/dL — ABNORMAL LOW (ref 50–180)
TIBC: 348 mcg/dL (calc) (ref 250–425)

## 2020-07-20 NOTE — Telephone Encounter (Signed)
PA for Rx Lantus SoloStar 100UNIT/ML pen-injectors was Sent to Plan today awaiting for determination

## 2020-07-25 ENCOUNTER — Other Ambulatory Visit: Payer: Self-pay

## 2020-07-25 DIAGNOSIS — Z1211 Encounter for screening for malignant neoplasm of colon: Secondary | ICD-10-CM

## 2020-07-25 NOTE — Progress Notes (Signed)
Please inform patient of the following:  Iron levels are borderline low. Recommend that he start ferrous sulfate 65mg  every other day on an empty stomach with vitamin C and we can recheck in 3-6 months.   Also strongly recommend colonoscopy as he is due for one and we need to make sure he is not having any internal bleeding. Please place referral for colonoscopy if patient is willing. At the very least would recommend fobt card x3 if he declines colonoscopy.  Wyatt Meyer. Jerline Pain, MD 07/25/2020 10:35 AM

## 2020-07-29 ENCOUNTER — Ambulatory Visit (HOSPITAL_COMMUNITY)
Admission: RE | Admit: 2020-07-29 | Discharge: 2020-07-29 | Disposition: A | Discharge status: Home / Self Care | Payer: BC Managed Care – PPO | Source: Ambulatory Visit | Attending: Family Medicine | Admitting: Family Medicine

## 2020-07-29 ENCOUNTER — Other Ambulatory Visit: Payer: Self-pay

## 2020-07-29 DIAGNOSIS — L97522 Non-pressure chronic ulcer of other part of left foot with fat layer exposed: Secondary | ICD-10-CM | POA: Insufficient documentation

## 2020-08-01 ENCOUNTER — Telehealth: Payer: Self-pay | Admitting: *Deleted

## 2020-08-01 NOTE — Telephone Encounter (Signed)
Deniedon November 5 Your PA request has been denied Drug Lantus SoloStar 100UNIT/ML pen-injectors

## 2020-08-01 NOTE — Telephone Encounter (Signed)
Please let patient know. He may need to check with his insurance to see what is covered.  Wyatt Meyer. Jerline Pain, MD 08/01/2020 12:59 PM

## 2020-08-01 NOTE — Telephone Encounter (Signed)
Called and lm on pt vm tcb. 

## 2020-08-01 NOTE — Progress Notes (Signed)
Please inform patient of the following:  Ultrasound showed normal blood flow in his legs. Would like for him to let us know if the ulceration on his toe has not improved.  [name]. Jerline Pain, MD 08/01/2020 2:41 PM

## 2020-08-02 ENCOUNTER — Encounter: Payer: Self-pay | Admitting: Gastroenterology

## 2020-08-05 ENCOUNTER — Ambulatory Visit: Payer: BC Managed Care – PPO | Attending: Internal Medicine

## 2020-08-05 DIAGNOSIS — Z23 Encounter for immunization: Secondary | ICD-10-CM

## 2020-08-05 NOTE — Progress Notes (Signed)
° °  Covid-19 Vaccination Clinic  Name:  Wyatt Meyer    MRN: [id number] DOB: [birth year]  08/05/2020  [name] was observed post Covid-19 immunization for 15 minutes without incident. He was provided with Vaccine Information Sheet and instruction to access the V-Safe system.   [name] was instructed to call 911 with any severe reactions post vaccine:  Difficulty breathing   Swelling of face and throat   A fast heartbeat   A bad rash all over body   Dizziness and weakness

## 2020-09-27 ENCOUNTER — Encounter: Payer: Self-pay | Admitting: Gastroenterology

## 2020-09-27 ENCOUNTER — Ambulatory Visit (INDEPENDENT_AMBULATORY_CARE_PROVIDER_SITE_OTHER): Payer: BC Managed Care – PPO | Admitting: Gastroenterology

## 2020-09-27 VITALS — BP 120/70 | HR 97 | Ht 73.0 in | Wt 245.0 lb

## 2020-09-27 DIAGNOSIS — D509 Iron deficiency anemia, unspecified: Secondary | ICD-10-CM

## 2020-09-27 MED ORDER — VITAMIN C 100 MG PO TABS: 100.0000 mg | ORAL_TABLET | ORAL | Status: DC

## 2020-09-27 MED ORDER — NA SULFATE-K SULFATE-MG SULF 17.5-3.13-1.6 GM/177ML PO SOLN: ORAL | 0 refills | Status: DC

## 2020-09-27 MED ORDER — IRON (FERROUS SULFATE) 325 (65 FE) MG PO TABS: 1.0000 | ORAL_TABLET | ORAL | 0 refills | Status: DC

## 2020-09-27 NOTE — Patient Instructions (Addendum)
I have recommended a colonoscopy to further evaluate your anemia.  Tips for colonoscopy:  - Stay well hydrated for 3-4 days prior to the exam. This reduces nausea and dehydration.  - To prevent skin/hemorrhoid irritation - prior to wiping, put A&Dointment or vaseline on the toilet paper. - Keep a towel or pad on the bed.  - Drink  64oz of clear liquids in the morning of prep day (prior to starting the prep) to be sure that there is enough fluid to flush the colon and stay hydrated!!!! This is in addition to the fluids required for preparation. - Use of a flavored hard candy, such as grape Rubin Payor, can counteract some of the flavor of the prep and may prevent some nausea.   COLONOSCOPY: You have been scheduled for a colonoscopy. Please follow written instructions given to you at your visit today.   PREP: Please pick up your prep supplies at the pharmacy within the next 1-3 days.  DIABETIC MEDICATIONS: Please review your instructions regarding how to take these medications.  INHALERS: If you use inhalers (even only as needed), please bring them with you on the day of your procedure.  OVER THE COUNTER MEDICATION(S): Please purchase the following medications over the counter and take as directed:  [name] Ferrous sulfate - Please take 65 mg every other day WITH vitamin C.  GI side effects are common including nausea, vomiting, diarrhea, constipation, and black stools. These side effects occur less often when you take the iron every other day.  . Vitamin C - Please take every other day WITH Ferrous Sulfate  If you are age 36 or younger, your body mass index should be between 19-25. Your Body mass index is 32.32 kg/m. If this is out of the aformentioned range listed, please consider follow up with your Primary Care Provider.   Thank you for trusting me with your gastrointestinal care!    Tressia Danas, MD, MPH

## 2020-09-27 NOTE — Progress Notes (Signed)
[facility]   Referring Provider: Vivi Barrack, MD Primary Care Physician:  Vivi Barrack, MD  Reason for Consultation: Iron deficiency anemia   IMPRESSION:  Normocytic anemia with studies suggesting iron deficiency No overt GI blood loss or GI symptoms Unexplained fatigue No prior endoscopic evaluation No family history of colon cancer or polyps   Endoscopic evaluation recommended to evaluate for GI blood loss anemia.  We discussed a strategy of starting with colonoscopy and proceeding with upper endoscopy if the colonoscopy is nondiagnostic.  In the meantime, will start oral iron supplementation.  PLAN: Ferrous sulfate 65 mg QOD Colonoscopy  Please see the "Patient Instructions" section for addition details about the plan.  HPI: Wyatt Meyer is a 60 y.o. male referred by Dr. Jerline Pain to evaluate for GI blood loss anemia.  The history is obtained through the patient and review of his electronic health record.  Labs obtained 07/19/2020 to evaluate multiple symptoms including fatigue showed an iron of 42, ferritin 26, percent saturation 12, hemoglobin 10.4, MCV 93.8, RDW 13.1, platelets 298.  Oral iron supplements were recommended but not started.   No weakness, headache, irritability, exercise intolerance, exertional dyspnea, vertigo, or angina pectoris.  No pica.  No beeturia.  No hearing loss.    No overt GI blood loss. No melena, hematochezia, bright red blood per rectum. No epistaxis, vaginal bleeding, hemoptysis, or hematuria.   No prior endoscopic history.  No known family history of colon cancer or polyps. No family history of uterine/endometrial cancer, pancreatic cancer or gastric/stomach cancer.   Past Medical History:  Diagnosis Date  . Erectile dysfunction following radical prostatectomy 10/23/2016  . Klebsiella sepsis ([place]) 03/25/2015  . Osteoarthritis 08/11/2012  . Peripheral neuropathy 08/11/2012  . Prostate cancer ([place])   . Smoker   . SUI  (stress urinary incontinence), male 02/03/2013  . 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 PROSTATE SURGERY  2013    Current Outpatient Medications  Medication Sig Dispense Refill  . atorvastatin (LIPITOR) 10 MG tablet Take 1 tablet (10 mg total) by mouth daily. 90 tablet 3  . Continuous Blood Gluc Receiver (FREESTYLE LIBRE 14 DAY READER) DEVI 1 Device by Does not apply route daily. Use to check blood sugars daily 1 Device 0  . Continuous Blood Gluc Sensor (FREESTYLE LIBRE 14 DAY SENSOR) MISC USE AS DIRECTED EVERY 14 DAYS 2 each 4  . Dulaglutide (TRULICITY) 1.5 0000000 SOPN Inject 1.5 mg into the skin once a week. 12 pen 3  . insulin glargine (LANTUS SOLOSTAR) 100 UNIT/ML Solostar Pen Inject 40 Units into the skin at bedtime. 15 mL 5  . insulin lispro (HUMALOG KWIKPEN) 100 UNIT/ML KwikPen Inject under skin 18-22 units before each meal - 3x a day 20 pen 0  . Insulin Pen Needle (BD PEN NEEDLE NANO U/F) 32G X 4 MM MISC use four times a day 400 each 3  . metFORMIN (GLUCOPHAGE-XR) 500 MG 24 hr tablet Take 2 tablets (1,000 mg total) by mouth 2 (two) times daily with a meal. 360 tablet 3  . ONE TOUCH LANCETS MISC Use 3x a day 200 each 11  . sildenafil (VIAGRA) 100 MG tablet Dispense Sildenafil 110mg ; Take 30-60 minutes prior to sexual activity daily prn (Patient not taking: Reported on 07/12/2020)     No current facility-administered medications for this visit.    Allergies as of 09/27/2020 - Review Complete 04/19/2020  Allergen Reaction Noted  .  Codeine Nausea And Vomiting 09/24/2011    Family History  Problem Relation Age of Onset  . Hyperlipidemia Mother   . Heart disease Father   . Hyperlipidemia Sister     Social History   Socioeconomic History  . Marital status: Married    Spouse name: Not on file  . Number of children: Not on file  . Years of education: Not on file  . Highest education level:  Not on file  Occupational History  . Not on file  Tobacco Use  . Smoking status: Current Every Day Smoker    Packs/day: 0.33    Types: Cigarettes  . Smokeless tobacco: Never Used  Substance and Sexual Activity  . Alcohol use: Yes    Alcohol/week: 0.0 standard drinks    Comment: 1-2 beers per month  . Drug use: No  . Sexual activity: Yes  Other Topics Concern  . Not on file  Social History Narrative  . Not on file   Social Determinants of Health   Financial Resource Strain: Not on file  Food Insecurity: Not on file  Transportation Needs: Not on file  Physical Activity: Not on file  Stress: Not on file  Social Connections: Not on file  Intimate Partner Violence: Not on file    Review of Systems: 12 system ROS is negative except as noted above with the additions of arthritis, back pain, fatigue, shortness of breath, and swelling in his feet.   Physical Exam: General:   Alert,  well-nourished, pleasant and cooperative in NAD. No pallor. Head:  Normocephalic and atraumatic. No alopecia.  Eyes:  Sclera clear, no icterus.   Conjunctiva pink. Ears:  Normal auditory acuity. Nose:  No deformity, discharge,  or lesions. Mouth:  No deformity or lesions.  No atrophic glossitis. No cheilosis. Neck:  Supple; no masses or thyromegaly. Lungs:  Clear throughout to auscultation.   No wheezes. Heart:  Regular rate and rhythm; no murmurs. Abdomen:  Soft, nontender, nondistended, normal bowel sounds, no rebound or guarding. No hepatosplenomegaly.   Rectal:  Deferred  Msk:  Symmetrical. No boney deformities LAD: No inguinal or umbilical LAD Extremities:  No clubbing or edema. Neurologic:  Alert and  oriented x4;  grossly nonfocal Skin:  No rash or bruise. No koilonychia.  Psych:  Alert and cooperative. Normal mood and affect.     Yehya Brendle L. Orvan Falconer, MD, MPH 09/27/2020, 9:46 AM

## 2020-10-31 ENCOUNTER — Encounter: Payer: Self-pay | Admitting: Gastroenterology

## 2020-11-01 ENCOUNTER — Other Ambulatory Visit: Payer: Self-pay

## 2020-11-01 ENCOUNTER — Ambulatory Visit (AMBULATORY_SURGERY_CENTER): Payer: BC Managed Care – PPO | Admitting: Gastroenterology

## 2020-11-01 ENCOUNTER — Encounter: Payer: Self-pay | Admitting: Gastroenterology

## 2020-11-01 VITALS — BP 113/65 | HR 81 | Temp 97.3°F | Resp 14 | Ht 73.0 in | Wt 245.0 lb

## 2020-11-01 DIAGNOSIS — D509 Iron deficiency anemia, unspecified: Secondary | ICD-10-CM | POA: Diagnosis present

## 2020-11-01 DIAGNOSIS — Z538 Procedure and treatment not carried out for other reasons: Secondary | ICD-10-CM | POA: Diagnosis not present

## 2020-11-01 MED ORDER — SODIUM CHLORIDE 0.9 % IV SOLN: 500.0000 mL | INTRAVENOUS | Status: DC

## 2020-11-01 NOTE — Progress Notes (Signed)
Pt's states no medical or surgical changes since previsit or office visit. 

## 2020-11-01 NOTE — Op Note (Addendum)
[facility] Patient Name: Dianna Deshler Procedure Date: 11/01/2020 3:38 PM MRN: [id number] Endoscopist: Thornton Park MD, MD Age: 60 Referring MD:  Date of Birth: 1961/02/20 Gender: Male Account #: [id number] Procedure:                Colonoscopy Indications:              Unexplained iron deficiency anemia                           No overt GI blood loss or GI symptoms                           No prior endoscopic evaluation                           No family history of colon cancer or polyps Medicines:                Monitored Anesthesia Care Procedure:                Pre-Anesthesia Assessment:                           - Prior to the procedure, a History and Physical                            was performed, and patient medications and                            allergies were reviewed. The patient's tolerance of                            previous anesthesia was also reviewed. The risks                            and benefits of the procedure and the sedation                            options and risks were discussed with the patient.                            All questions were answered, and informed consent                            was obtained. Prior Anticoagulants: The patient has                            taken no previous anticoagulant or antiplatelet                            agents. ASA Grade Assessment: II - A patient with                            mild systemic disease. After reviewing the risks  and benefits, the patient was deemed in                            satisfactory condition to undergo the procedure.                           After obtaining informed consent, the colonoscope                            was passed under direct vision. Throughout the                            procedure, the patient's blood pressure, pulse, and                            oxygen saturations were monitored continuously. The                             Olympus [id number]) [id number] was introduced                            through the anus with the intention of advancing to                            the ileum. The scope was advanced to the transverse                            colon before the procedure was aborted. Medications                            were given. The colonoscopy was extremely difficult                            due to poor endoscopic visualization. The patient                            tolerated the procedure well. The quality of the                            bowel preparation was inadequate. No anatomical                            landmarks were photographed. Scope In: 3:46:08 PM Scope Out: 4:53:64 PM Total Procedure Duration: 0 hours 3 minutes 3 seconds  Findings:                 The perianal and digital rectal examinations were                            normal.                           Copious quantities of stool was found in the entire  colon, precluding visualization. Lavage of the area                            was performed using greater than 500 mL, resulting                            in incomplete clearance with continued poor                            visualization. Complications:            No immediate complications. Estimated Blood Loss:     Estimated blood loss: none. Impression:               - Preparation of the colon was inadequate.                           - Stool in the entire examined colon.                           - No specimens collected. Recommendation:           - Patient has a contact number available for                            emergencies. The signs and symptoms of potential                            delayed complications were discussed with the                            patient. Return to normal activities tomorrow.                            Written discharge instructions were provided to the                             patient.                           - Resume previous diet.                           - Continue present medications.                           - Repeat colonoscopy at the next available                            appointment because the bowel preparation was poor.                            Use a two day bowel prep at that time. He used 300                            mL of magnesium citrate in addition to Suprep in  preparation for the procedure today. Thornton Park MD, MD 11/01/2020 4:06:02 PM This report has been signed electronically.

## 2020-11-01 NOTE — Patient Instructions (Signed)
YOU HAD AN ENDOSCOPIC PROCEDURE TODAY AT [facility]:   Refer to the procedure report that was given to you for any specific questions about what was found during the examination.  If the procedure report does not answer your questions, please call your gastroenterologist to clarify.  If you requested that your care partner not be given the details of your procedure findings, then the procedure report has been included in a sealed envelope for you to review at your convenience later.  YOU SHOULD EXPECT: Some feelings of bloating in the abdomen. Passage of more gas than usual.  Walking can help get rid of the air that was put into your GI tract during the procedure and reduce the bloating. If you had a lower endoscopy (such as a colonoscopy or flexible sigmoidoscopy) you may notice spotting of blood in your stool or on the toilet paper. If you underwent a bowel prep for your procedure, you may not have a normal bowel movement for a few days.  Please Note:  You might notice some irritation and congestion in your nose or some drainage.  This is from the oxygen used during your procedure.  There is no need for concern and it should clear up in a day or so.  SYMPTOMS TO REPORT IMMEDIATELY:   Following lower endoscopy (colonoscopy or flexible sigmoidoscopy):  Excessive amounts of blood in the stool  Significant tenderness or worsening of abdominal pains  Swelling of the abdomen that is new, acute  Fever of 100F or higher  For urgent or emergent issues, a gastroenterologist can be reached at any hour by calling [phone]. Do not use MyChart messaging for urgent concerns.    DIET:  We do recommend a small meal at first, but then you may proceed to your regular diet.  Drink plenty of fluids but you should avoid alcoholic beverages for 24 hours.  MEDICATIONS: Continue present medications.  FOLLOW UP: Repeat colonoscopy at the next available appointment because the bowel  preparation was poor. See appointments as scheduled as follows:      1. Previsit appointment on Tuesday March 1, at 1:30pm, please check in on the 3rd floor.      2. Colonoscopy appointment on Monday March 21, at 3:00pm (arrive at 2:00pm), check in on the 4th floor.  Thank you for allowing [place] to provide for your healthcare needs today.  ACTIVITY:  You should plan to take it easy for the rest of today and you should NOT DRIVE or use heavy machinery until tomorrow (because of the sedation medicines used during the test).    FOLLOW UP: Our staff will call the number listed on your records 48-72 hours following your procedure to check on you and address any questions or concerns that you may have regarding the information given to you following your procedure. If we do not reach you, we will leave a message.  We will attempt to reach you two times.  During this call, we will ask if you have developed any symptoms of COVID 19. If you develop any symptoms (ie: fever, flu-like symptoms, shortness of breath, cough etc.) before then, please call [phone].  If you test positive for Covid 19 in the 2 weeks post procedure, please call and report this information to [place].    If any biopsies were taken you will be contacted by phone or by letter within the next 1-3 weeks.  Please call us at [phone] if you have not heard about  the biopsies in 3 weeks.    SIGNATURES/CONFIDENTIALITY: You and/or your care partner have signed paperwork which will be entered into your electronic medical record.  These signatures attest to the fact that that the information above on your After Visit Summary has been reviewed and is understood.  Full responsibility of the confidentiality of this discharge information lies with you and/or your care-partner.

## 2020-11-01 NOTE — Progress Notes (Signed)
pt tolerated well. VSS. awake and to recovery. Report given to RN. Poor prep procedure aborted

## 2020-11-03 ENCOUNTER — Telehealth: Payer: Self-pay

## 2020-11-03 NOTE — Telephone Encounter (Signed)
No answer, left message to call if having any issues or concerns, B.Ryken Paschal RN 

## 2020-11-03 NOTE — Telephone Encounter (Signed)
  Follow up Call-  Call back number 11/01/2020  Post procedure Call Back phone  # [phone]  Permission to leave phone message Yes  Some recent data might be hidden     Left message

## 2020-11-11 ENCOUNTER — Telehealth: Payer: Self-pay | Admitting: *Deleted

## 2020-11-11 NOTE — Telephone Encounter (Signed)
Hi Dr B-  Do you want to add anything additional to our typical 2-day prep Which is 7 doses of Miralax 2 days before standard split dose of Suprep? I see this patient had MagCitrate and Suprep and still had an inadequate prep.  Thanks ! Raquel Sarna

## 2020-11-11 NOTE — Telephone Encounter (Signed)
I recommend: once daily Miralax 17g and daily dulcolax 10 mg for the 7 days prior to the bowel prep; at least 5 days of a low residue diet; routine 2 day bowel prep.  Thanks.

## 2020-11-14 ENCOUNTER — Telehealth: Payer: Self-pay

## 2020-11-14 NOTE — Telephone Encounter (Signed)
Patient states that he went to the pharmacy today for a reill on MEDICATION:insulin glargine (LANTUS SOLOSTAR) 100 UNIT/ML Solostar Pen // insulin lispro (HUMALOG KWIKPEN) 100 UNIT/ML KwikPen and he was told this medication was no longer covered under his insurance.

## 2020-11-14 NOTE — Telephone Encounter (Signed)
Insurance will cover BASAGLAR, LEVEMIR, alternatives are BASAGLAR 100 UNITS // LEVEMIR INJ.

## 2020-11-15 ENCOUNTER — Other Ambulatory Visit: Payer: Self-pay | Admitting: *Deleted

## 2020-11-15 MED ORDER — LANTUS SOLOSTAR 100 UNIT/ML ~~LOC~~ SOPN
40.0000 [IU] | PEN_INJECTOR | Freq: Every day | SUBCUTANEOUS | 5 refills | Status: DC
Start: 2020-11-15 — End: 2021-11-14

## 2020-11-15 NOTE — Telephone Encounter (Signed)
Rx send to pharmacy  

## 2020-11-22 ENCOUNTER — Ambulatory Visit (AMBULATORY_SURGERY_CENTER): Payer: Self-pay

## 2020-11-22 ENCOUNTER — Other Ambulatory Visit: Payer: Self-pay

## 2020-11-22 VITALS — Ht 73.0 in | Wt 245.0 lb

## 2020-11-22 DIAGNOSIS — D509 Iron deficiency anemia, unspecified: Secondary | ICD-10-CM

## 2020-11-22 MED ORDER — NA SULFATE-K SULFATE-MG SULF 17.5-3.13-1.6 GM/177ML PO SOLN: 1.0000 | Freq: Once | ORAL | 0 refills | Status: AC

## 2020-11-22 NOTE — Progress Notes (Signed)
No egg or soy allergy known to patient  No issues with past sedation with any surgeries or procedures No intubation problems in the past  No FH of Malignant Hyperthermia No diet pills per patient No home 02 use per patient  No blood thinners per patient  Pt reports issues with constipation - no meds at home;  No A fib or A flutter  EMMI video via [place] 19 guidelines implemented in [place] today with Pt and RN   Pt denies loose or missing teeth, denies dentures, partials, dental implants, capped or bonded teeth; Coupon given to pt in [place] today, Code to Pharmacy and  NO PA's for preps discussed with pt in [facility] today  Discussed with pt there will be an out-of-pocket cost for prep and that varies from $0 to 70 dollars  Due to the COVID-19 pandemic we are asking patients to follow certain guidelines.  Pt aware of COVID protocols and [facility] guidelines

## 2020-11-28 ENCOUNTER — Other Ambulatory Visit: Payer: Self-pay | Admitting: Internal Medicine

## 2020-11-29 ENCOUNTER — Encounter: Payer: Self-pay | Admitting: Gastroenterology

## 2020-12-08 ENCOUNTER — Encounter: Payer: Self-pay | Admitting: Gastroenterology

## 2020-12-12 ENCOUNTER — Encounter: Payer: BC Managed Care – PPO | Admitting: Gastroenterology

## 2021-01-13 ENCOUNTER — Other Ambulatory Visit: Payer: Self-pay

## 2021-01-13 ENCOUNTER — Ambulatory Visit (AMBULATORY_SURGERY_CENTER): Payer: Self-pay

## 2021-01-13 VITALS — Ht 73.0 in | Wt 244.0 lb

## 2021-01-13 DIAGNOSIS — Z1211 Encounter for screening for malignant neoplasm of colon: Secondary | ICD-10-CM

## 2021-01-13 DIAGNOSIS — D509 Iron deficiency anemia, unspecified: Secondary | ICD-10-CM

## 2021-01-13 NOTE — Progress Notes (Signed)
No egg or soy allergy known to patient  No issues with past sedation with any surgeries or procedures Patient denies ever being told they had issues or difficulty with intubation  No FH of Malignant Hyperthermia No diet pills per patient No home 02 use per patient  No blood thinners per patient  Pt denies issues with constipation  No A fib or A flutter  EMMI video to pt or via [place] 19 guidelines implemented in [place] today with Pt and RN  Pt is fully vaccinated  for Covid   Due to the COVID-19 pandemic we are asking patients to follow certain guidelines.  Pt aware of COVID protocols and [place] guidelines   Patient states he already has the prep at home.  This procedure has been rescheduled from a previous date.  Patient verbalizes understanding of the new instructions with new dates and times.

## 2021-01-26 ENCOUNTER — Encounter: Payer: Self-pay | Admitting: Gastroenterology

## 2021-01-27 ENCOUNTER — Other Ambulatory Visit: Payer: Self-pay

## 2021-01-27 ENCOUNTER — Encounter: Payer: Self-pay | Admitting: Gastroenterology

## 2021-01-27 ENCOUNTER — Other Ambulatory Visit (INDEPENDENT_AMBULATORY_CARE_PROVIDER_SITE_OTHER): Payer: BC Managed Care – PPO

## 2021-01-27 ENCOUNTER — Ambulatory Visit (AMBULATORY_SURGERY_CENTER): Payer: BC Managed Care – PPO | Admitting: Gastroenterology

## 2021-01-27 VITALS — BP 158/88 | HR 71 | Temp 96.6°F | Resp 12 | Ht 73.0 in | Wt 244.0 lb

## 2021-01-27 DIAGNOSIS — D509 Iron deficiency anemia, unspecified: Secondary | ICD-10-CM | POA: Diagnosis not present

## 2021-01-27 DIAGNOSIS — K6389 Other specified diseases of intestine: Secondary | ICD-10-CM

## 2021-01-27 DIAGNOSIS — D125 Benign neoplasm of sigmoid colon: Secondary | ICD-10-CM

## 2021-01-27 DIAGNOSIS — C182 Malignant neoplasm of ascending colon: Secondary | ICD-10-CM

## 2021-01-27 DIAGNOSIS — Z1211 Encounter for screening for malignant neoplasm of colon: Secondary | ICD-10-CM

## 2021-01-27 DIAGNOSIS — K635 Polyp of colon: Secondary | ICD-10-CM

## 2021-01-27 HISTORY — PX: COLONOSCOPY: SHX174

## 2021-01-27 LAB — CBC WITH DIFFERENTIAL/PLATELET
Basophils Absolute: 0.1 10*3/uL (ref 0.0–0.1)
Basophils Relative: 1.2 % (ref 0.0–3.0)
Eosinophils Absolute: 0.1 10*3/uL (ref 0.0–0.7)
Eosinophils Relative: 1.4 % (ref 0.0–5.0)
HCT: 34.2 % — ABNORMAL LOW (ref 39.0–52.0)
Hemoglobin: 11 g/dL — ABNORMAL LOW (ref 13.0–17.0)
Lymphocytes Relative: 31 % (ref 12.0–46.0)
Lymphs Abs: 2.6 10*3/uL (ref 0.7–4.0)
MCHC: 32.1 g/dL (ref 30.0–36.0)
MCV: 90.4 fl (ref 78.0–100.0)
Monocytes Absolute: 0.6 10*3/uL (ref 0.1–1.0)
Monocytes Relative: 7 % (ref 3.0–12.0)
Neutro Abs: 5 10*3/uL (ref 1.4–7.7)
Neutrophils Relative %: 59.4 % (ref 43.0–77.0)
Platelets: 263 10*3/uL (ref 150.0–400.0)
RBC: 3.79 Mil/uL — ABNORMAL LOW (ref 4.22–5.81)
RDW: 15.4 % (ref 11.5–15.5)
WBC: 8.4 10*3/uL (ref 4.0–10.5)

## 2021-01-27 LAB — COMPREHENSIVE METABOLIC PANEL
ALT: 38 U/L (ref 0–53)
AST: 37 U/L (ref 0–37)
Albumin: 3.9 g/dL (ref 3.5–5.2)
Alkaline Phosphatase: 75 U/L (ref 39–117)
BUN: 11 mg/dL (ref 6–23)
CO2: 28 mEq/L (ref 19–32)
Calcium: 8.8 mg/dL (ref 8.4–10.5)
Chloride: 100 mEq/L (ref 96–112)
Creatinine, Ser: 1.16 mg/dL (ref 0.40–1.50)
GFR: 67.88 mL/min (ref >60.00)
Glucose, Bld: 191 mg/dL — ABNORMAL HIGH (ref 70–99)
Potassium: 4.2 mEq/L (ref 3.5–5.1)
Sodium: 136 mEq/L (ref 135–145)
Total Bilirubin: 0.3 mg/dL (ref 0.2–1.2)
Total Protein: 7.5 g/dL (ref 6.0–8.3)

## 2021-01-27 LAB — FERRITIN: Ferritin: 20.2 ng/mL — ABNORMAL LOW (ref 22.0–322.0)

## 2021-01-27 LAB — IRON: Iron: 37 ug/dL — ABNORMAL LOW (ref 42–165)

## 2021-01-27 MED ORDER — SODIUM CHLORIDE 0.9 % IV SOLN: 500.0000 mL | Freq: Once | INTRAVENOUS | Status: DC

## 2021-01-27 NOTE — Progress Notes (Signed)
Called to room to assist during endoscopic procedure.  Patient ID and intended procedure confirmed with present staff. Received instructions for my participation in the procedure from the performing physician.  

## 2021-01-27 NOTE — Progress Notes (Signed)
Medical history reviewed with no changes noted, VS assessed by [facility]

## 2021-01-27 NOTE — Progress Notes (Signed)
Report to PACU, RN, vss, BBS= Clear.  

## 2021-01-27 NOTE — Patient Instructions (Signed)
Handouts provided on polyps and hemorrhoids.   Labs today in preparation for CT abdomen/pelvis with contrast. Contrast solution and instructions provided today.    YOU HAD AN ENDOSCOPIC PROCEDURE TODAY AT [facility]:   Refer to the procedure report that was given to you for any specific questions about what was found during the examination.  If the procedure report does not answer your questions, please call your gastroenterologist to clarify.  If you requested that your care partner not be given the details of your procedure findings, then the procedure report has been included in a sealed envelope for you to review at your convenience later.  YOU SHOULD EXPECT: Some feelings of bloating in the abdomen. Passage of more gas than usual.  Walking can help get rid of the air that was put into your GI tract during the procedure and reduce the bloating. If you had a lower endoscopy (such as a colonoscopy or flexible sigmoidoscopy) you may notice spotting of blood in your stool or on the toilet paper. If you underwent a bowel prep for your procedure, you may not have a normal bowel movement for a few days.  Please Note:  You might notice some irritation and congestion in your nose or some drainage.  This is from the oxygen used during your procedure.  There is no need for concern and it should clear up in a day or so.  SYMPTOMS TO REPORT IMMEDIATELY:   Following lower endoscopy (colonoscopy or flexible sigmoidoscopy):  Excessive amounts of blood in the stool  Significant tenderness or worsening of abdominal pains  Swelling of the abdomen that is new, acute  Fever of 100F or higher  For urgent or emergent issues, a gastroenterologist can be reached at any hour by calling [phone]. Do not use MyChart messaging for urgent concerns.    DIET:  We do recommend a small meal at first, but then you may proceed to your regular diet.  Drink plenty of fluids but you should avoid  alcoholic beverages for 24 hours.  ACTIVITY:  You should plan to take it easy for the rest of today and you should NOT DRIVE or use heavy machinery until tomorrow (because of the sedation medicines used during the test).    FOLLOW UP: Our staff will call the number listed on your records 48-72 hours following your procedure to check on you and address any questions or concerns that you may have regarding the information given to you following your procedure. If we do not reach you, we will leave a message.  We will attempt to reach you two times.  During this call, we will ask if you have developed any symptoms of COVID 19. If you develop any symptoms (ie: fever, flu-like symptoms, shortness of breath, cough etc.) before then, please call [phone].  If you test positive for Covid 19 in the 2 weeks post procedure, please call and report this information to [place].    If any biopsies were taken you will be contacted by phone or by letter within the next 1-3 weeks.  Please call us at [phone] if you have not heard about the biopsies in 3 weeks.    SIGNATURES/CONFIDENTIALITY: You and/or your care partner have signed paperwork which will be entered into your electronic medical record.  These signatures attest to the fact that that the information above on your After Visit Summary has been reviewed and is understood.  Full responsibility of the confidentiality of this discharge  information lies with you and/or your care-partner.

## 2021-01-27 NOTE — Op Note (Signed)
[place] Patient Name: Wyatt Meyer Procedure Date: 01/27/2021 3:06 PM MRN: [id number] Endoscopist: Thornton Park MD, MD Age: 60 Referring MD:  Date of Birth: 04/24/1961 Gender: Male Account #: [id number] Procedure:                Colonoscopy Indications:              Unexplained iron deficiency anemia                           Failed attempt at colonoscopy 11/01/20 due to poor                            prep                           No overt GI blood loss or GI symptoms                           No prior endoscopic evaluation                           No family history of colon cancer or polyps Medicines:                Monitored Anesthesia Care Procedure:                Pre-Anesthesia Assessment:                           - Prior to the procedure, a History and Physical                            was performed, and patient medications and                            allergies were reviewed. The patient's tolerance of                            previous anesthesia was also reviewed. The risks                            and benefits of the procedure and the sedation                            options and risks were discussed with the patient.                            All questions were answered, and informed consent                            was obtained. Prior Anticoagulants: The patient has                            taken no previous anticoagulant or antiplatelet                            agents.  ASA Grade Assessment: II - A patient with                            mild systemic disease. After reviewing the risks                            and benefits, the patient was deemed in                            satisfactory condition to undergo the procedure.                           After obtaining informed consent, the colonoscope                            was passed under direct vision. Throughout the                            procedure, the patient's blood  pressure, pulse, and                            oxygen saturations were monitored continuously. The                            Olympus CF-HQ190 [id number]) Colonoscope was                            introduced through the anus and advanced to the the                            cecum, identified by appendiceal orifice and                            ileocecal valve. The colonoscopy was technically                            difficult and complex due to a redundant colon,                            significant looping and a tortuous colon.                            Successful completion of the procedure was aided by                            changing the patient's position, withdrawing and                            reinserting the scope and applying abdominal                            pressure. The patient tolerated the procedure well.  The quality of the bowel preparation was good. The                            ileocecal valve, appendiceal orifice, and rectum                            were photographed. Scope In: 3:19:02 PM Scope Out: 3:47:07 PM Scope Withdrawal Time: 0 hours 18 minutes 45 seconds  Total Procedure Duration: 0 hours 28 minutes 5 seconds  Findings:                 Non-bleeding internal hemorrhoids were found.                           An ulcerated non-obstructing large mass on the fold                            of the ileocecal valve. Due to significant looping,                            I was unable to intubate the IC valve, but, the                            mass did not appear to involve the valve. Multiple                            biopsies were obtained with a cold forceps for                            histology. Area was tattooed with an injection of 3                            mL of Spot (carbon black).                           A 3 mm polyp was found in the distal sigmoid colon.                            The polyp was sessile. The  polyp was removed with a                            cold snare. Resection and retrieval were complete.                            Estimated blood loss was minimal.                           The exam was otherwise without abnormality on                            direct and retroflexion views. Complications:            No immediate complications. Estimated blood loss:  Minimal. Estimated Blood Loss:     Estimated blood loss was minimal. Impression:               - Non-bleeding internal hemorrhoids.                           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                            Biopsied. Tattooed.                           - One 3 mm polyp in the distal sigmoid colon,                            removed with a cold snare. Resected and retrieved.                           - The examination was otherwise normal on direct                            and retroflexion views. Recommendation:           - Patient has a contact number available for                            emergencies. The signs and symptoms of potential                            delayed complications were discussed with the                            patient. Return to normal activities tomorrow.                            Written discharge instructions were provided to the                            patient.                           - Resume previous diet.                           - Continue present medications.                           - Await pathology results.                           - Labs today in preparation for CT abd/pelvis with                            contrast.                           - Surgical referral for resection.                           -  Given these results, all first degree relatives                            (brothers, sisters, children, parents) should start                            colon cancer screening at age 22. Thornton Park MD,  MD 01/27/2021 4:01:36 PM This report has been signed electronically.

## 2021-01-28 LAB — CEA: CEA: 6 ng/mL — ABNORMAL HIGH

## 2021-01-30 ENCOUNTER — Telehealth: Payer: Self-pay | Admitting: Gastroenterology

## 2021-01-30 ENCOUNTER — Telehealth: Payer: Self-pay

## 2021-01-30 NOTE — Telephone Encounter (Addendum)
Called patient to review path results after receiving a page from Dr. Melina Copa in Pathology with the results. I left a voicemail at [phone] asking Mr. Mercado to return my call.   I also updated Dr. Dema Severin who will see him in surgical consultation on the path results.     Called again at 13:25. I went directly to voicemail. No additional message left.   Addendum: I called again 01/31/21 at 1:30. I went to voicemail. I left a message asking him to return my call and left our office number.

## 2021-01-30 NOTE — Telephone Encounter (Signed)
Lab orders in epic. Pt scheduled for CT of A/P at [facility] 5/13@8am . Pt to arrive there at 7:45am, drink bottle 1 of contrast at 6am and bottle 2 at 7am. Referral has been sent to [facility] for appt with Dr. Dema Severin.

## 2021-01-30 NOTE — Telephone Encounter (Signed)
-----   Message from Thornton Park, MD sent at 01/27/2021  3:49 PM EDT ----- New diagnosis of colon mass Labs: CEA, CMP CT abd/pelvis with contrast Referral to Dr. Nadeen Landau   Thank you.

## 2021-01-31 ENCOUNTER — Other Ambulatory Visit: Payer: Self-pay

## 2021-01-31 ENCOUNTER — Telehealth: Payer: Self-pay | Admitting: *Deleted

## 2021-01-31 DIAGNOSIS — D509 Iron deficiency anemia, unspecified: Secondary | ICD-10-CM

## 2021-01-31 NOTE — Telephone Encounter (Signed)
  Follow up Call-  Call back number 01/27/2021 11/01/2020  Post procedure Call Back phone  # [phone]  Permission to leave phone message Yes Yes  Some recent data might be hidden     Patient questions:  Do you have a fever, pain , or abdominal swelling? No. Pain Score  0 *  Have you tolerated food without any problems? Yes.    Have you been able to return to your normal activities? Yes.    Do you have any questions about your discharge instructions: Diet   No. Medications  No. Follow up visit  No.  Do you have questions or concerns about your Care? No.  Actions: * If pain score is 4 or above: No action needed, pain <4.  1. Have you developed a fever since your procedure? no  2.   Have you had an respiratory symptoms (SOB or cough) since your procedure? no  3.   Have you tested positive for COVID 19 since your procedure no  4.   Have you had any family members/close contacts diagnosed with the COVID 19 since your procedure?  no   If yes to any of these questions please route to Joylene John, RN and Joella Prince, RN

## 2021-02-01 ENCOUNTER — Telehealth: Payer: Self-pay | Admitting: Hematology

## 2021-02-01 NOTE — Telephone Encounter (Signed)
Results reviewed by phone. He is aware of plans for hematology for IV iron, CT scan 02/03/21 and consultation with Dr. Dema Severin 02/06/21.

## 2021-02-01 NOTE — Telephone Encounter (Signed)
Received a new hem referral from Dr. Tarri Glenn for IDA. Mr. Muzyka has been cld and scheduled to see Dr. Irene Limbo on 5/12 at 11am. Pt aware to arrive 20 minutes early.

## 2021-02-01 NOTE — Progress Notes (Incomplete)
HEMATOLOGY/ONCOLOGY CONSULTATION NOTE  Date of Service: 02/01/2021  Patient Care Team: Vivi Barrack, MD as PCP - General (Family Medicine)  CHIEF COMPLAINTS/PURPOSE OF CONSULTATION:  IDA  HISTORY OF PRESENTING ILLNESS:  Wyatt Meyer is a wonderful 60 y.o. male who has been referred to [place] by Dr. Tarri Glenn  for evaluation and management of IDA. The pt reports that he is doing well overall.  The pt reports ***  Lab results *** of CBC w/diff and CMP is as follows: all values are WNL except for ***  On review of systems, pt reports *** and denies *** and any other symptoms.   MEDICAL HISTORY:  Past Medical History:  Diagnosis Date  . Anemia    on meds  . Erectile dysfunction following radical prostatectomy 10/23/2016  . Hyperlipidemia    on meds  . Iron deficiency anemia    on IRON and Geritol  . Kidney stone   . Klebsiella sepsis ([place]) 03/25/2015  . Osteoarthritis 08/11/2012  . Peripheral neuropathy 08/11/2012  . Prostate cancer ([place])   . Smoker   . SUI (stress urinary incontinence), male 02/03/2013  . 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 PROSTATE SURGERY  2013    SOCIAL HISTORY: Social History   Socioeconomic History  . Marital status: Married    Spouse name: Not on file  . Number of children: 3  . Years of education: Not on file  . Highest education level: Not on file  Occupational History  . Occupation: Data processing manager  Tobacco Use  . Smoking status: Current Every Day Smoker    Packs/day: 0.50    Types: Cigarettes  . Smokeless tobacco: Never Used  . Tobacco comment: tobacco info given 09/27/2020  Vaping Use  . Vaping Use: Never used  Substance and Sexual Activity  . Alcohol use: Yes    Comment: 1-2 beers per month  . Drug use: No  . Sexual activity: Yes  Other Topics Concern  . Not on file  Social History Narrative  . Not on  file   Social Determinants of Health   Financial Resource Strain: Not on file  Food Insecurity: Not on file  Transportation Needs: Not on file  Physical Activity: Not on file  Stress: Not on file  Social Connections: Not on file  Intimate Partner Violence: Not on file    FAMILY HISTORY: Family History  Problem Relation Age of Onset  . Hyperlipidemia Mother   . Diabetes Mother   . Heart disease Father   . Hyperlipidemia Sister   . Thyroid cancer Sister   . Thyroid cancer Sister   . Esophageal cancer Neg Hx   . Rectal cancer Neg Hx   . Colon cancer Neg Hx   . Stomach cancer Neg Hx   . Colon polyps Neg Hx     ALLERGIES:  is allergic to codeine.  MEDICATIONS:  Current Outpatient Medications  Medication Sig Dispense Refill  . Ascorbic Acid (VITAMIN C PO) Take 1 tablet by mouth daily.    . Ascorbic Acid (VITAMIN C) 100 MG tablet Take 1 tablet (100 mg total) by mouth every other day. Take WITH Ferrous Sulfate    . atorvastatin (LIPITOR) 10 MG tablet Take 1 tablet (10 mg total) by mouth daily. 90 tablet 3  . Continuous Blood Gluc Receiver (FREESTYLE LIBRE 14 DAY READER) DEVI 1 Device by Does not apply  route daily. Use to check blood sugars daily 1 Device 0  . Continuous Blood Gluc Sensor (FREESTYLE LIBRE 14 DAY SENSOR) MISC USE AS DIRECTED EVERY 14 DAYS 2 each 4  . insulin glargine (LANTUS SOLOSTAR) 100 UNIT/ML Solostar Pen Inject 40 Units into the skin at bedtime. 15 mL 5  . insulin lispro (HUMALOG KWIKPEN) 100 UNIT/ML KwikPen Inject under skin 18-22 units before each meal - 3x a day 20 pen 0  . Insulin Pen Needle (BD PEN NEEDLE NANO U/F) 32G X 4 MM MISC use four times a day 400 each 3  . Iron, Ferrous Sulfate, 325 (65 Fe) MG TABS Take 1 tablet by mouth every other day. Take WITH Vitamin C 15 tablet 0  . Iron-Vitamins (GERITOL PO) Take 1 tablet by mouth daily.    . metFORMIN (GLUCOPHAGE-XR) 500 MG 24 hr tablet Take 2 tablets (1,000 mg total) by mouth 2 (two) times daily with a  meal. 360 tablet 3  . ONE TOUCH LANCETS MISC Use 3x a day 200 each 11  . VITAMIN D PO Take 1 tablet by mouth daily.     Current Facility-Administered Medications  Medication Dose Route Frequency Provider Last Rate Last Admin  . 0.9 %  sodium chloride infusion  500 mL Intravenous Once Thornton Park, MD        REVIEW OF SYSTEMS:   10 Point review of Systems was done is negative except as noted above.  PHYSICAL EXAMINATION: ECOG PERFORMANCE STATUS: {CHL ONC ECOG FA:[id number]}  .There were no vitals filed for this visit. There were no vitals filed for this visit. .There is no height or weight on file to calculate BMI.  *** GENERAL:alert, in no acute distress and comfortable SKIN: no acute rashes, no significant lesions EYES: conjunctiva are pink and non-injected, sclera anicteric OROPHARYNX: MMM, no exudates, no oropharyngeal erythema or ulceration NECK: supple, no JVD LYMPH:  no palpable lymphadenopathy in the cervical, axillary or inguinal regions LUNGS: clear to auscultation b/l with normal respiratory effort HEART: regular rate & rhythm ABDOMEN:  normoactive bowel sounds , non tender, not distended. Extremity: no pedal edema PSYCH: alert & oriented x 3 with fluent speech NEURO: no focal motor/sensory deficits  LABORATORY DATA:  I have reviewed the data as listed  . CBC Latest Ref Rng & Units 01/27/2021 07/19/2020 07/12/2020  WBC 4.0 - 10.5 K/uL 8.4 7.0 7.7  Hemoglobin 13.0 - 17.0 g/dL 11.0(L) 10.4(L) 10.5(L)  Hematocrit 39.0 - 52.0 % 34.2(L) 33.2(L) 32.3(L)  Platelets 150.0 - 400.0 K/uL 263.0 298 279    . CMP Latest Ref Rng & Units 01/27/2021 07/12/2020 04/19/2020  Glucose 70 - 99 mg/dL 191(H) 338(H) 366(H)  BUN 6 - 23 mg/dL 11 19 16   Creatinine 0.40 - 1.50 mg/dL 1.16 1.32(H) 1.33(H)  Sodium 135 - 145 mEq/L 136 137 137  Potassium 3.5 - 5.1 mEq/L 4.2 4.8 4.5  Chloride 96 - 112 mEq/L 100 103 102  CO2 19 - 32 mEq/L 28 27 26   Calcium 8.4 - 10.5 mg/dL 8.8 9.1 9.3   Total Protein 6.0 - 8.3 g/dL 7.5 6.7 6.9  Total Bilirubin 0.2 - 1.2 mg/dL 0.3 0.3 0.4  Alkaline Phos 39 - 117 U/L 75 - -  AST 0 - 37 U/L 37 31 36(H)  ALT 0 - 53 U/L 38 37 56(H)     RADIOGRAPHIC STUDIES: I have personally reviewed the radiological images as listed and agreed with the findings in the report. No results found.  ASSESSMENT & PLAN:  PLAN: -***   -Will see back ***    FOLLOW UP: ***   All of the patients questions were answered with apparent satisfaction. The patient knows to call the clinic with any problems, questions or concerns.  I spent {CHL ONC TIME VISIT - RUEAV:[id number]} counseling the patient face to face. The total time spent in the appointment was {CHL ONC TIME VISIT - WGNFA:[id number]} and more than 50% was on counseling and direct patient cares.    Sullivan Lone MD [place] AAHIVMS [facility] Hematology/Oncology Physician [facility]  (Office):       [phone] (Work cell):  [phone] (Fax):           [phone]  02/01/2021 8:35 PM   I, Reinaldo Raddle, am acting as scribe for Dr. Sullivan Lone, MD.

## 2021-02-02 ENCOUNTER — Encounter: Payer: BC Managed Care – PPO | Admitting: Hematology

## 2021-02-02 NOTE — Progress Notes (Signed)
I spoke with patient regarding referral from Dr. Tarri Glenn with newly diagnosed colon cancer.  He was originally scheduled to see Dr. Irene Limbo today however per Dr. Benay Spice okay to cancel this appointment and reschedule with him 2 to 3 weeks post colon surgery.  I explained this to the patient and he is in agreement.  He sees Dr. Dema Severin on 5/16.  I will monitor his surgery date and schedule him to be seen with Dr. Benay Spice in medical oncology a few weeks after surgery.  The patient verbalized an understanding and appreciated the call.

## 2021-02-03 ENCOUNTER — Other Ambulatory Visit: Payer: Self-pay

## 2021-02-03 ENCOUNTER — Ambulatory Visit (HOSPITAL_COMMUNITY)
Admission: RE | Admit: 2021-02-03 | Discharge: 2021-02-03 | Disposition: A | Discharge status: Home / Self Care | Payer: BC Managed Care – PPO | Source: Ambulatory Visit | Attending: Gastroenterology | Admitting: Gastroenterology

## 2021-02-03 ENCOUNTER — Encounter (HOSPITAL_COMMUNITY): Payer: Self-pay

## 2021-02-03 DIAGNOSIS — K6389 Other specified diseases of intestine: Secondary | ICD-10-CM | POA: Diagnosis not present

## 2021-02-03 MED ORDER — IOHEXOL 300 MG/ML  SOLN
100.0000 mL | Freq: Once | INTRAMUSCULAR | Status: AC | PRN
  Administered 2021-02-03: 100 mL via INTRAVENOUS

## 2021-02-03 MED ORDER — SODIUM CHLORIDE (PF) 0.9 % IJ SOLN
INTRAMUSCULAR | Status: AC
  Filled 2021-02-03: qty 50

## 2021-02-07 ENCOUNTER — Other Ambulatory Visit (HOSPITAL_COMMUNITY): Payer: Self-pay | Admitting: Surgery

## 2021-02-07 DIAGNOSIS — C189 Malignant neoplasm of colon, unspecified: Secondary | ICD-10-CM

## 2021-02-14 ENCOUNTER — Ambulatory Visit (HOSPITAL_COMMUNITY)
Admission: RE | Admit: 2021-02-14 | Discharge: 2021-02-14 | Disposition: A | Discharge status: Home / Self Care | Payer: BC Managed Care – PPO | Source: Ambulatory Visit | Attending: Surgery | Admitting: Surgery

## 2021-02-14 ENCOUNTER — Other Ambulatory Visit: Payer: Self-pay

## 2021-02-14 DIAGNOSIS — C189 Malignant neoplasm of colon, unspecified: Secondary | ICD-10-CM | POA: Diagnosis present

## 2021-02-16 NOTE — Progress Notes (Signed)
Spoke with patient regarding appointment with Dr. Benay Spice (medical oncologist) two weeks after his scheduled surgery on 03/22/2021.  He has agreed to Thursday 04/06/2021 at 1:40.

## 2021-03-09 NOTE — Progress Notes (Signed)
Pt. Needs orders for upcomming surgery.PAT and labs on 03/13/21.

## 2021-03-09 NOTE — Patient Instructions (Addendum)
DUE TO COVID-19 ONLY ONE VISITOR IS ALLOWED TO COME WITH YOU AND STAY IN THE WAITING ROOM ONLY DURING PRE OP AND PROCEDURE DAY OF SURGERY. THE 1 VISITOR  MAY VISIT WITH YOU AFTER SURGERY IN YOUR PRIVATE ROOM DURING VISITING HOURS ONLY!  YOU NEED TO HAVE A COVID 19 TEST ON: 03/20/21@ 8:30 AM , THIS TEST MUST BE DONE BEFORE SURGERY,  COVID TESTING SITE [address] [place] [id number], IT IS ON THE RIGHT GOING OUT [address] APPROXIMATELY  2 MINUTES PAST [employer] ON THE RIGHT. ONCE YOUR COVID TEST IS COMPLETED,  PLEASE BEGIN THE QUARANTINE INSTRUCTIONS AS OUTLINED IN YOUR HANDOUT.                Wyatt Meyer   Your procedure is scheduled on: 03/22/21   Report to [facility] Main  Entrance   Report to admitting at: 9:30 AM     Call this number if you have problems the morning of surgery [phone]    Remember: DRINK 2 PRESURGERY ENSURE DRINKS THE NIGHT BEFORE SURGERY AT  1000 PM AND 1 PRESURGERY DRINK THE DAY OF THE PROCEDURE 3 HOURS PRIOR TO SCHEDULED SURGERY. NO SOLIDS AFTER MIDNIGHT THE DAY PRIOR TO THE SURGERY. NOTHING BY MOUTH EXCEPT CLEAR LIQUIDS UNTIL THREE HOURS PRIOR TO SCHEDULED SURGERY. PLEASE FINISH PRESURGERY ENSURE DRINK PER SURGEON ORDER 3 HOURS PRIOR TO SCHEDULED SURGERY TIME WHICH NEEDS TO BE COMPLETED AT: 8:30 AM.   CLEAR LIQUID DIET  Foods Allowed                                                                     Foods Excluded  Coffee and tea, regular and decaf                             liquids that you cannot  Plain Jell-O any favor except red or purple                                           see through such as: Fruit ices (not with fruit pulp)                                     milk, soups, orange juice  Iced Popsicles                                    All solid food Carbonated beverages, regular and diet                                    Cranberry, grape and apple juices Sports drinks like Gatorade Lightly seasoned  clear broth or consume(fat free) Sugar, honey syrup  Sample Menu Breakfast  Lunch                                     Supper Cranberry juice                    Beef broth                            Chicken broth Jell-O                                     Grape juice                           Apple juice Coffee or tea                        Jell-O                                      Popsicle                                                Coffee or tea                        Coffee or tea  _____________________________________________________________________   BRUSH YOUR TEETH MORNING OF SURGERY AND RINSE YOUR MOUTH OUT, NO CHEWING GUM CANDY OR MINTS.   How to Manage Your Diabetes Before and After Surgery  Why is it important to control my blood sugar before and after surgery? Improving blood sugar levels before and after surgery helps healing and can limit problems. A way of improving blood sugar control is eating a healthy diet by:  Eating less sugar and carbohydrates  Increasing activity/exercise  Talking with your doctor about reaching your blood sugar goals High blood sugars (greater than 180 mg/dL) can raise your risk of infections and slow your recovery, so you will need to focus on controlling your diabetes during the weeks before surgery. Make sure that the doctor who takes care of your diabetes knows about your planned surgery including the date and location.  How do I manage my blood sugar before surgery? Check your blood sugar at least 4 times a day, starting 2 days before surgery, to make sure that the level is not too high or low. Check your blood sugar the morning of your surgery when you wake up and every 2 hours until you get to the Short Stay unit. If your blood sugar is less than 70 mg/dL, you will need to treat for low blood sugar: Do not take insulin. Treat a low blood sugar (less than 70 mg/dL) with  cup of clear juice (cranberry or  apple), 4 glucose tablets, OR glucose gel. Recheck blood sugar in 15 minutes after treatment (to make sure it is greater than 70 mg/dL). If your blood sugar is not greater than 70 mg/dL on recheck, call [phone] for further instructions. Report your blood sugar to the short stay nurse when you get to  Short Stay.  If you are admitted to the hospital after surgery: Your blood sugar will be checked by the staff and you will probably be given insulin after surgery (instead of oral diabetes medicines) to make sure you have good blood sugar levels. The goal for blood sugar control after surgery is 80-180 mg/dL.   WHAT DO I DO ABOUT MY DIABETES MEDICATION?  Do not take oral diabetes medicines (pills) the morning of surgery.  THE DAY BEFORE SURGERY, take Metformin as usual. DO NOT take bedtime dose of Novolog.       THE MORNING OF SURGERY,         If your CBG is greater than 220 mg/dL, you may take  of your sliding scale  (correction) dose of insulin.    DO NOT TAKE ANY ORAL DIABETIC MEDICATIONS DAY OF YOUR SURGERY                               You may not have any metal on your body including hair pins and              piercings  Do not wear jewelry,lotions, powders or perfumes, deodorant             Men may shave face and neck.   Do not bring valuables to the hospital. [place].  Contacts, dentures or bridgework may not be worn into surgery.  Leave suitcase in the car. After surgery it may be brought to your room.     Patients discharged the day of surgery will not be allowed to drive home. IF YOU ARE HAVING SURGERY AND GOING HOME THE SAME DAY, YOU MUST HAVE AN ADULT TO DRIVE YOU HOME AND BE WITH YOU FOR 24 HOURS. YOU MAY GO HOME BY TAXI OR UBER OR ORTHERWISE, BUT AN ADULT MUST ACCOMPANY YOU HOME AND STAY WITH YOU FOR 24 HOURS.  Name and phone number of your driver:  Special Instructions: N/A              Please read over the  following fact sheets you were given: _____________________________________________________________________           [facility] - Preparing for Surgery Before surgery, you can play an important role.  Because skin is not sterile, your skin needs to be as free of germs as possible.  You can reduce the number of germs on your skin by washing with CHG (chlorahexidine gluconate) soap before surgery.  CHG is an antiseptic cleaner which kills germs and bonds with the skin to continue killing germs even after washing. Please DO NOT use if you have an allergy to CHG or antibacterial soaps.  If your skin becomes reddened/irritated stop using the CHG and inform your nurse when you arrive at Short Stay. Do not shave (including legs and underarms) for at least 48 hours prior to the first CHG shower.  You may shave your face/neck. Please follow these instructions carefully:  1.  Shower with CHG Soap the night before surgery and the  morning of Surgery.  2.  If you choose to wash your hair, wash your hair first as usual with your  normal  shampoo.  3.  After you shampoo, rinse your hair and body thoroughly to remove the  shampoo.  4.  Use CHG as you would any other liquid soap.  You can apply chg directly  to the skin and wash                       Gently with a scrungie or clean washcloth.  5.  Apply the CHG Soap to your body ONLY FROM THE NECK DOWN.   Do not use on face/ open                           Wound or open sores. Avoid contact with eyes, ears mouth and genitals (private parts).                       Wash face,  Genitals (private parts) with your normal soap.             6.  Wash thoroughly, paying special attention to the area where your surgery  will be performed.  7.  Thoroughly rinse your body with warm water from the neck down.  8.  DO NOT shower/wash with your normal soap after using and rinsing off  the CHG Soap.                9.  Pat yourself dry with a clean  towel.            10.  Wear clean pajamas.            11.  Place clean sheets on your bed the night of your first shower and do not  sleep with pets. Day of Surgery : Do not apply any lotions/deodorants the morning of surgery.  Please wear clean clothes to the hospital/surgery center.  FAILURE TO FOLLOW THESE INSTRUCTIONS MAY RESULT IN THE CANCELLATION OF YOUR SURGERY PATIENT SIGNATURE_________________________________  NURSE SIGNATURE__________________________________  ________________________________________________________________________

## 2021-03-13 ENCOUNTER — Encounter (HOSPITAL_COMMUNITY)
Admission: RE | Admit: 2021-03-13 | Discharge: 2021-03-13 | Disposition: A | Discharge status: Home / Self Care | Payer: BC Managed Care – PPO | Source: Ambulatory Visit | Attending: Surgery | Admitting: Surgery

## 2021-03-13 ENCOUNTER — Ambulatory Visit: Payer: Self-pay | Admitting: Surgery

## 2021-03-13 ENCOUNTER — Encounter (HOSPITAL_COMMUNITY): Payer: Self-pay | Admitting: Physician Assistant

## 2021-03-13 ENCOUNTER — Other Ambulatory Visit: Payer: Self-pay

## 2021-03-13 ENCOUNTER — Encounter (HOSPITAL_COMMUNITY): Payer: Self-pay

## 2021-03-13 DIAGNOSIS — Z01818 Encounter for other preprocedural examination: Secondary | ICD-10-CM | POA: Insufficient documentation

## 2021-03-13 LAB — COMPREHENSIVE METABOLIC PANEL
ALT: 29 U/L (ref 0–44)
AST: 29 U/L (ref 15–41)
Albumin: 3.8 g/dL (ref 3.5–5.0)
Alkaline Phosphatase: 115 U/L (ref 38–126)
Anion gap: 6 (ref 5–15)
BUN: 17 mg/dL (ref 8–23)
CO2: 27 mmol/L (ref 22–32)
Calcium: 9.1 mg/dL (ref 8.9–10.3)
Chloride: 105 mmol/L (ref 98–111)
Creatinine, Ser: 1.34 mg/dL — ABNORMAL HIGH (ref 0.61–1.24)
GFR, Estimated: >60 mL/min (ref >60)
Glucose, Bld: 329 mg/dL — ABNORMAL HIGH (ref 70–99)
Potassium: 4.6 mmol/L (ref 3.5–5.1)
Sodium: 138 mmol/L (ref 135–145)
Total Bilirubin: 0.3 mg/dL (ref 0.3–1.2)
Total Protein: 7.6 g/dL (ref 6.5–8.1)

## 2021-03-13 LAB — CBC WITH DIFFERENTIAL/PLATELET
Abs Immature Granulocytes: 0.02 10*3/uL (ref 0.00–0.07)
Basophils Absolute: 0 10*3/uL (ref 0.0–0.1)
Basophils Relative: 0 %
Eosinophils Absolute: 0.1 10*3/uL (ref 0.0–0.5)
Eosinophils Relative: 1 %
HCT: 35.6 % — ABNORMAL LOW (ref 39.0–52.0)
Hemoglobin: 10.6 g/dL — ABNORMAL LOW (ref 13.0–17.0)
Immature Granulocytes: 0 %
Lymphocytes Relative: 24 %
Lymphs Abs: 1.5 10*3/uL (ref 0.7–4.0)
MCH: 28.8 pg (ref 26.0–34.0)
MCHC: 29.8 g/dL — ABNORMAL LOW (ref 30.0–36.0)
MCV: 96.7 fL (ref 80.0–100.0)
Monocytes Absolute: 0.7 10*3/uL (ref 0.1–1.0)
Monocytes Relative: 11 %
Neutro Abs: 4 10*3/uL (ref 1.7–7.7)
Neutrophils Relative %: 64 %
Platelets: 297 10*3/uL (ref 150–400)
RBC: 3.68 MIL/uL — ABNORMAL LOW (ref 4.22–5.81)
RDW: 13.6 % (ref 11.5–15.5)
WBC: 6.2 10*3/uL (ref 4.0–10.5)
nRBC: 0 % (ref 0.0–0.2)

## 2021-03-13 LAB — GLUCOSE, CAPILLARY: Glucose-Capillary: 325 mg/dL — ABNORMAL HIGH (ref 70–99)

## 2021-03-13 NOTE — Progress Notes (Addendum)
COVID Vaccine Completed: Yes Date COVID Vaccine completed: 12/15/19 COVID vaccine manufacturer:    Moderna     PCP - Dr. Dimas Chyle. Cardiologist -   Chest x-ray -  EKG -  Stress Test -  ECHO - 07/29/17 Cardiac Cath -  Pacemaker/ICD device last checked:  Sleep Study -  CPAP -   Fasting Blood Sugar - 200's  Checks Blood Sugar ___2__ times a day  Blood Thinner Instructions: Aspirin Instructions: Last Dose:  Anesthesia review: Hx: DIA,smoker  Patient denies shortness of breath, fever, cough and chest pain at PAT appointment   Patient verbalized understanding of instructions that were given to them at the PAT appointment. Patient was also instructed that they will need to review over the PAT instructions again at home before surgery.

## 2021-03-14 LAB — HEMOGLOBIN A1C
Hgb A1c MFr Bld: 12.2 % — ABNORMAL HIGH (ref 4.8–5.6)
Mean Plasma Glucose: 303 mg/dL

## 2021-03-14 NOTE — Progress Notes (Signed)
Lab. Results A1C: 12.2 CBG: 325.

## 2021-03-20 ENCOUNTER — Other Ambulatory Visit (HOSPITAL_COMMUNITY)
Admission: RE | Admit: 2021-03-20 | Discharge: 2021-03-20 | Disposition: A | Discharge status: Home / Self Care | Payer: BC Managed Care – PPO | Source: Ambulatory Visit | Attending: Surgery | Admitting: Surgery

## 2021-03-20 DIAGNOSIS — Z20822 Contact with and (suspected) exposure to covid-19: Secondary | ICD-10-CM | POA: Diagnosis not present

## 2021-03-20 DIAGNOSIS — Z01812 Encounter for preprocedural laboratory examination: Secondary | ICD-10-CM | POA: Insufficient documentation

## 2021-03-20 LAB — SARS CORONAVIRUS 2 (TAT 6-24 HRS): SARS Coronavirus 2: NEGATIVE

## 2021-03-21 MED ORDER — BUPIVACAINE LIPOSOME 1.3 % IJ SUSP
20.0000 mL | Freq: Once | INTRAMUSCULAR | Status: DC
  Filled 2021-03-21: qty 20

## 2021-03-21 MED ORDER — SODIUM CHLORIDE 0.9 % IV SOLN
2.0000 g | INTRAVENOUS | Status: DC
  Filled 2021-03-21: qty 2

## 2021-03-22 ENCOUNTER — Encounter (HOSPITAL_COMMUNITY): Payer: Self-pay | Admitting: Surgery

## 2021-03-22 ENCOUNTER — Ambulatory Visit (HOSPITAL_COMMUNITY)
Admission: RE | Admit: 2021-03-22 | Discharge: 2021-03-22 | Disposition: A | Discharge status: Home / Self Care | Payer: BC Managed Care – PPO | Attending: Surgery | Admitting: Surgery

## 2021-03-22 ENCOUNTER — Telehealth: Payer: Self-pay

## 2021-03-22 ENCOUNTER — Encounter (HOSPITAL_COMMUNITY)
Admission: RE | Disposition: A | Discharge status: Home / Self Care | Payer: Self-pay | Source: Home / Self Care | Attending: Surgery

## 2021-03-22 DIAGNOSIS — Z538 Procedure and treatment not carried out for other reasons: Secondary | ICD-10-CM | POA: Insufficient documentation

## 2021-03-22 DIAGNOSIS — Z01818 Encounter for other preprocedural examination: Secondary | ICD-10-CM | POA: Diagnosis not present

## 2021-03-22 LAB — ABO/RH: ABO/RH(D): B NEG

## 2021-03-22 LAB — TYPE AND SCREEN
ABO/RH(D): B NEG
Antibody Screen: NEGATIVE

## 2021-03-22 LAB — GLUCOSE, CAPILLARY: Glucose-Capillary: 183 mg/dL — ABNORMAL HIGH (ref 70–99)

## 2021-03-22 SURGERY — LAPAROSCOPIC RIGHT HEMI COLECTOMY
Anesthesia: General | Laterality: Right

## 2021-03-22 MED ORDER — ONDANSETRON HCL 4 MG/2ML IJ SOLN
INTRAMUSCULAR | Status: AC
  Filled 2021-03-22: qty 2

## 2021-03-22 MED ORDER — ORAL CARE MOUTH RINSE: 15.0000 mL | Freq: Once | OROMUCOSAL | Status: AC

## 2021-03-22 MED ORDER — LIDOCAINE 2% (20 MG/ML) 5 ML SYRINGE
INTRAMUSCULAR | Status: AC
  Filled 2021-03-22: qty 5

## 2021-03-22 MED ORDER — CHLORHEXIDINE GLUCONATE CLOTH 2 % EX PADS: 6.0000 | MEDICATED_PAD | Freq: Once | CUTANEOUS | Status: DC

## 2021-03-22 MED ORDER — POLYETHYLENE GLYCOL 3350 17 GM/SCOOP PO POWD: 1.0000 | Freq: Once | ORAL | Status: DC

## 2021-03-22 MED ORDER — BISACODYL 5 MG PO TBEC: 20.0000 mg | DELAYED_RELEASE_TABLET | Freq: Once | ORAL | Status: DC

## 2021-03-22 MED ORDER — METRONIDAZOLE 500 MG PO TABS
1000.0000 mg | ORAL_TABLET | ORAL | Status: DC
  Filled 2021-03-22: qty 2

## 2021-03-22 MED ORDER — ACETAMINOPHEN 500 MG PO TABS
1000.0000 mg | ORAL_TABLET | ORAL | Status: AC
  Administered 2021-03-22: 1000 mg via ORAL
  Filled 2021-03-22: qty 2

## 2021-03-22 MED ORDER — ENSURE PRE-SURGERY PO LIQD
296.0000 mL | Freq: Once | ORAL | Status: DC
  Filled 2021-03-22: qty 296

## 2021-03-22 MED ORDER — CHLORHEXIDINE GLUCONATE 0.12 % MT SOLN
15.0000 mL | Freq: Once | OROMUCOSAL | Status: AC
  Administered 2021-03-22: 15 mL via OROMUCOSAL

## 2021-03-22 MED ORDER — DEXAMETHASONE SODIUM PHOSPHATE 10 MG/ML IJ SOLN
INTRAMUSCULAR | Status: AC
  Filled 2021-03-22: qty 1

## 2021-03-22 MED ORDER — ENSURE PRE-SURGERY PO LIQD
592.0000 mL | Freq: Once | ORAL | Status: DC
  Filled 2021-03-22: qty 592

## 2021-03-22 MED ORDER — LACTATED RINGERS IV SOLN: INTRAVENOUS | Status: DC

## 2021-03-22 MED ORDER — NEOMYCIN SULFATE 500 MG PO TABS
1000.0000 mg | ORAL_TABLET | ORAL | Status: DC
  Filled 2021-03-22: qty 2

## 2021-03-22 MED ORDER — HEPARIN SODIUM (PORCINE) 5000 UNIT/ML IJ SOLN
5000.0000 [IU] | Freq: Once | INTRAMUSCULAR | Status: AC
  Administered 2021-03-22: 5000 [IU] via SUBCUTANEOUS
  Filled 2021-03-22: qty 1

## 2021-03-22 MED ORDER — FENTANYL CITRATE (PF) 250 MCG/5ML IJ SOLN
INTRAMUSCULAR | Status: AC
  Filled 2021-03-22: qty 5

## 2021-03-22 MED ORDER — ALVIMOPAN 12 MG PO CAPS
12.0000 mg | ORAL_CAPSULE | ORAL | Status: AC
  Administered 2021-03-22: 12 mg via ORAL
  Filled 2021-03-22: qty 1

## 2021-03-22 MED ORDER — KETAMINE HCL 10 MG/ML IJ SOLN
INTRAMUSCULAR | Status: AC
  Filled 2021-03-22: qty 1

## 2021-03-22 MED ORDER — MIDAZOLAM HCL 2 MG/2ML IJ SOLN
INTRAMUSCULAR | Status: AC
  Filled 2021-03-22: qty 2

## 2021-03-22 NOTE — Telephone Encounter (Signed)
Sent to scheduling

## 2021-03-22 NOTE — Progress Notes (Signed)
Patient was seen and evaluated in preop this morning. His preop A1c has worsened now 12.2 and additionally did not take bowel prep. I went and discussed everything with him. We have reviewed the higher risk of perioperative complications in this setting including cardiovascular, infectious, among others. We discussed postponing his procedure for ~6 weeks and allow time for better glycemic control. He is in agreement with this plan.  I have discussed this with his PCP Dr. Jerline Pain this morning as well, they are getting him in for follow-up.  Nadeen Landau, MD [facility] Use [email or web address] to contact on call provider

## 2021-03-22 NOTE — Anesthesia Preprocedure Evaluation (Deleted)
Anesthesia Evaluation  Patient identified by MRN, date of birth, ID band Patient awake    Reviewed: Allergy & Precautions, NPO status , Patient's Chart, lab work & pertinent test results  Airway Mallampati: II  TM Distance: >3 FB Neck ROM: Full    Dental  (+) Dental Advisory Given,    Pulmonary neg pulmonary ROS, Current Smoker and Patient abstained from smoking.,    Pulmonary exam normal breath sounds clear to auscultation       Cardiovascular negative cardio ROS Normal cardiovascular exam Rhythm:Regular Rate:Normal  Echo 2018 - Left ventricle: The cavity size was normal. Systolic function was normal. The estimated ejection fraction was in the range of 55% to 60%. Wall motion was normal; there were no regional wall motion abnormalities. There was an increased relative contribution of atrial contraction to ventricular filling. Doppler parameters are consistent with abnormal left ventricular relaxation (grade 1 diastolic dysfunction).  - Tricuspid valve: There was trivial regurgitation.    Neuro/Psych negative neurological ROS     GI/Hepatic negative GI ROS, Neg liver ROS,   Endo/Other  diabetes, Poorly Controlled  Renal/GU Renal disease     Musculoskeletal  (+) Arthritis ,   Abdominal   Peds  Hematology  (+) Blood dyscrasia, anemia ,   Anesthesia Other Findings   Reproductive/Obstetrics                            Anesthesia Physical Anesthesia Plan  ASA: 3  Anesthesia Plan: General   Post-op Pain Management:    Induction: Intravenous  PONV Risk Score and Plan: 3 and Ondansetron, Dexamethasone, Treatment may vary due to age or medical condition and Midazolam  Airway Management Planned: Oral ETT  Additional Equipment: None  Intra-op Plan:   Post-operative Plan: Extubation in OR  Informed Consent:   Plan Discussed with:   Anesthesia Plan Comments: (2 x piv  Pts. A1C went  from 9.6 on 06/2020 to 12.2 on 03/13/2021. Patient also did not do a bowel prep. Discussed this with Dr. Dema Severin.)       Anesthesia Quick Evaluation

## 2021-03-22 NOTE — Telephone Encounter (Signed)
-----   Message from Vivi Barrack, MD sent at 03/22/2021 11:06 AM EDT ----- Regarding: FW: Colon cancer  ----- Message ----- From: Vivi Barrack, MD Sent: 03/22/2021  11:06 AM EDT To: Vivi Barrack, MD Subject: FW: Colon cancer                               Hi Team can we call him to schedule an appointment for diabetes management?  [name]. Jerline Pain, MD 03/22/2021 11:06 AM   ----- Message ----- From: Ileana Roup, MD Sent: 03/22/2021  10:48 AM EDT To: Cipriano Mile, Altamese Cabal, # Subject: Colon cancer                                   Hi Dr. Jerline Pain,  I'm Annye English, one of the colorectal surgeons with [facility]. Mr. Landgrebe was scheduled for surgery today for his colon cancer - we were planning a laparoscopic right hemicolectomy. His diabetes has been fairly out of control lately - he reports compliance with his short acting insulin regimen. His A1c was previously ~9 and on his preop labs returned 12. Doing surgery with that high of an A1c has a much higher risk to him of perioperative complications including infectious. We are planning to postpone his procedure today and reschedule for 4-6 weeks down the road. In the meantime, do you think we can aggressively work to get his diabetes under better control? We appreciate your help with him!  Annye English   [place] and Hassan Rowan - can we please reschedule for 6 weeks out? And Jefm Bryant can we please provide him instructions for bowel prep with miralax, gatorade and neomycin/flagyl. Thank y'all!

## 2021-03-23 ENCOUNTER — Other Ambulatory Visit: Payer: Self-pay | Admitting: Internal Medicine

## 2021-03-23 DIAGNOSIS — IMO0002 Reserved for concepts with insufficient information to code with codable children: Secondary | ICD-10-CM

## 2021-03-23 LAB — HEMOGLOBIN A1C
Hgb A1c MFr Bld: 12.5 % — ABNORMAL HIGH (ref 4.8–5.6)
Mean Plasma Glucose: 312 mg/dL

## 2021-03-28 ENCOUNTER — Telehealth: Payer: Self-pay | Admitting: Family Medicine

## 2021-03-28 NOTE — Telephone Encounter (Signed)
Pt called in stating that Dr. Cruzita Lederer told pt that it would be best that he finds another Endodontologist. Due to pt not being compliant.   He wanted to know if Dr. Jerline Pain had any other recommendations.   Please advise pt can be reached at the cell #

## 2021-03-28 NOTE — Telephone Encounter (Signed)
Dr. Jerline Pain, please see message and advise.

## 2021-03-29 NOTE — Telephone Encounter (Signed)
I have no one else in particular. We can place referral to have him establish with someone else in the area if he wishes.  [name]. Jerline Pain, MD 03/29/2021 7:56 AM

## 2021-03-29 NOTE — Telephone Encounter (Signed)
Called and spoke with pt and he states he needs to look at his options and he will give [place] a call back with a location/Dr. Name.

## 2021-03-31 ENCOUNTER — Encounter: Payer: Self-pay | Admitting: Family Medicine

## 2021-03-31 ENCOUNTER — Ambulatory Visit (INDEPENDENT_AMBULATORY_CARE_PROVIDER_SITE_OTHER): Payer: BC Managed Care – PPO | Admitting: Family Medicine

## 2021-03-31 VITALS — BP 130/70 | HR 79 | Temp 97.8°F | Ht 73.0 in | Wt 242.2 lb

## 2021-03-31 DIAGNOSIS — C189 Malignant neoplasm of colon, unspecified: Secondary | ICD-10-CM

## 2021-03-31 DIAGNOSIS — E1149 Type 2 diabetes mellitus with other diabetic neurological complication: Secondary | ICD-10-CM | POA: Diagnosis not present

## 2021-03-31 DIAGNOSIS — E1165 Type 2 diabetes mellitus with hyperglycemia: Secondary | ICD-10-CM | POA: Diagnosis not present

## 2021-03-31 DIAGNOSIS — IMO0002 Reserved for concepts with insufficient information to code with codable children: Secondary | ICD-10-CM

## 2021-03-31 DIAGNOSIS — E1142 Type 2 diabetes mellitus with diabetic polyneuropathy: Secondary | ICD-10-CM

## 2021-03-31 MED ORDER — INSULIN LISPRO (1 UNIT DIAL) 100 UNIT/ML (KWIKPEN): PEN_INJECTOR | SUBCUTANEOUS | 0 refills | Status: DC

## 2021-03-31 MED ORDER — FREESTYLE LIBRE 14 DAY SENSOR MISC: 4 refills | Status: DC

## 2021-03-31 MED ORDER — FREESTYLE LIBRE 14 DAY READER DEVI: 1.0000 | Freq: Every day | 0 refills | Status: DC

## 2021-03-31 MED ORDER — BD PEN NEEDLE NANO U/F 32G X 4 MM MISC: 3 refills | Status: AC

## 2021-03-31 NOTE — Assessment & Plan Note (Signed)
Foot exam performed today.  No signs of ulcerations or skin breakdown though has decreased sensation to monofilament testing.  Discussed importance of good glycemic control.

## 2021-03-31 NOTE — Patient Instructions (Signed)
It was very nice to see you today!  I will refill your insulin.  We will place an urgent referral to endocrinology.  I would like to see you back in a few months.  Please let me know if you need anything else.  Take care, Dr Jerline Pain  PLEASE NOTE:  If you had any lab tests please let us know if you have not heard back within a few days. You may see your results on mychart before we have a chance to review them but we will give you a call once they are reviewed by [place]. If we ordered any referrals today, please let us know if you have not heard from their office within the next week.   Please try these tips to maintain a healthy lifestyle:  Eat at least 3 REAL meals and 1-2 snacks per day.  Aim for no more than 5 hours between eating.  If you eat breakfast, please do so within one hour of getting up.   Each meal should contain half fruits/vegetables, one quarter protein, and one quarter carbs (no bigger than a computer mouse)  Cut down on sweet beverages. This includes juice, soda, and sweet tea.   Drink at least 1 glass of water with each meal and aim for at least 8 glasses per day  Exercise at least 150 minutes every week.

## 2021-03-31 NOTE — Progress Notes (Signed)
Wyatt Meyer is a 60 y.o. male who presents today for an office visit.  Assessment/Plan:  ronic Problems Addressed Today: Colon cancer [facility]) Following with oncology and gastroenterology.  Will need to have colectomy done soon however this was postponed until his blood sugars are under better control.  Uncontrolled type 2 diabetes mellitus with peripheral neuropathy ([name]) Had A1c performed few weeks ago prior to his surgery.  Was uncontrolled at 12.5 resulting in postponement of his colectomy.  He has not been able to afford Trulicity or basal insulin due to his high deductible plan.  He has been managing sugars with metformin and rapid acting insulin.  We will place orders for freestyle libre system per patient request.  We will also refill his Humalog.  Will place referral to endocrinology for further management.  Diabetic neuropathy ([name]) Foot exam performed today.  No signs of ulcerations or skin breakdown though has decreased sensation to monofilament testing.  Discussed importance of good glycemic control.     Subjective:  HPI:  Patient here for follow-up.  Unfortunately since his last visit he was diagnosed with colon cancer.  He is scheduled to have hemicolectomy performed recently however this was postponed due to uncontrolled A1c.  He has been referred to endocrinology in the past but they would no longer see him due to missed appointments and compliance issues.  Moreover he admits that he is struggling, and requests an endocrinologist to help manage his diabetes,.  He would like a prescription for freestyle libre monitoring system.[name]  He has joined a support group to help deal with his diabetes. He measures his blood sugar levels consistently.   Is working on diet and exercise.  The amount of insulin injections per day varies, based upon his daily readings. He injects anywhere between 2-5 and sometimes 6 shots. Quantity of insulin also varies, for the same aforementioned  reason.  He has not been able to afford daily basal insulin or other medications.  His home sugar readings are typically in the low 100s.  He expresses concern about the stiffness of his toes. Also, there are blood blisters on his left toes as well. He denies any foot pain with touch, and admits issues with stumbling. He has decreased sensation on his lower left leg, but has normal sensation on his left foot. His right sole has decreased sensation. Additionally, he notes that when driving he has difficulty feeling the driving pedals.       Objective:  Physical Exam: BP 130/70 (BP Location: Left Arm, Patient Position: Sitting, Cuff Size: Large)   Pulse 79   Temp 97.8 F (36.6 C) (Temporal)   Ht 6\' 1"  (1.854 m)   Wt 242 lb 4 oz (109.9 kg)   SpO2 98%   BMI 31.96 kg/m   Gen: No acute distress, resting comfortably CV: Regular rate and rhythm with no murmurs appreciated Pulm: Normal work of breathing, clear to auscultation bilaterally with no crackles, wheezes, or rhonchi Neuro: Grossly normal, moves all extremities Psych: Normal affect and thought content MSK: Bilateral feet without deformities.  Decreased sensation throughout both feet.  Monofilament testing negative. Skin: Has raised bumps and spots on his hands and fingers.     I,Jordan Kelly,acting as a Education administrator for [employer], MD.,have documented all relevant documentation on the behalf of Wyatt Chyle, MD,as directed by  Wyatt Chyle, MD while in the presence of Wyatt Chyle, MD.   Time Spent: 44 minutes of total time was spent on the date  of the encounter performing the following actions: chart review prior to seeing the patient including his recent visits with gastroenterology and oncology, obtaining history, performing a medically necessary exam, counseling on the treatment plan, placing orders, and documenting in our EHR.   [name]. Jerline Pain, MD 03/31/2021 12:25 PM

## 2021-03-31 NOTE — Assessment & Plan Note (Signed)
Had A1c performed few weeks ago prior to his surgery.  Was uncontrolled at 12.5 resulting in postponement of his colectomy.  He has not been able to afford Trulicity or basal insulin due to his high deductible plan.  He has been managing sugars with metformin and rapid acting insulin.  We will place orders for freestyle libre system per patient request.  We will also refill his Humalog.  Will place referral to endocrinology for further management.

## 2021-03-31 NOTE — Assessment & Plan Note (Signed)
Following with oncology and gastroenterology.  Will need to have colectomy done soon however this was postponed until his blood sugars are under better control.

## 2021-04-05 ENCOUNTER — Telehealth: Payer: Self-pay | Admitting: Oncology

## 2021-04-05 ENCOUNTER — Telehealth: Payer: Self-pay | Admitting: *Deleted

## 2021-04-05 NOTE — Telephone Encounter (Signed)
Called patient re: new patient appointment on 7/14 w/Dr. Benay Spice. Surgery has been postponed till 8/10 due to uncontrolled diabetes. He agrees would be best to see Dr. Benay Spice 2 weeks postop. Scheduling message sent.

## 2021-04-05 NOTE — Telephone Encounter (Signed)
R/s appt per 7/13 sch msg - pt is aware of new appt date and time - my chart active.

## 2021-04-06 ENCOUNTER — Inpatient Hospital Stay: Payer: BC Managed Care – PPO | Admitting: Oncology

## 2021-04-10 ENCOUNTER — Other Ambulatory Visit: Payer: Self-pay | Admitting: Family Medicine

## 2021-04-13 ENCOUNTER — Ambulatory Visit: Payer: Self-pay | Admitting: Surgery

## 2021-04-13 NOTE — H&P (Signed)
CC: Referred by Dr. Tarri Glenn for newly diagnosed adenocarcinoma of the cecum  HPI: 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  He had some fatigue and weakness which prompted lab work and is found to have iron deficiency anemia.  He was referred to GI for his first ever colonoscopy.  This was completed 01/27/21 and demonstrated: 1.  Ulcerated nonobstructing large mass on the ileocecal valve fold.  Multiple biopsies were taken.  Tattooed. 2.  3 mm polyp in the distal sigmoid removed.  Pathology: Pathology demonstrated the ileocecal valve mass to demonstrated adenocarcinoma.  Hyperplastic polyp in the distal sigmoid.  CT abdomen/pelvis with contrast 02/04/21 - no evidence of metastatic disease within the abdomen/pelvis.  3 mm right kidney stone.  No hydro.  No evident mass per se. I personally reviewed the CT scan and there is some suggestion of an ileocecal mass on coronal imaging.  He has not had CT chest as of yet.  CEA 01/27/21 - 6.0 (elevated)  He denies any complaints today.  He does report a few pounds of unintentional weight loss.  He denies any abdominal pain, nausea, vomiting, or evident blood in the stool.  PMH: HTN, DM (reports sugars more controlled as of late - upper 100s typically but better than before)  PSH: Robotic prostatectomy 7-8 yrs ago at [facility]  FHx: Denies FHx of colorectal, breast, endometrial, ovarian or cervical cancer  Social: Smokes 1/3 pack of cigarettes per day; 1-2 beers per month; trained as Land; works at [employer]. He is here today with his wife whom is a professor in [place]: A comprehensive 10 system review of systems was completed with the patient and pertinent findings as noted above.  The patient is a 60 year old male.    Review of Systems  Harrell Gave M. Jarod Bozzo MD; 02/06/2021 10:23 AM) General Present- Fatigue and Weight Loss. Not Present- Appetite Loss, Chills, Fever, Night Sweats and Weight Gain. HEENT Present- Wears glasses/contact lenses. Not Present- Earache, Hearing Loss, Hoarseness, Nose Bleed, Oral Ulcers, Ringing in the Ears, Seasonal Allergies, Sinus Pain, Sore Throat, Visual Disturbances and Yellow Eyes. Respiratory Not Present- Cough and Decreased Exercise Tolerance. Breast Not Present- Breast Mass, Breast Pain, Nipple Discharge and Skin Changes. Cardiovascular Present- Swelling of Extremities. Not Present- Chest Pain, Difficulty Breathing Lying Down, Leg Cramps, Palpitations, Rapid Heart Rate and Shortness of Breath. Gastrointestinal Present- Abdominal Pain. Not Present- Bloating, Bloody Stool, Change in Bowel Habits, Chronic diarrhea, Constipation, Difficulty Swallowing, Excessive gas, Gets full quickly at meals, Hemorrhoids, Indigestion, Nausea, Rectal Pain and Vomiting. Male Genitourinary Present- Impotence and Urine Leakage. Not Present- Blood in Urine, Change in Urinary Stream, Frequency, Nocturia, Painful Urination and Urgency. Musculoskeletal Present- Muscle Weakness. Not Present- Back Pain, Joint Pain, Joint Stiffness, Muscle Pain and Swelling of Extremities. Neurological Present- Weakness. Not Present- Decreased Memory, Fainting, Headaches, Numbness, Seizures, Tingling, Tremor and Trouble walking. Psychiatric Not Present- Anxiety and Depression. Endocrine Not Present- Appetite Changes and Cold Intolerance. Hematology Not Present- Blood Thinners, Easy Bruising, Excessive bleeding, Gland problems, HIV and Persistent Infections.   Physical Exam Harrell Gave M. Shamika Pedregon MD; 02/06/2021 10:23 AM) The physical exam findings are as follows: Note:  Constitutional: No acute distress; conversant; no deformities; wearing n95 mask Eyes: Moist conjunctiva; no lid lag; anicteric sclerae; pupils equal round  and round Neck: Trachea  midline; no palpable thyromegaly Lungs: Normal respiratory effort; no tactile fremitus CV: rrr; no palpable thrill; no pitting edema GI: Abdomen soft, nontender, nondistended; no palpable hepatosplenomegaly MSK: Normal gait; no clubbing/cyanosis Psychiatric: Appropriate affect; alert and oriented 3 Lymphatic: No palpable cervical or axillary lymphadenopathy    Assessment & Plan Harrell Gave M. 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 The pathophysiology of colon cancer was reviewed with associated pictures. -Assuming CT chest does not show any suspicious masses to suggest metastasis, we reviewed laparoscopic right hemicolectomy and rational -The planned procedure, material risks (including, but not limited to, pain, bleeding, infection, scarring, need for blood transfusion, damage to surrounding structures- blood vessels/nerves/viscus/organs, damage to ureter, urine leak, leak from anastomosis, need for additional procedures, worsening of pre-existing medical conditions, need for stoma which could be permanent, hernia, recurrence, DVT/PE, pneumonia, heart attack, stroke, death) benefits and alternatives to surgery were discussed at length. The patient's and his wife's questions were answered to their satisfaction, they voiced understanding and elected to proceed  with surgery. Additionally, we discussed typical postoperative expectations and the recovery process. -We discussed importance of glycemic control beginning today and continuing for the rest of his life. I also strongly recommended he quit smoking and discussed increased risk of perioperative complications including infectious as well as anastomotic leaks. He expresses understanding of all of this.  This patient encounter took 60 minutes today to perform the following: take history, perform exam, review outside records, interpret imaging, counsel the patient on their diagnosis and document encounter, findings & plan in the [facility] (E11.65) TOBACCO USE DISORDER (F17.200)  Signed by Ileana Roup, MD (02/06/2021 10:30 AM)

## 2021-04-17 NOTE — Patient Instructions (Addendum)
DUE TO COVID-19 ONLY ONE VISITOR IS ALLOWED TO COME WITH YOU AND STAY IN THE WAITING ROOM ONLY DURING PRE OP AND PROCEDURE.   **NO VISITORS ARE ALLOWED IN THE SHORT STAY AREA OR RECOVERY ROOM!!**  IF YOU WILL BE ADMITTED INTO THE HOSPITAL YOU ARE ALLOWED ONLY TWO SUPPORT PEOPLE DURING VISITATION HOURS ONLY (10AM -8PM)   The support person(s) may change daily. The support person(s) must pass our screening, gel in and out, and wear a mask at all times, including in the patient's room. Patients must also wear a mask when staff or their support person are in the room.  No visitors under the age of 91. Any visitor under the age of 47 must be accompanied by an adult.    COVID SWAB TESTING MUST BE COMPLETED ON:  05/01/21  [place], [place]. Drive thru open from 8am-3pm Monday-Friday.  You are not required to quarantine, however you are required to wear a well-fitted mask when you are out and around people not in your household.  Hand Hygiene often Do NOT share personal items Notify your provider if you are in close contact with someone who has COVID or you develop fever 100.4 or greater, new onset of sneezing, cough, sore throat, shortness of breath or body aches.   Your procedure is scheduled on: 05/03/21   Report to [facility] Main  Entrance    Report to admitting at 12:00 PM   Call this number if you have problems the morning of surgery [phone]   May have liquids until  11:00 AM day of surgery  CLEAR LIQUID DIET  Foods Allowed                                                                     Foods Excluded  Water, Black Coffee and tea, regular and decaf                liquids that you cannot  Plain Jell-O in any flavor  (No red)                                     see through such as: Fruit ices (not with fruit pulp)                                             milk, soups, orange juice              Iced Popsicles (No red)                                                  All solid food                                   Apple juices Sports drinks like Gatorade (No red) Lightly seasoned clear broth or  consume(fat free) Sugar, honey syrup   MORNING OF SURGERY DRINK:   DRINK 1 G2 drink BEFORE YOU LEAVE HOME, DRINK ALL OF THE  G2 DRINK AT ONE TIME.   NO SOLID FOOD AFTER 600 PM THE NIGHT BEFORE YOUR SURGERY. YOU MAY DRINK CLEAR FLUIDS. THE G2 DRINK YOU DRINK BEFORE YOU LEAVE HOME WILL BE THE LAST FLUIDS YOU DRINK BEFORE SURGERY.  PAIN IS EXPECTED AFTER SURGERY AND WILL NOT BE COMPLETELY ELIMINATED. AMBULATION AND TYLENOL WILL HELP REDUCE INCISIONAL AND GAS PAIN. MOVEMENT IS KEY!  YOU ARE EXPECTED TO BE OUT OF BED WITHIN 4 HOURS OF ADMISSION TO YOUR PATIENT ROOM.  SITTING IN THE RECLINER THROUGHOUT THE DAY IS IMPORTANT FOR DRINKING FLUIDS AND MOVING GAS THROUGHOUT THE GI TRACT.  COMPRESSION STOCKINGS SHOULD BE WORN [place] UNLESS YOU ARE WALKING.   INCENTIVE SPIROMETER SHOULD BE USED EVERY HOUR WHILE AWAKE TO DECREASE POST-OPERATIVE COMPLICATIONS SUCH AS PNEUMONIA.  WHEN DISCHARGED HOME, IT IS IMPORTANT TO CONTINUE TO WALK EVERY HOUR AND USE THE INCENTIVE SPIROMETER EVERY HOUR.    Complete one G2 drink the morning of surgery at 11:00 AM the day of surgery.   Drink 2 G2 drinks the night before surgery by 10:00 PM.            If you have questions, please contact your surgeon's office.     Oral Hygiene is also important to reduce your risk of infection.                                    Remember - BRUSH YOUR TEETH THE MORNING OF SURGERY WITH YOUR REGULAR TOOTHPASTE   Do NOT smoke after Midnight   Take these medicines the morning of surgery with A SIP OF WATER: Atorvastatin.   DO NOT TAKE ANY ORAL DIABETIC MEDICATIONS DAY OF YOUR SURGERY  How to Manage Your Diabetes Before and After Surgery  Why is it important to control my blood sugar before and after surgery? Improving blood sugar levels before and after  surgery helps healing and can limit problems. A way of improving blood sugar control is eating a healthy diet by:  Eating less sugar and carbohydrates  Increasing activity/exercise  Talking with your doctor about reaching your blood sugar goals High blood sugars (greater than 180 mg/dL) can raise your risk of infections and slow your recovery, so you will need to focus on controlling your diabetes during the weeks before surgery. Make sure that the doctor who takes care of your diabetes knows about your planned surgery including the date and location.  How do I manage my blood sugar before surgery? Check your blood sugar at least 4 times a day, starting 2 days before surgery, to make sure that the level is not too high or low. Check your blood sugar the morning of your surgery when you wake up and every 2 hours until you get to the Short Stay unit. If your blood sugar is less than 70 mg/dL, you will need to treat for low blood sugar: Do not take insulin. Treat a low blood sugar (less than 70 mg/dL) with  cup of clear juice (cranberry or apple), 4 glucose tablets, OR glucose gel. Recheck blood sugar in 15 minutes after treatment (to make sure it is greater than 70 mg/dL). If your blood sugar is not greater than 70 mg/dL on recheck, call [phone] for further instructions. Report your  blood sugar to the short stay nurse when you get to Short Stay.  If you are admitted to the hospital after surgery: Your blood sugar will be checked by the staff and you will probably be given insulin after surgery (instead of oral diabetes medicines) to make sure you have good blood sugar levels. The goal for blood sugar control after surgery is 80-180 mg/dL.   WHAT DO I DO ABOUT MY DIABETES MEDICATION?  Do not take oral diabetes medicines (pills) the morning of surgery.  THE DAY BEFORE SURGERY, take Novolog before meal as normal. Take Metformin as prescribed. Take 50% of bedtime dose of Lantus, 14  units.    THE MORNING OF SURGERY, do not take Novolog unless blood sugar is greater than 220. Then take 1/2 Novolog dose. Do not take Metformin.   If your CBG is greater than 220 mg/dL, you may take  of your sliding scale  (correction) dose of insulin.     Reviewed and Endorsed by [facility] Patient Education Committee, August 2015                               You may not have any metal on your body including jewelry, and body piercing             Do not wear lotions, powders, cologne, or deodorant              Men may shave face and neck.   Do not bring valuables to the hospital. [place].   Bring small overnight bag day of surgery.  Special Instructions: Bring a copy of your healthcare power of attorney and living will documents         the day of surgery if you haven't scanned them in before.   Please read over the following fact sheets you were given: IF YOU HAVE QUESTIONS ABOUT YOUR PRE OP INSTRUCTIONS PLEASE CALL [phone]- [place] - Preparing for Surgery Before surgery, you can play an important role.  Because skin is not sterile, your skin needs to be as free of germs as possible.  You can reduce the number of germs on your skin by washing with CHG (chlorahexidine gluconate) soap before surgery.  CHG is an antiseptic cleaner which kills germs and bonds with the skin to continue killing germs even after washing. Please DO NOT use if you have an allergy to CHG or antibacterial soaps.  If your skin becomes reddened/irritated stop using the CHG and inform your nurse when you arrive at Short Stay. Do not shave (including legs and underarms) for at least 48 hours prior to the first CHG shower.  You may shave your face/neck.  Please follow these instructions carefully:  1.  Shower with CHG Soap the night before surgery and the  morning of surgery.  2.  If you choose to wash your hair, wash your hair first as usual  with your normal  shampoo.  3.  After you shampoo, rinse your hair and body thoroughly to remove the shampoo.                             4.  Use CHG as you would any other liquid soap.  You can apply chg directly to the  skin and wash.  Gently with a scrungie or clean washcloth.  5.  Apply the CHG Soap to your body ONLY FROM THE NECK DOWN.   Do   not use on face/ open                           Wound or open sores. Avoid contact with eyes, ears mouth and   genitals (private parts).                       Wash face,  Genitals (private parts) with your normal soap.             6.  Wash thoroughly, paying special attention to the area where your    surgery  will be performed.  7.  Thoroughly rinse your body with warm water from the neck down.  8.  DO NOT shower/wash with your normal soap after using and rinsing off the CHG Soap.                9.  Pat yourself dry with a clean towel.            10.  Wear clean pajamas.            11.  Place clean sheets on your bed the night of your first shower and do not  sleep with pets. Day of Surgery : Do not apply any lotions/deodorants the morning of surgery.  Please wear clean clothes to the hospital/surgery center.  FAILURE TO FOLLOW THESE INSTRUCTIONS MAY RESULT IN THE CANCELLATION OF YOUR SURGERY  PATIENT SIGNATURE_________________________________  NURSE SIGNATURE__________________________________  ________________________________________________________________________   Wyatt Meyer  An incentive spirometer is a tool that can help keep your lungs clear and active. This tool measures how well you are filling your lungs with each breath. Taking long deep breaths may help reverse or decrease the chance of developing breathing (pulmonary) problems (especially infection) following: A long period of time when you are unable to move or be active. BEFORE THE PROCEDURE  If the spirometer includes an indicator to show your best effort, your nurse or  respiratory therapist will set it to a desired goal. If possible, sit up straight or lean slightly forward. Try not to slouch. Hold the incentive spirometer in an upright position. INSTRUCTIONS FOR USE  Sit on the edge of your bed if possible, or sit up as far as you can in bed or on a chair. Hold the incentive spirometer in an upright position. Breathe out normally. Place the mouthpiece in your mouth and seal your lips tightly around it. Breathe in slowly and as deeply as possible, raising the piston or the ball toward the top of the column. Hold your breath for 3-5 seconds or for as long as possible. Allow the piston or ball to fall to the bottom of the column. Remove the mouthpiece from your mouth and breathe out normally. Rest for a few seconds and repeat Steps 1 through 7 at least 10 times every 1-2 hours when you are awake. Take your time and take a few normal breaths between deep breaths. The spirometer may include an indicator to show your best effort. Use the indicator as a goal to work toward during each repetition. After each set of 10 deep breaths, practice coughing to be sure your lungs are clear. If you have an incision (the cut made at the time of surgery), support your incision  when coughing by placing a pillow or rolled up towels firmly against it. Once you are able to get out of bed, walk around indoors and cough well. You may stop using the incentive spirometer when instructed by your caregiver.  RISKS AND COMPLICATIONS Take your time so you do not get dizzy or light-headed. If you are in pain, you may need to take or ask for pain medication before doing incentive spirometry. It is harder to take a deep breath if you are having pain. AFTER USE Rest and breathe slowly and easily. It can be helpful to keep track of a log of your progress. Your caregiver can provide you with a simple table to help with this. If you are using the spirometer at home, follow these  instructions: [place] IF:  You are having difficultly using the spirometer. You have trouble using the spirometer as often as instructed. Your pain medication is not giving enough relief while using the spirometer. You develop fever of 100.5 F (38.1 C) or higher. SEEK IMMEDIATE MEDICAL CARE IF:  You cough up bloody sputum that had not been present before. You develop fever of 102 F (38.9 C) or greater. You develop worsening pain at or near the incision site. MAKE SURE YOU:  Understand these instructions. Will watch your condition. Will get help right away if you are not doing well or get worse. Document Released: 01/21/2007 Document Revised: 12/03/2011 Document Reviewed: 03/24/2007 [facility] Patient Information 2014 [place], [place].   ________________________________________________________________________

## 2021-04-17 NOTE — Progress Notes (Addendum)
COVID Vaccine Completed: yes x3 Date COVID Vaccine completed: 11/16/19, 12/15/19  Has received booster: 08/05/20 COVID vaccine manufacturer:  Moderna     Date of COVID positive in last 90 days: No  PCP - Dimas Chyle, MD Cardiologist - N/a  Chest x-ray - CT 02/14/21 Epic EKG - 03/13/21 Epic Stress Test - N/a ECHO - 07/29/17 Epic Cardiac Cath - N/a Pacemaker/ICD device last checked:N/a Spinal Cord Stimulator:N/a  Sleep Study - N/a CPAP -   Fasting Blood Sugar - [phone] Checks Blood Sugar ___10__ times a day Has continuous glucose monitor, freestyle libre  Blood Thinner Instructions: N/a Aspirin Instructions: Last Dose:  Activity level: Can go up a flight of stairs and perform activities of daily living without stopping and without symptoms of chest pain. Has to stop to catch breathe when out in heat or walking far distances.       Anesthesia review: DM ,CAD, Hgb 9.4, glucose from BMP 61, A1C 10.5  Patient denies shortness of breath, fever, cough and chest pain at PAT appointment   Patient verbalized understanding of instructions that were given to them at the PAT appointment. Patient was also instructed that they will need to review over the PAT instructions again at home before surgery.

## 2021-04-18 ENCOUNTER — Other Ambulatory Visit (HOSPITAL_COMMUNITY): Payer: Self-pay

## 2021-04-18 ENCOUNTER — Encounter (HOSPITAL_COMMUNITY): Payer: Self-pay

## 2021-04-18 ENCOUNTER — Encounter (HOSPITAL_COMMUNITY)
Admission: RE | Admit: 2021-04-18 | Discharge: 2021-04-18 | Disposition: A | Discharge status: Home / Self Care | Payer: BC Managed Care – PPO | Source: Ambulatory Visit | Attending: Surgery | Admitting: Surgery

## 2021-04-18 ENCOUNTER — Other Ambulatory Visit: Payer: Self-pay

## 2021-04-18 DIAGNOSIS — E119 Type 2 diabetes mellitus without complications: Secondary | ICD-10-CM | POA: Insufficient documentation

## 2021-04-18 DIAGNOSIS — F17200 Nicotine dependence, unspecified, uncomplicated: Secondary | ICD-10-CM | POA: Diagnosis not present

## 2021-04-18 DIAGNOSIS — Z794 Long term (current) use of insulin: Secondary | ICD-10-CM | POA: Insufficient documentation

## 2021-04-18 DIAGNOSIS — Z01812 Encounter for preprocedural laboratory examination: Secondary | ICD-10-CM | POA: Diagnosis present

## 2021-04-18 DIAGNOSIS — Z79899 Other long term (current) drug therapy: Secondary | ICD-10-CM | POA: Insufficient documentation

## 2021-04-18 DIAGNOSIS — C189 Malignant neoplasm of colon, unspecified: Secondary | ICD-10-CM | POA: Insufficient documentation

## 2021-04-18 DIAGNOSIS — Z8546 Personal history of malignant neoplasm of prostate: Secondary | ICD-10-CM | POA: Diagnosis not present

## 2021-04-18 LAB — GLUCOSE, CAPILLARY: Glucose-Capillary: 98 mg/dL (ref 70–99)

## 2021-04-18 LAB — COMPREHENSIVE METABOLIC PANEL
ALT: 21 U/L (ref 0–44)
AST: 24 U/L (ref 15–41)
Albumin: 3.9 g/dL (ref 3.5–5.0)
Alkaline Phosphatase: 94 U/L (ref 38–126)
Anion gap: 8 (ref 5–15)
BUN: 20 mg/dL (ref 8–23)
CO2: 25 mmol/L (ref 22–32)
Calcium: 9.4 mg/dL (ref 8.9–10.3)
Chloride: 109 mmol/L (ref 98–111)
Creatinine, Ser: 1.17 mg/dL (ref 0.61–1.24)
GFR, Estimated: >60 mL/min (ref >60)
Glucose, Bld: 61 mg/dL — ABNORMAL LOW (ref 70–99)
Potassium: 4.3 mmol/L (ref 3.5–5.1)
Sodium: 142 mmol/L (ref 135–145)
Total Bilirubin: 0.4 mg/dL (ref 0.3–1.2)
Total Protein: 7.7 g/dL (ref 6.5–8.1)

## 2021-04-18 LAB — CBC WITH DIFFERENTIAL/PLATELET
Abs Immature Granulocytes: 0.02 10*3/uL (ref 0.00–0.07)
Basophils Absolute: 0 10*3/uL (ref 0.0–0.1)
Basophils Relative: 0 %
Eosinophils Absolute: 0.1 10*3/uL (ref 0.0–0.5)
Eosinophils Relative: 2 %
HCT: 31.6 % — ABNORMAL LOW (ref 39.0–52.0)
Hemoglobin: 9.4 g/dL — ABNORMAL LOW (ref 13.0–17.0)
Immature Granulocytes: 0 %
Lymphocytes Relative: 21 %
Lymphs Abs: 1.8 10*3/uL (ref 0.7–4.0)
MCH: 28.7 pg (ref 26.0–34.0)
MCHC: 29.7 g/dL — ABNORMAL LOW (ref 30.0–36.0)
MCV: 96.3 fL (ref 80.0–100.0)
Monocytes Absolute: 0.9 10*3/uL (ref 0.1–1.0)
Monocytes Relative: 10 %
Neutro Abs: 5.6 10*3/uL (ref 1.7–7.7)
Neutrophils Relative %: 67 %
Platelets: 299 10*3/uL (ref 150–400)
RBC: 3.28 MIL/uL — ABNORMAL LOW (ref 4.22–5.81)
RDW: 14.1 % (ref 11.5–15.5)
WBC: 8.4 10*3/uL (ref 4.0–10.5)
nRBC: 0 % (ref 0.0–0.2)

## 2021-04-18 LAB — PROTIME-INR
INR: 1.1 (ref 0.8–1.2)
Prothrombin Time: 13.7 seconds (ref 11.4–15.2)

## 2021-04-18 NOTE — Progress Notes (Addendum)
Hgb 9.4, glucose from BMP 61. CBG was 98. Routed results to Dr. Dema Severin.

## 2021-04-19 LAB — HEMOGLOBIN A1C
Hgb A1c MFr Bld: 10.5 % — ABNORMAL HIGH (ref 4.8–5.6)
Mean Plasma Glucose: 255 mg/dL

## 2021-04-20 NOTE — Progress Notes (Signed)
A1C came back 10.5. Routed results to Dr. Dema Severin.

## 2021-04-26 NOTE — Progress Notes (Signed)
Anesthesia Chart Review   Case: [id number] Date/Time: 05/03/21 1345   Procedure: LAPAROSCOPIC RIGHT HEMI COLECTOMY (Right)   Anesthesia type: General   Pre-op diagnosis: COLON CANCER   Location: [facility] ROOM 02 / [facility]   Surgeons: Ileana Roup, MD       DISCUSSION:59 y.o. every day smoker with h/o DM, anemia, prostate cancer, colon cancer scheduled for above procedure 05/03/2021 with Dr. Nadeen Landau.   Surgery previously cancelled due to uncontrolled DM, A1C 12.5.  He had also not completed his bowel prep.  He has since followed up with PCP.  He has been managing blood sugar with metformin and rapid acting insulin.  A1C has improved, A1C 10.5 on 04/18/21. Evaluate CBG DOS. Labs forwarded to Dr. Dema Severin.   Hemoglobin 9.4  A1C and hemoglobin discussed with triage nurse at Dr. Orest Dikes office on 04/26/21.  VS: BP 132/67   Pulse 89   Temp 36.9 C (Oral)   Resp 14   Ht '6\' 1"'$  (1.854 m)   Wt 109.8 kg   SpO2 100%   BMI 31.93 kg/m   PROVIDERS: Vivi Barrack, MD is PCP    LABS:  forwarded to surgeon (all labs ordered are listed, but only abnormal results are displayed)  Labs Reviewed  HEMOGLOBIN A1C - Abnormal; Notable for the following components:      Result Value   Hgb A1c MFr Bld 10.5 (*)    All other components within normal limits  CBC WITH DIFFERENTIAL/PLATELET - Abnormal; Notable for the following components:   RBC 3.28 (*)    Hemoglobin 9.4 (*)    HCT 31.6 (*)    MCHC 29.7 (*)    All other components within normal limits  COMPREHENSIVE METABOLIC PANEL - Abnormal; Notable for the following components:   Glucose, Bld 61 (*)    All other components within normal limits  PROTIME-INR  GLUCOSE, CAPILLARY     IMAGES:   EKG: 03/13/2021 Rate 72 bpm  NSR  CV: Echo 07/29/2017 Study Conclusions   - Left ventricle: The cavity size was normal. Systolic function was    normal. The estimated ejection fraction was in the range of 55%    to 60%. Wall motion was  normal; there were no regional wall    motion abnormalities. There was an increased relative    contribution of atrial contraction to ventricular filling.    Doppler parameters are consistent with abnormal left ventricular    relaxation (grade 1 diastolic dysfunction).  - Tricuspid valve: There was trivial regurgitation. 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/23/2016   Hyperlipidemia    on meds   Iron deficiency anemia    on IRON and Geritol   Kidney stone    Klebsiella sepsis ([place]) 03/25/2015   Osteoarthritis 08/11/2012   Peripheral neuropathy 08/11/2012   Prostate cancer ([place])    Smoker    SUI (stress urinary incontinence), male 02/03/2013   Uncontrolled diabetes mellitus with complications ([place]) 0000000   on meds    Past Surgical History:  Procedure Laterality Date   PROSTATE SURGERY  09/25/2011   PROSTATE SURGERY     WISDOM TOOTH EXTRACTION      MEDICATIONS:  Ascorbic Acid (VITAMIN C) 100 MG tablet   atorvastatin (LIPITOR) 10 MG tablet   Continuous Blood Gluc Receiver (FREESTYLE LIBRE 14 DAY READER) DEVI   Continuous Blood Gluc Sensor (FREESTYLE LIBRE 14 DAY SENSOR) MISC  insulin glargine (LANTUS SOLOSTAR) 100 UNIT/ML Solostar Pen   insulin lispro (HUMALOG KWIKPEN) 100 UNIT/ML KwikPen   Insulin Pen Needle (BD PEN NEEDLE NANO U/F) 32G X 4 MM MISC   Iron, Ferrous Sulfate, 325 (65 Fe) MG TABS   Iron-Vitamins (GERITOL PO)   metFORMIN (GLUCOPHAGE-XR) 500 MG 24 hr tablet   NOVOLOG FLEXPEN 100 UNIT/ML FlexPen   ONE TOUCH LANCETS MISC    0.9 %  sodium chloride infusion     Konrad Felix, PA-C [facility] Pre-Surgical Testing [phone]

## 2021-05-01 ENCOUNTER — Other Ambulatory Visit: Payer: Self-pay | Admitting: Surgery

## 2021-05-02 LAB — SARS CORONAVIRUS 2 (TAT 6-24 HRS): SARS Coronavirus 2: NEGATIVE

## 2021-05-02 MED ORDER — SODIUM CHLORIDE 0.9 % IV SOLN
2.0000 g | INTRAVENOUS | Status: AC
  Administered 2021-05-03: 2 g via INTRAVENOUS
  Filled 2021-05-02: qty 2

## 2021-05-02 MED ORDER — BUPIVACAINE LIPOSOME 1.3 % IJ SUSP
20.0000 mL | Freq: Once | INTRAMUSCULAR | Status: DC
  Filled 2021-05-02: qty 20

## 2021-05-03 ENCOUNTER — Other Ambulatory Visit: Payer: Self-pay

## 2021-05-03 ENCOUNTER — Inpatient Hospital Stay (HOSPITAL_COMMUNITY)
Admission: RE | Admit: 2021-05-03 | Discharge: 2021-05-05 | DRG: 331 | Disposition: A | Discharge status: Home / Self Care | Payer: BC Managed Care – PPO | Source: Ambulatory Visit | Attending: Surgery | Admitting: Surgery

## 2021-05-03 ENCOUNTER — Encounter (HOSPITAL_COMMUNITY)
Admission: RE | Disposition: A | Discharge status: Home / Self Care | Payer: Self-pay | Source: Ambulatory Visit | Attending: Surgery

## 2021-05-03 ENCOUNTER — Inpatient Hospital Stay (HOSPITAL_COMMUNITY): Payer: BC Managed Care – PPO | Admitting: Physician Assistant

## 2021-05-03 ENCOUNTER — Encounter (HOSPITAL_COMMUNITY): Payer: Self-pay | Admitting: Surgery

## 2021-05-03 ENCOUNTER — Inpatient Hospital Stay (HOSPITAL_COMMUNITY): Payer: BC Managed Care – PPO | Admitting: Registered Nurse

## 2021-05-03 DIAGNOSIS — E1142 Type 2 diabetes mellitus with diabetic polyneuropathy: Secondary | ICD-10-CM | POA: Diagnosis present

## 2021-05-03 DIAGNOSIS — Z8249 Family history of ischemic heart disease and other diseases of the circulatory system: Secondary | ICD-10-CM | POA: Diagnosis not present

## 2021-05-03 DIAGNOSIS — C182 Malignant neoplasm of ascending colon: Secondary | ICD-10-CM | POA: Diagnosis present

## 2021-05-03 DIAGNOSIS — Z833 Family history of diabetes mellitus: Secondary | ICD-10-CM

## 2021-05-03 DIAGNOSIS — E785 Hyperlipidemia, unspecified: Secondary | ICD-10-CM | POA: Diagnosis present

## 2021-05-03 DIAGNOSIS — Z20822 Contact with and (suspected) exposure to covid-19: Secondary | ICD-10-CM | POA: Diagnosis present

## 2021-05-03 DIAGNOSIS — E1165 Type 2 diabetes mellitus with hyperglycemia: Secondary | ICD-10-CM | POA: Diagnosis present

## 2021-05-03 DIAGNOSIS — E538 Deficiency of other specified B group vitamins: Secondary | ICD-10-CM | POA: Diagnosis present

## 2021-05-03 DIAGNOSIS — K660 Peritoneal adhesions (postprocedural) (postinfection): Secondary | ICD-10-CM | POA: Diagnosis present

## 2021-05-03 DIAGNOSIS — F17210 Nicotine dependence, cigarettes, uncomplicated: Secondary | ICD-10-CM | POA: Diagnosis present

## 2021-05-03 DIAGNOSIS — Z8546 Personal history of malignant neoplasm of prostate: Secondary | ICD-10-CM

## 2021-05-03 DIAGNOSIS — Z794 Long term (current) use of insulin: Secondary | ICD-10-CM | POA: Diagnosis not present

## 2021-05-03 DIAGNOSIS — Z9079 Acquired absence of other genital organ(s): Secondary | ICD-10-CM | POA: Diagnosis not present

## 2021-05-03 DIAGNOSIS — I10 Essential (primary) hypertension: Secondary | ICD-10-CM | POA: Diagnosis present

## 2021-05-03 DIAGNOSIS — D509 Iron deficiency anemia, unspecified: Secondary | ICD-10-CM | POA: Diagnosis present

## 2021-05-03 DIAGNOSIS — Z9049 Acquired absence of other specified parts of digestive tract: Secondary | ICD-10-CM

## 2021-05-03 HISTORY — PX: LAPAROSCOPIC RIGHT HEMI COLECTOMY: SHX5926

## 2021-05-03 LAB — GLUCOSE, CAPILLARY
Glucose-Capillary: 130 mg/dL — ABNORMAL HIGH (ref 70–99)
Glucose-Capillary: 144 mg/dL — ABNORMAL HIGH (ref 70–99)
Glucose-Capillary: 201 mg/dL — ABNORMAL HIGH (ref 70–99)
Glucose-Capillary: 233 mg/dL — ABNORMAL HIGH (ref 70–99)

## 2021-05-03 LAB — TYPE AND SCREEN
ABO/RH(D): B NEG
Antibody Screen: NEGATIVE

## 2021-05-03 SURGERY — LAPAROSCOPIC RIGHT HEMI COLECTOMY
Anesthesia: General | Site: Abdomen | Laterality: Right

## 2021-05-03 MED ORDER — SUFENTANIL CITRATE 50 MCG/ML IV SOLN
INTRAVENOUS | Status: AC
  Filled 2021-05-03: qty 1

## 2021-05-03 MED ORDER — PHENYLEPHRINE HCL (PRESSORS) 10 MG/ML IV SOLN
INTRAVENOUS | Status: DC | PRN
  Administered 2021-05-03 (×3): 80 ug via INTRAVENOUS

## 2021-05-03 MED ORDER — SODIUM CHLORIDE (PF) 0.9 % IJ SOLN
INTRAMUSCULAR | Status: AC
  Filled 2021-05-03: qty 10

## 2021-05-03 MED ORDER — IBUPROFEN 400 MG PO TABS
600.0000 mg | ORAL_TABLET | Freq: Four times a day (QID) | ORAL | Status: DC | PRN
  Administered 2021-05-03 – 2021-05-05 (×3): 600 mg via ORAL
  Filled 2021-05-03 (×3): qty 1

## 2021-05-03 MED ORDER — DIPHENHYDRAMINE HCL 12.5 MG/5ML PO ELIX: 12.5000 mg | ORAL_SOLUTION | Freq: Four times a day (QID) | ORAL | Status: DC | PRN

## 2021-05-03 MED ORDER — LIDOCAINE 2% (20 MG/ML) 5 ML SYRINGE
INTRAMUSCULAR | Status: DC | PRN
Start: 2021-05-03 — End: 2021-05-03
  Administered 2021-05-03: 1.5 mg/kg/h via INTRAVENOUS

## 2021-05-03 MED ORDER — ENSURE SURGERY PO LIQD
237.0000 mL | Freq: Two times a day (BID) | ORAL | Status: DC
  Administered 2021-05-04 (×2): 237 mL via ORAL

## 2021-05-03 MED ORDER — HEPARIN SODIUM (PORCINE) 5000 UNIT/ML IJ SOLN
5000.0000 [IU] | Freq: Once | INTRAMUSCULAR | Status: AC
  Administered 2021-05-03: 5000 [IU] via SUBCUTANEOUS
  Filled 2021-05-03: qty 1

## 2021-05-03 MED ORDER — LIDOCAINE 2% (20 MG/ML) 5 ML SYRINGE
INTRAMUSCULAR | Status: AC
  Filled 2021-05-03: qty 5

## 2021-05-03 MED ORDER — HYDRALAZINE HCL 20 MG/ML IJ SOLN: 10.0000 mg | INTRAMUSCULAR | Status: DC | PRN

## 2021-05-03 MED ORDER — ALVIMOPAN 12 MG PO CAPS
12.0000 mg | ORAL_CAPSULE | ORAL | Status: AC
  Administered 2021-05-03: 12 mg via ORAL
  Filled 2021-05-03: qty 1

## 2021-05-03 MED ORDER — SUFENTANIL CITRATE 50 MCG/ML IV SOLN
INTRAVENOUS | Status: DC | PRN
  Administered 2021-05-03 (×6): 10 ug via INTRAVENOUS

## 2021-05-03 MED ORDER — PROMETHAZINE HCL 25 MG/ML IJ SOLN: 6.2500 mg | INTRAMUSCULAR | Status: DC | PRN

## 2021-05-03 MED ORDER — LACTATED RINGERS IR SOLN
Status: DC | PRN
  Administered 2021-05-03: 1000 mL

## 2021-05-03 MED ORDER — LACTATED RINGERS IV SOLN: INTRAVENOUS | Status: DC

## 2021-05-03 MED ORDER — DEXAMETHASONE SODIUM PHOSPHATE 10 MG/ML IJ SOLN
INTRAMUSCULAR | Status: DC | PRN
  Administered 2021-05-03: 10 mg via INTRAVENOUS

## 2021-05-03 MED ORDER — ALUM & MAG HYDROXIDE-SIMETH 200-200-20 MG/5ML PO SUSP: 30.0000 mL | Freq: Four times a day (QID) | ORAL | Status: DC | PRN

## 2021-05-03 MED ORDER — ALVIMOPAN 12 MG PO CAPS
12.0000 mg | ORAL_CAPSULE | Freq: Two times a day (BID) | ORAL | Status: DC
  Administered 2021-05-04 (×2): 12 mg via ORAL
  Filled 2021-05-03 (×2): qty 1

## 2021-05-03 MED ORDER — TRAMADOL HCL 50 MG PO TABS: 50.0000 mg | ORAL_TABLET | Freq: Four times a day (QID) | ORAL | Status: DC | PRN

## 2021-05-03 MED ORDER — OXYCODONE HCL 5 MG/5ML PO SOLN: 5.0000 mg | Freq: Once | ORAL | Status: DC | PRN

## 2021-05-03 MED ORDER — CHLORHEXIDINE GLUCONATE CLOTH 2 % EX PADS: 6.0000 | MEDICATED_PAD | Freq: Once | CUTANEOUS | Status: DC

## 2021-05-03 MED ORDER — METRONIDAZOLE 500 MG PO TABS: 1000.0000 mg | ORAL_TABLET | ORAL | Status: DC

## 2021-05-03 MED ORDER — HYDROMORPHONE HCL 1 MG/ML IJ SOLN
0.2500 mg | INTRAMUSCULAR | Status: DC | PRN
  Administered 2021-05-03: 0.5 mg via INTRAVENOUS

## 2021-05-03 MED ORDER — INSULIN ASPART 100 UNIT/ML IJ SOLN
0.0000 [IU] | Freq: Three times a day (TID) | INTRAMUSCULAR | Status: DC
  Administered 2021-05-03: 5 [IU] via SUBCUTANEOUS
  Administered 2021-05-04 (×3): 3 [IU] via SUBCUTANEOUS

## 2021-05-03 MED ORDER — ONDANSETRON HCL 4 MG/2ML IJ SOLN: 4.0000 mg | Freq: Four times a day (QID) | INTRAMUSCULAR | Status: DC | PRN

## 2021-05-03 MED ORDER — PROPOFOL 500 MG/50ML IV EMUL
INTRAVENOUS | Status: AC
  Filled 2021-05-03: qty 50

## 2021-05-03 MED ORDER — LIDOCAINE 2% (20 MG/ML) 5 ML SYRINGE
INTRAMUSCULAR | Status: AC
  Filled 2021-05-03: qty 10

## 2021-05-03 MED ORDER — 0.9 % SODIUM CHLORIDE (POUR BTL) OPTIME
TOPICAL | Status: DC | PRN
  Administered 2021-05-03: 2000 mL

## 2021-05-03 MED ORDER — PROPOFOL 10 MG/ML IV BOLUS
INTRAVENOUS | Status: AC
  Filled 2021-05-03: qty 20

## 2021-05-03 MED ORDER — ONDANSETRON HCL 4 MG PO TABS: 4.0000 mg | ORAL_TABLET | Freq: Four times a day (QID) | ORAL | Status: DC | PRN

## 2021-05-03 MED ORDER — FERROUS SULFATE 325 (65 FE) MG PO TABS: 325.0000 mg | ORAL_TABLET | Freq: Every day | ORAL | Status: DC

## 2021-05-03 MED ORDER — HEPARIN SODIUM (PORCINE) 5000 UNIT/ML IJ SOLN
5000.0000 [IU] | Freq: Three times a day (TID) | INTRAMUSCULAR | Status: DC
  Administered 2021-05-03 – 2021-05-05 (×5): 5000 [IU] via SUBCUTANEOUS
  Filled 2021-05-03 (×5): qty 1

## 2021-05-03 MED ORDER — KETAMINE HCL 10 MG/ML IJ SOLN
INTRAMUSCULAR | Status: DC | PRN
  Administered 2021-05-03: 20 mg via INTRAVENOUS
  Administered 2021-05-03: 50 mg via INTRAVENOUS

## 2021-05-03 MED ORDER — BUPIVACAINE-EPINEPHRINE (PF) 0.25% -1:200000 IJ SOLN
INTRAMUSCULAR | Status: AC
  Filled 2021-05-03: qty 30

## 2021-05-03 MED ORDER — BUPIVACAINE-EPINEPHRINE 0.25% -1:200000 IJ SOLN
INTRAMUSCULAR | Status: DC | PRN
  Administered 2021-05-03: 30 mL

## 2021-05-03 MED ORDER — HYDROMORPHONE HCL 1 MG/ML IJ SOLN
INTRAMUSCULAR | Status: AC
  Filled 2021-05-03: qty 1

## 2021-05-03 MED ORDER — SIMETHICONE 80 MG PO CHEW: 40.0000 mg | CHEWABLE_TABLET | Freq: Four times a day (QID) | ORAL | Status: DC | PRN

## 2021-05-03 MED ORDER — AMISULPRIDE (ANTIEMETIC) 5 MG/2ML IV SOLN: 10.0000 mg | Freq: Once | INTRAVENOUS | Status: DC | PRN

## 2021-05-03 MED ORDER — POLYETHYLENE GLYCOL 3350 17 GM/SCOOP PO POWD
1.0000 | Freq: Once | ORAL | Status: DC
  Filled 2021-05-03: qty 255

## 2021-05-03 MED ORDER — PROPOFOL 10 MG/ML IV BOLUS
INTRAVENOUS | Status: DC | PRN
  Administered 2021-05-03: 160 mg via INTRAVENOUS
  Administered 2021-05-03: 20 mg via INTRAVENOUS

## 2021-05-03 MED ORDER — ROCURONIUM BROMIDE 10 MG/ML (PF) SYRINGE
PREFILLED_SYRINGE | INTRAVENOUS | Status: AC
  Filled 2021-05-03: qty 10

## 2021-05-03 MED ORDER — BUPIVACAINE LIPOSOME 1.3 % IJ SUSP
INTRAMUSCULAR | Status: DC | PRN
  Administered 2021-05-03: 20 mL

## 2021-05-03 MED ORDER — ONDANSETRON HCL 4 MG/2ML IJ SOLN
INTRAMUSCULAR | Status: DC | PRN
  Administered 2021-05-03: 4 mg via INTRAVENOUS

## 2021-05-03 MED ORDER — DIPHENHYDRAMINE HCL 50 MG/ML IJ SOLN: 12.5000 mg | Freq: Four times a day (QID) | INTRAMUSCULAR | Status: DC | PRN

## 2021-05-03 MED ORDER — VITAMIN C 250 MG PO TABS
250.0000 mg | ORAL_TABLET | ORAL | Status: DC
  Administered 2021-05-04: 250 mg via ORAL
  Filled 2021-05-03: qty 1

## 2021-05-03 MED ORDER — NEOMYCIN SULFATE 500 MG PO TABS: 1000.0000 mg | ORAL_TABLET | ORAL | Status: DC

## 2021-05-03 MED ORDER — PHENYLEPHRINE HCL-NACL 20-0.9 MG/250ML-% IV SOLN
INTRAVENOUS | Status: DC | PRN
  Administered 2021-05-03: 25 ug/min via INTRAVENOUS

## 2021-05-03 MED ORDER — ACETAMINOPHEN 500 MG PO TABS
1000.0000 mg | ORAL_TABLET | ORAL | Status: AC
  Administered 2021-05-03: 1000 mg via ORAL
  Filled 2021-05-03: qty 2

## 2021-05-03 MED ORDER — ORAL CARE MOUTH RINSE: 15.0000 mL | Freq: Once | OROMUCOSAL | Status: AC

## 2021-05-03 MED ORDER — PHENYLEPHRINE HCL (PRESSORS) 10 MG/ML IV SOLN
INTRAVENOUS | Status: AC
  Filled 2021-05-03: qty 1

## 2021-05-03 MED ORDER — SUGAMMADEX SODIUM 500 MG/5ML IV SOLN
INTRAVENOUS | Status: AC
  Filled 2021-05-03: qty 5

## 2021-05-03 MED ORDER — CHLORHEXIDINE GLUCONATE 0.12 % MT SOLN
15.0000 mL | Freq: Once | OROMUCOSAL | Status: AC
  Administered 2021-05-03: 15 mL via OROMUCOSAL

## 2021-05-03 MED ORDER — HYDROMORPHONE HCL 1 MG/ML IJ SOLN: 0.5000 mg | INTRAMUSCULAR | Status: DC | PRN

## 2021-05-03 MED ORDER — PHENYLEPHRINE 40 MCG/ML (10ML) SYRINGE FOR IV PUSH (FOR BLOOD PRESSURE SUPPORT)
PREFILLED_SYRINGE | INTRAVENOUS | Status: AC
  Filled 2021-05-03: qty 10

## 2021-05-03 MED ORDER — ATORVASTATIN CALCIUM 10 MG PO TABS
10.0000 mg | ORAL_TABLET | Freq: Every day | ORAL | Status: DC
  Administered 2021-05-04 – 2021-05-05 (×2): 10 mg via ORAL
  Filled 2021-05-03 (×2): qty 1

## 2021-05-03 MED ORDER — ROCURONIUM BROMIDE 10 MG/ML (PF) SYRINGE
PREFILLED_SYRINGE | INTRAVENOUS | Status: DC | PRN
  Administered 2021-05-03: 100 mg via INTRAVENOUS

## 2021-05-03 MED ORDER — MIDAZOLAM HCL 5 MG/5ML IJ SOLN
INTRAMUSCULAR | Status: DC | PRN
  Administered 2021-05-03: 2 mg via INTRAVENOUS

## 2021-05-03 MED ORDER — MIDAZOLAM HCL 2 MG/2ML IJ SOLN
INTRAMUSCULAR | Status: AC
  Filled 2021-05-03: qty 2

## 2021-05-03 MED ORDER — INSULIN ASPART 100 UNIT/ML IJ SOLN
0.0000 [IU] | Freq: Every day | INTRAMUSCULAR | Status: DC
  Administered 2021-05-03: 2 [IU] via SUBCUTANEOUS

## 2021-05-03 MED ORDER — OXYCODONE HCL 5 MG PO TABS
5.0000 mg | ORAL_TABLET | Freq: Once | ORAL | Status: DC | PRN
Start: 2021-05-03 — End: 2021-05-03

## 2021-05-03 MED ORDER — MEPERIDINE HCL 50 MG/ML IJ SOLN: 6.2500 mg | INTRAMUSCULAR | Status: DC | PRN

## 2021-05-03 MED ORDER — ACETAMINOPHEN 500 MG PO TABS
1000.0000 mg | ORAL_TABLET | Freq: Four times a day (QID) | ORAL | Status: DC
  Administered 2021-05-03 – 2021-05-05 (×7): 1000 mg via ORAL
  Filled 2021-05-03 (×7): qty 2

## 2021-05-03 MED ORDER — SODIUM CHLORIDE (PF) 0.9 % IJ SOLN
INTRAMUSCULAR | Status: AC
  Filled 2021-05-03: qty 20

## 2021-05-03 MED ORDER — ENSURE PRE-SURGERY PO LIQD
296.0000 mL | Freq: Once | ORAL | Status: DC
  Filled 2021-05-03: qty 296

## 2021-05-03 MED ORDER — LABETALOL HCL 5 MG/ML IV SOLN
INTRAVENOUS | Status: AC
  Filled 2021-05-03: qty 4

## 2021-05-03 MED ORDER — DEXAMETHASONE SODIUM PHOSPHATE 10 MG/ML IJ SOLN
INTRAMUSCULAR | Status: AC
  Filled 2021-05-03: qty 1

## 2021-05-03 MED ORDER — ENSURE PRE-SURGERY PO LIQD
592.0000 mL | Freq: Once | ORAL | Status: DC
  Filled 2021-05-03: qty 592

## 2021-05-03 MED ORDER — LABETALOL HCL 5 MG/ML IV SOLN
INTRAVENOUS | Status: DC | PRN
  Administered 2021-05-03 (×2): 5 mg via INTRAVENOUS

## 2021-05-03 MED ORDER — ONDANSETRON HCL 4 MG/2ML IJ SOLN
INTRAMUSCULAR | Status: AC
  Filled 2021-05-03: qty 2

## 2021-05-03 MED ORDER — LIDOCAINE 2% (20 MG/ML) 5 ML SYRINGE
INTRAMUSCULAR | Status: DC | PRN
  Administered 2021-05-03: 100 mg via INTRAVENOUS

## 2021-05-03 MED ORDER — FERROUS SULFATE 325 (65 FE) MG PO TABS: 325.0000 mg | ORAL_TABLET | ORAL | Status: DC

## 2021-05-03 SURGICAL SUPPLY — 61 items
APL PRP STRL LF DISP 70% ISPRP (MISCELLANEOUS) ×1
APPLIER CLIP ROT 10 11.4 M/L (STAPLE)
APR CLP MED LRG 11.4X10 (STAPLE)
BAG COUNTER SPONGE SURGICOUNT (BAG) IMPLANT
BAG SPNG CNTER NS LX DISP (BAG)
BLADE CLIPPER SURG (BLADE) IMPLANT
CABLE HIGH FREQUENCY MONO STRZ (ELECTRODE) ×4 IMPLANT
CELLS DAT CNTRL 66122 CELL SVR (MISCELLANEOUS) IMPLANT
CHLORAPREP W/TINT 26 (MISCELLANEOUS) ×2 IMPLANT
CLIP APPLIE ROT 10 11.4 M/L (STAPLE) IMPLANT
DECANTER SPIKE VIAL GLASS SM (MISCELLANEOUS) ×2 IMPLANT
DISSECTOR BLUNT TIP ENDO 5MM (MISCELLANEOUS) IMPLANT
DRSG OPSITE POSTOP 4X6 (GAUZE/BANDAGES/DRESSINGS) ×1 IMPLANT
ELECT REM PT RETURN 15FT ADLT (MISCELLANEOUS) ×2 IMPLANT
GAUZE SPONGE 4X4 12PLY STRL (GAUZE/BANDAGES/DRESSINGS) ×2 IMPLANT
GLOVE SURG ENC MOIS LTX SZ7.5 (GLOVE) ×4 IMPLANT
GLOVE SURG NEOPR MICRO LF SZ8 (GLOVE) ×4 IMPLANT
GOWN STRL REUS W/TWL XL LVL3 (GOWN DISPOSABLE) ×8 IMPLANT
KIT TURNOVER KIT A (KITS) ×2 IMPLANT
LIGASURE IMPACT 36 18CM CVD LR (INSTRUMENTS) IMPLANT
NS IRRIG 1000ML POUR BTL (IV SOLUTION) ×2 IMPLANT
PACK COLON (CUSTOM PROCEDURE TRAY) ×2 IMPLANT
PAD POSITIONING PINK XL (MISCELLANEOUS) IMPLANT
PENCIL SMOKE EVACUATOR (MISCELLANEOUS) IMPLANT
PROTECTOR NERVE ULNAR (MISCELLANEOUS) IMPLANT
RELOAD PROXIMATE 75MM BLUE (ENDOMECHANICALS) ×2 IMPLANT
RELOAD STAPLE 75 3.8 BLU REG (ENDOMECHANICALS) IMPLANT
RETRACTOR WND ALEXIS 18 MED (MISCELLANEOUS) IMPLANT
RTRCTR WOUND ALEXIS 18CM MED (MISCELLANEOUS)
SCISSORS LAP 5X35 DISP (ENDOMECHANICALS) ×2 IMPLANT
SEALER TISSUE G2 STRG ARTC 35C (ENDOMECHANICALS) IMPLANT
SET IRRIG TUBING LAPAROSCOPIC (IRRIGATION / IRRIGATOR) IMPLANT
SET TUBE SMOKE EVAC HIGH FLOW (TUBING) ×2 IMPLANT
SHEARS HARMONIC ACE PLUS 36CM (ENDOMECHANICALS) IMPLANT
SLEEVE ADV FIXATION 5X100MM (TROCAR) ×4 IMPLANT
SLEEVE ENDOPATH XCEL 5M (ENDOMECHANICALS) ×2 IMPLANT
STAPLER 90 3.5 STAND SLIM (STAPLE) ×2
STAPLER 90 3.5 STD SLIM (STAPLE) IMPLANT
STAPLER PROXIMATE 75MM BLUE (STAPLE) ×1 IMPLANT
STAPLER VISISTAT 35W (STAPLE) ×2 IMPLANT
SUT MNCRL AB 4-0 PS2 18 (SUTURE) ×2 IMPLANT
SUT PDS AB 1 CT1 27 (SUTURE) ×4 IMPLANT
SUT PROLENE 2 0 CT2 30 (SUTURE) IMPLANT
SUT PROLENE 2 0 KS (SUTURE) IMPLANT
SUT SILK 2 0 (SUTURE) ×2
SUT SILK 2 0 SH CR/8 (SUTURE) ×2 IMPLANT
SUT SILK 2-0 18XBRD TIE 12 (SUTURE) ×1 IMPLANT
SUT SILK 3 0 (SUTURE) ×2
SUT SILK 3 0 SH CR/8 (SUTURE) ×2 IMPLANT
SUT SILK 3-0 18XBRD TIE 12 (SUTURE) ×1 IMPLANT
SYS LAPSCP GELPORT 120MM (MISCELLANEOUS)
SYSTEM LAPSCP GELPORT 120MM (MISCELLANEOUS) IMPLANT
TAPE CLOTH 4X10 WHT NS (GAUZE/BANDAGES/DRESSINGS) IMPLANT
TOWEL OR 17X26 10 PK STRL BLUE (TOWEL DISPOSABLE) ×2 IMPLANT
TRAY FOLEY MTR SLVR 14FR STAT (SET/KITS/TRAYS/PACK) ×2 IMPLANT
TRAY FOLEY MTR SLVR 16FR STAT (SET/KITS/TRAYS/PACK) IMPLANT
TROCAR ADV FIXATION 5X100MM (TROCAR) ×2 IMPLANT
TROCAR BALLN 12MMX100 BLUNT (TROCAR) ×2 IMPLANT
TROCAR XCEL NON-BLD 11X100MML (ENDOMECHANICALS) IMPLANT
TUBING CONNECTING 10 (TUBING) ×4 IMPLANT
YANKAUER SUCT BULB TIP NO VENT (SUCTIONS) ×2 IMPLANT

## 2021-05-03 NOTE — Op Note (Signed)
PATIENT: Wyatt Meyer  60 y.o. male  Patient Care Team: Vivi Barrack, MD as PCP - General (Family Medicine)  PREOP DIAGNOSIS: Ascending colon cancer  POSTOP DIAGNOSIS: Same  PROCEDURE:  1.  Laparoscopic right hemicolectomy 2.  Laparoscopic lysis of adhesions, extensive, x 60 minutes  SURGEON: Sharon Mt. Dema Severin, MD  ASSISTANT: Michael Boston, MD  ANESTHESIA: General endotracheal  EBL: 100 mL Total I/O In: 2000 [I.V.:2000] Out: 450 [Urine:350; Blood:100]  DRAINS: None  SPECIMEN: Right colon (terminal ileum, cecum, appendix, ascending and proximal transverse colon)  COUNTS: Sponge, needle and instrument counts were reported correct x2  FINDINGS:  Extensive adhesions, the majority of which are omental containing.  These were covering much of the upper midline as well as right upper quadrant.  There were adhesions of the omentum also to his falciform ligament and over much of the anterior surface of the liver.  Once all adhesions were cleared, a tattoo was evident in the proximal ascending colon.  No obvious metastatic disease on visceral parietal peritoneum or liver.   NARRATIVE:  The patient was identified & brought into the operating room, placed supine on the operating table and SCDs were applied to the lower extremities. General endotracheal anesthesia was induced. The patient was positioned supine with arms tucked. Antibiotics were administered. A foley catheter was placed under sterile conditions. Hair in the region of planned surgery was clipped. The abdomen was prepped and draped in a sterile fashion. A timeout was performed confirming our patient and plan.   Beginning with the extraction port, a supraumbilical incision was made and carried down to the midline fascia incorporating his prior prostatectomy incision.  Subcutaneous tissue was then incised with electrocautery. The peritoneum was identified and elevated between clamps and carefully opened sharply.   There are omental adhesions to the peritoneum at this location and these were carefully taken down sharply.  A working space is created such that a Materials engineer with a cap and associated port was then placed. The abdomen was insufflated to 15 mmHg with Co2. A laparoscope was placed and camera inspection revealed no evidence of injury. Bilateral TAP blocks were then performed under laparoscopic visualization using a mixture of 0.25% marcaine with epinepherine + Exparel.  A 5 mm port was placed in the right and left lower quadrant.  These were done under direct visualization.  Laparoscopic adhesiolysis then commenced taking omentum down from the anterior abdominal wall.  There were omental adhesions in both upper quadrants and extending onto his falciform ligament.  These were all taken down sharply and the falciform was partially taken down using the Enseal device.  This was necessary to even see the hepatic flexure of his colon.  Adhesions were also noted to be present in his right lower quadrant.  At this point andhave been developed such that we could place a left 5 mm trocar.  This was done under direct visualization.  The abdomen was surveyed.  A tattoo was identified in the proximal ascending colon.  This part of the colon is mobile and there is no evident spread.  The liver was normal in appearance.  There is no gross surface disease.  Peritoneal surfaces are without any lesions or other concerning findings.  He was positioned in trendelenburg with left side down.  The medial side of his ileal and cecal mesentery was quite adherent in his right lower quadrant.  Therefore, a lateral to medial approach was selected.  The appendix was grasped and retracted  medially.  The cecum and ascending colon were mobilized by incising the Cristal Qadir line of Toldt.  The associated mesocolon was reflected medially.  Attention was then turned to the terminal ileal mesentery mobilization.  This was necessary in  order to facilitate reach of the small intestine to the transverse colon.  There was scar tissues identified this location which were incised sharply.  We stayed in a plane well above the retroperitoneal structures including the right gonadals and ureter.  The hepatic flexure was then taken down from the lateral approach using the Enseal device.  The duodenum was identified and protected free of injury, being swept "down."  He was then repositioned in reverse Trendelenburg.  The omentum was elevated anteriorly.  The colon was retracted inferiorly.  The lesser sac was gained.  We are then able to mobilize the hepatic flexure fully from this approach as well.  At this point, the cecum easily reached into the left upper quadrant.  The abdomen was inspected and hemostasis verified.  At this point, the abdomen was desufflated and the terminal ileum and right colon delivered through the wound protector. The distal ileum was then transected using a GIA blue load stapler.  A Babcock was placed on the distal ileum to assist in maintaining orientation.  The mesentery was then ligated using the Enseal device.  The ileocolic pedicle was identified.  This was circumferentially dissected.  Duodenum was confirmed to again be down.  This was then ligated and divided using Enseal device.  The pedicle was inspected and noted to be hemostatic.    The distal point of transection was identified on the transverse colon at a location that left the main middle colic pedicle perfusing the planned staple line.  The specimen includes the right branch of the middle colic vessels.  A window was created in the mesentery at this location and the colon divided using a GIA blue load stapler.  The intervening mesentery including the right branch of middle colic's was divided using the Enseal device.  Cut edge of the mesentery was inspected noted to be hemostatic.  The specimen was passed off.  Attention was turned to creating the  anastomosis. The distal ileum and transverse colon were inspected for orientation to ensure no twisting nor bowel included in the mesenteric defect. An anastomosis was created between the distal ileum and the transverse colon using a 75 mm GIA blue load stapler. The staple line was inspected and noted to be hemostatic.  The common enterotomy channel was closed using a TA 90 blue load stapler. Hemostasis was achieved at the staple line using 3-0 silk U-stitches. 2-0 silk sutures were used to imbricate the corners of the staple line as well.  A 2-0 silk suture was placed securing the "apex" of the anastomosis. The anastomosis was palpated and noted to be widely patent.  Omentum was brought down over this as well.  This was then placed back into the abdomen. The abdomen was then irrigated with sterile saline and hemostasis verified. The wound protector cap was replaced and CO2 reinsufflated. The laparoscopic ports were removed under direct visualization and the sites noted to be hemostatic. The Alexis wound protector was removed, counts were reported correct, and we switched to clean instruments, gowns and drapes.   The fascia was then closed using two running #1 PDS sutures. All sponge, needle and instrument counts were reported correct x2.  The skin of all incision sites was closed with 4-0 monocryl subcuticular suture. Dermabond was placed  on the port sites and a sterile dressing was placed over the abdominal incision. The patient was then awakened from anesthesia and sent to the post anesthesia care unit in stable condition.   DISPOSITION: PACU in satisfactory condition

## 2021-05-03 NOTE — Anesthesia Procedure Notes (Addendum)
Procedure Name: Intubation Date/Time: 05/03/2021 12:17 PM Performed by: Lissa Morales, CRNA Pre-anesthesia Checklist: Patient identified, Emergency Drugs available, Suction available and Patient being monitored Patient Re-evaluated:Patient Re-evaluated prior to induction Oxygen Delivery Method: Circle system utilized Preoxygenation: Pre-oxygenation with 100% oxygen Induction Type: IV induction Ventilation: Mask ventilation without difficulty Laryngoscope Size: Glidescope and 4 Grade View: Grade III Tube type: Oral Tube size: 8.0 mm Number of attempts: 3 Airway Equipment and Method: Stylet and Oral airway Placement Confirmation: ETT inserted through vocal cords under direct vision, positive ETCO2 and breath sounds checked- equal and bilateral Secured at: 23 cm Tube secured with: Tape Dental Injury: Teeth and Oropharynx as per pre-operative assessment  Difficulty Due To: Difficulty was anticipated, Difficult Airway- due to anterior larynx and Difficult Airway- due to dentition Comments: Attempted  intubation with Sabra Heck 2 and mac4 by S.  Alford Highland CRNA, glidescope 4 blade with good visualization. ETT passed easily thru cords, front tooth still intact. Very deep larynx

## 2021-05-03 NOTE — Transfer of Care (Signed)
Immediate Anesthesia Transfer of Care Note  Patient: Wyatt Meyer  Procedure(s) Performed: LAPAROSCOPIC RIGHT HEMI COLECTOMY WITH LYSIS OF ADHESIONS AND BILATERAL TAP BLOCK (Right: Abdomen)  Patient Location: PACU  Anesthesia Type:General  Level of Consciousness: awake, alert , oriented and patient cooperative  Airway & Oxygen Therapy: Patient Spontanous Breathing and Patient connected to face mask oxygen  Post-op Assessment: Report given to RN, Post -op Vital signs reviewed and stable and Patient moving all extremities X 4  Post vital signs: stable  Last Vitals:  Vitals Value Taken Time  BP 172/64 05/03/21 1506  Temp    Pulse 72 05/03/21 1508  Resp    SpO2 100 % 05/03/21 1508  Vitals shown include unvalidated device data.  Last Pain:  Vitals:   05/03/21 1032  TempSrc:   PainSc: 0-No pain         Complications: No notable events documented.

## 2021-05-03 NOTE — H&P (Addendum)
CC: Here today for surgery  HPI: 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  He had some fatigue and weakness which prompted lab work and is found to have iron deficiency anemia.  He was referred to GI for his first ever colonoscopy.  This was completed 01/27/21 and demonstrated: 1.  Ulcerated nonobstructing large mass on the ileocecal valve fold.  Multiple biopsies were taken.  Tattooed. 2.  3 mm polyp in the distal sigmoid removed.   Pathology: Pathology demonstrated the ileocecal valve mass to demonstrated adenocarcinoma.  Hyperplastic polyp in the distal sigmoid.   CT abdomen/pelvis with contrast 02/04/21 - no evidence of metastatic disease within the abdomen/pelvis.  3 mm right kidney stone.  No hydro.  No evident mass per se. I personally reviewed the CT scan and there is some suggestion of an ileocecal mass on coronal imaging.  CT chest showed tiny 2 and 6 mm nodule   CEA 01/27/21 - 6.0 (elevated)   He tolerated his bowel prep well - 64 oz Gatorade with Miralax + Dulcolax tabs. Blood sugars have been much better. In under a month he has been able to lower his A1c from 12.5 to 10.5 - reports sugars average 130-140 at home for the last month. He has joined a diabetes support group to help keep himself honest.   PMH: HTN, DM (reports sugars more controlled as of late - upper 100s typically but better than before)  PSH: Robotic prostatectomy 7-8 yrs ago at [facility]  FHx: Denies FHx of colorectal, breast, endometrial, ovarian or cervical cancer  Social: Smokes 1/3 pack of cigarettes per day; 1-2 beers per month; trained as Land; works at [employer]. He is here today with his wife whom is a professor in [place]: A comprehensive 10 system review of systems was completed with the  patient and pertinent findings as noted above  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/23/2016   Hyperlipidemia    on meds   Iron deficiency anemia    on IRON and Geritol   Kidney stone    Klebsiella sepsis ([place]) 03/25/2015   Osteoarthritis 08/11/2012   Peripheral neuropathy 08/11/2012   Prostate cancer ([place])    Smoker    SUI (stress urinary incontinence), male 02/03/2013   Uncontrolled diabetes mellitus with complications ([place]) 0000000   on meds    Past Surgical History:  Procedure Laterality Date   PROSTATE SURGERY  09/25/2011   PROSTATE SURGERY     WISDOM TOOTH EXTRACTION      Family History  Problem Relation Age of Onset   Hyperlipidemia Mother    Diabetes Mother    Heart disease Father    Hyperlipidemia Sister    Thyroid cancer Sister    Thyroid cancer Sister    Esophageal cancer Neg Hx    Rectal cancer Neg Hx    Colon cancer Neg Hx    Stomach cancer Neg Hx    Colon polyps Neg Hx     Social:  reports that he has been smoking cigarettes. He has been smoking an average of .5 packs per day. He has never used smokeless tobacco. He reports current alcohol use. He reports that  he does not use drugs.  Allergies:  Allergies  Allergen Reactions   Codeine Nausea And Vomiting    Medications: I have reviewed the patient's current medications.  Results for orders placed or performed during the hospital encounter of 05/03/21 (from the past 48 hour(s))  Glucose, capillary     Status: Abnormal   Collection Time: 05/03/21 10:27 AM  Result Value Ref Range   Glucose-Capillary 130 (H) 70 - 99 mg/dL    Comment: Glucose reference range applies only to samples taken after fasting for at least 8 hours.  Type and screen [place]     Status: None (Preliminary result)   Collection Time: 05/03/21 10:30 AM  Result Value Ref Range   ABO/RH(D) PENDING    Antibody Screen PENDING     Sample Expiration      05/06/2021,2359 Performed at [facility], [place] [address] [place], [place] [address]     No results found.  ROS - all of the below systems have been reviewed with the patient and positives are indicated with bold text General: chills, fever or night sweats Eyes: blurry vision or double vision ENT: epistaxis or sore throat Allergy/Immunology: itchy/watery eyes or nasal congestion Hematologic/Lymphatic: bleeding problems, blood clots or swollen lymph nodes Endocrine: temperature intolerance or unexpected weight changes Breast: new or changing breast lumps or nipple discharge Resp: cough, shortness of breath, or wheezing CV: chest pain or dyspnea on exertion GI: as per HPI GU: dysuria, trouble voiding, or hematuria MSK: joint pain or joint stiffness Neuro: TIA or stroke symptoms Derm: pruritus and skin lesion changes Psych: anxiety and depression  PE Blood pressure (!) 164/84, pulse 79, temperature 98.1 F (36.7 C), temperature source Oral, resp. rate 15, weight 109.8 kg, SpO2 100 %. Constitutional: NAD; conversant Eyes: Moist conjunctiva; no lid lag; anicteric Neck: Trachea midline; no thyromegaly Lungs: Normal respiratory effort; no tactile fremitus CV: RRR; no palpable thrills; no pitting edema GI: Abd soft, NT/ND MSK: Normal range of motion of extremities Psychiatric: Appropriate affect; alert and oriented x3  Results for orders placed or performed during the hospital encounter of 05/03/21 (from the past 48 hour(s))  Glucose, capillary     Status: Abnormal   Collection Time: 05/03/21 10:27 AM  Result Value Ref Range   Glucose-Capillary 130 (H) 70 - 99 mg/dL    Comment: Glucose reference range applies only to samples taken after fasting for at least 8 hours.  Type and screen [place]     Status: None (Preliminary result)   Collection Time: 05/03/21 10:30 AM  Result Value Ref Range   ABO/RH(D) PENDING     Antibody Screen PENDING    Sample Expiration      05/06/2021,2359 Performed at [facility], [place] [address], [place], [place] [address]     No results found.   A/P: JIAIR JOANIS is an 60 y.o. male with Mr. 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 The  pathophysiology of colon cancer was reviewed with associated pictures. -Assuming CT chest does not show any suspicious masses to suggest metastasis, we reviewed laparoscopic right hemicolectomy and rational -The planned procedure, material risks (including, but not limited to, pain, bleeding, infection, scarring, need for blood transfusion, damage to surrounding structures- blood vessels/nerves/viscus/organs, damage to ureter, urine leak, leak from anastomosis, need for additional procedures, worsening of pre-existing medical conditions, need for stoma which could be permanent, hernia, recurrence, DVT/PE, pneumonia, heart attack, stroke, death) benefits and alternatives to surgery were discussed at length. The patient's and his wife's questions were answered to their satisfaction, they voiced understanding and elected to proceed with surgery. Additionally, we discussed typical postoperative expectations and the recovery process. -We discussed importance of glycemic control beginning today and continuing for the rest of his life. I also strongly recommended he quit smoking and discussed increased risk of  perioperative complications including infectious as well as anastomotic leaks. He expresses understanding of all of this.  Nadeen Landau, MD [facility] Use [email or web address] to contact on call provider

## 2021-05-03 NOTE — Anesthesia Postprocedure Evaluation (Signed)
Anesthesia Post Note  Patient: Wyatt Meyer  Procedure(s) Performed: LAPAROSCOPIC RIGHT HEMI COLECTOMY WITH LYSIS OF ADHESIONS AND BILATERAL TAP BLOCK (Right: Abdomen)     Patient location during evaluation: PACU Anesthesia Type: General Level of consciousness: awake and alert Pain management: pain level controlled Vital Signs Assessment: post-procedure vital signs reviewed and stable Respiratory status: spontaneous breathing, nonlabored ventilation and respiratory function stable Cardiovascular status: blood pressure returned to baseline and stable Postop Assessment: no apparent nausea or vomiting Anesthetic complications: no   No notable events documented.  Last Vitals:  Vitals:   05/03/21 1545 05/03/21 1559  BP: 135/71 (!) 147/80  Pulse: 78 78  Resp: 14 17  Temp:  36.7 C  SpO2: 100% 99%    Last Pain:  Vitals:   05/03/21 1604  TempSrc:   PainSc: [place]

## 2021-05-03 NOTE — Anesthesia Preprocedure Evaluation (Signed)
Anesthesia Evaluation  Patient identified by MRN, date of birth, ID band Patient awake    Reviewed: Allergy & Precautions, NPO status , Patient's Chart, lab work & pertinent test results  Airway Mallampati: II  TM Distance: >3 FB Neck ROM: Full    Dental no notable dental hx.    Pulmonary neg pulmonary ROS, Current Smoker and Patient abstained from smoking.,    Pulmonary exam normal breath sounds clear to auscultation       Cardiovascular negative cardio ROS Normal cardiovascular exam Rhythm:Regular Rate:Normal     Neuro/Psych negative neurological ROS  negative psych ROS   GI/Hepatic negative GI ROS, Neg liver ROS,   Endo/Other  negative endocrine ROSdiabetes, Type 2  Renal/GU negative Renal ROS  negative genitourinary   Musculoskeletal  (+) Arthritis , Osteoarthritis,    Abdominal (+) + obese,   Peds negative pediatric ROS (+)  Hematology negative hematology ROS (+)   Anesthesia Other Findings Colon Cancer  Reproductive/Obstetrics negative OB ROS                             Anesthesia Physical Anesthesia Plan  ASA: 3  Anesthesia Plan: General   Post-op Pain Management:    Induction: Intravenous  PONV Risk Score and Plan: 1 and Ondansetron and Treatment may vary due to age or medical condition  Airway Management Planned: Oral ETT  Additional Equipment:   Intra-op Plan:   Post-operative Plan: Extubation in OR  Informed Consent: I have reviewed the patients History and Physical, chart, labs and discussed the procedure including the risks, benefits and alternatives for the proposed anesthesia with the patient or authorized representative who has indicated his/her understanding and acceptance.     Dental advisory given  Plan Discussed with: CRNA  Anesthesia Plan Comments:         Anesthesia Quick Evaluation

## 2021-05-04 ENCOUNTER — Encounter (HOSPITAL_COMMUNITY): Payer: Self-pay | Admitting: Surgery

## 2021-05-04 LAB — URINALYSIS, ROUTINE W REFLEX MICROSCOPIC
Bilirubin Urine: NEGATIVE
Glucose, UA: 50 mg/dL — AB
Hgb urine dipstick: NEGATIVE
Ketones, ur: NEGATIVE mg/dL
Leukocytes,Ua: NEGATIVE
Nitrite: NEGATIVE
Protein, ur: NEGATIVE mg/dL
Specific Gravity, Urine: 1.01 (ref 1.005–1.030)
pH: 6 (ref 5.0–8.0)

## 2021-05-04 LAB — CBC
HCT: 28.7 % — ABNORMAL LOW (ref 39.0–52.0)
Hemoglobin: 8.6 g/dL — ABNORMAL LOW (ref 13.0–17.0)
MCH: 29 pg (ref 26.0–34.0)
MCHC: 30 g/dL (ref 30.0–36.0)
MCV: 96.6 fL (ref 80.0–100.0)
Platelets: 304 10*3/uL (ref 150–400)
RBC: 2.97 MIL/uL — ABNORMAL LOW (ref 4.22–5.81)
RDW: 14.4 % (ref 11.5–15.5)
WBC: 14.4 10*3/uL — ABNORMAL HIGH (ref 4.0–10.5)
nRBC: 0 % (ref 0.0–0.2)

## 2021-05-04 LAB — GLUCOSE, CAPILLARY
Glucose-Capillary: 181 mg/dL — ABNORMAL HIGH (ref 70–99)
Glucose-Capillary: 170 mg/dL — ABNORMAL HIGH (ref 70–99)
Glucose-Capillary: 186 mg/dL — ABNORMAL HIGH (ref 70–99)
Glucose-Capillary: 192 mg/dL — ABNORMAL HIGH (ref 70–99)

## 2021-05-04 LAB — BASIC METABOLIC PANEL
Anion gap: 9 (ref 5–15)
BUN: 19 mg/dL (ref 8–23)
CO2: 23 mmol/L (ref 22–32)
Calcium: 8.4 mg/dL — ABNORMAL LOW (ref 8.9–10.3)
Chloride: 105 mmol/L (ref 98–111)
Creatinine, Ser: 1.19 mg/dL (ref 0.61–1.24)
GFR, Estimated: >60 mL/min (ref >60)
Glucose, Bld: 200 mg/dL — ABNORMAL HIGH (ref 70–99)
Potassium: 4.9 mmol/L (ref 3.5–5.1)
Sodium: 137 mmol/L (ref 135–145)

## 2021-05-04 MED ORDER — FERROUS SULFATE 325 (65 FE) MG PO TABS
325.0000 mg | ORAL_TABLET | Freq: Every day | ORAL | Status: DC
  Administered 2021-05-05: 325 mg via ORAL
  Filled 2021-05-04: qty 1

## 2021-05-04 NOTE — Progress Notes (Addendum)
  Subjective No acute events. Feeling well. Denies n/v, tolerating clears. Passing flatus, no bm yet. Reports pain is well controlled. States his urine has been foul smelling for a few days preceding surgery and he has requested a urine analysis.  Objective: Vital signs in last 24 hours: Temp:  [97.8 F (36.6 C)-99.4 F (37.4 C)] 98.5 F (36.9 C) (08/11 0529) Pulse Rate:  [73-95] 85 (08/11 0529) Resp:  [13-18] 18 (08/11 0529) BP: (135-171)/(64-87) 138/68 (08/11 0529) SpO2:  [98 %-100 %] 99 % (08/11 0529) Weight:  [109.8 kg] 109.8 kg (08/10 1600) Last BM Date: 05/02/21  Intake/Output from previous day: 08/10 0701 - 08/11 0700 In: 4203.8 [P.O.:1080; I.V.:3023.8; IV Piggyback:100] Out: 3400 [Urine:3300; Blood:100] Intake/Output this shift: No intake/output data recorded.  Gen: NAD, comfortable CV: RRR Pulm: Normal work of breathing Abd: Soft, NT/ND; incision c/d/I without erythema or drainage Ext: SCDs in place  Lab Results: CBC  Recent Labs    05/04/21 0422  WBC 14.4*  HGB 8.6*  HCT 28.7*  PLT 304   BMET Recent Labs    05/04/21 0422  NA 137  K 4.9  CL 105  CO2 23  GLUCOSE 200*  BUN 19  CREATININE 1.19  CALCIUM 8.4*   PT/INR No results for input(s): LABPROT, INR in the last 72 hours. ABG No results for input(s): PHART, HCO3 in the last 72 hours.  Invalid input(s): PCO2, PO2  Studies/Results:  Anti-infectives: Anti-infectives (From admission, onward)    Start     Dose/Rate Route Frequency Ordered Stop   05/03/21 1400  neomycin (MYCIFRADIN) tablet 1,000 mg  Status:  Discontinued       See Hyperspace for full Linked Orders Report.   1,000 mg Oral 3 times per day 05/03/21 1020 05/03/21 1032   05/03/21 1400  metroNIDAZOLE (FLAGYL) tablet 1,000 mg  Status:  Discontinued       See Hyperspace for full Linked Orders Report.   1,000 mg Oral 3 times per day 05/03/21 1020 05/03/21 1032   05/03/21 0600  cefoTEtan (CEFOTAN) 2 g in sodium chloride 0.9 % 100 mL  IVPB        2 g 200 mL/hr over 30 Minutes Intravenous On call to O.R. 05/02/21 0741 05/03/21 1218        Assessment/Plan: Patient Active Problem List   Diagnosis Date Noted   S/P right hemicolectomy 05/03/2021   Colon cancer ([place]) 03/31/2021   Leg edema 04/19/2020   Vitamin B12 deficiency 07/23/2017   Uncontrolled type 2 diabetes mellitus with peripheral neuropathy ([place]) 02/15/2017   Smoker    Erectile dysfunction following radical prostatectomy 10/23/2016   Hyperlipidemia 03/23/2015   History of prostate cancer 03/23/2015   SUI (stress urinary incontinence), male 02/03/2013   Osteoarthritis 08/11/2012   Diabetic neuropathy ([place]) 08/11/2012   s/p Procedure(s): LAPAROSCOPIC RIGHT HEMI COLECTOMY WITH LYSIS OF ADHESIONS AND BILATERAL TAP BLOCK 05/03/2021  -Spent time reviewing his procedure, findings, plans moving forward; answered all of his and his wife's questions. -Bariatric full liquids (no concentrated sweets) advance to soft as tolerated -D/C MIVF -Ambulate 5x/day -Urine analysis as he reports foul smelling urine and dysuria for a few days now that started before he came in for surgery -Ppx: SQH, SCDs   LOS: 1 day   Nadeen Landau, MD [facility] Use [email or web address] to contact on call provider

## 2021-05-04 NOTE — Progress Notes (Signed)
Chaplain engaged in an initial visit with Wyatt Meyer.  He shared that he was not interested in completing Advanced Directive today.  Chaplain shared how to contact her when he is ready.    05/04/21 1500  Clinical Encounter Type  Visited With Patient  Visit Type Initial;Social support

## 2021-05-05 ENCOUNTER — Other Ambulatory Visit (HOSPITAL_COMMUNITY): Payer: Self-pay

## 2021-05-05 LAB — BASIC METABOLIC PANEL
Anion gap: 6 (ref 5–15)
BUN: 21 mg/dL (ref 8–23)
CO2: 25 mmol/L (ref 22–32)
Calcium: 8.4 mg/dL — ABNORMAL LOW (ref 8.9–10.3)
Chloride: 106 mmol/L (ref 98–111)
Creatinine, Ser: 1.13 mg/dL (ref 0.61–1.24)
GFR, Estimated: >60 mL/min (ref >60)
Glucose, Bld: 185 mg/dL — ABNORMAL HIGH (ref 70–99)
Potassium: 4.1 mmol/L (ref 3.5–5.1)
Sodium: 137 mmol/L (ref 135–145)

## 2021-05-05 LAB — CBC
HCT: 27.1 % — ABNORMAL LOW (ref 39.0–52.0)
Hemoglobin: 8 g/dL — ABNORMAL LOW (ref 13.0–17.0)
MCH: 28.6 pg (ref 26.0–34.0)
MCHC: 29.5 g/dL — ABNORMAL LOW (ref 30.0–36.0)
MCV: 96.8 fL (ref 80.0–100.0)
Platelets: 295 10*3/uL (ref 150–400)
RBC: 2.8 MIL/uL — ABNORMAL LOW (ref 4.22–5.81)
RDW: 14.6 % (ref 11.5–15.5)
WBC: 10.8 10*3/uL — ABNORMAL HIGH (ref 4.0–10.5)
nRBC: 0 % (ref 0.0–0.2)

## 2021-05-05 LAB — GLUCOSE, CAPILLARY: Glucose-Capillary: 85 mg/dL (ref 70–99)

## 2021-05-05 MED ORDER — TRAMADOL HCL 50 MG PO TABS: 50.0000 mg | ORAL_TABLET | Freq: Four times a day (QID) | ORAL | 0 refills | Status: AC | PRN

## 2021-05-05 NOTE — Progress Notes (Signed)
Pt alert and oriented. D/C instructions given, pt d/cd to home. 

## 2021-05-05 NOTE — Discharge Summary (Signed)
Patient ID: Wyatt Meyer MRN: [id number] DOB/AGE: Sep 05, 1961 60 y.o.  Admit date: 05/03/2021 Discharge date: 05/05/2021  Discharge Diagnoses Patient Active Problem List   Diagnosis Date Noted   S/P right hemicolectomy 05/03/2021   Colon cancer ([place]) 03/31/2021   Leg edema 04/19/2020   Vitamin B12 deficiency 07/23/2017   Uncontrolled type 2 diabetes mellitus with peripheral neuropathy ([place]) 02/15/2017   Smoker    Erectile dysfunction following radical prostatectomy 10/23/2016   Hyperlipidemia 03/23/2015   History of prostate cancer 03/23/2015   SUI (stress urinary incontinence), male 02/03/2013   Osteoarthritis 08/11/2012   Diabetic neuropathy ([place]) 08/11/2012    Consultants None  Procedures Laparoscopic right hemicolectomy, lysis of adhesions (05/03/21) - for colon cancer of ascending colon.   Hospital Course: He was admitted to the hospital postoperatively and recovered uneventfully. His diet advanced and he began having spontaneous bowel function. On 05/05/21, he was tolerating a diet, moving his bowels, ambulating, voiding and pain well controlled on oral analgesics. He was deemed stable for discharge home  Pathology pending  Allergies as of 05/05/2021       Reactions   Codeine Nausea And Vomiting        Medication List     TAKE these medications    atorvastatin 10 MG tablet Commonly known as: LIPITOR Take 1 tablet (10 mg total) by mouth daily.   BD Pen Needle Nano U/F 32G X 4 MM Misc Generic drug: Insulin Pen Needle use four times a day   FreeStyle Libre 14 Day Reader Kerrin Mo 1 Device by Does not apply route daily. Use to check blood sugars daily   FreeStyle Libre 14 Day Sensor Misc USE AS DIRECTED EVERY 14 DAYS   GERITOL PO Take 15 mLs by mouth daily.   insulin lispro 100 UNIT/ML KwikPen Commonly known as: HumaLOG KwikPen Inject under skin 18-22 units before each meal - 3x a day   NovoLOG FlexPen 100 UNIT/ML FlexPen Generic drug: insulin  aspart ADMINISTER 18 TO 22 UNITS UNDER THE SKIN THREE TIMES DAILY WITH MEALS What changed: See the new instructions.   ONE TOUCH LANCETS Misc Use 3x a day   traMADol 50 MG tablet Commonly known as: Ultram Take 1 tablet (50 mg total) by mouth every 6 (six) hours as needed for up to 5 days.       ASK your doctor about these medications    Iron (Ferrous Sulfate) 325 (65 Fe) MG Tabs Take 1 tablet by mouth every other day. Take WITH Vitamin C   Lantus SoloStar 100 UNIT/ML Solostar Pen Generic drug: insulin glargine Inject 40 Units into the skin at bedtime.   metFORMIN 500 MG 24 hr tablet Commonly known as: GLUCOPHAGE-XR Take 2 tablets (1,000 mg total) by mouth 2 (two) times daily with a meal.   vitamin C 100 MG tablet Take 1 tablet (100 mg total) by mouth every other day. Take WITH Ferrous Sulfate          Follow-up Information     Ileana Roup, MD Follow up.   Specialties: General Surgery, Colon and Rectal Surgery Contact information: [place] [address] [address] [place] Dema Severin, M.D. [facility]

## 2021-05-05 NOTE — Discharge Instructions (Signed)
POST OP INSTRUCTIONS AFTER COLON SURGERY  DIET: Be sure to include lots of fluids daily to stay hydrated - 64oz of water per day (8, 8 oz glasses).  Avoid fast food or heavy meals for the first couple of weeks as your are more likely to get nauseated. Avoid raw/uncooked fruits or vegetables for the first 4 weeks (its ok to have these if they are blended into smoothie form). If you have fruits/vegetables, make sure they are cooked until soft enough to mash on the roof of your mouth and chew your food well. Otherwise, diet as tolerated.  Take your usually prescribed home medications unless otherwise directed.  PAIN CONTROL: Pain is best controlled by a usual combination of three different methods TOGETHER: Ice/Heat Over the counter pain medication Prescription pain medication Most patients will experience some swelling and bruising around the surgical site.  Ice packs or heating pads (30-60 minutes up to 6 times a day) will help. Some people prefer to use ice alone, heat alone, alternating between ice & heat.  Experiment to what works for you.  Swelling and bruising can take several weeks to resolve.   It is helpful to take an over-the-counter pain medication regularly for the first few weeks: Ibuprofen (Motrin/Advil) - 200mg tabs - take 3 tabs (600mg) every 6 hours as needed for pain (unless you have been directed previously to avoid NSAIDs/ibuprofen) Acetaminophen (Tylenol) - you may take 650mg every 6 hours as needed. You can take this with motrin as they act differently on the body. If you are taking a narcotic pain medication that has acetaminophen in it, do not take over the counter tylenol at the same time. NOTE: You may take both of these medications together - most patients  find it most helpful when alternating between the two (i.e. Ibuprofen at 6am, tylenol at 9am, ibuprofen at 12pm ...) A  prescription for pain medication should be given to you upon discharge.  Take your pain medication as  prescribed if your pain is not adequatly controlled with the over-the-counter pain reliefs mentioned above.  Avoid getting constipated.  Between the surgery and the pain medications, it is common to experience some constipation.  Increasing fluid intake and taking a fiber supplement (such as Metamucil, Citrucel, FiberCon, MiraLax, etc) 1-2 times a day regularly will usually help prevent this problem from occurring.  A mild laxative (prune juice, Milk of Magnesia, MiraLax, etc) should be taken according to package directions if there are no bowel movements after 48 hours.    Dressing: Your incisions are covered in Dermabond which is like sterile superglue for the skin. This will come off on it's own in a couple weeks. It is waterproof and you may bathe normally starting the day after your surgery in a shower. Avoid baths/pools/lakes/oceans until your wounds have fully healed.  ACTIVITIES as tolerated:   Avoid heavy lifting (>10lbs or 1 gallon of milk) for the next 6 weeks. You may resume regular daily activities as tolerated--such as daily self-care, walking, climbing stairs--gradually increasing activities as tolerated.  If you can walk 30 minutes without difficulty, it is safe to try more intense activity such as jogging, treadmill, bicycling, low-impact aerobics.  DO NOT PUSH THROUGH PAIN.  Let pain be your guide: If it hurts to do something, don't do it. You may drive when you are no longer taking prescription pain medication, you can comfortably wear a seatbelt, and you can safely maneuver your car and apply brakes.  FOLLOW UP in our   office Please call [facility] at [phone] to set up an appointment to see your surgeon in the office for a follow-up appointment approximately 2 weeks after your surgery. Make sure that you call for this appointment the day you arrive home to insure a convenient appointment time.  9. If you have disability or family leave forms that need to be completed, you may have  them completed by your primary care physician's office; for return to work instructions, please ask our office staff and they will be happy to assist you in obtaining this documentation   When to call us [phone]: Poor pain control Reactions / problems with new medications (rash/itching, etc)  Fever over 101.5 F (38.5 C) Inability to urinate Nausea/vomiting Worsening swelling or bruising Continued bleeding from incision. Increased pain, redness, or drainage from the incision  The clinic staff is available to answer your questions during regular business hours (8:30am-5pm).  Please don't hesitate to call and ask to speak to one of our nurses for clinical concerns.   A surgeon from [facility] is always on call at the hospitals   If you have a medical emergency, go to the nearest emergency room or call 911.  [facility] [address], [address], [place], [place]  [address] MAIN: [phone] FAX: [phone] [email or web address]  

## 2021-05-16 ENCOUNTER — Telehealth: Payer: Self-pay | Admitting: *Deleted

## 2021-05-16 LAB — SURGICAL PATHOLOGY

## 2021-05-16 NOTE — Telephone Encounter (Signed)
Called patient to confirm new patient appointment tomorrow at 1020 w/arrival at 1000. Confirmed office location, parking and visitor and mask policy.

## 2021-05-17 ENCOUNTER — Telehealth: Payer: Self-pay | Admitting: Pharmacy Technician

## 2021-05-17 ENCOUNTER — Telehealth: Payer: Self-pay | Admitting: Oncology

## 2021-05-17 ENCOUNTER — Other Ambulatory Visit (HOSPITAL_COMMUNITY): Payer: Self-pay

## 2021-05-17 ENCOUNTER — Other Ambulatory Visit: Payer: Self-pay

## 2021-05-17 ENCOUNTER — Inpatient Hospital Stay: Payer: BC Managed Care – PPO | Attending: Oncology | Admitting: Oncology

## 2021-05-17 ENCOUNTER — Telehealth: Payer: Self-pay | Admitting: Pharmacist

## 2021-05-17 VITALS — BP 142/74 | HR 84 | Temp 98.1°F | Resp 20 | Ht 73.0 in | Wt 242.6 lb

## 2021-05-17 DIAGNOSIS — C182 Malignant neoplasm of ascending colon: Secondary | ICD-10-CM | POA: Diagnosis not present

## 2021-05-17 MED ORDER — CAPECITABINE 500 MG PO TABS
ORAL_TABLET | ORAL | 0 refills | Status: DC
  Filled 2021-05-17: qty 112, fill #0

## 2021-05-17 NOTE — Progress Notes (Signed)
The proposed treatment discussed in conference is for discussion purpose only and is not a binding recommendation.  The patients have not been physically examined, or presented with their treatment options.  Therefore, final treatment plans cannot be decided.  

## 2021-05-17 NOTE — Telephone Encounter (Signed)
Oral Oncology Pharmacist Encounter  Received new prescription for Xeloda (capecitabine) for the adjuvant treatment of stage IIb colon cancer, planned duration ~ 6 months. Planned start 06/05/21  BMP from 05/05/21 assessed, no relevant lab abnormalities. Prescription dose and frequency assessed.   Current medication list in Epic reviewed, no DDIs with capecitabine identified.  Evaluated chart and no patient barriers to medication adherence identified.   Prescription has been e-scribed to the [facility] for benefits analysis and approval.  Oral Oncology Clinic will continue to follow for insurance authorization, copayment issues, initial counseling and start date.  Patient agreed to treatment on 05/17/21 per MD documentation.  Darl Pikes, PharmD, BCPS, BCOP, CPP Hematology/Oncology Clinical Pharmacist Practitioner [facility] [phone]  05/17/2021 5:08 PM

## 2021-05-17 NOTE — Progress Notes (Signed)
[facility] Patient Consult   Requesting MD: Vivi Barrack, Md [place],  [place] [address]   Wyatt Meyer 60 y.o.  09-10-1961    Reason for Consult: Colon cancer   HPI: Wyatt Meyer reports malaise for the past 8 months to 1 year.  He was found to have anemia and iron deficiency by his primary provider in 2021.  He was referred to Dr. Tarri Glenn and was taken to a colonoscopy on 11/01/2020.  The colon preparation was inadequate with stool in the entire examined colon. He underwent a repeat colonoscopy on 01/27/2021.  An ulcerated mass was found at the ileocecal valve.  Multiple biopsies were obtained.  A 3 mm polyp was removed from the distal sigmoid colon. The pathology from the ascending colon biopsy revealed adenocarcinoma.  The sigmoid polyp returned as a hyperplastic polyp.  He underwent a CT of the abdomen pelvis on 02/03/2021.  There is a probable fatty liver, no adenopathy, no bowel obstruction, moderate stool throughout the colon.  A CT chest on 02/15/2021 revealed a 2 mm nodule in the right middle lobe and a 6 mm fissural nodule in the left upper lobe.  The nodules were felt to most likely be benign.  Wyatt Meyer was referred to Dr. Dema Severin.  He was taken to the operating room for a laparoscopic right hemicolectomy on 05/03/2021.  There were extensive adhesions.  No evidence of metastatic disease.  A tattoo was identified in the proximal ascending colon.  The pathology revealed a moderately differentiated adenocarcinoma of the cecum.  No macroscopic tumor perforation.  No lymphovascular or perineural invasion.  Tumor extends to the subserosal connective tissue with microscopic involvement of the serosa.  19 lymph nodes were negative for metastatic disease.  No tumor deposits.  The tumor returned MSI stable with no loss of mismatch repair protein expression.   He is referred for oncology evaluation.  He reports feeling much better following surgery.  Past  Medical History:  Diagnosis Date   Anemia-iron deficiency 2021   on meds   Erectile dysfunction following radical prostatectomy 10/23/2016   Hyperlipidemia    on meds   Iron deficiency anemia    on IRON and Geritol   Kidney stone    Klebsiella sepsis ([place]) 03/25/2015   Osteoarthritis 08/11/2012   Peripheral neuropathy 08/11/2012   Prostate cancer ([place])    Smoker    SUI (stress urinary incontinence), male 02/03/2013   Uncontrolled diabetes mellitus with complications ([place]) 5/91/6384   on meds    Past Surgical History:  Procedure Laterality Date   LAPAROSCOPIC RIGHT HEMI COLECTOMY Right 05/03/2021   Procedure: LAPAROSCOPIC RIGHT HEMI COLECTOMY WITH LYSIS OF ADHESIONS AND BILATERAL TAP BLOCK;  Surgeon: Wyatt Roup, MD;  Location: [facility];  Service: General;  Laterality: Right;   PROSTATE SURGERY  09/25/2011   PROSTATE SURGERY     WISDOM TOOTH EXTRACTION      Medications: Reviewed  Allergies:  Allergies  Allergen Reactions   Codeine Nausea And Vomiting    Family history: His sister had thyroid cancer.  No other family history of cancer.  He has 3 daughters.  Social History:   He lives with his wife in [place].  He works at a [employer].  He is a [place].  He has smoked cigarettes since he was a teenager and quit 3 weeks ago.  Rare alcohol use.  No transfusion history.  No risk factor for HIV or hepatitis.  He has  received COVID-19 vaccines.  ROS:   Positives include: Fatigue and exertional dyspnea prior to surgery, left ankle/foot swelling for several months that improves with elevation  A complete ROS was otherwise negative.  Physical Exam:  Blood pressure (!) 142/74, pulse 84, temperature 98.1 F (36.7 C), temperature source Oral, resp. rate 20, height 6' 1" (1.854 m), weight 242 lb 9.6 oz (110 kg), SpO2 100 %.  HEENT: Oropharynx without visible mass, neck without mass Lungs: Clear bilaterally Cardiac: Regular rate and rhythm Abdomen: No  hepatosplenomegaly, healing midline incision with glue in place, firm fullness in the right lateral abdomen (he reports this has been present chronically and an area where he takes insulin injections) GU: Testes without mass Vascular: No leg edema Lymph nodes: No cervical, supraclavicular, axillary, or inguinal nodes Neurologic: The motor exam appears intact in the upper and lower extremities bilaterally Skin: No rash Musculoskeletal: No spine tenderness   LAB:  CBC  Lab Results  Component Value Date   WBC 10.8 (H) 05/05/2021   HGB 8.0 (L) 05/05/2021   HCT 27.1 (L) 05/05/2021   MCV 96.8 05/05/2021   PLT 295 05/05/2021   NEUTROABS 5.6 04/18/2021        CMP  Lab Results  Component Value Date   NA 137 05/05/2021   K 4.1 05/05/2021   CL 106 05/05/2021   CO2 25 05/05/2021   GLUCOSE 185 (H) 05/05/2021   BUN 21 05/05/2021   CREATININE 1.13 05/05/2021   CALCIUM 8.4 (L) 05/05/2021   PROT 7.7 04/18/2021   ALBUMIN 3.9 04/18/2021   AST 24 04/18/2021   ALT 21 04/18/2021   ALKPHOS 94 04/18/2021   BILITOT 0.4 04/18/2021   GFRNONAA >60 05/05/2021   GFRAA 84 04/04/2016     Imaging: CT images is reviewed    Assessment/Plan:   Colon cancer, cecum, stage IIb (T4a N0 M0), status post a laparoscopic right colectomy 05/03/2021 0/19 lymph nodes, no perineural or lymphovascular invasion, negative resection margins, no tumor deposits, tumor extends into subserosal connective tissue with microscopic involvement of the serosa, MSS, no loss of mismatch repair protein expression CT the abdomen/pelvis 02/03/2021-no bowel obstruction, no adenopathy, probable fatty liver CT chest five 2422-2 mm right middle lobe and 6 mm left upper lobe fissural nodule, nonspecific Elevated operative CEA  Iron deficiency anemia secondary to #1 Diabetes Peripheral neuropathy History of prostate cancer, status post prostatectomy 08/29/2012, pT2c, N0, MX, Gleason 7   Disposition:   Wyatt Meyer has been  diagnosed with colon cancer.  I reviewed the prognosis and the details of the surgery pathology report with him today.  His sister was present for today's visit.  His wife was present by telephone.  He has a good prognosis for long-term disease-free survival.  We discussed the indication for adjuvant systemic therapy in patients with resected stage II colon cancer.  The tumor was staged as a T4 lesion.  This is a high risk feature in patients with resected stage II colon cancer.  I described the expected benefit with adjuvant systemic therapy.  I recommend adjuvant capecitabine.  We will submit peripheral blood testing for circulating tumor DNA at 4 weeks following surgery.  We reviewed potential toxicities associated with capecitabine including the chance for nausea, mucositis, diarrhea, and hematologic toxicity.  We discussed the sun sensitivity, rash, hyperpigmentation, and hand/foot syndrome associated with capecitabine.  He agrees to proceed.  Wyatt but they does not appear to have hereditary 9 polyposis colon cancer syndrome, but his family members are  at increased risk of developing colorectal cancer and should receive appropriate screening.  Betsy Coder, MD  05/17/2021, 3:11 PM

## 2021-05-17 NOTE — Telephone Encounter (Signed)
Scheduled appts per 8/24 los. Pt aware.

## 2021-05-17 NOTE — Telephone Encounter (Signed)
Oral Oncology Patient Advocate Encounter   Received notification from [facility] that prior authorization for Xeloda is required.   PA submitted on CoverMyMeds Key [id number] Status is pending   Oral Oncology Clinic will continue to follow.  [place] Patient [place] Phone [phone] Fax [phone] 05/17/2021 4:23 PM

## 2021-05-18 ENCOUNTER — Other Ambulatory Visit (HOSPITAL_COMMUNITY): Payer: Self-pay

## 2021-05-18 MED ORDER — CAPECITABINE 500 MG PO TABS
2000.0000 mg | ORAL_TABLET | Freq: Two times a day (BID) | ORAL | 0 refills | Status: DC
  Filled 2021-05-18: qty 112, 14d supply, fill #0
  Filled 2021-05-25: qty 112, 21d supply, fill #0

## 2021-05-18 NOTE — Telephone Encounter (Signed)
Oral Oncology Patient Advocate Encounter  Prior Authorization for Xeloda has been approved.    PA# [id number] Effective dates: 05/17/21 through 05/17/22  Patients co-pay is $0.00  Oral Oncology Clinic will continue to follow.   [place] Patient [place] Phone [phone] Fax [phone] 05/18/2021 9:50 AM

## 2021-05-19 ENCOUNTER — Encounter: Payer: Self-pay | Admitting: Pharmacist

## 2021-05-19 NOTE — Telephone Encounter (Signed)
Oral Chemotherapy Student Pharmacist Encounter  Patient Education I spoke with patient for overview of new oral chemotherapy medication: Xeloda (capecitabine) for the treatment of Stage IIB colon cancer, planned duration is ~6 months.   Pt is doing well. Counseled patient on administration, dosing, side effects, monitoring, drug-food interactions, safe handling, storage, and disposal. Patient will take 4 tablets (2,000 mg total) by mouth 2 (two) times daily after a meal. He will take for 14 days, then hold for 7 days, and repeat every 21 days.  Side effects include but not limited to: Diarrhea, mouth sores, hand-foot syndrome, N/V, fatigue, decreased WBC/PLT/Hgb, elevated LFTs. Diarrhea: Recommended patient to take Imodium at the first loose stool and contact the cancer center if diarrhea persists to 4 or more loose stools while on Imodium. Advised patient to drink water or Gatorade to stay hydrated and replenish electrolytes with diarrhea. Mouth sores: Patient reported using regular mouthwash at home. Recommended patient to switch to an alcohol-free product and use gentle toothbrush to prevent mouth irritation. Let patient know that he can contact the office to receive a prescription for the Magic Mouthwash when mouth sores develop. HFS: Recommended patient to keep hands and feet moisturized to prevent HFS.  Fatigue: Patient reported noticeable fatigue from anemia. Encouraged him to engage in physical activity daily (e.g. taking walks around the neighborhood) and rest as needed. We will be closely monitoring his Hgb levels.  Reviewed with patient importance of keeping a medication schedule and plan for any missed doses.  After discussion with patient, he had some concerns about affordability. Reassured patient that his co-pay for this medication will be $0.  Mr. Madrazo voiced understanding and appreciation. All questions answered. Medication handout provided.  Provided patient with Oral  [facility] phone number. Patient knows to call the office with questions or concerns. Oral Chemotherapy Navigation Clinic will continue to follow.  Wynelle Cleveland, PharmD Candidate [facility] [phone]  05/19/2021 4:26 PM

## 2021-05-22 ENCOUNTER — Other Ambulatory Visit (HOSPITAL_COMMUNITY): Payer: Self-pay

## 2021-05-22 ENCOUNTER — Encounter: Payer: Self-pay | Admitting: Nurse Practitioner

## 2021-05-22 ENCOUNTER — Telehealth: Payer: Self-pay | Admitting: *Deleted

## 2021-05-22 NOTE — Telephone Encounter (Signed)
Patient dropped off jury summons letter for Dr. Benay Spice to dictate excuse since he will be starting oral chemotherapy soon. Also concerned that he was told he will take the xeloda for 6 months. Confirmed this w/him, but explained that it will be taken in cycles. He will take it bid x 14 days on, then 7 rest. Repeat cycle every 21 days. He verbalized understanding and feels better now. Asking if he can be around others when on chemo--informed him others should avoid touching his stool or urine.

## 2021-05-25 ENCOUNTER — Other Ambulatory Visit (HOSPITAL_COMMUNITY): Payer: Self-pay

## 2021-05-25 NOTE — Telephone Encounter (Signed)
Oral Oncology Patient Advocate Encounter  I spoke with Wyatt Meyer this morning to set up delivery of Xeloda.  Address verified for shipment.  Xeloda will be filled through [facility] and mailed 05/30/21 for delivery 05/31/21.  Patient is aware that he will not start treatment until 06/05/21.    [place] will call 7-10 days before next refill is due to complete adherence call and set up delivery of medication.     [place] Patient [place] Phone [phone] Fax [phone] 05/25/2021 2:12 PM

## 2021-05-25 NOTE — Telephone Encounter (Signed)
Oral Chemotherapy Pharmacist Encounter   [facility] will delivery the Xeloda on 05/31/21.    Darl Pikes, PharmD, BCPS, BCOP, CPP Hematology/Oncology Clinical Pharmacist [facility] [phone]  05/25/2021 11:00 AM

## 2021-05-30 ENCOUNTER — Other Ambulatory Visit (HOSPITAL_COMMUNITY): Payer: Self-pay

## 2021-06-05 ENCOUNTER — Other Ambulatory Visit: Payer: Self-pay

## 2021-06-05 ENCOUNTER — Encounter: Payer: Self-pay | Admitting: Nurse Practitioner

## 2021-06-05 ENCOUNTER — Inpatient Hospital Stay: Payer: BC Managed Care – PPO | Attending: Oncology

## 2021-06-05 ENCOUNTER — Inpatient Hospital Stay (HOSPITAL_BASED_OUTPATIENT_CLINIC_OR_DEPARTMENT_OTHER): Payer: BC Managed Care – PPO | Admitting: Nurse Practitioner

## 2021-06-05 VITALS — BP 141/73 | HR 87 | Temp 97.8°F | Resp 18 | Ht 73.0 in | Wt 243.6 lb

## 2021-06-05 DIAGNOSIS — E119 Type 2 diabetes mellitus without complications: Secondary | ICD-10-CM | POA: Insufficient documentation

## 2021-06-05 DIAGNOSIS — Z79899 Other long term (current) drug therapy: Secondary | ICD-10-CM | POA: Insufficient documentation

## 2021-06-05 DIAGNOSIS — G629 Polyneuropathy, unspecified: Secondary | ICD-10-CM | POA: Insufficient documentation

## 2021-06-05 DIAGNOSIS — D509 Iron deficiency anemia, unspecified: Secondary | ICD-10-CM | POA: Diagnosis not present

## 2021-06-05 DIAGNOSIS — C182 Malignant neoplasm of ascending colon: Secondary | ICD-10-CM

## 2021-06-05 DIAGNOSIS — Z9079 Acquired absence of other genital organ(s): Secondary | ICD-10-CM | POA: Insufficient documentation

## 2021-06-05 DIAGNOSIS — Z9049 Acquired absence of other specified parts of digestive tract: Secondary | ICD-10-CM | POA: Insufficient documentation

## 2021-06-05 DIAGNOSIS — K5900 Constipation, unspecified: Secondary | ICD-10-CM | POA: Insufficient documentation

## 2021-06-05 DIAGNOSIS — R911 Solitary pulmonary nodule: Secondary | ICD-10-CM | POA: Diagnosis not present

## 2021-06-05 DIAGNOSIS — Z8546 Personal history of malignant neoplasm of prostate: Secondary | ICD-10-CM | POA: Diagnosis not present

## 2021-06-05 DIAGNOSIS — C180 Malignant neoplasm of cecum: Secondary | ICD-10-CM | POA: Diagnosis present

## 2021-06-05 LAB — CMP (CANCER CENTER ONLY)
ALT: 15 U/L (ref 0–44)
AST: 17 U/L (ref 15–41)
Albumin: 3.8 g/dL (ref 3.5–5.0)
Alkaline Phosphatase: 74 U/L (ref 38–126)
Anion gap: 6 (ref 5–15)
BUN: 14 mg/dL (ref 8–23)
CO2: 26 mmol/L (ref 22–32)
Calcium: 8.9 mg/dL (ref 8.9–10.3)
Chloride: 104 mmol/L (ref 98–111)
Creatinine: 1.34 mg/dL — ABNORMAL HIGH (ref 0.61–1.24)
GFR, Estimated: 60 mL/min — ABNORMAL LOW (ref >60)
Glucose, Bld: 226 mg/dL — ABNORMAL HIGH (ref 70–99)
Potassium: 4.3 mmol/L (ref 3.5–5.1)
Sodium: 136 mmol/L (ref 135–145)
Total Bilirubin: 0.3 mg/dL (ref 0.3–1.2)
Total Protein: 7.2 g/dL (ref 6.5–8.1)

## 2021-06-05 LAB — CBC WITH DIFFERENTIAL (CANCER CENTER ONLY)
Abs Immature Granulocytes: 0.01 10*3/uL (ref 0.00–0.07)
Basophils Absolute: 0.1 10*3/uL (ref 0.0–0.1)
Basophils Relative: 1 %
Eosinophils Absolute: 0.1 10*3/uL (ref 0.0–0.5)
Eosinophils Relative: 2 %
HCT: 32.1 % — ABNORMAL LOW (ref 39.0–52.0)
Hemoglobin: 9.4 g/dL — ABNORMAL LOW (ref 13.0–17.0)
Immature Granulocytes: 0 %
Lymphocytes Relative: 27 %
Lymphs Abs: 2 10*3/uL (ref 0.7–4.0)
MCH: 26.7 pg (ref 26.0–34.0)
MCHC: 29.3 g/dL — ABNORMAL LOW (ref 30.0–36.0)
MCV: 91.2 fL (ref 80.0–100.0)
Monocytes Absolute: 0.8 10*3/uL (ref 0.1–1.0)
Monocytes Relative: 11 %
Neutro Abs: 4.2 10*3/uL (ref 1.7–7.7)
Neutrophils Relative %: 59 %
Platelet Count: 264 10*3/uL (ref 150–400)
RBC: 3.52 MIL/uL — ABNORMAL LOW (ref 4.22–5.81)
RDW: 14.1 % (ref 11.5–15.5)
WBC Count: 7.2 10*3/uL (ref 4.0–10.5)
nRBC: 0 % (ref 0.0–0.2)

## 2021-06-05 LAB — CEA (ACCESS): CEA (CHCC): 4.3 ng/mL (ref 0.00–5.00)

## 2021-06-05 NOTE — Progress Notes (Signed)
  [facility] OFFICE PROGRESS NOTE   Diagnosis: Colon cancer  INTERVAL HISTORY:   [name] returns as scheduled.  He denies bleeding.  He is taking oral iron every other day.  Some constipation.  Objective:  Vital signs in last 24 hours:  Blood pressure (!) 141/73, pulse 87, temperature 97.8 F (36.6 C), resp. rate 18, height _0  (1.854 m), weight 243 lb 9.6 oz (110.5 kg), SpO2 100 %.    HEENT: No thrush or ulcers. Resp: Lungs clear bilaterally. Cardio: Regular rate and rhythm. GI: Abdomen soft and nontender.  No hepatomegaly.  Healed surgical incisions. Vascular: No leg edema. Skin: Palms without erythema.   Lab Results:  Lab Results  Component Value Date   WBC 7.2 06/05/2021   HGB 9.4 (L) 06/05/2021   HCT 32.1 (L) 06/05/2021   MCV 91.2 06/05/2021   PLT 264 06/05/2021   NEUTROABS 4.2 06/05/2021    Imaging:  No results found.  Medications: I have reviewed the patient's current medications.  Assessment/Plan: Colon cancer, cecum, stage IIb (T4a N0 M0), status post a laparoscopic right colectomy 05/03/2021 0/19 lymph nodes, no perineural or lymphovascular invasion, negative resection margins, no tumor deposits, tumor extends into subserosal connective tissue with microscopic involvement of the serosa, MSS, no loss of mismatch repair protein expression CT the abdomen/pelvis 02/03/2021-no bowel obstruction, no adenopathy, probable fatty liver CT chest 02/14/2021-2 mm right middle lobe and 6 mm left upper lobe fissural nodule, nonspecific Elevated operative CEA, repeat CEA 06/05/2021-4.3 Cycle 1 adjuvant Xeloda 06/05/2021   Iron deficiency anemia secondary to #1 Diabetes Peripheral neuropathy History of prostate cancer, status post prostatectomy 08/29/2012, pT2c, N0, MX, Gleason 7  Disposition: [name] appears stable.  We again reviewed potential toxicities associated with Xeloda.  He plans to begin cycle 1 today as scheduled.  We reviewed the CBC  from today.  Hemoglobin is better.  He will increase oral iron to once daily and begin a stool softener if he develops constipation.  He will return for lab and follow-up on 06/23/2021.  We are available to see him sooner if needed.  Patient seen with Dr. Benay Spice.    Ned Card ANP/GNP-BC   06/05/2021  8:45 AM This was a shared visit with Ned Card. Mr Wyatt Meyer will begin adjuvant capecitabine today.  We discussed management of iron deficiency anemia.  The CEA has normalized.  We will follow-up on peripheral blood circulating tumor DNA from today.  No I was present for greater than 50% of today's visit.  I performed medical decision making.  [name], MD

## 2021-06-14 ENCOUNTER — Other Ambulatory Visit: Payer: Self-pay | Admitting: Oncology

## 2021-06-14 ENCOUNTER — Other Ambulatory Visit (HOSPITAL_COMMUNITY): Payer: Self-pay

## 2021-06-14 DIAGNOSIS — C182 Malignant neoplasm of ascending colon: Secondary | ICD-10-CM

## 2021-06-15 ENCOUNTER — Other Ambulatory Visit (HOSPITAL_COMMUNITY): Payer: Self-pay

## 2021-06-15 MED ORDER — CAPECITABINE 500 MG PO TABS
2000.0000 mg | ORAL_TABLET | Freq: Two times a day (BID) | ORAL | 0 refills | Status: DC
  Filled 2021-06-15: qty 112, 21d supply, fill #0

## 2021-06-19 LAB — GUARDANT 360

## 2021-06-20 ENCOUNTER — Other Ambulatory Visit (HOSPITAL_COMMUNITY): Payer: Self-pay

## 2021-06-23 ENCOUNTER — Other Ambulatory Visit: Payer: Self-pay

## 2021-06-23 ENCOUNTER — Inpatient Hospital Stay: Payer: BC Managed Care – PPO

## 2021-06-23 ENCOUNTER — Inpatient Hospital Stay (HOSPITAL_BASED_OUTPATIENT_CLINIC_OR_DEPARTMENT_OTHER): Payer: BC Managed Care – PPO | Admitting: Oncology

## 2021-06-23 VITALS — BP 133/65 | HR 70 | Temp 98.7°F | Resp 18 | Ht 73.0 in | Wt 238.0 lb

## 2021-06-23 DIAGNOSIS — C180 Malignant neoplasm of cecum: Secondary | ICD-10-CM | POA: Diagnosis not present

## 2021-06-23 DIAGNOSIS — C182 Malignant neoplasm of ascending colon: Secondary | ICD-10-CM

## 2021-06-23 LAB — CMP (CANCER CENTER ONLY)
ALT: 15 U/L (ref 0–44)
AST: 19 U/L (ref 15–41)
Albumin: 4.2 g/dL (ref 3.5–5.0)
Alkaline Phosphatase: 96 U/L (ref 38–126)
Anion gap: 8 (ref 5–15)
BUN: 16 mg/dL (ref 8–23)
CO2: 27 mmol/L (ref 22–32)
Calcium: 9.2 mg/dL (ref 8.9–10.3)
Chloride: 104 mmol/L (ref 98–111)
Creatinine: 1.27 mg/dL — ABNORMAL HIGH (ref 0.61–1.24)
GFR, Estimated: >60 mL/min (ref >60)
Glucose, Bld: 231 mg/dL — ABNORMAL HIGH (ref 70–99)
Potassium: 4.1 mmol/L (ref 3.5–5.1)
Sodium: 139 mmol/L (ref 135–145)
Total Bilirubin: 0.3 mg/dL (ref 0.3–1.2)
Total Protein: 7.7 g/dL (ref 6.5–8.1)

## 2021-06-23 LAB — CBC WITH DIFFERENTIAL (CANCER CENTER ONLY)
Abs Immature Granulocytes: 0.02 10*3/uL (ref 0.00–0.07)
Basophils Absolute: 0 10*3/uL (ref 0.0–0.1)
Basophils Relative: 0 %
Eosinophils Absolute: 0.2 10*3/uL (ref 0.0–0.5)
Eosinophils Relative: 2 %
HCT: 33.8 % — ABNORMAL LOW (ref 39.0–52.0)
Hemoglobin: 10.1 g/dL — ABNORMAL LOW (ref 13.0–17.0)
Immature Granulocytes: 0 %
Lymphocytes Relative: 33 %
Lymphs Abs: 2.2 10*3/uL (ref 0.7–4.0)
MCH: 27.5 pg (ref 26.0–34.0)
MCHC: 29.9 g/dL — ABNORMAL LOW (ref 30.0–36.0)
MCV: 92.1 fL (ref 80.0–100.0)
Monocytes Absolute: 0.7 10*3/uL (ref 0.1–1.0)
Monocytes Relative: 10 %
Neutro Abs: 3.6 10*3/uL (ref 1.7–7.7)
Neutrophils Relative %: 55 %
Platelet Count: 291 10*3/uL (ref 150–400)
RBC: 3.67 MIL/uL — ABNORMAL LOW (ref 4.22–5.81)
RDW: 16.5 % — ABNORMAL HIGH (ref 11.5–15.5)
WBC Count: 6.6 10*3/uL (ref 4.0–10.5)
nRBC: 0 % (ref 0.0–0.2)

## 2021-06-23 NOTE — Progress Notes (Addendum)
  [place] OFFICE PROGRESS NOTE   Diagnosis: Colon cancer  INTERVAL HISTORY:   Mr Coye completed a cycle of Xeloda beginning 06/05/2021.  No mouth sores, nausea, diarrhea, or hand/foot pain.  He relates weight loss to exercise and a change of his diet.  Objective:  Vital signs in last 24 hours:  Blood pressure 133/65, pulse 70, temperature 98.7 F (37.1 C), temperature source Oral, resp. rate 18, height $RemoveBe'6\' 1"'PosJJbhYK$  (1.854 m), weight 238 lb (108 kg), SpO2 100 %.    HEENT: No thrush or ulcers Resp: Lungs clear bilaterally Cardio: Regular rate and rhythm GI: No hepatosplenomegaly, nontender, healed midline incision Vascular: No leg edema  Skin: Dryness of the palms and soles, no erythema or skin breakdown    Lab Results:  Lab Results  Component Value Date   WBC 6.6 06/23/2021   HGB 10.1 (L) 06/23/2021   HCT 33.8 (L) 06/23/2021   MCV 92.1 06/23/2021   PLT 291 06/23/2021   NEUTROABS 3.6 06/23/2021    CMP  Lab Results  Component Value Date   NA 139 06/23/2021   K 4.1 06/23/2021   CL 104 06/23/2021   CO2 27 06/23/2021   GLUCOSE 231 (H) 06/23/2021   BUN 16 06/23/2021   CREATININE 1.27 (H) 06/23/2021   CALCIUM 9.2 06/23/2021   PROT 7.7 06/23/2021   ALBUMIN 4.2 06/23/2021   AST 19 06/23/2021   ALT 15 06/23/2021   ALKPHOS 96 06/23/2021   BILITOT 0.3 06/23/2021   GFRNONAA >60 06/23/2021   GFRAA 84 04/04/2016    Lab Results  Component Value Date   CEA 4.30 06/05/2021     Medications: I have reviewed the patient's current medications.   Assessment/Plan: Colon cancer, cecum, stage IIb (T4a N0 M0), status post a laparoscopic right colectomy 05/03/2021 0/19 lymph nodes, no perineural or lymphovascular invasion, negative resection margins, no tumor deposits, tumor extends into subserosal connective tissue with microscopic involvement of the serosa, MSS, no loss of mismatch repair protein expression CT the abdomen/pelvis 02/03/2021-no bowel obstruction,  no adenopathy, probable fatty liver CT chest 02/14/2021-2 mm right middle lobe and 6 mm left upper lobe fissural nodule, nonspecific Elevated preoperative CEA, repeat CEA 06/05/2021-4.3 Guardant Reveal 06/05/2021-negative Cycle 1 adjuvant Xeloda 06/05/2021 Cycle 2 Xeloda 06/26/2021   Iron deficiency anemia secondary to #1 Diabetes Peripheral neuropathy History of prostate cancer, status post prostatectomy 08/29/2012, pT2c, N0, MX, Gleason 7    Disposition: Mr Gildersleeve tolerated the first cycle of Xeloda well.  He will complete cycle 2 beginning 06/26/2021.  He will return for an office and lab visit in 3 weeks.  Betsy Coder, MD  06/23/2021  9:09 AM

## 2021-07-10 ENCOUNTER — Other Ambulatory Visit: Payer: Self-pay | Admitting: Oncology

## 2021-07-10 ENCOUNTER — Other Ambulatory Visit (HOSPITAL_COMMUNITY): Payer: Self-pay

## 2021-07-10 DIAGNOSIS — C182 Malignant neoplasm of ascending colon: Secondary | ICD-10-CM

## 2021-07-12 NOTE — Telephone Encounter (Signed)
Pt has appointment with Lattie Haw on 07/14/21

## 2021-07-14 ENCOUNTER — Inpatient Hospital Stay: Payer: BC Managed Care – PPO | Attending: Oncology

## 2021-07-14 ENCOUNTER — Inpatient Hospital Stay (HOSPITAL_BASED_OUTPATIENT_CLINIC_OR_DEPARTMENT_OTHER): Payer: BC Managed Care – PPO | Admitting: Nurse Practitioner

## 2021-07-14 ENCOUNTER — Other Ambulatory Visit: Payer: Self-pay

## 2021-07-14 ENCOUNTER — Encounter: Payer: Self-pay | Admitting: Nurse Practitioner

## 2021-07-14 VITALS — BP 137/78 | HR 89 | Temp 97.8°F | Resp 18 | Ht 73.0 in | Wt 243.0 lb

## 2021-07-14 DIAGNOSIS — Z8546 Personal history of malignant neoplasm of prostate: Secondary | ICD-10-CM | POA: Insufficient documentation

## 2021-07-14 DIAGNOSIS — C182 Malignant neoplasm of ascending colon: Secondary | ICD-10-CM | POA: Diagnosis not present

## 2021-07-14 DIAGNOSIS — G629 Polyneuropathy, unspecified: Secondary | ICD-10-CM | POA: Diagnosis not present

## 2021-07-14 DIAGNOSIS — D500 Iron deficiency anemia secondary to blood loss (chronic): Secondary | ICD-10-CM | POA: Insufficient documentation

## 2021-07-14 DIAGNOSIS — C180 Malignant neoplasm of cecum: Secondary | ICD-10-CM | POA: Insufficient documentation

## 2021-07-14 DIAGNOSIS — E119 Type 2 diabetes mellitus without complications: Secondary | ICD-10-CM | POA: Diagnosis not present

## 2021-07-14 LAB — CBC WITH DIFFERENTIAL (CANCER CENTER ONLY)
Abs Immature Granulocytes: 0.01 10*3/uL (ref 0.00–0.07)
Basophils Absolute: 0 10*3/uL (ref 0.0–0.1)
Basophils Relative: 0 %
Eosinophils Absolute: 0.1 10*3/uL (ref 0.0–0.5)
Eosinophils Relative: 1 %
HCT: 34 % — ABNORMAL LOW (ref 39.0–52.0)
Hemoglobin: 10.4 g/dL — ABNORMAL LOW (ref 13.0–17.0)
Immature Granulocytes: 0 %
Lymphocytes Relative: 35 %
Lymphs Abs: 2.3 10*3/uL (ref 0.7–4.0)
MCH: 28 pg (ref 26.0–34.0)
MCHC: 30.6 g/dL (ref 30.0–36.0)
MCV: 91.6 fL (ref 80.0–100.0)
Monocytes Absolute: 0.7 10*3/uL (ref 0.1–1.0)
Monocytes Relative: 11 %
Neutro Abs: 3.3 10*3/uL (ref 1.7–7.7)
Neutrophils Relative %: 53 %
Platelet Count: 222 10*3/uL (ref 150–400)
RBC: 3.71 MIL/uL — ABNORMAL LOW (ref 4.22–5.81)
RDW: 18.5 % — ABNORMAL HIGH (ref 11.5–15.5)
WBC Count: 6.4 10*3/uL (ref 4.0–10.5)
nRBC: 0 % (ref 0.0–0.2)

## 2021-07-14 LAB — CMP (CANCER CENTER ONLY)
ALT: 17 U/L (ref 0–44)
AST: 22 U/L (ref 15–41)
Albumin: 3.9 g/dL (ref 3.5–5.0)
Alkaline Phosphatase: 95 U/L (ref 38–126)
Anion gap: 7 (ref 5–15)
BUN: 17 mg/dL (ref 8–23)
CO2: 26 mmol/L (ref 22–32)
Calcium: 9.1 mg/dL (ref 8.9–10.3)
Chloride: 103 mmol/L (ref 98–111)
Creatinine: 1.18 mg/dL (ref 0.61–1.24)
GFR, Estimated: >60 mL/min (ref >60)
Glucose, Bld: 194 mg/dL — ABNORMAL HIGH (ref 70–99)
Potassium: 4.2 mmol/L (ref 3.5–5.1)
Sodium: 136 mmol/L (ref 135–145)
Total Bilirubin: 0.4 mg/dL (ref 0.3–1.2)
Total Protein: 7.1 g/dL (ref 6.5–8.1)

## 2021-07-14 NOTE — Progress Notes (Signed)
  [place] OFFICE PROGRESS NOTE   Diagnosis: Colon cancer  INTERVAL HISTORY:   Mr. Viverette returns as scheduled.  He began cycle 2 adjuvant Xeloda 06/26/2021.  He has continued taking Xeloda beyond 14 days, last dose this morning.  He denies nausea/vomiting.  No mouth sores.  No diarrhea.  Toes feel tight.  He notes lips appear darker.  Objective:  Vital signs in last 24 hours:  Blood pressure 137/78, pulse 89, temperature 97.8 F (36.6 C), temperature source Oral, resp. rate 18, height _0  (1.854 m), weight 243 lb (110.2 kg), SpO2 (!) 10 %.    HEENT: No thrush or ulcers. Resp: Lungs clear bilaterally. Cardio: Regular rate and rhythm. GI: Abdomen soft and nontender.  No hepatomegaly. Vascular: No leg edema. Skin: Palms and soles dry appearing.  Lips, tongue with hyperpigmentation.   Lab Results:  Lab Results  Component Value Date   WBC 6.4 07/14/2021   HGB 10.4 (L) 07/14/2021   HCT 34.0 (L) 07/14/2021   MCV 91.6 07/14/2021   PLT 222 07/14/2021   NEUTROABS 3.3 07/14/2021    Imaging:  No results found.  Medications: I have reviewed the patient's current medications.  Assessment/Plan: Colon cancer, cecum, stage IIb (T4a N0 M0), status post a laparoscopic right colectomy 05/03/2021 0/19 lymph nodes, no perineural or lymphovascular invasion, negative resection margins, no tumor deposits, tumor extends into subserosal connective tissue with microscopic involvement of the serosa, MSS, no loss of mismatch repair protein expression CT the abdomen/pelvis 02/03/2021-no bowel obstruction, no adenopathy, probable fatty liver CT chest 02/14/2021-2 mm right middle lobe and 6 mm left upper lobe fissural nodule, nonspecific Elevated preoperative CEA, repeat CEA 06/05/2021-4.3 Guardant Reveal 06/05/2021-negative Cycle 1 adjuvant Xeloda 06/05/2021 Cycle 2 Xeloda 06/26/2021--reported he was still taking Xeloda 07/14/2021, instructed to stop further Xeloda with the plan to  resume with cycle 3 on 07/24/2021   Iron deficiency anemia secondary to #1 Diabetes Peripheral neuropathy History of prostate cancer, status post prostatectomy 08/29/2012, pT2c, N0, MX, Gleason 7  Disposition: Mr. Leather appears stable.  He has completed 2 cycles of adjuvant Xeloda.  He has not taken the 1 week break during cycle 2.  I instructed him to stop further Xeloda at this time.  He will return for repeat labs in 1 week.  If labs are adequate he will begin cycle 3 adjuvant Xeloda on 07/24/2021.  CBC from today reviewed.  Counts overall stable, hemoglobin slightly improved.  He will return for labs on 07/21/2021.  Plan to begin cycle 3 07/24/2021 if labs are adequate.  We will see him in follow-up on 08/10/2021 which should be during his 1 week break.  We are making him a medication calendar.    Ned Card ANP/GNP-BC   07/14/2021  9:04 AM

## 2021-07-17 NOTE — Telephone Encounter (Signed)
Medication on hold till 07/24/21

## 2021-07-20 ENCOUNTER — Other Ambulatory Visit (HOSPITAL_COMMUNITY): Payer: Self-pay

## 2021-07-21 ENCOUNTER — Other Ambulatory Visit: Payer: Self-pay

## 2021-07-21 ENCOUNTER — Telehealth: Payer: Self-pay | Admitting: *Deleted

## 2021-07-21 ENCOUNTER — Inpatient Hospital Stay: Payer: BC Managed Care – PPO

## 2021-07-21 DIAGNOSIS — C182 Malignant neoplasm of ascending colon: Secondary | ICD-10-CM

## 2021-07-21 DIAGNOSIS — C180 Malignant neoplasm of cecum: Secondary | ICD-10-CM | POA: Diagnosis not present

## 2021-07-21 LAB — CBC WITH DIFFERENTIAL (CANCER CENTER ONLY)
Abs Immature Granulocytes: 0.01 10*3/uL (ref 0.00–0.07)
Basophils Absolute: 0 10*3/uL (ref 0.0–0.1)
Basophils Relative: 0 %
Eosinophils Absolute: 0.1 10*3/uL (ref 0.0–0.5)
Eosinophils Relative: 1 %
HCT: 34.7 % — ABNORMAL LOW (ref 39.0–52.0)
Hemoglobin: 10.7 g/dL — ABNORMAL LOW (ref 13.0–17.0)
Immature Granulocytes: 0 %
Lymphocytes Relative: 23 %
Lymphs Abs: 1.5 10*3/uL (ref 0.7–4.0)
MCH: 29.2 pg (ref 26.0–34.0)
MCHC: 30.8 g/dL (ref 30.0–36.0)
MCV: 94.6 fL (ref 80.0–100.0)
Monocytes Absolute: 1.1 10*3/uL — ABNORMAL HIGH (ref 0.1–1.0)
Monocytes Relative: 17 %
Neutro Abs: 3.7 10*3/uL (ref 1.7–7.7)
Neutrophils Relative %: 59 %
Platelet Count: 234 10*3/uL (ref 150–400)
RBC: 3.67 MIL/uL — ABNORMAL LOW (ref 4.22–5.81)
RDW: 21.1 % — ABNORMAL HIGH (ref 11.5–15.5)
WBC Count: 6.3 10*3/uL (ref 4.0–10.5)
nRBC: 0 % (ref 0.0–0.2)

## 2021-07-21 LAB — CMP (CANCER CENTER ONLY)
ALT: 18 U/L (ref 0–44)
AST: 23 U/L (ref 15–41)
Albumin: 3.9 g/dL (ref 3.5–5.0)
Alkaline Phosphatase: 120 U/L (ref 38–126)
Anion gap: 6 (ref 5–15)
BUN: 16 mg/dL (ref 8–23)
CO2: 29 mmol/L (ref 22–32)
Calcium: 9 mg/dL (ref 8.9–10.3)
Chloride: 104 mmol/L (ref 98–111)
Creatinine: 1.38 mg/dL — ABNORMAL HIGH (ref 0.61–1.24)
GFR, Estimated: 58 mL/min — ABNORMAL LOW (ref >60)
Glucose, Bld: 186 mg/dL — ABNORMAL HIGH (ref 70–99)
Potassium: 4.3 mmol/L (ref 3.5–5.1)
Sodium: 139 mmol/L (ref 135–145)
Total Bilirubin: 0.4 mg/dL (ref 0.3–1.2)
Total Protein: 6.9 g/dL (ref 6.5–8.1)

## 2021-07-21 NOTE — Telephone Encounter (Signed)
MD reviewed labs today and agrees he can resume capecitabine on 10/31 at current dose of 2000 mg bid X 14 days on/ 7 off.  Left this information on his voice mail and MyChart and requested he do pill count and let us know how many he still has on hand so we can do refill.

## 2021-07-24 ENCOUNTER — Other Ambulatory Visit (HOSPITAL_COMMUNITY): Payer: Self-pay

## 2021-07-24 MED ORDER — CAPECITABINE 500 MG PO TABS
2000.0000 mg | ORAL_TABLET | Freq: Two times a day (BID) | ORAL | 0 refills | Status: DC
  Filled 2021-07-24: qty 112, 21d supply, fill #0

## 2021-07-24 NOTE — Telephone Encounter (Signed)
Oral Chemotherapy Pharmacist Encounter   Spoke with Mr. Munyan and he is out out Xeloda, refill prescription sent to [facility]. [facility] aware and will send out Xeloda today for delivery tomorrow 07/25/21.   Darl Pikes, PharmD, BCPS, BCOP, CPP Hematology/Oncology Clinical Pharmacist [facility] [phone]  07/24/2021 12:21 PM

## 2021-08-03 ENCOUNTER — Other Ambulatory Visit: Payer: Self-pay | Admitting: Oncology

## 2021-08-03 ENCOUNTER — Other Ambulatory Visit (HOSPITAL_COMMUNITY): Payer: Self-pay

## 2021-08-03 DIAGNOSIS — C182 Malignant neoplasm of ascending colon: Secondary | ICD-10-CM

## 2021-08-07 ENCOUNTER — Other Ambulatory Visit: Payer: Self-pay | Admitting: Oncology

## 2021-08-07 ENCOUNTER — Other Ambulatory Visit (HOSPITAL_COMMUNITY): Payer: Self-pay

## 2021-08-07 DIAGNOSIS — C182 Malignant neoplasm of ascending colon: Secondary | ICD-10-CM

## 2021-08-08 ENCOUNTER — Other Ambulatory Visit (HOSPITAL_COMMUNITY): Payer: Self-pay

## 2021-08-08 ENCOUNTER — Other Ambulatory Visit: Payer: Self-pay | Admitting: Oncology

## 2021-08-08 DIAGNOSIS — C182 Malignant neoplasm of ascending colon: Secondary | ICD-10-CM

## 2021-08-08 MED FILL — Capecitabine Tab 500 MG: ORAL | 21 days supply | Qty: 112 | Fill #0 | Status: CN

## 2021-08-09 ENCOUNTER — Other Ambulatory Visit (HOSPITAL_COMMUNITY): Payer: Self-pay

## 2021-08-10 ENCOUNTER — Other Ambulatory Visit (HOSPITAL_COMMUNITY): Payer: Self-pay

## 2021-08-10 ENCOUNTER — Inpatient Hospital Stay: Payer: BC Managed Care – PPO | Attending: Oncology | Admitting: Oncology

## 2021-08-10 ENCOUNTER — Other Ambulatory Visit: Payer: Self-pay

## 2021-08-10 ENCOUNTER — Inpatient Hospital Stay: Payer: BC Managed Care – PPO

## 2021-08-10 VITALS — BP 158/82 | HR 84 | Temp 98.1°F | Resp 18 | Ht 73.0 in | Wt 246.0 lb

## 2021-08-10 DIAGNOSIS — C180 Malignant neoplasm of cecum: Secondary | ICD-10-CM | POA: Insufficient documentation

## 2021-08-10 DIAGNOSIS — E119 Type 2 diabetes mellitus without complications: Secondary | ICD-10-CM | POA: Insufficient documentation

## 2021-08-10 DIAGNOSIS — Z8546 Personal history of malignant neoplasm of prostate: Secondary | ICD-10-CM | POA: Insufficient documentation

## 2021-08-10 DIAGNOSIS — G629 Polyneuropathy, unspecified: Secondary | ICD-10-CM | POA: Insufficient documentation

## 2021-08-10 DIAGNOSIS — C182 Malignant neoplasm of ascending colon: Secondary | ICD-10-CM

## 2021-08-10 DIAGNOSIS — D630 Anemia in neoplastic disease: Secondary | ICD-10-CM | POA: Insufficient documentation

## 2021-08-10 LAB — CBC WITH DIFFERENTIAL (CANCER CENTER ONLY)
Abs Immature Granulocytes: 0.02 10*3/uL (ref 0.00–0.07)
Basophils Absolute: 0 10*3/uL (ref 0.0–0.1)
Basophils Relative: 0 %
Eosinophils Absolute: 0.1 10*3/uL (ref 0.0–0.5)
Eosinophils Relative: 1 %
HCT: 35.8 % — ABNORMAL LOW (ref 39.0–52.0)
Hemoglobin: 11.2 g/dL — ABNORMAL LOW (ref 13.0–17.0)
Immature Granulocytes: 0 %
Lymphocytes Relative: 25 %
Lymphs Abs: 1.7 10*3/uL (ref 0.7–4.0)
MCH: 29.6 pg (ref 26.0–34.0)
MCHC: 31.3 g/dL (ref 30.0–36.0)
MCV: 94.5 fL (ref 80.0–100.0)
Monocytes Absolute: 0.7 10*3/uL (ref 0.1–1.0)
Monocytes Relative: 10 %
Neutro Abs: 4.5 10*3/uL (ref 1.7–7.7)
Neutrophils Relative %: 64 %
Platelet Count: 231 10*3/uL (ref 150–400)
RBC: 3.79 MIL/uL — ABNORMAL LOW (ref 4.22–5.81)
RDW: 21.6 % — ABNORMAL HIGH (ref 11.5–15.5)
WBC Count: 7 10*3/uL (ref 4.0–10.5)
nRBC: 0 % (ref 0.0–0.2)

## 2021-08-10 LAB — CMP (CANCER CENTER ONLY)
ALT: 23 U/L (ref 0–44)
AST: 22 U/L (ref 15–41)
Albumin: 4.2 g/dL (ref 3.5–5.0)
Alkaline Phosphatase: 136 U/L — ABNORMAL HIGH (ref 38–126)
Anion gap: 8 (ref 5–15)
BUN: 24 mg/dL — ABNORMAL HIGH (ref 8–23)
CO2: 27 mmol/L (ref 22–32)
Calcium: 9.9 mg/dL (ref 8.9–10.3)
Chloride: 105 mmol/L (ref 98–111)
Creatinine: 1.35 mg/dL — ABNORMAL HIGH (ref 0.61–1.24)
GFR, Estimated: 59 mL/min — ABNORMAL LOW (ref >60)
Glucose, Bld: 260 mg/dL — ABNORMAL HIGH (ref 70–99)
Potassium: 4.8 mmol/L (ref 3.5–5.1)
Sodium: 140 mmol/L (ref 135–145)
Total Bilirubin: 0.3 mg/dL (ref 0.3–1.2)
Total Protein: 7.9 g/dL (ref 6.5–8.1)

## 2021-08-10 NOTE — Progress Notes (Signed)
  [place] OFFICE PROGRESS NOTE   Diagnosis: Colon cancer  INTERVAL HISTORY:   Wyatt Meyer returns as scheduled.  He completed another cycle of Xeloda beginning 07/24/2021.  No mouth sores or diarrhea.  He reports nausea this morning after taking liquid iron.  He has hyperpigmentation at the hands and feet.  No pain.  Objective:  Vital signs in last 24 hours:  Blood pressure (!) 158/82, pulse 84, temperature 98.1 F (36.7 C), temperature source Oral, resp. rate 18, height _0  (1.854 m), weight 246 lb (111.6 kg), SpO2 100 %.    HEENT: No thrush or ulcers Resp: Lungs clear bilaterally Cardio: Regular rate and rhythm GI: No hepatosplenomegaly Vascular: No leg edema  Skin: Hyperpigmentation and dryness of the hands and soles, no skin breakdown  Portacath/PICC-without erythema  Lab Results:  Lab Results  Component Value Date   WBC 7.0 08/10/2021   HGB 11.2 (L) 08/10/2021   HCT 35.8 (L) 08/10/2021   MCV 94.5 08/10/2021   PLT 231 08/10/2021   NEUTROABS 4.5 08/10/2021    CMP  Lab Results  Component Value Date   NA 140 08/10/2021   K 4.8 08/10/2021   CL 105 08/10/2021   CO2 27 08/10/2021   GLUCOSE 260 (H) 08/10/2021   BUN 24 (H) 08/10/2021   CREATININE 1.35 (H) 08/10/2021   CALCIUM 9.9 08/10/2021   PROT 7.9 08/10/2021   ALBUMIN 4.2 08/10/2021   AST 22 08/10/2021   ALT 23 08/10/2021   ALKPHOS 136 (H) 08/10/2021   BILITOT 0.3 08/10/2021   GFRNONAA 59 (L) 08/10/2021   GFRAA 84 04/04/2016    Lab Results  Component Value Date   CEA 4.30 06/05/2021     Medications: I have reviewed the patient's current medications.   Assessment/Plan: Colon cancer, cecum, stage IIb (T4a N0 M0), status post a laparoscopic right colectomy 05/03/2021 0/19 lymph nodes, no perineural or lymphovascular invasion, negative resection margins, no tumor deposits, tumor extends into subserosal connective tissue with microscopic involvement of the serosa, MSS, no loss of  mismatch repair protein expression CT the abdomen/pelvis 02/03/2021-no bowel obstruction, no adenopathy, probable fatty liver CT chest 02/14/2021-2 mm right middle lobe and 6 mm left upper lobe fissural nodule, nonspecific Elevated preoperative CEA, repeat CEA 06/05/2021-4.3 Guardant Reveal 06/05/2021-negative Cycle 1 adjuvant Xeloda 06/05/2021 Cycle 2 Xeloda 06/26/2021--reported he was still taking Xeloda 07/14/2021, instructed to stop further Xeloda with the plan to resume with cycle 3 on 07/24/2021 Cycle 3 Xeloda 07/24/2021 Cycle 4 Xeloda 08/14/2021   Iron deficiency anemia secondary to #1 Diabetes Peripheral neuropathy History of prostate cancer, status post prostatectomy 08/29/2012, pT2c, N0, MX, Gleason 7    Disposition: Wyatt Mozer appears stable.  He is tolerating the Xeloda well.  He will complete cycle 4 beginning 08/14/2021.  He will return for an office and lab visit in 3 weeks.    Betsy Coder, MD  08/10/2021  9:37 AM

## 2021-08-21 ENCOUNTER — Other Ambulatory Visit (HOSPITAL_COMMUNITY): Payer: Self-pay

## 2021-08-21 MED FILL — Capecitabine Tab 500 MG: ORAL | 21 days supply | Qty: 112 | Fill #0 | Status: AC

## 2021-08-24 ENCOUNTER — Other Ambulatory Visit: Payer: Self-pay | Admitting: *Deleted

## 2021-08-24 DIAGNOSIS — C182 Malignant neoplasm of ascending colon: Secondary | ICD-10-CM

## 2021-08-24 NOTE — Progress Notes (Signed)
Patient due time point #2 collection of Guardant Reveal Testing. Order placed for draw on 08/31/21

## 2021-08-31 ENCOUNTER — Inpatient Hospital Stay: Payer: BC Managed Care – PPO

## 2021-08-31 ENCOUNTER — Encounter: Payer: Self-pay | Admitting: Nurse Practitioner

## 2021-08-31 ENCOUNTER — Inpatient Hospital Stay: Payer: BC Managed Care – PPO | Attending: Oncology | Admitting: Nurse Practitioner

## 2021-08-31 ENCOUNTER — Other Ambulatory Visit: Payer: Self-pay

## 2021-08-31 VITALS — BP 163/74 | HR 88 | Temp 98.1°F | Resp 18 | Ht 73.0 in | Wt 244.2 lb

## 2021-08-31 DIAGNOSIS — D630 Anemia in neoplastic disease: Secondary | ICD-10-CM | POA: Diagnosis not present

## 2021-08-31 DIAGNOSIS — C180 Malignant neoplasm of cecum: Secondary | ICD-10-CM | POA: Diagnosis not present

## 2021-08-31 DIAGNOSIS — C182 Malignant neoplasm of ascending colon: Secondary | ICD-10-CM

## 2021-08-31 DIAGNOSIS — E1142 Type 2 diabetes mellitus with diabetic polyneuropathy: Secondary | ICD-10-CM | POA: Diagnosis not present

## 2021-08-31 DIAGNOSIS — Z8546 Personal history of malignant neoplasm of prostate: Secondary | ICD-10-CM | POA: Insufficient documentation

## 2021-08-31 LAB — CBC WITH DIFFERENTIAL (CANCER CENTER ONLY)
Abs Immature Granulocytes: 0.01 10*3/uL (ref 0.00–0.07)
Basophils Absolute: 0 10*3/uL (ref 0.0–0.1)
Basophils Relative: 1 %
Eosinophils Absolute: 0.1 10*3/uL (ref 0.0–0.5)
Eosinophils Relative: 1 %
HCT: 34.8 % — ABNORMAL LOW (ref 39.0–52.0)
Hemoglobin: 10.7 g/dL — ABNORMAL LOW (ref 13.0–17.0)
Immature Granulocytes: 0 %
Lymphocytes Relative: 23 %
Lymphs Abs: 1.3 10*3/uL (ref 0.7–4.0)
MCH: 30.2 pg (ref 26.0–34.0)
MCHC: 30.7 g/dL (ref 30.0–36.0)
MCV: 98.3 fL (ref 80.0–100.0)
Monocytes Absolute: 0.7 10*3/uL (ref 0.1–1.0)
Monocytes Relative: 12 %
Neutro Abs: 3.7 10*3/uL (ref 1.7–7.7)
Neutrophils Relative %: 63 %
Platelet Count: 202 10*3/uL (ref 150–400)
RBC: 3.54 MIL/uL — ABNORMAL LOW (ref 4.22–5.81)
RDW: 21.9 % — ABNORMAL HIGH (ref 11.5–15.5)
WBC Count: 5.8 10*3/uL (ref 4.0–10.5)
nRBC: 0 % (ref 0.0–0.2)

## 2021-08-31 LAB — CMP (CANCER CENTER ONLY)
ALT: 22 U/L (ref 0–44)
AST: 21 U/L (ref 15–41)
Albumin: 3.9 g/dL (ref 3.5–5.0)
Alkaline Phosphatase: 141 U/L — ABNORMAL HIGH (ref 38–126)
Anion gap: 8 (ref 5–15)
BUN: 19 mg/dL (ref 8–23)
CO2: 25 mmol/L (ref 22–32)
Calcium: 9.2 mg/dL (ref 8.9–10.3)
Chloride: 103 mmol/L (ref 98–111)
Creatinine: 1.33 mg/dL — ABNORMAL HIGH (ref 0.61–1.24)
GFR, Estimated: >60 mL/min (ref >60)
Glucose, Bld: 319 mg/dL — ABNORMAL HIGH (ref 70–99)
Potassium: 4 mmol/L (ref 3.5–5.1)
Sodium: 136 mmol/L (ref 135–145)
Total Bilirubin: 0.5 mg/dL (ref 0.3–1.2)
Total Protein: 7.3 g/dL (ref 6.5–8.1)

## 2021-08-31 NOTE — Progress Notes (Signed)
  [place] OFFICE PROGRESS NOTE   Diagnosis: Colon cancer  INTERVAL HISTORY:   Mr. Diveley returns as scheduled.  He began cycle 4 Xeloda beginning 08/14/2021.  He has not started the 7-day break, reporting he had extra pills.  He denies nausea/vomiting.  No mouth sores.  No diarrhea.  He does note frequent bowel movements averaging 2 to 4/day.  He notes hands and feet are dark and dry.  Objective:  Vital signs in last 24 hours:  Blood pressure (!) 163/74, pulse 88, temperature 98.1 F (36.7 C), temperature source Oral, resp. rate 18, height $RemoveBe'6\' 1"'jxAALhLlk$  (1.854 m), weight 244 lb 3.2 oz (110.8 kg), SpO2 99 %.    HEENT: Tongue with scattered areas of hyperpigmentation.  No thrush or ulcers. Resp: Lungs clear bilaterally. Cardio: Regular rate and rhythm. GI: Abdomen soft and nontender.  No hepatosplenomegaly. Vascular: No significant leg edema. Skin: Palms are dry appearing, hyperpigmented.  Soles with hyperpigmentation, mild dryness.  No skin breakdown.   Lab Results:  Lab Results  Component Value Date   WBC 5.8 08/31/2021   HGB 10.7 (L) 08/31/2021   HCT 34.8 (L) 08/31/2021   MCV 98.3 08/31/2021   PLT 202 08/31/2021   NEUTROABS 3.7 08/31/2021    Imaging:  No results found.  Medications: I have reviewed the patient's current medications.  Assessment/Plan: Colon cancer, cecum, stage IIb (T4a N0 M0), status post a laparoscopic right colectomy 05/03/2021 0/19 lymph nodes, no perineural or lymphovascular invasion, negative resection margins, no tumor deposits, tumor extends into subserosal connective tissue with microscopic involvement of the serosa, MSS, no loss of mismatch repair protein expression CT the abdomen/pelvis 02/03/2021-no bowel obstruction, no adenopathy, probable fatty liver CT chest 02/14/2021-2 mm right middle lobe and 6 mm left upper lobe fissural nodule, nonspecific Elevated preoperative CEA, repeat CEA 06/05/2021-4.3 Guardant Reveal  06/05/2021-negative Cycle 1 adjuvant Xeloda 06/05/2021 Cycle 2 Xeloda 06/26/2021--reported he was still taking Xeloda 07/14/2021, instructed to stop further Xeloda with the plan to resume with cycle 3 on 07/24/2021 Cycle 3 Xeloda 07/24/2021 Cycle 4 Xeloda 08/14/2021-reported he was still taking Xeloda 08/31/2021, instructed to stop further Xeloda with the plan to resume with cycle 5 on 09/08/2021 Guardant reveal pending 08/31/2021   Iron deficiency anemia secondary to #1 Diabetes Peripheral neuropathy History of prostate cancer, status post prostatectomy 08/29/2012, pT2c, N0, MX, Gleason 7  Disposition: Mr. Carruthers appears stable.  He has completed 4 cycles of Xeloda.  He misunderstood the instructions and has continued Xeloda beyond 14 days.  He was instructed to stop Xeloda today, begin a 7-day break, with the plan to resume with cycle 5 Xeloda on 09/08/2021.  We are providing him with a medication calendar today.  CBC and chemistry panel reviewed.  Labs adequate to proceed as above.  He will return for follow-up in approximately 3 weeks.  We are available to see him sooner if needed.    Ned Card ANP/GNP-BC   08/31/2021  8:16 AM

## 2021-09-04 ENCOUNTER — Telehealth: Payer: Self-pay | Admitting: Oncology

## 2021-09-04 NOTE — Telephone Encounter (Signed)
Attempted to contact to inform of appointment on 12/28, per LOS 12/8. Patient was left a voicemail to either call back or refer to [place].

## 2021-09-05 ENCOUNTER — Other Ambulatory Visit (HOSPITAL_COMMUNITY): Payer: Self-pay

## 2021-09-05 ENCOUNTER — Telehealth: Payer: Self-pay | Admitting: Oncology

## 2021-09-05 NOTE — Telephone Encounter (Signed)
Contacted patient to advise of add on lab appointment, per sch message 12/13. He is aware lab on 12/28.

## 2021-09-08 ENCOUNTER — Other Ambulatory Visit (HOSPITAL_COMMUNITY): Payer: Self-pay

## 2021-09-08 ENCOUNTER — Other Ambulatory Visit: Payer: Self-pay | Admitting: Oncology

## 2021-09-08 DIAGNOSIS — C182 Malignant neoplasm of ascending colon: Secondary | ICD-10-CM

## 2021-09-08 MED ORDER — CAPECITABINE 500 MG PO TABS
2000.0000 mg | ORAL_TABLET | Freq: Two times a day (BID) | ORAL | 0 refills | Status: DC
  Filled 2021-09-08: qty 112, 21d supply, fill #0

## 2021-09-12 ENCOUNTER — Other Ambulatory Visit (HOSPITAL_COMMUNITY): Payer: Self-pay

## 2021-09-13 ENCOUNTER — Telehealth: Payer: Self-pay

## 2021-09-13 LAB — GUARDANT 360

## 2021-09-13 NOTE — Telephone Encounter (Signed)
Patient left a voice message Called and spoke with the patient. Patient tested positive of Covid and he wanted to know how to proceed with Xeloda and his new prescription of Paxlovid. Advice the patient to wait on starting on Paxlovid, until I speak to specialty pharmacy and Dr Benay Spice.  Pt voiced understanding

## 2021-09-15 ENCOUNTER — Telehealth: Payer: Self-pay

## 2021-09-15 NOTE — Telephone Encounter (Signed)
Called the patient no answer. I was calling to check to see if he started Paxlovid

## 2021-09-20 ENCOUNTER — Inpatient Hospital Stay: Payer: BC Managed Care – PPO | Admitting: Oncology

## 2021-09-20 ENCOUNTER — Inpatient Hospital Stay: Payer: BC Managed Care – PPO

## 2021-09-28 ENCOUNTER — Other Ambulatory Visit (HOSPITAL_COMMUNITY): Payer: Self-pay

## 2021-09-28 ENCOUNTER — Inpatient Hospital Stay: Payer: BC Managed Care – PPO | Admitting: Oncology

## 2021-09-28 ENCOUNTER — Other Ambulatory Visit: Payer: Self-pay | Admitting: Oncology

## 2021-09-28 ENCOUNTER — Other Ambulatory Visit: Payer: Self-pay

## 2021-09-28 ENCOUNTER — Inpatient Hospital Stay: Payer: BC Managed Care – PPO | Attending: Oncology

## 2021-09-28 VITALS — BP 154/89 | HR 92 | Temp 97.8°F | Resp 18 | Ht 73.0 in | Wt 247.2 lb

## 2021-09-28 DIAGNOSIS — G629 Polyneuropathy, unspecified: Secondary | ICD-10-CM | POA: Insufficient documentation

## 2021-09-28 DIAGNOSIS — E119 Type 2 diabetes mellitus without complications: Secondary | ICD-10-CM | POA: Insufficient documentation

## 2021-09-28 DIAGNOSIS — C180 Malignant neoplasm of cecum: Secondary | ICD-10-CM | POA: Diagnosis present

## 2021-09-28 DIAGNOSIS — D630 Anemia in neoplastic disease: Secondary | ICD-10-CM | POA: Insufficient documentation

## 2021-09-28 DIAGNOSIS — Z8546 Personal history of malignant neoplasm of prostate: Secondary | ICD-10-CM | POA: Diagnosis not present

## 2021-09-28 DIAGNOSIS — C182 Malignant neoplasm of ascending colon: Secondary | ICD-10-CM | POA: Diagnosis not present

## 2021-09-28 DIAGNOSIS — L271 Localized skin eruption due to drugs and medicaments taken internally: Secondary | ICD-10-CM | POA: Diagnosis not present

## 2021-09-28 LAB — CMP (CANCER CENTER ONLY)
ALT: 19 U/L (ref 0–44)
AST: 20 U/L (ref 15–41)
Albumin: 3.9 g/dL (ref 3.5–5.0)
Alkaline Phosphatase: 149 U/L — ABNORMAL HIGH (ref 38–126)
Anion gap: 7 (ref 5–15)
BUN: 19 mg/dL (ref 8–23)
CO2: 28 mmol/L (ref 22–32)
Calcium: 9 mg/dL (ref 8.9–10.3)
Chloride: 106 mmol/L (ref 98–111)
Creatinine: 1.3 mg/dL — ABNORMAL HIGH (ref 0.61–1.24)
GFR, Estimated: >60 mL/min (ref >60)
Glucose, Bld: 130 mg/dL — ABNORMAL HIGH (ref 70–99)
Potassium: 4.4 mmol/L (ref 3.5–5.1)
Sodium: 141 mmol/L (ref 135–145)
Total Bilirubin: 0.3 mg/dL (ref 0.3–1.2)
Total Protein: 7.7 g/dL (ref 6.5–8.1)

## 2021-09-28 LAB — CBC WITH DIFFERENTIAL (CANCER CENTER ONLY)
Abs Immature Granulocytes: 0.03 10*3/uL (ref 0.00–0.07)
Basophils Absolute: 0 10*3/uL (ref 0.0–0.1)
Basophils Relative: 0 %
Eosinophils Absolute: 0.1 10*3/uL (ref 0.0–0.5)
Eosinophils Relative: 1 %
HCT: 33.6 % — ABNORMAL LOW (ref 39.0–52.0)
Hemoglobin: 10.5 g/dL — ABNORMAL LOW (ref 13.0–17.0)
Immature Granulocytes: 0 %
Lymphocytes Relative: 22 %
Lymphs Abs: 2.1 10*3/uL (ref 0.7–4.0)
MCH: 31.8 pg (ref 26.0–34.0)
MCHC: 31.3 g/dL (ref 30.0–36.0)
MCV: 101.8 fL — ABNORMAL HIGH (ref 80.0–100.0)
Monocytes Absolute: 1.3 10*3/uL — ABNORMAL HIGH (ref 0.1–1.0)
Monocytes Relative: 14 %
Neutro Abs: 5.9 10*3/uL (ref 1.7–7.7)
Neutrophils Relative %: 63 %
Platelet Count: 203 10*3/uL (ref 150–400)
RBC: 3.3 MIL/uL — ABNORMAL LOW (ref 4.22–5.81)
RDW: 18 % — ABNORMAL HIGH (ref 11.5–15.5)
WBC Count: 9.4 10*3/uL (ref 4.0–10.5)
nRBC: 0 % (ref 0.0–0.2)

## 2021-09-28 NOTE — Progress Notes (Signed)
[place] OFFICE PROGRESS NOTE   Diagnosis: Colon cancer  INTERVAL HISTORY:    Wyatt Meyer returns as scheduled.  He completed another cycle of Xeloda beginning 09/08/2021.  No mouth sores or diarrhea.  He has developed skin breakdown at the feet.  He started another cycle of Xeloda yesterday. He was diagnosed with COVID-19 infection during the week of 09/11/2021. Objective:  Vital signs in last 24 hours:  Blood pressure (!) 154/89, pulse 92, temperature 97.8 F (36.6 C), temperature source Oral, resp. rate 18, height _0  (1.854 m), weight 247 lb 3.2 oz (112.1 kg), SpO2 100 %.    HEENT: No thrush or ulcers, hyperpigmentation of the tongue Resp: Lungs clear bilaterally Cardio: Regular rate and rhythm GI: No hepatosplenomegaly, nontender Vascular: Trace lower leg edema bilaterally  Skin: Hyperpigmentation and dryness of the hands, dryness, hyperpigmentation, and areas of skin breakdown at the soles.  There are 2 areas of linear ulceration and a wider area of ulceration at the base of a toe.  Portacath/PICC-without erythema  Lab Results:  Lab Results  Component Value Date   WBC 9.4 09/28/2021   HGB 10.5 (L) 09/28/2021   HCT 33.6 (L) 09/28/2021   MCV 101.8 (H) 09/28/2021   PLT 203 09/28/2021   NEUTROABS 5.9 09/28/2021    CMP  Lab Results  Component Value Date   NA 141 09/28/2021   K 4.4 09/28/2021   CL 106 09/28/2021   CO2 28 09/28/2021   GLUCOSE 130 (H) 09/28/2021   BUN 19 09/28/2021   CREATININE 1.30 (H) 09/28/2021   CALCIUM 9.0 09/28/2021   PROT 7.7 09/28/2021   ALBUMIN 3.9 09/28/2021   AST 20 09/28/2021   ALT 19 09/28/2021   ALKPHOS 149 (H) 09/28/2021   BILITOT 0.3 09/28/2021   GFRNONAA >60 09/28/2021   GFRAA 84 04/04/2016    Lab Results  Component Value Date   CEA 4.30 06/05/2021    Lab Results  Component Value Date   INR 1.1 04/18/2021   LABPROT 13.7 04/18/2021    Imaging:  No results found.  Medications: I have reviewed  the patient's current medications.   Assessment/Plan: Colon cancer, cecum, stage IIb (T4a N0 M0), status post a laparoscopic right colectomy 05/03/2021 0/19 lymph nodes, no perineural or lymphovascular invasion, negative resection margins, no tumor deposits, tumor extends into subserosal connective tissue with microscopic involvement of the serosa, MSS, no loss of mismatch repair protein expression CT the abdomen/pelvis 02/03/2021-no bowel obstruction, no adenopathy, probable fatty liver CT chest 02/14/2021-2 mm right middle lobe and 6 mm left upper lobe fissural nodule, nonspecific Elevated preoperative CEA, repeat CEA 06/05/2021-4.3 Guardant Reveal 06/05/2021-negative Cycle 1 adjuvant Xeloda 06/05/2021 Cycle 2 Xeloda 06/26/2021--reported he was still taking Xeloda 07/14/2021, instructed to stop further Xeloda with the plan to resume with cycle 3 on 07/24/2021 Cycle 3 Xeloda 07/24/2021 Cycle 4 Xeloda 08/14/2021-reported he was still taking Xeloda 08/31/2021, instructed to stop further Xeloda with the plan to resume with cycle 5 on 09/08/2021 Guardant reveal pending 08/31/2021   Iron deficiency anemia secondary to #1 Diabetes Peripheral neuropathy History of prostate cancer, status post prostatectomy 08/29/2012, pT2c, N0, MX, Gleason 7 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  He began cycle 6 yesterday.  He has developed several open ulcers at the soles.  Xeloda will be placed on hold.  He will continue using a moisturizer.  He will use Neosporin on the ulcerated areas.  He will return for an office visit next week.  Betsy Coder, MD  09/28/2021  11:51 AM

## 2021-10-04 ENCOUNTER — Encounter (HOSPITAL_BASED_OUTPATIENT_CLINIC_OR_DEPARTMENT_OTHER): Payer: BC Managed Care – PPO | Attending: Physician Assistant | Admitting: Physician Assistant

## 2021-10-04 ENCOUNTER — Inpatient Hospital Stay: Payer: BC Managed Care – PPO | Admitting: Nurse Practitioner

## 2021-10-04 ENCOUNTER — Other Ambulatory Visit: Payer: Self-pay

## 2021-10-04 ENCOUNTER — Encounter: Payer: Self-pay | Admitting: Nurse Practitioner

## 2021-10-04 VITALS — BP 150/84 | HR 84 | Temp 98.1°F | Resp 18 | Ht 73.0 in | Wt 244.0 lb

## 2021-10-04 DIAGNOSIS — L84 Corns and callosities: Secondary | ICD-10-CM | POA: Diagnosis not present

## 2021-10-04 DIAGNOSIS — D630 Anemia in neoplastic disease: Secondary | ICD-10-CM | POA: Diagnosis not present

## 2021-10-04 DIAGNOSIS — E1142 Type 2 diabetes mellitus with diabetic polyneuropathy: Secondary | ICD-10-CM | POA: Insufficient documentation

## 2021-10-04 DIAGNOSIS — Z9079 Acquired absence of other genital organ(s): Secondary | ICD-10-CM | POA: Diagnosis not present

## 2021-10-04 DIAGNOSIS — L97512 Non-pressure chronic ulcer of other part of right foot with fat layer exposed: Secondary | ICD-10-CM | POA: Diagnosis not present

## 2021-10-04 DIAGNOSIS — E11621 Type 2 diabetes mellitus with foot ulcer: Secondary | ICD-10-CM | POA: Diagnosis present

## 2021-10-04 DIAGNOSIS — C188 Malignant neoplasm of overlapping sites of colon: Secondary | ICD-10-CM | POA: Insufficient documentation

## 2021-10-04 DIAGNOSIS — Z9049 Acquired absence of other specified parts of digestive tract: Secondary | ICD-10-CM | POA: Insufficient documentation

## 2021-10-04 DIAGNOSIS — C182 Malignant neoplasm of ascending colon: Secondary | ICD-10-CM

## 2021-10-04 DIAGNOSIS — C180 Malignant neoplasm of cecum: Secondary | ICD-10-CM | POA: Diagnosis not present

## 2021-10-04 DIAGNOSIS — L97522 Non-pressure chronic ulcer of other part of left foot with fat layer exposed: Secondary | ICD-10-CM | POA: Insufficient documentation

## 2021-10-04 DIAGNOSIS — Z8546 Personal history of malignant neoplasm of prostate: Secondary | ICD-10-CM | POA: Diagnosis not present

## 2021-10-04 NOTE — Progress Notes (Addendum)
[place] OFFICE PROGRESS NOTE   Diagnosis:  Colon cancer  INTERVAL HISTORY:   Wyatt Meyer returns as scheduled.  Feet are feeling better.  No issues with the hands.  He scheduled an appointment at the wound clinic.  No nausea or vomiting.  No mouth sores.  No diarrhea.  He estimates 2 soft bowel movements a day.  Objective:  Vital signs in last 24 hours:  Blood pressure (!) 150/84, pulse 84, temperature 98.1 F (36.7 C), temperature source Oral, resp. rate 18, height _0  (1.854 m), weight 244 lb (110.7 kg), SpO2 100 %.    HEENT: No thrush or ulcers. Resp: Lungs clear bilaterally. Cardio: Regular rate and rhythm. GI: No hepatomegaly. Vascular: No leg edema. Skin: Palms dry appearing with hyperpigmentation.  Soles with 3 areas of skin breakdown, 2 areas of linear ulceration (1 on each sole) and linear ulceration at the base of a toe.  Areas appear to be healing, no evidence of infection.   Lab Results:  Lab Results  Component Value Date   WBC 9.4 09/28/2021   HGB 10.5 (L) 09/28/2021   HCT 33.6 (L) 09/28/2021   MCV 101.8 (H) 09/28/2021   PLT 203 09/28/2021   NEUTROABS 5.9 09/28/2021    Imaging:  No results found.  Medications: I have reviewed the patient's current medications.  Assessment/Plan: Colon cancer, cecum, stage IIb (T4a N0 M0), status post a laparoscopic right colectomy 05/03/2021 0/19 lymph nodes, no perineural or lymphovascular invasion, negative resection margins, no tumor deposits, tumor extends into subserosal connective tissue with microscopic involvement of the serosa, MSS, no loss of mismatch repair protein expression CT the abdomen/pelvis 02/03/2021-no bowel obstruction, no adenopathy, probable fatty liver CT chest 02/14/2021-2 mm right middle lobe and 6 mm left upper lobe fissural nodule, nonspecific Elevated preoperative CEA, repeat CEA 06/05/2021-4.3 Guardant Reveal 06/05/2021-negative Cycle 1 adjuvant Xeloda 06/05/2021 Cycle 2  Xeloda 06/26/2021--reported he was still taking Xeloda 07/14/2021, instructed to stop further Xeloda with the plan to resume with cycle 3 on 07/24/2021 Cycle 3 Xeloda 07/24/2021 Cycle 4 Xeloda 08/14/2021-reported he was still taking Xeloda 08/31/2021, instructed to stop further Xeloda with the plan to resume with cycle 5 on 09/08/2021 Guardant reveal 08/31/2021-negative Cycle 5 Xeloda 09/08/2021 Xeloda placed on hold due to hand-foot syndrome 09/28/2021   Iron deficiency anemia secondary to #1 Diabetes Peripheral neuropathy History of prostate cancer, status post prostatectomy 08/29/2012, pT2c, N0, MX, Gleason 7 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  He was noted to have progressive hand-foot syndrome with several open ulcers at the soles 09/28/2021.  Xeloda placed on hold.  He has persistent areas of ulceration on the soles, appear to be healing.  Plan to continue to hold Xeloda.  He will return for an office visit next week.    Ned Card ANP/GNP-BC   10/04/2021  8:09 AM

## 2021-10-04 NOTE — Progress Notes (Signed)
WILMOT, QUEVEDO ([id number]) Visit Report for 10/04/2021 Abuse/Suicide Risk Screen Details Patient Name: Date of Service: Wyatt Meyer, Wyatt D. 10/04/2021 2:45 PM Medical Record Number: [id number] Patient Account Number: [id number] Date of Birth/Sex: Treating RN: [birth year] (61 y.o. [name] Primary Care Jahleel Stroschein: Dimas Chyle Other Clinician: Referring Yared Barefoot: Treating Felcia Huebert/Extender: Welford Roche Weeks in Treatment: 0 Abuse/Suicide Risk Screen Items Answer ABUSE RISK SCREEN: Has anyone close to you tried to hurt or harm you recentlyo No Do you feel uncomfortable with anyone in your familyo No Has anyone forced you do things that you didnt want to doo No Electronic Signature(s) Signed: 10/04/2021 5:54:22 PM By: Baruch Gouty RN, BSN Entered By: Baruch Gouty on 10/04/2021 15:22:09 -------------------------------------------------------------------------------- Activities of Daily Living Details Patient Name: Date of Service: Wyatt Meyer, Wyatt D. 10/04/2021 2:45 PM Medical Record Number: [id number] Patient Account Number: [id number] Date of Birth/Sex: Treating RN: 1961/08/23 (61 y.o. [name] Primary Care Rayel Santizo: Dimas Chyle Other Clinician: Referring Jeannene Tschetter: Treating Kathline Banbury/Extender: Welford Roche Weeks in Treatment: 0 Activities of Daily Living Items Answer Activities of Daily Living (Please select one for each item) Drive Automobile Completely Able T Medications ake Completely Able Use T elephone Completely Able Care for Appearance Completely Able Use T oilet Completely Able Bath / Shower Completely Able Dress Self Completely Able Feed Self Completely Able Walk Completely Able Get In / Out Bed Completely Able Housework Completely Able Prepare Meals Completely Able Handle Money Completely Able Shop for Self Completely Able Electronic Signature(s) Signed: 10/04/2021 5:54:22 PM By: Baruch Gouty RN,  BSN Entered By: Baruch Gouty on 10/04/2021 15:22:29 -------------------------------------------------------------------------------- Education Screening Details Patient Name: Date of Service: Wyatt Son D. 10/04/2021 2:45 PM Medical Record Number: [id number] Patient Account Number: [id number] Date of Birth/Sex: Treating RN: [birth year] (61 y.o. [name] Primary Care Waylen Depaolo: Dimas Chyle Other Clinician: Referring Karmella Bouvier: Treating Chibueze Beasley/Extender: Lebron Quam in Treatment: 0 Primary Learner Assessed: Patient Learning Preferences/Education Level/Primary Language Learning Preference: Explanation, Demonstration, Printed Material Highest Education Level: College or Above Preferred Language: English Cognitive Barrier Language Barrier: No Translator Needed: No Memory Deficit: No Emotional Barrier: No Cultural/Religious Beliefs Affecting Medical Care: No Physical Barrier Impaired Vision: Yes Glasses Impaired Hearing: No Decreased Hand dexterity: No Knowledge/Comprehension Knowledge Level: High Comprehension Level: High Ability to understand written instructions: High Ability to understand verbal instructions: High Motivation Anxiety Level: Calm Cooperation: Cooperative Education Importance: Acknowledges Need Interest in Health Problems: Asks Questions Perception: Coherent Willingness to Engage in Self-Management High Activities: Readiness to Engage in Self-Management High Activities: Electronic Signature(s) Signed: 10/04/2021 5:54:22 PM By: Baruch Gouty RN, BSN Entered By: Baruch Gouty on 10/04/2021 15:22:56 -------------------------------------------------------------------------------- Fall Risk Assessment Details Patient Name: Date of Service: Wyatt Son D. 10/04/2021 2:45 PM Medical Record Number: [id number] Patient Account Number: [id number] Date of Birth/Sex: Treating RN: November 16, 1960 (61 y.o. [name] Primary Care Cyndy Braver: Dimas Chyle Other Clinician: Referring Dravin Lance: Treating Verbie Babic/Extender: Lebron Quam in Treatment: 0 Fall Risk Assessment Items Have you had 2 or more falls in the last 12 monthso 0 Yes Have you had any fall that resulted in injury in the last 12 monthso 0 No FALLS RISK SCREEN History of falling - immediate or within 3 months 25 Yes Secondary diagnosis (Do you have 2 or more medical diagnoseso) 0 No Ambulatory aid None/bed rest/wheelchair/nurse 0 Yes Crutches/cane/walker 0 No Furniture 0 No Intravenous therapy Access/Saline/Heparin Lock 0 No Gait/Transferring  Normal/ bed rest/ wheelchair 0 Yes Weak (short steps with or without shuffle, stooped but able to lift head while walking, may seek 0 No support from furniture) Impaired (short steps with shuffle, may have difficulty arising from chair, head down, impaired 0 No balance) Mental Status Oriented to own ability 0 Yes Electronic Signature(s) Signed: 10/04/2021 5:54:22 PM By: Baruch Gouty RN, BSN Entered By: Baruch Gouty on 10/04/2021 15:23:27 -------------------------------------------------------------------------------- Foot Assessment Details Patient Name: Date of Service: Wyatt Son D. 10/04/2021 2:45 PM Medical Record Number: [id number] Patient Account Number: [id number] Date of Birth/Sex: Treating RN: Apr 29, 1961 (61 y.o. [name] Primary Care Suresh Audi: Dimas Chyle Other Clinician: Referring Charlissa Petros: Treating Dusty Raczkowski/Extender: Welford Roche Weeks in Treatment: 0 Foot Assessment Items Site Locations + = Sensation present, - = Sensation absent, C = Callus, U = Ulcer R = Redness, W = Warmth, M = Maceration, PU = Pre-ulcerative lesion F = Fissure, S = Swelling, D = Dryness Assessment Right: Left: Other Deformity: No No Prior Foot Ulcer: No No Prior Amputation: No No Charcot Joint: No No Ambulatory Status:  Ambulatory Without Help Gait: Steady Electronic Signature(s) Signed: 10/04/2021 5:54:22 PM By: Baruch Gouty RN, BSN Entered By: Baruch Gouty on 10/04/2021 15:26:53 -------------------------------------------------------------------------------- Nutrition Risk Screening Details Patient Name: Date of Service: Wyatt Son D. 10/04/2021 2:45 PM Medical Record Number: [id number] Patient Account Number: [id number] Date of Birth/Sex: Treating RN: 1961/02/16 (61 y.o. [name] Primary Care Jyles Sontag: Dimas Chyle Other Clinician: Referring Sun Kihn: Treating Tata Timmins/Extender: Welford Roche Weeks in Treatment: 0 Height (in): Weight (lbs): Body Mass Index (BMI): Nutrition Risk Screening Items Score Screening NUTRITION RISK SCREEN: I have an illness or condition that made me change the kind and/or amount of food I eat 0 No I eat fewer than two meals per day 0 No I eat few fruits and vegetables, or milk products 0 No I have three or more drinks of beer, liquor or wine almost every day 0 No I have tooth or mouth problems that make it hard for me to eat 0 No I don't always have enough money to buy the food I need 0 No I eat alone most of the time 0 No I take three or more different prescribed or over-the-counter drugs a day 1 Yes Without wanting to, I have lost or gained 10 pounds in the last six months 0 No I am not always physically able to shop, cook and/or feed myself 0 No Nutrition Protocols Good Risk Protocol 0 No interventions needed Moderate Risk Protocol High Risk Proctocol Risk Level: Good Risk Score: 1 Electronic Signature(s) Signed: 10/04/2021 5:54:22 PM By: Baruch Gouty RN, BSN Entered By: Baruch Gouty on 10/04/2021 15:24:12

## 2021-10-04 NOTE — Progress Notes (Signed)
CORTNEY, MCKINNEY ([id number]) Visit Report for 10/04/2021 Chief Complaint Document Details Patient Name: Date of Service: Wyatt Meyer, Wyatt Meyer. 10/04/2021 2:45 PM Medical Record Number: [id number] Patient Account Number: [id number] Date of Birth/Sex: Treating RN: 1961/03/12 (61 y.o. [name] Primary Care Provider: Dimas Chyle Other Clinician: Referring Provider: Treating Provider/Extender: Lebron Quam in Treatment: 0 Information Obtained from: Patient Chief Complaint Left foot and right 1st toe ulcers Electronic Signature(s) Signed: 10/04/2021 4:06:32 PM By: Worthy Keeler PA-C Entered By: Worthy Keeler on 10/04/2021 16:06:32 -------------------------------------------------------------------------------- Debridement Details Patient Name: Date of Service: Wyatt Son Meyer. 10/04/2021 2:45 PM Medical Record Number: [id number] Patient Account Number: [id number] Date of Birth/Sex: Treating RN: Jul 11, 1961 (61 y.o. [name] Primary Care Provider: Dimas Chyle Other Clinician: Referring Provider: Treating Provider/Extender: Welford Roche Weeks in Treatment: 0 Debridement Performed for Assessment: Wound #2 Right,Plantar T Great oe Performed By: Physician Worthy Keeler, PA Debridement Type: Debridement Severity of Tissue Pre Debridement: Fat layer exposed Level of Consciousness (Pre-procedure): Awake and Alert Pre-procedure Verification/Time Out Yes - 16:15 Taken: Start Time: 16:16 T Area Debrided (L x W): otal 1 (cm) x 1 (cm) = 1 (cm) Tissue and other material debrided: Viable, Non-Viable, Callus, Subcutaneous, Skin: Epidermis Level: Skin/Subcutaneous Tissue Debridement Description: Excisional Instrument: Curette Bleeding: Minimum Hemostasis Achieved: Pressure Procedural Pain: 0 Post Procedural Pain: 0 Response to Treatment: Procedure was tolerated well Level of Consciousness (Post- Awake and  Alert procedure): Post Debridement Measurements of Total Wound Length: (cm) 0.4 Width: (cm) 0.9 Depth: (cm) 0.1 Volume: (cm) 0.028 Character of Wound/Ulcer Post Debridement: Improved Severity of Tissue Post Debridement: Fat layer exposed Post Procedure Diagnosis Same as Pre-procedure Electronic Signature(s) Signed: 10/04/2021 5:05:22 PM By: Worthy Keeler PA-C Signed: 10/04/2021 5:54:22 PM By: Baruch Gouty RN, BSN Entered By: Baruch Gouty on 10/04/2021 16:23:06 -------------------------------------------------------------------------------- Debridement Details Patient Name: Date of Service: Wyatt Son Meyer. 10/04/2021 2:45 PM Medical Record Number: [id number] Patient Account Number: [id number] Date of Birth/Sex: Treating RN: [birth year] (61 y.o. [name] Primary Care Provider: Dimas Chyle Other Clinician: Referring Provider: Treating Provider/Extender: Welford Roche Weeks in Treatment: 0 Debridement Performed for Assessment: Wound #1 Left,Plantar Foot Performed By: Physician Worthy Keeler, PA Debridement Type: Debridement Severity of Tissue Pre Debridement: Fat layer exposed Level of Consciousness (Pre-procedure): Awake and Alert Pre-procedure Verification/Time Out Yes - 16:15 Taken: Start Time: 16:16 T Area Debrided (L x W): otal 1 (cm) x 2.5 (cm) = 2.5 (cm) Tissue and other material debrided: Viable, Non-Viable, Callus, Subcutaneous, Skin: Epidermis Level: Skin/Subcutaneous Tissue Debridement Description: Excisional Instrument: Curette Bleeding: Minimum Hemostasis Achieved: Pressure Procedural Pain: 0 Post Procedural Pain: 0 Response to Treatment: Procedure was tolerated well Level of Consciousness (Post- Awake and Alert procedure): Post Debridement Measurements of Total Wound Length: (cm) 0.4 Width: (cm) 1.8 Depth: (cm) 0.1 Volume: (cm) 0.057 Character of Wound/Ulcer Post Debridement: Improved Severity of Tissue Post  Debridement: Fat layer exposed Post Procedure Diagnosis Same as Pre-procedure Electronic Signature(s) Signed: 10/04/2021 5:05:22 PM By: Worthy Keeler PA-C Signed: 10/04/2021 5:54:22 PM By: Baruch Gouty RN, BSN Entered By: Baruch Gouty on 10/04/2021 16:27:12 -------------------------------------------------------------------------------- HPI Details Patient Name: Date of Service: Wyatt Son Meyer. 10/04/2021 2:45 PM Medical Record Number: [id number] Patient Account Number: [id number] Date of Birth/Sex: Treating RN: 01-11-1961 (61 y.o. [name] Primary Care Provider: Dimas Chyle Other Clinician: Referring Provider: Treating Provider/Extender: Earline [place], Andreas Ohm  in Treatment: 0 History of Present Illness HPI Description: 10/04/2021 on evaluation today patient presents for initial inspection here in the clinic concerning issues that he has been having with the plantar aspect of his feet bilaterally. Subsequently this seems to be in relation to the chemotherapy that he has been undergoing, Xeloda. Subsequently the patient had what appears to be as best I can tell 5 cycles I believe and then was placed on hold on 09/28/2021 due to the issues with the skin on the bottom of his feet. He has a history of prostate cancer status post prostatectomy. He also has peripheral neuropathy, diabetes mellitus type 2, iron deficiency anemia secondary to the colon cancer and bleeding that was associated. 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 Upon further review it actually appears he began cycle 6 of the Xeloda on 09/27/2021. And therefore was placed on hold. He has been using Neosporin to the ulcerated areas. He is supposed to see Dr. Malachy Mood back next week in the interim he was seeing [place] for further evaluation of these areas to see if there is anything we can do to speed up the healing. The patient  did have in August 2022 a: Resection with hemicolectomy. Electronic Signature(s) Signed: 10/04/2021 4:56:21 PM By: Worthy Keeler PA-C Entered By: Worthy Keeler on 10/04/2021 16:56:21 -------------------------------------------------------------------------------- Paring/cutting 1 benign hyperkeratotic lesion Details Patient Name: Date of Service: Wyatt Meyer, Wyatt Meyer. 10/04/2021 2:45 PM Medical Record Number: [id number] Patient Account Number: [id number] Date of Birth/Sex: Treating RN: 1960/10/31 (61 y.o. [name] Primary Care Provider: Dimas Chyle Other Clinician: Referring Provider: Treating Provider/Extender: Welford Roche Weeks in Treatment: 0 Procedure Performed for: Non-Wound Location Performed By: Physician Worthy Keeler, PA Post Procedure Diagnosis Same as Pre-procedure Notes right plantar foot using #3 curette Electronic Signature(s) Signed: 10/04/2021 5:05:22 PM By: Worthy Keeler PA-C Signed: 10/04/2021 5:54:22 PM By: Baruch Gouty RN, BSN Entered By: Baruch Gouty on 10/04/2021 16:31:15 -------------------------------------------------------------------------------- Physical Exam Details Patient Name: Date of Service: Wyatt Son Meyer. 10/04/2021 2:45 PM Medical Record Number: [id number] Patient Account Number: [id number] Date of Birth/Sex: Treating RN: March 15, 1961 (61 y.o. [name] Primary Care Provider: Dimas Chyle Other Clinician: Referring Provider: Treating Provider/Extender: Welford Roche Weeks in Treatment: 0 Constitutional patient is hypertensive.. pulse regular and within target range for patient.[name] respirations regular, non-labored and within target range for patient.[name] temperature within target range for patient.. Well-nourished and well-hydrated in no acute distress. Eyes conjunctiva clear no eyelid edema noted. pupils equal round and reactive to light and accommodation. Ears, Nose, Mouth, and  Throat no gross abnormality of ear auricles or external auditory canals. normal hearing noted during conversation. mucus membranes moist. Respiratory normal breathing without difficulty. Cardiovascular 2+ dorsalis pedis/posterior tibialis pulses. no clubbing, cyanosis, significant edema, <3 sec cap refill. Musculoskeletal normal gait and posture. no significant deformity or arthritic changes, no loss or range of motion, no clubbing. Psychiatric this patient is able to make decisions and demonstrates good insight into disease process. Alert and Oriented x 3. pleasant and cooperative. Notes Upon inspection patient appears to be doing decently well currently in regard to his wounds in fact I feel like that they have actually begun to heal and if that is happened in just 1 week's time since he has been on hold with the chemotherapy that is actually excellent news. With  that being said he did quit smoking earlier in 2022 and again I think this is a very good thing for him at this point. Subsequently with regard to his feet he does have a lot of callus buildup and subsequently I am going to try to clear this away as well as the surface of the wounds and try to help this with healing more effectively and quickly. Electronic Signature(s) Signed: 10/04/2021 4:57:02 PM By: Worthy Keeler PA-C Entered By: Worthy Keeler on 10/04/2021 16:57:02 -------------------------------------------------------------------------------- Physician Orders Details Patient Name: Date of Service: Wyatt Son Meyer. 10/04/2021 2:45 PM Medical Record Number: [id number] Patient Account Number: [id number] Date of Birth/Sex: Treating RN: [birth year] (61 y.o. 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 - with Margarita Grizzle Return A Bathing/ Shower/ Hygiene May shower and wash wound with soap and water. Wound Treatment Wound #1 - Foot Wound Laterality: Plantar, Left Cleanser: Byram Ancillary Kit - 15 Day Supply (DME) (Generic) 1 x Per Day/30 Days Discharge Instructions: Use supplies as instructed; Kit contains: (15) Saline Bullets; (15) 3x3 Gauze; 15 pr Gloves Prim Dressing: Xeroform Occlusive Gauze Dressing, 4x4 in (DME) (Generic) 1 x Per Day/30 Days ary Discharge Instructions: Apply to wound bed as instructed Secondary Dressing: Woven Gauze Sponge, Non-Sterile 4x4 in (DME) (Generic) 1 x Per Day/30 Days Discharge Instructions: Apply over primary dressing as directed. Secured With: Child psychotherapist, Sterile 2x75 (in/in) (DME) (Generic) 1 x Per Day/30 Days Discharge Instructions: Secure with stretch gauze as directed. Secured With: Paper Tape, 2x10 (in/yd) (DME) (Generic) 1 x Per Day/30 Days Discharge Instructions: Secure dressing with tape as directed. Wound #2 - T Great oe Wound Laterality: Plantar, Right Cleanser: Byram Ancillary Kit - 15 Day Supply (DME) (Generic) 1 x Per Day/30 Days Discharge Instructions: Use supplies as instructed; Kit contains: (15) Saline Bullets; (15) 3x3 Gauze; 15 pr Gloves Prim Dressing: Xeroform Occlusive Gauze Dressing, 4x4 in (DME) (Generic) 1 x Per Day/30 Days ary Discharge Instructions: Apply to wound bed as instructed Secondary Dressing: Woven Gauze Sponge, Non-Sterile 4x4 in (DME) (Generic) 1 x Per Day/30 Days Discharge Instructions: Apply  over primary dressing as directed. Secured With: Child psychotherapist, Sterile 2x75 (in/in) (DME) (Generic) 1 x Per Day/30 Days Discharge Instructions: Secure with stretch gauze as directed. Secured With: Paper Tape, 2x10 (in/yd) (DME) (Generic) 1 x Per Day/30 Days Discharge Instructions: Secure dressing with tape as directed. Electronic Signature(s) Signed: 10/04/2021 5:05:22 PM By: Worthy Keeler PA-C Signed: 10/04/2021 5:54:22 PM By: Baruch Gouty RN, BSN Entered By: Baruch Gouty on 10/04/2021 16:30:17 -------------------------------------------------------------------------------- Problem List Details Patient Name: Date of Service: Wyatt Son Meyer. 10/04/2021 2:45 PM Medical Record Number: [id number] Patient Account Number: [id number] Date of Birth/Sex: Treating RN: May 04, 1961 (61 y.o. 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 10/04/2021 2:45 PM Medical Record Number: [id number] Patient Account Number: [id number] Date of Birth/Sex: Treating RN: 05-31-1961 (61 y.o. [name] Primary Care Provider: Dimas Chyle Other Clinician: Referring Provider: Treating Provider/Extender: Welford Roche Weeks in Treatment: 0 Subjective Chief Complaint Information obtained from Patient Left foot and right 1st toe ulcers History of Present Illness (HPI) 10/04/2021 on evaluation today patient presents for initial inspection here in the clinic concerning issues that he has been having with the plantar aspect of his feet bilaterally. Subsequently this seems to be in relation to the chemotherapy that he has been undergoing, Xeloda. Subsequently the patient had what appears to be as best I can tell 5 cycles I believe and then was placed on hold on 09/28/2021 due to the issues with the skin on the bottom of his feet. He has a history of prostate cancer status post prostatectomy. He also has peripheral neuropathy, diabetes mellitus type 2, iron deficiency anemia secondary to the colon cancer and bleeding that was associated. 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 Upon further review it actually appears he began cycle 6 of the Xeloda on 09/27/2021. And therefore was placed on hold. He has been using Neosporin to the ulcerated areas. He is supposed to see Dr. Malachy Mood back next week in the interim he was seeing [place] for further evaluation of these areas to see if there is anything we can do to speed up the healing. The patient did have in August 2022 a: Resection with hemicolectomy. Patient History Information obtained from Patient, Chart. Allergies codeine (Reaction:  nausea/vomiting) Family History Cancer - Siblings, Diabetes - Siblings,Mother, Heart Disease - Mother,Father, Hypertension - Siblings, Stroke - Mother, Thyroid Problems - Siblings, No family history of Hereditary Spherocytosis, Kidney Disease, Lung Disease, Seizures, Tuberculosis. Social History Former smoker - quit 03/2021, Marital Status - Married, Alcohol Use - Rarely, Drug Use - Prior History - TCH, Caffeine Use - Daily - coffee. Medical History Eyes Denies history of Cataracts, Glaucoma, Optic Neuritis Hematologic/Lymphatic Patient has history of Anemia Endocrine Patient has history of Type II Diabetes Denies history of Type I Diabetes Genitourinary Denies history of End Stage Renal Disease Integumentary (Skin) Denies history of History of Burn Neurologic Patient has history of Neuropathy Denies history of Dementia, Quadriplegia, Paraplegia, Seizure Disorder Oncologic Patient has history of Received Chemotherapy - oral chemo at present Denies history of Received Radiation Psychiatric Denies history of Anorexia/bulimia, Confinement Anxiety Patient is treated with Insulin. Blood sugar is tested. Hospitalization/Surgery History - laprascopic right hemicolectomy. - prostatectomy. - wisdom tooth extraction. Medical A Surgical History  Notes nd Cardiovascular hyperlipidemia Gastrointestinal colon CA Genitourinary prostate CA, ED, Hx kidney stone Review of Systems (ROS) Constitutional Symptoms (General Health) Complains or has symptoms of Fatigue. Denies complaints or symptoms of Fever, Chills. Eyes Complains or has symptoms of Glasses / Contacts. Denies complaints or symptoms of Dry Eyes, Vision Changes. Ear/Nose/Mouth/Throat Denies complaints or symptoms of Chronic sinus problems or rhinitis. Respiratory Denies complaints or symptoms of Chronic or frequent coughs, Shortness of Breath. Cardiovascular Denies complaints or symptoms of Chest pain. Endocrine Denies  complaints or symptoms of Heat/cold intolerance. Genitourinary Denies complaints or symptoms of Frequent urination. Integumentary (Skin) Complains or has symptoms of Wounds - bil feet. Musculoskeletal Complains or has symptoms of Muscle Weakness. Denies complaints or symptoms of Muscle Pain. Neurologic Complains or has symptoms of Numbness/parasthesias. Psychiatric Denies complaints or symptoms of Claustrophobia, Suicidal. Objective Constitutional patient is hypertensive.. pulse regular and within target range for patient.[name] respirations regular, non-labored and within target range for patient.[name] temperature within target range for patient.. Well-nourished and well-hydrated in no acute distress. Vitals Time Taken: 3:00 PM, Temperature: 98.7 F, Pulse: 82 bpm, Respiratory Rate: 18 breaths/min, Blood Pressure: 163/79 mmHg, Capillary Blood Glucose: 247 mg/dl. General Notes: glucose per pt report per sensor at present Eyes conjunctiva clear no eyelid edema noted. pupils equal round and reactive to light and accommodation. Ears, Nose, Mouth, and Throat no gross abnormality of ear auricles or external auditory canals. normal hearing noted during conversation. mucus membranes moist. Respiratory normal breathing without difficulty. Cardiovascular 2+ dorsalis pedis/posterior tibialis pulses. no clubbing, cyanosis, significant edema, Musculoskeletal normal gait and posture. no significant deformity or arthritic changes, no loss or range of motion, no clubbing. Psychiatric this patient is able to make decisions and demonstrates good insight into disease process. Alert and Oriented x 3. pleasant and cooperative. General Notes: Upon inspection patient appears to be doing decently well currently in regard to his wounds in fact I feel like that they have actually begun to heal and if that is happened in just 1 week's time since he has been on hold with the chemotherapy that is actually excellent  news. With that being said he did quit smoking earlier in 2022 and again I think this is a very good thing for him at this point. Subsequently with regard to his feet he does have a lot of callus buildup and subsequently I am going to try to clear this away as well as the surface of the wounds and try to help this with healing more effectively and quickly. Integumentary (Hair, Skin) Wound #1 status is Open. Original cause of wound was Gradually Appeared. The date acquired was: 09/24/2021. The wound is located on the [place]. The wound measures 0.4cm length x 1.8cm width x 0.1cm depth; 0.565cm^2 area and 0.057cm^3 volume. There is Fat Layer (Subcutaneous Tissue) exposed. There is no tunneling or undermining noted. There is a medium amount of serosanguineous drainage noted. The wound margin is thickened. There is large (67- 100%) pink granulation within the wound bed. There is no necrotic tissue within the wound bed. Wound #2 status is Open. Original cause of wound was Gradually Appeared. The date acquired was: 09/24/2021. The wound is located on the [employer]. The wound measures 0.3cm length x 0.6cm width x 0.1cm depth; 0.141cm^2 area and 0.014cm^3 volume. There is Fat Layer (Subcutaneous Tissue) exposed. There is no tunneling or undermining noted. There is a medium amount of serosanguineous drainage noted. The wound margin is thickened. There is large (67-100%) pink  granulation within the wound bed. There is no necrotic tissue within the wound bed. 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 There was a Excisional Skin/Subcutaneous Tissue Debridement with a total area of 2.5 sq cm performed by Worthy Keeler, PA. With the following instrument(s): Curette to remove Viable and Non-Viable tissue/material. Material removed includes Callus, Subcutaneous Tissue, and Skin: Epidermis. No specimens were taken. A time out was conducted at 16:15, prior to the start of the procedure. A Minimum amount of bleeding was controlled with Pressure. The procedure was tolerated well with a pain level of 0 throughout and a pain level of 0 following the procedure. Post Debridement Measurements: 0.4cm length x 1.8cm width x 0.1cm depth; 0.057cm^3 volume. Character of Wound/Ulcer Post Debridement is improved. Severity of Tissue Post Debridement is: Fat layer exposed. Post procedure Diagnosis Wound #1: Same as Pre-Procedure Wound #2 Pre-procedure diagnosis of Wound #2 is a Diabetic Wound/Ulcer of the Lower Extremity located on the Right,Plantar T Great .Severity of Tissue Pre oe Debridement is: Fat layer exposed. There was a Excisional Skin/Subcutaneous Tissue Debridement with a total area of 1 sq cm performed by Worthy Keeler, PA. With the following instrument(s): Curette to remove Viable and Non-Viable tissue/material. Material removed includes Callus, Subcutaneous Tissue, and Skin: Epidermis. No specimens were taken. A time out was conducted at 16:15, prior to the start of the procedure. A Minimum amount of bleeding was controlled with Pressure. The procedure was tolerated well with a pain level of 0 throughout and a pain level of 0 following the procedure. Post Debridement Measurements: 0.4cm length x 0.9cm width x 0.1cm depth; 0.028cm^3 volume. Character of Wound/Ulcer Post Debridement is improved. Severity of Tissue Post Debridement is: Fat  layer exposed. Post procedure Diagnosis Wound #2: Same as Pre-Procedure A Paring/cutting 1 benign hyperkeratotic lesion procedure was performed. by Worthy Keeler, PA. Post procedure Diagnosis Wound #: Same as Pre-Procedure Notes: right plantar foot using #3 curette Plan Follow-up Appointments: Return Appointment in 1 week. - with Glynn Octave Shower/ Hygiene: May shower and wash wound with soap and water. WOUND #1: - Foot Wound Laterality: Plantar, Left Cleanser: Byram Ancillary Kit - 15 Day Supply (DME) (Generic) 1 x Per Day/30 Days Discharge Instructions: Use supplies as instructed; Kit contains: (15) Saline Bullets; (15) 3x3 Gauze; 15 pr Gloves Prim Dressing: Xeroform Occlusive Gauze Dressing, 4x4 in (DME) (Generic) 1 x Per Day/30 Days ary Discharge Instructions: Apply to wound bed as instructed Secondary Dressing: Woven Gauze Sponge, Non-Sterile 4x4 in (DME) (Generic) 1 x Per Day/30 Days Discharge Instructions: Apply over primary dressing as directed. Secured With: Child psychotherapist, Sterile 2x75 (in/in) (DME) (Generic) 1 x Per Day/30 Days Discharge Instructions: Secure with stretch gauze as directed. Secured With: Paper Tape, 2x10 (in/yd) (DME) (Generic) 1 x Per Day/30 Days Discharge Instructions: Secure dressing with tape as directed. WOUND #2: - T Great Wound Laterality: Plantar, Right oe Cleanser: [facility] Ancillary Kit - 15 Day Supply (DME) (Generic) 1 x Per Day/30 Days Discharge Instructions:  Use supplies as instructed; Kit contains: (15) Saline Bullets; (15) 3x3 Gauze; 15 pr Gloves Prim Dressing: Xeroform Occlusive Gauze Dressing, 4x4 in (DME) (Generic) 1 x Per Day/30 Days ary Discharge Instructions: Apply to wound bed as instructed Secondary Dressing: Woven Gauze Sponge, Non-Sterile 4x4 in (DME) (Generic) 1 x Per Day/30 Days Discharge Instructions: Apply over primary dressing as directed. Secured With: Child psychotherapist, Sterile 2x75 (in/in)  (DME) (Generic) 1 x Per Day/30 Days Discharge Instructions: Secure with stretch gauze as directed. Secured With: Paper Tape, 2x10 (in/yd) (DME) (Generic) 1 x Per Day/30 Days Discharge Instructions: Secure dressing with tape as directed. 1. I would recommend currently that we go ahead and initiate treatment with Xeroform gauze dressings which I think is probably can to be a good way to go to provide some moisture and keep this from becoming so dry that seems to have been a critical issue here. 2. I am also can recommend that we have the patient cover this with ABD pads and roll gauze to secure in place this will give some help with padding as well and I think that also keep any tape off of his skin. 3. We will also get a go ahead and send a message to Dr. Benay Spice his oncologist as well. Just 1 to relay what we saw on how things are doing and see if we can coordinate with what is the best plan going forward to both help heal the wounds on his feet and ensure this does not worsen again when reinitiation of chemotherapy is undertaken. We will see patient back for reevaluation in 1 week here in the clinic. If anything worsens or changes patient will contact our office for additional recommendations. Electronic Signature(s) Signed: 10/04/2021 4:58:27 PM By: Worthy Keeler PA-C Entered By: Worthy Keeler on 10/04/2021 16:58:27 -------------------------------------------------------------------------------- HxROS Details Patient Name: Date of Service: Wyatt Son Meyer. 10/04/2021 2:45 PM Medical Record Number: [id number] Patient Account Number: [id number] Date of Birth/Sex: Treating RN: [birth year] (61 y.o. [name] Primary Care Provider: Dimas Chyle Other Clinician: Referring Provider: Treating Provider/Extender: Welford Roche Weeks in Treatment: 0 Information Obtained From Patient Chart Constitutional Symptoms (General Health) Complaints and Symptoms: Positive  for: Fatigue Negative for: Fever; Chills Eyes Complaints and Symptoms: Positive for: Glasses / Contacts Negative for: Dry Eyes; Vision Changes Medical History: Negative for: Cataracts; Glaucoma; Optic Neuritis Ear/Nose/Mouth/Throat Complaints and Symptoms: Negative for: Chronic sinus problems or rhinitis Respiratory Complaints and Symptoms: Negative for: Chronic or frequent coughs; Shortness of Breath Cardiovascular Complaints and Symptoms: Negative for: Chest pain Medical History: Past Medical History Notes: hyperlipidemia Endocrine Complaints and Symptoms: Negative for: Heat/cold intolerance Medical History: Positive for: Type II Diabetes Negative for: Type I Diabetes Time with diabetes: age 49 Treated with: Insulin Blood sugar tested every day: Yes Tested : has scensor Genitourinary Complaints and Symptoms: Negative for: Frequent urination Medical History: Negative for: End Stage Renal Disease Past Medical History Notes: prostate CA, ED, Hx kidney stone Integumentary (Skin) Complaints and Symptoms: Positive for: Wounds - bil feet Medical History: Negative for: History of Burn Musculoskeletal Complaints and Symptoms: Positive for: Muscle Weakness Negative for: Muscle Pain Neurologic Complaints and Symptoms: Positive for: Numbness/parasthesias Medical History: Positive for: Neuropathy Negative for: Dementia; Quadriplegia; Paraplegia; Seizure Disorder Psychiatric Complaints and Symptoms: Negative for: Claustrophobia; Suicidal Medical History: Negative for: Anorexia/bulimia; Confinement Anxiety Hematologic/Lymphatic Medical History: Positive for: Anemia Gastrointestinal Medical History: Past Medical History Notes: colon CA Immunological Oncologic Medical History: Positive for:  Received Chemotherapy - oral chemo at present Negative for: Received Radiation Immunizations Pneumococcal Vaccine: Received Pneumococcal Vaccination: Yes Received  Pneumococcal Vaccination On or After 60th Birthday: No Implantable Devices No devices added Hospitalization / Surgery History Type of Hospitalization/Surgery laprascopic right hemicolectomy prostatectomy wisdom tooth extraction Family and Social History Cancer: Yes - Siblings; Diabetes: Yes - Siblings,Mother; Heart Disease: Yes - Mother,Father; Hereditary Spherocytosis: No; Hypertension: Yes - Siblings; Kidney Disease: No; Lung Disease: No; Seizures: No; Stroke: Yes - Mother; Thyroid Problems: Yes - Siblings; Tuberculosis: No; Former smoker - quit 03/2021; Marital Status - Married; Alcohol Use: Rarely; Drug Use: Prior History - TCH; Caffeine Use: Daily - coffee; Financial Concerns: No; Food, Clothing or Shelter Needs: No; Support System Lacking: No; Transportation Concerns: No Electronic Signature(s) Signed: 10/04/2021 5:05:22 PM By: Worthy Keeler PA-C Signed: 10/04/2021 5:54:22 PM By: Baruch Gouty RN, BSN Entered By: Baruch Gouty on 10/04/2021 15:22:01 -------------------------------------------------------------------------------- SuperBill Details Patient Name: Date of Service: Wyatt Son Meyer. 10/04/2021 Medical Record Number: [id number] Patient Account Number: [id number] Date of Birth/Sex: Treating RN: Jan 14, 1961 (61 y.o. 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 L84 Corns and callosities Facility Procedures CPT4  Code: 47998721 Description: [place] VISIT-LEV 3 EST PT Modifier: 25 Quantity: 1 CPT4 Code: [id number] Description: 48592 - DEB SUBQ TISSUE 20 SQ CM/< ICD-10 Diagnosis Description 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 Modifier: Quantity: 1 CPT4 Code: [id number] Description: 03794 - PARE BENIGN LES; SGL ICD-10 Diagnosis Description L84 Corns and callosities Modifier: 59 Quantity: 1 Physician Procedures : CPT4 Code Description Modifier [id number] 22241 - WC PHYS LEVEL 4 - NEW PT 25 ICD-10 Diagnosis Description E11.621 Type 2 diabetes mellitus with foot ulcer L97.522 Non-pressure chronic ulcer of other part of left foot with fat layer exposed L97.512  Non-pressure chronic ulcer of other part of right foot with fat layer exposed C18.8 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

## 2021-10-05 NOTE — Progress Notes (Signed)
EMILIANO, WELSHANS ([id number]) Visit Report for 10/04/2021 Allergy List Details Patient Name: Date of Service: LADARIUS, SEUBERT D. 10/04/2021 2:45 PM Medical Record Number: [id number] Patient Account Number: [id number] Date of Birth/Sex: Treating RN: [birth year] (61 y.o. [name] Primary Care Claudine Stallings: Dimas Chyle Other Clinician: Referring Tanisia Yokley: Treating Yasuko Lapage/Extender: Welford Roche Weeks in Treatment: 0 Allergies Active Allergies codeine Reaction: nausea/vomiting Allergy Notes Electronic Signature(s) Signed: 10/04/2021 5:54:22 PM By: Baruch Gouty RN, BSN Entered By: Baruch Gouty on 10/04/2021 15:06:09 -------------------------------------------------------------------------------- Arrival Information Details Patient Name: Date of Service: [name] 10/04/2021 2:45 PM Medical Record Number: [id number] Patient Account Number: [id number] Date of Birth/Sex: Treating RN: 1961/04/17 (61 y.o. [name] Primary Care Reghan Thul: Dimas Chyle Other Clinician: Referring Robertson Colclough: Treating Halston Kintz/Extender: Lebron Quam in Treatment: 0 Visit Information Patient Arrived: Ambulatory Arrival Time: 14:52 Accompanied By: sister Transfer Assistance: None Patient Identification Verified: Yes Secondary Verification Process Completed: Yes Patient Requires Transmission-Based Precautions: No Patient Has Alerts: No Electronic Signature(s) Signed: 10/04/2021 5:54:22 PM By: Baruch Gouty RN, BSN Entered By: Baruch Gouty on 10/04/2021 15:03:38 -------------------------------------------------------------------------------- Clinic Level of Care Assessment Details Patient Name: Date of Service: [place], [place] D. 10/04/2021 2:45 PM Medical Record Number: [id number] Patient Account Number: [id number] Date of Birth/Sex: Treating RN: December 22, 1960 (61 y.o. [name] Primary Care Cedra Villalon: Dimas Chyle Other  Clinician: Referring Ronica Vivian: Treating Brealynn Contino/Extender: Lebron Quam in Treatment: 0 Clinic Level of Care Assessment Items TOOL 1 Quantity Score []  - 0 Use when EandM and Procedure is performed on INITIAL visit ASSESSMENTS - Nursing Assessment / Reassessment X- 1 20 General Physical Exam (combine w/ comprehensive assessment (listed just below) when performed on new pt. evals) X- 1 25 Comprehensive Assessment (HX, ROS, Risk Assessments, Wounds Hx, etc.) ASSESSMENTS - Wound and Skin Assessment / Reassessment []  - 0 Dermatologic / Skin Assessment (not related to wound area) ASSESSMENTS - Ostomy and/or Continence Assessment and Care []  - 0 Incontinence Assessment and Management []  - 0 Ostomy Care Assessment and Management (repouching, etc.) PROCESS - Coordination of Care X - Simple Patient / Family Education for ongoing care 1 15 []  - 0 Complex (extensive) Patient / Family Education for ongoing care X- 1 10 Staff obtains Programmer, systems, Records, T Results / Process Orders est []  - 0 Staff telephones HHA, Nursing Homes / Clarify orders / etc []  - 0 Routine Transfer to another Facility (non-emergent condition) []  - 0 Routine Hospital Admission (non-emergent condition) X- 1 15 New Admissions / Biomedical engineer / Ordering NPWT Apligraf, etc. , []  - 0 Emergency Hospital Admission (emergent condition) PROCESS - Special Needs []  - 0 Pediatric / Minor Patient Management []  - 0 Isolation Patient Management []  - 0 Hearing / Language / Visual special needs []  - 0 Assessment of Community assistance (transportation, D/C planning, etc.) []  - 0 Additional assistance / Altered mentation []  - 0 Support Surface(s) Assessment (bed, cushion, seat, etc.) INTERVENTIONS - Miscellaneous []  - 0 External ear exam []  - 0 Patient Transfer (multiple staff / Civil Service fast streamer / Similar devices) []  - 0 Simple Staple / Suture removal (25 or less) []  - 0 Complex Staple /  Suture removal (26 or more) []  - 0 Hypo/Hyperglycemic Management (do not check if billed separately) X- 1 15 Ankle / Brachial Index (ABI) - do not check if billed separately Has the patient been seen at the hospital within the last three years: Yes Total Score: 100  Level Of Care: New/Established - Level 3 Electronic Signature(s) Signed: 10/04/2021 5:54:22 PM By: Baruch Gouty RN, BSN Entered By: Baruch Gouty on 10/04/2021 16:20:18 -------------------------------------------------------------------------------- Encounter Discharge Information Details Patient Name: Date of Service: [name] 10/04/2021 2:45 PM Medical Record Number: [id number] Patient Account Number: [id number] Date of Birth/Sex: Treating RN: 31-Oct-1960 (61 y.o. [name] Primary Care Irem Stoneham: Dimas Chyle Other Clinician: Referring Jethro Radke: Treating Florita Nitsch/Extender: Lebron Quam in Treatment: 0 Encounter Discharge Information Items Post Procedure Vitals Discharge Condition: Stable Temperature (F): 98.7 Ambulatory Status: Ambulatory Pulse (bpm): 82 Discharge Destination: Home Respiratory Rate (breaths/min): 18 Transportation: Private Auto Blood Pressure (mmHg): 163/79 Accompanied By: sister Schedule Follow-up Appointment: Yes Clinical Summary of Care: Patient Declined Electronic Signature(s) Signed: 10/04/2021 5:54:22 PM By: Baruch Gouty RN, BSN Entered By: Baruch Gouty on 10/04/2021 16:49:16 -------------------------------------------------------------------------------- Lower Extremity Assessment Details Patient Name: Date of Service: [name] 10/04/2021 2:45 PM Medical Record Number: [id number] Patient Account Number: [id number] Date of Birth/Sex: Treating RN: Feb 20, 1961 (61 y.o. [name] Primary Care Shahad Mazurek: Dimas Chyle Other Clinician: Referring Raney Antwine: Treating Taquila Leys/Extender: Welford Roche Weeks in Treatment: 0 Edema Assessment Assessed: [Left: No] [Right: No] Edema: [Left: No] [Right: No] Calf Left: Right: Point of Measurement: From Medial Instep 39 cm 39.2 cm Ankle Left: Right: Point of Measurement: From Medial Instep 24.9 cm 24.8 cm Vascular Assessment Pulses: Dorsalis Pedis Palpable: [Left:Yes] [Right:Yes] Blood Pressure: Brachial: [Left:163] [Right:163] Dorsalis Pedis: 196 Ankle: [Left:Posterior Tibial: 166] Posterior Tibial: 180 Ankle Brachial Index: [Left:1.02] [Right:1.20] Notes left DP pulse noncompressible Electronic Signature(s) Signed: 10/04/2021 5:54:22 PM By: Baruch Gouty RN, BSN Entered By: Baruch Gouty on 10/04/2021 15:36:28 -------------------------------------------------------------------------------- Multi Wound Chart Details Patient Name: Date of Service: [name] 10/04/2021 2:45 PM Medical Record Number: [id number] Patient Account Number: [id number] Date of Birth/Sex: Treating RN: 07-Mar-1961 (61 y.o. [name] Primary Care Aryianna Earwood: Dimas Chyle Other Clinician: Referring Xiomara Sevillano: Treating Slade Pierpoint/Extender: Welford Roche Weeks in Treatment: 0 Vital Signs Height(in): Capillary Blood Glucose(mg/dl): 247 Weight(lbs): Pulse(bpm): 87 Body Mass Index(BMI): Blood Pressure(mmHg): 163/79 Temperature(F): 98.7 Respiratory Rate(breaths/min): 18 Photos: [N/A:N/A] Left, Plantar Foot Right, Plantar T Great oe N/A Wound Location: Gradually Appeared Gradually Appeared N/A Wounding Event: Diabetic Wound/Ulcer of the Lower Diabetic Wound/Ulcer of the Lower N/A Primary Etiology: Extremity Extremity Medication Related Medication Related N/A Secondary Etiology: Anemia, Type II Diabetes, Neuropathy, Anemia, Type II Diabetes, Neuropathy, N/A Comorbid History: Received Chemotherapy Received Chemotherapy 09/24/2021 09/24/2021 N/A Date Acquired: 0 0 N/A Weeks of Treatment: Open Open N/A Wound  Status: 0.4x1.8x0.1 0.3x0.6x0.1 N/A Measurements L x W x D (cm) 0.565 0.141 N/A A (cm) : rea 0.057 0.014 N/A Volume (cm) : 0.00% 0.00% N/A % Reduction in A rea: 0.00% 0.00% N/A % Reduction in Volume: Grade 1 Grade 1 N/A Classification: Medium Medium N/A Exudate A mount: Serosanguineous Serosanguineous N/A Exudate Type: red, brown red, brown N/A Exudate Color: Thickened Thickened N/A Wound Margin: Large (67-100%) Large (67-100%) N/A Granulation A mount: Pink Pink N/A Granulation Quality: None Present (0%) None Present (0%) N/A Necrotic A mount: Fat Layer (Subcutaneous Tissue): Yes Fat Layer (Subcutaneous Tissue): Yes N/A Exposed Structures: Fascia: No Fascia: No Tendon: No Tendon: No Muscle: No Muscle: No Joint: No Joint: No Bone: No Bone: No Small (1-33%) Small (1-33%) N/A Epithelialization: Debridement - Excisional Debridement - Excisional N/A Debridement: Pre-procedure Verification/Time Out 16:15 16:15 N/A Taken: Callus, Subcutaneous Callus, Subcutaneous N/A Tissue Debrided: Skin/Subcutaneous Tissue  Skin/Subcutaneous Tissue N/A Level: 2.5 1 N/A Debridement A (sq cm): rea Curette Curette N/A Instrument: Minimum Minimum N/A Bleeding: Pressure Pressure N/A Hemostasis A chieved: 0 0 N/A Procedural Pain: 0 0 N/A Post Procedural Pain: Procedure was tolerated well Procedure was tolerated well N/A Debridement Treatment Response: 0.4x1.8x0.1 0.4x0.9x0.1 N/A Post Debridement Measurements L x W x D (cm) 0.057 0.028 N/A Post Debridement Volume: (cm) Debridement Debridement N/A Procedures Performed: Treatment Notes Wound #1 (Foot) Wound Laterality: Plantar, Left Cleanser Byram Ancillary Kit - 15 Day Supply Discharge Instruction: Use supplies as instructed; Kit contains: (15) Saline Bullets; (15) 3x3 Gauze; 15 pr Gloves Peri-Wound Care Topical Primary Dressing Xeroform Occlusive Gauze Dressing, 4x4 in Discharge Instruction: Apply to wound bed as  instructed Secondary Dressing Woven Gauze Sponge, Non-Sterile 4x4 in Discharge Instruction: Apply over primary dressing as directed. Secured With Conforming Stretch Gauze Bandage, Sterile 2x75 (in/in) Discharge Instruction: Secure with stretch gauze as directed. Paper Tape, 2x10 (in/yd) Discharge Instruction: Secure dressing with tape as directed. Compression Wrap Compression Stockings Add-Ons Wound #2 (Toe Great) Wound Laterality: Plantar, Right Cleanser Byram Ancillary Kit - 15 Day Supply Discharge Instruction: Use supplies as instructed; Kit contains: (15) Saline Bullets; (15) 3x3 Gauze; 15 pr Gloves Peri-Wound Care Topical Primary Dressing Xeroform Occlusive Gauze Dressing, 4x4 in Discharge Instruction: Apply to wound bed as instructed Secondary Dressing Woven Gauze Sponge, Non-Sterile 4x4 in Discharge Instruction: Apply over primary dressing as directed. Secured With Conforming Stretch Gauze Bandage, Sterile 2x75 (in/in) Discharge Instruction: Secure with stretch gauze as directed. Paper Tape, 2x10 (in/yd) Discharge Instruction: Secure dressing with tape as directed. Compression Wrap Compression Stockings Add-Ons Electronic Signature(s) Signed: 10/04/2021 5:54:22 PM By: Baruch Gouty RN, BSN Entered By: Baruch Gouty on 10/04/2021 16:53:55 -------------------------------------------------------------------------------- Multi-Disciplinary Care Plan Details Patient Name: Date of Service: [name] 10/04/2021 2:45 PM Medical Record Number: [id number] Patient Account Number: [id number] Date of Birth/Sex: Treating RN: 1961-08-13 (61 y.o. [name] Primary Care Olegario Emberson: Dimas Chyle Other Clinician: Referring Infinity Jeffords: Treating Zolton Dowson/Extender: Lebron Quam in Treatment: 0 Multidisciplinary Care Plan reviewed with physician Active Inactive Abuse / Safety / Falls / Self Care Management Nursing Diagnoses: History of  Falls Potential for falls Goals: Patient/caregiver will verbalize/demonstrate measures taken to prevent injury and/or falls Date Initiated: 10/04/2021 Target Resolution Date: 11/01/2021 Goal Status: Active Interventions: Assess fall risk on admission and as needed Notes: Nutrition Nursing Diagnoses: Impaired glucose control: actual or potential Potential for alteratiion in Nutrition/Potential for imbalanced nutrition Goals: Patient/caregiver will maintain therapeutic glucose control Date Initiated: 10/04/2021 Target Resolution Date: 11/01/2021 Goal Status: Active Interventions: Assess HgA1c results as ordered upon admission and as needed Assess patient nutrition upon admission and as needed per policy Provide education on elevated blood sugars and impact on wound healing Treatment Activities: Patient referred to Primary Care Physician for further nutritional evaluation : 10/04/2021 Notes: Wound/Skin Impairment Nursing Diagnoses: Impaired tissue integrity Knowledge deficit related to ulceration/compromised skin integrity Goals: Patient/caregiver will verbalize understanding of skin care regimen Date Initiated: 10/04/2021 Target Resolution Date: 11/01/2021 Goal Status: Active Ulcer/skin breakdown will have a volume reduction of 30% by week 4 Date Initiated: 10/04/2021 Target Resolution Date: 11/01/2021 Goal Status: Active Interventions: Assess patient/caregiver ability to obtain necessary supplies Assess patient/caregiver ability to perform ulcer/skin care regimen upon admission and as needed Assess ulceration(s) every visit Provide education on ulcer and skin care Treatment Activities: Skin care regimen initiated : 10/04/2021 Topical wound management initiated : 10/04/2021 Notes: Electronic Signature(s) Signed: 10/04/2021 5:54:22 PM  By: Baruch Gouty RN, BSN Entered By: Baruch Gouty on 10/04/2021  16:18:03 -------------------------------------------------------------------------------- Pain Assessment Details Patient Name: Date of Service: [name] 10/04/2021 2:45 PM Medical Record Number: [id number] Patient Account Number: [id number] Date of Birth/Sex: Treating RN: 1961-03-25 (61 y.o. [name] Primary Care Jayme Cham: Dimas Chyle Other Clinician: Referring Jaquelin Meaney: Treating Dillon Livermore/Extender: Welford Roche Weeks in Treatment: 0 Active Problems Location of Pain Severity and Description of Pain Patient Has Paino No Site Locations Rate the pain. Current Pain Level: 0 Pain Management and Medication Current Pain Management: Electronic Signature(s) Signed: 10/04/2021 5:54:22 PM By: Baruch Gouty RN, BSN Entered By: Baruch Gouty on 10/04/2021 15:41:45 -------------------------------------------------------------------------------- Patient/Caregiver Education Details Patient Name: Date of Service: [name] 1/11/2023andnbsp2:45 PM Medical Record Number: [id number] Patient Account Number: [id number] Date of Birth/Gender: Treating RN: 26-Jul-1961 (61 y.o. [name] Primary Care Physician: Dimas Chyle Other Clinician: Referring Physician: Treating Physician/Extender: Lebron Quam in Treatment: 0 Education Assessment Education Provided To: Patient Education Topics Provided Elevated Blood Sugar/ Impact on Healing: Handouts: Elevated Blood Sugars: How Do They Affect Wound Healing Methods: Explain/Verbal, Printed Responses: Reinforcements needed, State content correctly [place]: o Handouts: Welcome T [facility] [place] o Methods: Explain/Verbal, Printed Responses: Reinforcements needed, State content correctly Wound/Skin Impairment: Handouts: Caring for Your Ulcer, Skin Care Do's and Dont's Methods: Explain/Verbal, Printed Responses: Reinforcements needed,  State content correctly Electronic Signature(s) Signed: 10/04/2021 5:54:22 PM By: Baruch Gouty RN, BSN Entered By: Baruch Gouty on 10/04/2021 16:19:32 -------------------------------------------------------------------------------- Wound Assessment Details Patient Name: Date of Service: [name] 10/04/2021 2:45 PM Medical Record Number: [id number] Patient Account Number: [id number] Date of Birth/Sex: Treating RN: 10-06-1960 (61 y.o. [name] Primary Care Dustina Scoggin: Dimas Chyle Other Clinician: Referring Briante Loveall: Treating Escher Harr/Extender: Welford Roche Weeks in Treatment: 0 Wound Status Wound Number: 1 Primary Etiology: Diabetic Wound/Ulcer of the Lower Extremity Wound Location: Left, Plantar Foot Secondary Medication Related Etiology: Wounding Event: Gradually Appeared Wound Status: Open Date Acquired: 09/24/2021 Comorbid History: Anemia, Type II Diabetes, Neuropathy, Received Weeks Of Treatment: 0 Chemotherapy Clustered Wound: No Photos Wound Measurements Length: (cm) 0.4 Width: (cm) 1.8 Depth: (cm) 0.1 Area: (cm) 0.565 Volume: (cm) 0.057 Wound Description Classification: Grade 1 Wound Margin: Thickened Exudate Amount: Medium Exudate Type: Serosanguineous Exudate Color: red, brown Foul Odor After Cleansing: Slough/Fibrino % Reduction in Area: 0% % Reduction in Volume: 0% Epithelialization: Small (1-33%) Tunneling: No Undermining: No No No Wound Bed Granulation Amount: Large (67-100%) Exposed Structure Granulation Quality: Pink Fascia Exposed: No Necrotic Amount: None Present (0%) Fat Layer (Subcutaneous Tissue) Exposed: Yes Tendon Exposed: No Muscle Exposed: No Joint Exposed: No Bone Exposed: No Treatment Notes Wound #1 (Foot) Wound Laterality: Plantar, Left Cleanser Byram Ancillary Kit - 15 Day Supply Discharge Instruction: Use supplies as instructed; Kit contains: (15) Saline Bullets; (15) 3x3 Gauze; 15  pr Gloves Peri-Wound Care Topical Primary Dressing Xeroform Occlusive Gauze Dressing, 4x4 in Discharge Instruction: Apply to wound bed as instructed Secondary Dressing Woven Gauze Sponge, Non-Sterile 4x4 in Discharge Instruction: Apply over primary dressing as directed. Secured With Conforming Stretch Gauze Bandage, Sterile 2x75 (in/in) Discharge Instruction: Secure with stretch gauze as directed. Paper Tape, 2x10 (in/yd) Discharge Instruction: Secure dressing with tape as directed. Compression Wrap Compression Stockings Add-Ons Electronic Signature(s) Signed: 10/04/2021 5:54:22 PM By: Baruch Gouty RN, BSN Signed: 10/05/2021 10:42:23 AM By: Sandre Kitty Entered By: Sandre Kitty on 10/04/2021 15:43:23 --------------------------------------------------------------------------------  Wound Assessment Details Patient Name: Date of Service: TRUONG, [place] D. 10/04/2021 2:45 PM Medical Record Number: [id number] Patient Account Number: [id number] Date of Birth/Sex: Treating RN: [birth year] (61 y.o. [name] Primary Care Rainn Zupko: Dimas Chyle Other Clinician: Referring Yorel Redder: Treating Devin Foskey/Extender: Welford Roche Weeks in Treatment: 0 Wound Status Wound Number: 2 Primary Etiology: Diabetic Wound/Ulcer of the Lower Extremity Wound Location: Right, Plantar T Great oe Secondary Medication Related Etiology: Wounding Event: Gradually Appeared Wound Status: Open Date Acquired: 09/24/2021 Comorbid History: Anemia, Type II Diabetes, Neuropathy, Received Weeks Of Treatment: 0 Chemotherapy Clustered Wound: No Photos Wound Measurements Length: (cm) 0.3 Width: (cm) 0.6 Depth: (cm) 0.1 Area: (cm) 0.141 Volume: (cm) 0.014 % Reduction in Area: 0% % Reduction in Volume: 0% Epithelialization: Small (1-33%) Tunneling: No Undermining: No Wound Description Classification: Grade 1 Wound Margin: Thickened Exudate Amount: Medium Exudate  Type: Serosanguineous Exudate Color: red, brown Foul Odor After Cleansing: No Slough/Fibrino No Wound Bed Granulation Amount: Large (67-100%) Exposed Structure Granulation Quality: Pink Fascia Exposed: No Necrotic Amount: None Present (0%) Fat Layer (Subcutaneous Tissue) Exposed: Yes Tendon Exposed: No Muscle Exposed: No Joint Exposed: No Bone Exposed: No Treatment Notes Wound #2 (Toe Great) Wound Laterality: Plantar, Right Cleanser Byram Ancillary Kit - 15 Day Supply Discharge Instruction: Use supplies as instructed; Kit contains: (15) Saline Bullets; (15) 3x3 Gauze; 15 pr Gloves Peri-Wound Care Topical Primary Dressing Xeroform Occlusive Gauze Dressing, 4x4 in Discharge Instruction: Apply to wound bed as instructed Secondary Dressing Woven Gauze Sponge, Non-Sterile 4x4 in Discharge Instruction: Apply over primary dressing as directed. Secured With Conforming Stretch Gauze Bandage, Sterile 2x75 (in/in) Discharge Instruction: Secure with stretch gauze as directed. Paper Tape, 2x10 (in/yd) Discharge Instruction: Secure dressing with tape as directed. Compression Wrap Compression Stockings Add-Ons Electronic Signature(s) Signed: 10/04/2021 5:54:22 PM By: Baruch Gouty RN, BSN Signed: 10/05/2021 10:42:23 AM By: Sandre Kitty Entered By: Sandre Kitty on 10/04/2021 15:44:07 -------------------------------------------------------------------------------- Vitals Details Patient Name: Date of Service: [name] 10/04/2021 2:45 PM Medical Record Number: [id number] Patient Account Number: [id number] Date of Birth/Sex: Treating RN: 1961/09/06 (61 y.o. [name] Primary Care Islam Villescas: Dimas Chyle Other Clinician: Referring Christiana Gurevich: Treating Unnamed Zeien/Extender: Welford Roche Weeks in Treatment: 0 Vital Signs Time Taken: 15:00 Temperature (F): 98.7 Pulse (bpm): 82 Respiratory Rate (breaths/min): 18 Blood Pressure (mmHg):  163/79 Capillary Blood Glucose (mg/dl): 247 Reference Range: 80 - 120 mg / dl Notes glucose per pt report per sensor at present Electronic Signature(s) Signed: 10/04/2021 5:54:22 PM By: Baruch Gouty RN, BSN Entered By: Baruch Gouty on 10/04/2021 15:05:41

## 2021-10-09 ENCOUNTER — Other Ambulatory Visit: Payer: Self-pay | Admitting: Family Medicine

## 2021-10-10 ENCOUNTER — Other Ambulatory Visit: Payer: Self-pay

## 2021-10-10 ENCOUNTER — Other Ambulatory Visit (HOSPITAL_COMMUNITY): Payer: Self-pay

## 2021-10-10 ENCOUNTER — Inpatient Hospital Stay: Payer: BC Managed Care – PPO | Admitting: Nurse Practitioner

## 2021-10-10 ENCOUNTER — Encounter: Payer: Self-pay | Admitting: Nurse Practitioner

## 2021-10-10 VITALS — BP 111/87 | HR 98 | Temp 98.1°F | Resp 18 | Ht 73.0 in | Wt 241.2 lb

## 2021-10-10 DIAGNOSIS — C180 Malignant neoplasm of cecum: Secondary | ICD-10-CM | POA: Diagnosis not present

## 2021-10-10 DIAGNOSIS — C182 Malignant neoplasm of ascending colon: Secondary | ICD-10-CM | POA: Diagnosis not present

## 2021-10-10 NOTE — Progress Notes (Signed)
[facility] OFFICE PROGRESS NOTE   Diagnosis: Colon cancer  INTERVAL HISTORY:   Wyatt Meyer returns as scheduled.  He has completed 5 cycles of Xeloda.  Xeloda was placed on hold 09/28/2021 due to hand-foot syndrome.  He in general is feeling well.  Feet feel better.  Objective:  Vital signs in last 24 hours:  Blood pressure 111/87, pulse 98, temperature 98.1 F (36.7 C), temperature source Oral, resp. rate 18, height 6' 1" (1.854 m), weight 241 lb 3.2 oz (109.4 kg), SpO2 100 %.    HEENT: Mild white coating over tongue.  No buccal thrush. Resp: Lungs clear bilaterally. Cardio: Regular rate and rhythm. GI: No hepatomegaly. Vascular: No leg edema. Skin: Palms with hyperpigmentation, dryness.  The linear area of ulceration on the right sole base of the right great toe have healed.  Persistent ulceration sole of the left foot, signs of healing.   Lab Results:  Lab Results  Component Value Date   WBC 9.4 09/28/2021   HGB 10.5 (L) 09/28/2021   HCT 33.6 (L) 09/28/2021   MCV 101.8 (H) 09/28/2021   PLT 203 09/28/2021   NEUTROABS 5.9 09/28/2021    Imaging:  No results found.  Medications: I have reviewed the patient's current medications.  Assessment/Plan: Colon cancer, cecum, stage IIb (T4a N0 M0), status post a laparoscopic right colectomy 05/03/2021 0/19 lymph nodes, no perineural or lymphovascular invasion, negative resection margins, no tumor deposits, tumor extends into subserosal connective tissue with microscopic involvement of the serosa, MSS, no loss of mismatch repair protein expression CT the abdomen/pelvis 02/03/2021-no bowel obstruction, no adenopathy, probable fatty liver CT chest 02/14/2021-2 mm right middle lobe and 6 mm left upper lobe fissural nodule, nonspecific Elevated preoperative CEA, repeat CEA 06/05/2021-4.3 Guardant Reveal 06/05/2021-negative Cycle 1 adjuvant Xeloda 06/05/2021 Cycle 2 Xeloda 06/26/2021--reported he was still taking Xeloda  07/14/2021, instructed to stop further Xeloda with the plan to resume with cycle 3 on 07/24/2021 Cycle 3 Xeloda 07/24/2021 Cycle 4 Xeloda 08/14/2021-reported he was still taking Xeloda 08/31/2021, instructed to stop further Xeloda with the plan to resume with cycle 5 on 09/08/2021 Guardant reveal 08/31/2021-negative Cycle 5 Xeloda 09/08/2021 Xeloda placed on hold due to hand-foot syndrome 09/28/2021   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  He has completed 5 cycles of Xeloda.  Xeloda has been on hold due to progressive hand-foot syndrome with several areas of ulceration involving both feet.  The areas of ulceration on the right sole have healed.  He has persistent ulceration on the left sole.  He will continue to hold Xeloda and return for an office visit in 1 week.  The overall plan is to resume Xeloda at a 50% dose reduction once the ulceration has healed.    Ned Card ANP/GNP-BC   10/10/2021  8:43 AM

## 2021-10-11 ENCOUNTER — Encounter (HOSPITAL_BASED_OUTPATIENT_CLINIC_OR_DEPARTMENT_OTHER): Payer: BC Managed Care – PPO | Admitting: Internal Medicine

## 2021-10-11 DIAGNOSIS — E11621 Type 2 diabetes mellitus with foot ulcer: Secondary | ICD-10-CM | POA: Diagnosis not present

## 2021-10-11 NOTE — Progress Notes (Signed)
Wyatt, Meyer ([id number]) Visit Report for 10/11/2021 Arrival Information Details Patient Name: Date of Service: [name], [name] 10/11/2021 7:30 A M Medical Record Number: [id number] Patient Account Number: [id number] Date of Birth/Sex: Treating RN: 09-05-1961 (61 y.o. Wyatt Meyer, Wyatt Meyer Primary Care Dereona Kolodny: Dimas Chyle Other Clinician: Referring Keely Drennan: Treating Lylla Eifler/Extender: Donalee Citrin in Treatment: 1 Visit Information History Since Last Visit Added or deleted any medications: No Patient Arrived: Ambulatory Any new allergies or adverse reactions: No Arrival Time: 07:48 Had a fall or experienced change in No Accompanied By: family activities of daily living that may affect Transfer Assistance: None risk of falls: Patient Identification Verified: Yes Signs or symptoms of abuse/neglect since last visito No Secondary Verification Process Completed: Yes Hospitalized since last visit: No Patient Requires Transmission-Based Precautions: No Implantable device outside of the clinic excluding No Patient Has Alerts: No cellular tissue based products placed in the center since last visit: Has Dressing in Place as Prescribed: Yes Pain Present Now: No Electronic Signature(s) Signed: 10/11/2021 4:19:33 PM By: Sandre Kitty Entered By: Sandre Kitty on 10/11/2021 07:48:35 -------------------------------------------------------------------------------- Clinic Level of Care Assessment Details Patient Name: Date of Service: [name], [name] 10/11/2021 7:30 A M Medical Record Number: [id number] Patient Account Number: [id number] Date of Birth/Sex: Treating RN: 27-Sep-1960 (61 y.o. Wyatt Meyer Primary Care Maaliyah Adolph: Dimas Chyle Other Clinician: Referring Garnell Phenix: Treating Adreana Coull/Extender: Donalee Citrin in Treatment: 1 Clinic Level of Care Assessment Items TOOL 4 Quantity Score X- 1 0 Use when only an  EandM is performed on FOLLOW-UP visit ASSESSMENTS - Nursing Assessment / Reassessment X- 1 10 Reassessment of Co-morbidities (includes updates in patient status) X- 1 5 Reassessment of Adherence to Treatment Plan ASSESSMENTS - Wound and Skin A ssessment / Reassessment []  - 0 Simple Wound Assessment / Reassessment - one wound X- 2 5 Complex Wound Assessment / Reassessment - multiple wounds []  - 0 Dermatologic / Skin Assessment (not related to wound area) ASSESSMENTS - Focused Assessment []  - 0 Circumferential Edema Measurements - multi extremities []  - 0 Nutritional Assessment / Counseling / Intervention []  - 0 Lower Extremity Assessment (monofilament, tuning fork, pulses) []  - 0 Peripheral Arterial Disease Assessment (using hand held doppler) ASSESSMENTS - Ostomy and/or Continence Assessment and Care []  - 0 Incontinence Assessment and Management []  - 0 Ostomy Care Assessment and Management (repouching, etc.) PROCESS - Coordination of Care []  - 0 Simple Patient / Family Education for ongoing care X- 1 20 Complex (extensive) Patient / Family Education for ongoing care []  - 0 Staff obtains Programmer, systems, Records, T Results / Process Orders est []  - 0 Staff telephones HHA, Nursing Homes / Clarify orders / etc []  - 0 Routine Transfer to another Facility (non-emergent condition) []  - 0 Routine Hospital Admission (non-emergent condition) []  - 0 New Admissions / Biomedical engineer / Ordering NPWT Apligraf, etc. , []  - 0 Emergency Hospital Admission (emergent condition) []  - 0 Simple Discharge Coordination []  - 0 Complex (extensive) Discharge Coordination PROCESS - Special Needs []  - 0 Pediatric / Minor Patient Management []  - 0 Isolation Patient Management []  - 0 Hearing / Language / Visual special needs []  - 0 Assessment of Community assistance (transportation, D/C planning, etc.) []  - 0 Additional assistance / Altered mentation []  - 0 Support Surface(s)  Assessment (bed, cushion, seat, etc.) INTERVENTIONS - Wound Cleansing / Measurement []  - 0 Simple Wound Cleansing - one wound X- 2 5 Complex Wound Cleansing - multiple wounds X-  1 5 Wound Imaging (photographs - any number of wounds) []  - 0 Wound Tracing (instead of photographs) []  - 0 Simple Wound Measurement - one wound X- 2 5 Complex Wound Measurement - multiple wounds INTERVENTIONS - Wound Dressings X - Small Wound Dressing one or multiple wounds 1 10 X- 1 15 Medium Wound Dressing one or multiple wounds []  - 0 Large Wound Dressing one or multiple wounds []  - 0 Application of Medications - topical []  - 0 Application of Medications - injection INTERVENTIONS - Miscellaneous []  - 0 External ear exam []  - 0 Specimen Collection (cultures, biopsies, blood, body fluids, etc.) []  - 0 Specimen(s) / Culture(s) sent or taken to Lab for analysis []  - 0 Patient Transfer (multiple staff / Civil Service fast streamer / Similar devices) []  - 0 Simple Staple / Suture removal (25 or less) []  - 0 Complex Staple / Suture removal (26 or more) []  - 0 Hypo / Hyperglycemic Management (close monitor of Blood Glucose) []  - 0 Ankle / Brachial Index (ABI) - do not check if billed separately X- 1 5 Vital Signs Has the patient been seen at the hospital within the last three years: Yes Total Score: 100 Level Of Care: New/Established - Level 3 Electronic Signature(s) Signed: 10/11/2021 4:38:27 PM By: Lorrin Jackson Entered By: Lorrin Jackson on 10/11/2021 08:10:53 -------------------------------------------------------------------------------- Encounter Discharge Information Details Patient Name: Date of Service: Wyatt Meyer D. 10/11/2021 7:30 A M Medical Record Number: [id number] Patient Account Number: [id number] Date of Birth/Sex: Treating RN: [birth year] (61 y.o. Wyatt Meyer Primary Care Ziyana Morikawa: Dimas Chyle Other Clinician: Referring Ihor Meinzer: Treating Maryann Mccall/Extender: Donalee Citrin in Treatment: 1 Encounter Discharge Information Items Discharge Condition: Stable Ambulatory Status: Ambulatory Discharge Destination: Home Transportation: Private Auto Accompanied By: Sisters Schedule Follow-up Appointment: Yes Clinical Summary of Care: Provided on 10/11/2021 Form Type Recipient Paper Patient Patient Electronic Signature(s) Signed: 10/11/2021 4:38:27 PM By: Lorrin Jackson Entered By: Lorrin Jackson on 10/11/2021 08:26:02 -------------------------------------------------------------------------------- Lower Extremity Assessment Details Patient Name: Date of Service: Wyatt Meyer D. 10/11/2021 7:30 A M Medical Record Number: [id number] Patient Account Number: [id number] Date of Birth/Sex: Treating RN: Feb 18, 1961 (61 y.o. Wyatt Meyer Primary Care Cassady Turano: Dimas Chyle Other Clinician: Referring Binnie Droessler: Treating Kayhan Boardley/Extender: Donalee Citrin in Treatment: 1 Edema Assessment Assessed: Shirlyn Goltz: Yes] Patrice Paradise: Yes] Edema: [Left: No] [Right: No] Calf Left: Right: Point of Measurement: From Medial Instep 36.2 cm 38.5 cm Ankle Left: Right: Point of Measurement: From Medial Instep 23.5 cm 23.5 cm Vascular Assessment Pulses: Dorsalis Pedis Palpable: [Left:Yes] [Right:Yes] Electronic Signature(s) Signed: 10/11/2021 4:38:27 PM By: Lorrin Jackson Entered By: Lorrin Jackson on 10/11/2021 07:56:05 -------------------------------------------------------------------------------- Multi Wound Chart Details Patient Name: Date of Service: Wyatt Meyer D. 10/11/2021 7:30 A M Medical Record Number: [id number] Patient Account Number: [id number] Date of Birth/Sex: Treating RN: Oct 31, 1960 (61 y.o. [name] Primary Care Sanah Kraska: Dimas Chyle Other Clinician: Referring Joan Avetisyan: Treating Mack Alvidrez/Extender: Donalee Citrin in Treatment: 1 Vital Signs Height(in): Pulse(bpm):  90 Weight(lbs): Blood Pressure(mmHg): 137/80 Body Mass Index(BMI): Temperature(F): 98.6 Respiratory Rate(breaths/min): 18 Photos: [N/A:N/A] Left, Plantar Foot Right, Plantar T Great oe N/A Wound Location: Gradually Appeared Gradually Appeared N/A Wounding Event: Diabetic Wound/Ulcer of the Lower Diabetic Wound/Ulcer of the Lower N/A Primary Etiology: Extremity Extremity Medication Related Medication Related N/A Secondary Etiology: Anemia, Type II Diabetes, Neuropathy, Anemia, Type II Diabetes, Neuropathy, N/A Comorbid History: Received Chemotherapy Received Chemotherapy 09/24/2021 09/24/2021 N/A Date Acquired: 1 1 N/A Weeks of  Treatment: Open Open N/A Wound Status: 0.6x2.8x0.1 0.2x0.4x0.1 N/A Measurements L x W x D (cm) 1.319 0.063 N/A A (cm) : rea 0.132 0.006 N/A Volume (cm) : -133.50% 55.30% N/A % Reduction in A rea: -131.60% 57.10% N/A % Reduction in Volume: Grade 1 Grade 1 N/A Classification: Medium Medium N/A Exudate A mount: Serosanguineous Serosanguineous N/A Exudate Type: red, brown red, brown N/A Exudate Color: Distinct, outline attached Distinct, outline attached N/A Wound Margin: Large (67-100%) Large (67-100%) N/A Granulation A mount: Pink Pink N/A Granulation Quality: None Present (0%) None Present (0%) N/A Necrotic A mount: Fat Layer (Subcutaneous Tissue): Yes Fat Layer (Subcutaneous Tissue): Yes N/A Exposed Structures: Fascia: No Fascia: No Tendon: No Tendon: No Muscle: No Muscle: No Joint: No Joint: No Bone: No Bone: No Small (1-33%) Small (1-33%) N/A Epithelialization: Treatment Notes Wound #1 (Foot) Wound Laterality: Plantar, Left Cleanser Soap and Water Discharge Instruction: May shower and wash wound with dial antibacterial soap and water prior to dressing change. Peri-Wound Care Topical Primary Dressing Hydrofera Blue Ready Foam, 2.5 x2.5 in Discharge Instruction: Apply to wound bed as instructed Secondary  Dressing Woven Gauze Sponge, Non-Sterile 4x4 in Discharge Instruction: Apply over primary dressing as directed. Secured With Conforming Stretch Gauze Bandage, Sterile 2x75 (in/in) Discharge Instruction: Secure with stretch gauze as directed. Paper Tape, 2x10 (in/yd) Discharge Instruction: Secure dressing with tape as directed. Compression Wrap Compression Stockings Add-Ons Wound #2 (Toe Great) Wound Laterality: Plantar, Right Cleanser Soap and Water Discharge Instruction: May shower and wash wound with dial antibacterial soap and water prior to dressing change. Peri-Wound Care Topical Primary Dressing Hydrofera Blue Ready Foam, 2.5 x2.5 in Discharge Instruction: Apply to wound bed as instructed Secondary Dressing Woven Gauze Sponge, Non-Sterile 4x4 in Discharge Instruction: Apply over primary dressing as directed. Secured With Conforming Stretch Gauze Bandage, Sterile 2x75 (in/in) Discharge Instruction: Secure with stretch gauze as directed. Paper Tape, 2x10 (in/yd) Discharge Instruction: Secure dressing with tape as directed. Compression Wrap Compression Stockings Add-Ons Electronic Signature(s) Signed: 10/11/2021 3:43:46 PM By: [name] MD Signed: 10/11/2021 4:46:25 PM By: Baruch Gouty RN, BSN Entered By: [name] on 10/11/2021 08:15:11 -------------------------------------------------------------------------------- Multi-Disciplinary Care Plan Details Patient Name: Date of Service: Wyatt Meyer D. 10/11/2021 7:30 A M Medical Record Number: [id number] Patient Account Number: [id number] Date of Birth/Sex: Treating RN: 05/06/1961 (61 y.o. Wyatt Meyer Primary Care Traeton Bordas: Dimas Chyle Other Clinician: Referring Coy Vandoren: Treating Helmuth Recupero/Extender: Donalee Citrin in Treatment: 1 Multidisciplinary Care Plan reviewed with physician Active Inactive Abuse / Safety / Falls / Self Care Management Nursing Diagnoses: History  of Falls Potential for falls Goals: Patient/caregiver will verbalize/demonstrate measures taken to prevent injury and/or falls Date Initiated: 10/04/2021 Target Resolution Date: 11/01/2021 Goal Status: Active Interventions: Assess fall risk on admission and as needed Notes: Nutrition Nursing Diagnoses: Impaired glucose control: actual or potential Potential for alteratiion in Nutrition/Potential for imbalanced nutrition Goals: Patient/caregiver will maintain therapeutic glucose control Date Initiated: 10/04/2021 Target Resolution Date: 11/01/2021 Goal Status: Active Interventions: Assess HgA1c results as ordered upon admission and as needed Assess patient nutrition upon admission and as needed per policy Provide education on elevated blood sugars and impact on wound healing Treatment Activities: Patient referred to Primary Care Physician for further nutritional evaluation : 10/04/2021 Notes: Wound/Skin Impairment Nursing Diagnoses: Impaired tissue integrity Knowledge deficit related to ulceration/compromised skin integrity Goals: Patient/caregiver will verbalize understanding of skin care regimen Date Initiated: 10/04/2021 Target Resolution Date: 11/01/2021 Goal Status: Active Ulcer/skin breakdown will have a volume reduction  of 30% by week 4 Date Initiated: 10/04/2021 Target Resolution Date: 11/01/2021 Goal Status: Active Interventions: Assess patient/caregiver ability to obtain necessary supplies Assess patient/caregiver ability to perform ulcer/skin care regimen upon admission and as needed Assess ulceration(s) every visit Provide education on ulcer and skin care Treatment Activities: Skin care regimen initiated : 10/04/2021 Topical wound management initiated : 10/04/2021 Notes: Electronic Signature(s) Signed: 10/11/2021 4:38:27 PM By: Lorrin Jackson Entered By: Lorrin Jackson on 10/11/2021  07:54:03 -------------------------------------------------------------------------------- Pain Assessment Details Patient Name: Date of Service: Wyatt Meyer D. 10/11/2021 7:30 A M Medical Record Number: [id number] Patient Account Number: [id number] Date of Birth/Sex: Treating RN: 1961/04/23 (61 y.o. [name] Primary Care Azalyn Sliwa: Dimas Chyle Other Clinician: Referring Yazan Gatling: Treating Nadene Witherspoon/Extender: Donalee Citrin in Treatment: 1 Active Problems Location of Pain Severity and Description of Pain Patient Has Paino No Site Locations Pain Management and Medication Current Pain Management: Electronic Signature(s) Signed: 10/11/2021 4:19:33 PM By: Sandre Kitty Signed: 10/11/2021 4:46:25 PM By: Baruch Gouty RN, BSN Entered By: Sandre Kitty on 10/11/2021 07:49:12 -------------------------------------------------------------------------------- Patient/Caregiver Education Details Patient Name: Date of Service: Wyatt Meyer D. 1/18/2023andnbsp7:30 [place] Record Number: [id number] Patient Account Number: [id number] Date of Birth/Gender: Treating RN: 06/23/1961 (61 y.o. Wyatt Meyer Primary Care Physician: Dimas Chyle Other Clinician: Referring Physician: Treating Physician/Extender: Donalee Citrin in Treatment: 1 Education Assessment Education Provided To: Patient Education Topics Provided Elevated Blood Sugar/ Impact on Healing: Methods: Explain/Verbal Responses: State content correctly Wound/Skin Impairment: Methods: Explain/Verbal, Printed Responses: State content correctly Electronic Signature(s) Signed: 10/11/2021 4:38:27 PM By: Lorrin Jackson Entered By: Lorrin Jackson on 10/11/2021 07:54:25 -------------------------------------------------------------------------------- Wound Assessment Details Patient Name: Date of Service: Wyatt Meyer D. 10/11/2021 7:30 A M Medical  Record Number: [id number] Patient Account Number: [id number] Date of Birth/Sex: Treating RN: [birth year] (61 y.o. Wyatt Meyer Primary Care Orlandis Sanden: Dimas Chyle Other Clinician: Referring Joan Herschberger: Treating Keagan Brislin/Extender: Donalee Citrin in Treatment: 1 Wound Status Wound Number: 1 Primary Etiology: Diabetic Wound/Ulcer of the Lower Extremity Wound Location: Left, Plantar Foot Secondary Medication Related Etiology: Wounding Event: Gradually Appeared Wound Status: Open Date Acquired: 09/24/2021 Comorbid History: Anemia, Type II Diabetes, Neuropathy, Received Weeks Of Treatment: 1 Chemotherapy Clustered Wound: No Photos Wound Measurements Length: (cm) 0.6 Width: (cm) 2.8 Depth: (cm) 0.1 Area: (cm) 1.319 Volume: (cm) 0.132 % Reduction in Area: -133.5% % Reduction in Volume: -131.6% Epithelialization: Small (1-33%) Tunneling: No Undermining: No Wound Description Classification: Grade 1 Wound Margin: Distinct, outline attached Exudate Amount: Medium Exudate Type: Serosanguineous Exudate Color: red, brown Foul Odor After Cleansing: No Slough/Fibrino No Wound Bed Granulation Amount: Large (67-100%) Exposed Structure Granulation Quality: Pink Fascia Exposed: No Necrotic Amount: None Present (0%) Fat Layer (Subcutaneous Tissue) Exposed: Yes Tendon Exposed: No Muscle Exposed: No Joint Exposed: No Bone Exposed: No Treatment Notes Wound #1 (Foot) Wound Laterality: Plantar, Left Cleanser Soap and Water Discharge Instruction: May shower and wash wound with dial antibacterial soap and water prior to dressing change. Peri-Wound Care Topical Primary Dressing Hydrofera Blue Ready Foam, 2.5 x2.5 in Discharge Instruction: Apply to wound bed as instructed Secondary Dressing Woven Gauze Sponge, Non-Sterile 4x4 in Discharge Instruction: Apply over primary dressing as directed. Secured With Conforming Stretch Gauze Bandage, Sterile 2x75  (in/in) Discharge Instruction: Secure with stretch gauze as directed. Paper Tape, 2x10 (in/yd) Discharge Instruction: Secure dressing with tape as directed. Compression Wrap Compression Stockings Add-Ons Electronic Signature(s) Signed: 10/11/2021 4:19:33 PM By: Sandre Kitty Signed: 10/11/2021  4:38:27 PM By: Lorrin Jackson Entered By: Sandre Kitty on 10/11/2021 07:54:02 -------------------------------------------------------------------------------- Wound Assessment Details Patient Name: Date of Service: Wyatt Meyer D. 10/11/2021 7:30 A M Medical Record Number: [id number] Patient Account Number: [id number] Date of Birth/Sex: Treating RN: April 13, 1961 (61 y.o. Wyatt Meyer Primary Care Reynol Arnone: Dimas Chyle Other Clinician: Referring Eisa Necaise: Treating Zamirah Denny/Extender: Donalee Citrin in Treatment: 1 Wound Status Wound Number: 2 Primary Etiology: Diabetic Wound/Ulcer of the Lower Extremity Wound Location: Right, Plantar T Great oe Secondary Medication Related Etiology: Wounding Event: Gradually Appeared Wound Status: Open Date Acquired: 09/24/2021 Comorbid History: Anemia, Type II Diabetes, Neuropathy, Received Weeks Of Treatment: 1 Chemotherapy Clustered Wound: No Photos Wound Measurements Length: (cm) 0.2 Width: (cm) 0.4 Depth: (cm) 0.1 Area: (cm) 0.063 Volume: (cm) 0.006 % Reduction in Area: 55.3% % Reduction in Volume: 57.1% Epithelialization: Small (1-33%) Tunneling: No Undermining: No Wound Description Classification: Grade 1 Wound Margin: Distinct, outline attached Exudate Amount: Medium Exudate Type: Serosanguineous Exudate Color: red, brown Foul Odor After Cleansing: No Slough/Fibrino No Wound Bed Granulation Amount: Large (67-100%) Exposed Structure Granulation Quality: Pink Fascia Exposed: No Necrotic Amount: None Present (0%) Fat Layer (Subcutaneous Tissue) Exposed: Yes Tendon Exposed: No Muscle Exposed:  No Joint Exposed: No Bone Exposed: No Treatment Notes Wound #2 (Toe Great) Wound Laterality: Plantar, Right Cleanser Soap and Water Discharge Instruction: May shower and wash wound with dial antibacterial soap and water prior to dressing change. Peri-Wound Care Topical Primary Dressing Hydrofera Blue Ready Foam, 2.5 x2.5 in Discharge Instruction: Apply to wound bed as instructed Secondary Dressing Woven Gauze Sponge, Non-Sterile 4x4 in Discharge Instruction: Apply over primary dressing as directed. Secured With Conforming Stretch Gauze Bandage, Sterile 2x75 (in/in) Discharge Instruction: Secure with stretch gauze as directed. Paper Tape, 2x10 (in/yd) Discharge Instruction: Secure dressing with tape as directed. Compression Wrap Compression Stockings Add-Ons Electronic Signature(s) Signed: 10/11/2021 4:38:27 PM By: Lorrin Jackson Entered By: Lorrin Jackson on 10/11/2021 08:06:16 -------------------------------------------------------------------------------- Vitals Details Patient Name: Date of Service: Wyatt Meyer D. 10/11/2021 7:30 A M Medical Record Number: [id number] Patient Account Number: [id number] Date of Birth/Sex: Treating RN: [birth year] (61 y.o. [name] Primary Care Shifa Brisbon: Dimas Chyle Other Clinician: Referring Miciah Shealy: Treating Tannar Broker/Extender: Donalee Citrin in Treatment: 1 Vital Signs Time Taken: 07:48 Temperature (F): 98.6 Pulse (bpm): 90 Respiratory Rate (breaths/min): 18 Blood Pressure (mmHg): 137/80 Reference Range: 80 - 120 mg / dl Electronic Signature(s) Signed: 10/11/2021 4:19:33 PM By: Sandre Kitty Entered By: Sandre Kitty on 10/11/2021 07:49:04

## 2021-10-11 NOTE — Progress Notes (Signed)
Wyatt Meyer ([id number]) Visit Report for 10/11/2021 HPI Details Patient Name: Date of Service: Wyatt Meyer, Wyatt Meyer. 10/11/2021 7:30 A M Medical Record Number: [id number] Patient Account Number: [id number] Date of Birth/Sex: Treating RN: 10-15-1960 (61 y.o. [name] Primary Care Provider: Dimas Chyle Other Clinician: Referring Provider: Treating Provider/Extender: Donalee Citrin in Treatment: 1 History of Present Illness HPI Description: 10/04/2021 on evaluation today patient presents for initial inspection here in the clinic concerning issues that he has been having with the plantar aspect of his feet bilaterally. Subsequently this seems to be in relation to the chemotherapy that he has been undergoing, Xeloda. Subsequently the patient had what appears to be as best I can tell 5 cycles I believe and then was placed on hold on 09/28/2021 due to the issues with the skin on the bottom of his feet. He has a history of prostate cancer status post prostatectomy. He also has peripheral neuropathy, diabetes mellitus type 2, iron deficiency anemia secondary to the colon cancer and bleeding that was associated. 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 Upon further review it actually appears he began cycle 6 of the Xeloda on 09/27/2021. And therefore was placed on hold. He has been using Neosporin to the ulcerated areas. He is supposed to see Dr. Malachy Mood back next week in the interim he was seeing [place] for further evaluation of these areas to see if there is anything we can do to speed up the healing. The patient did have in August 2022 a: Resection with hemicolectomy. 1/18; This patient is a apparently had hand-foot syndrome secondary to Xeloda. This livedo has actually been on for 2 weeks. He had dry scaly skin on his hands that is improving. At 1 point he had an area on the right lateral foot this  is healed. He has a small remaining area on the plantar aspect of the left first toe this is just about closed. There is extension of the left midfoot just below the heads he has an open area but even this looks like it is epithelializing medially. We ordered Xeroform for him last week. For some reason this did not come through and he simply been using topical antibiotics. 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 10/11/2021 7:30 A M Medical Record Number: [id number] Patient Account Number: [id number] Date of Birth/Sex: Treating RN: [birth year] (61 y.o. [name] Primary Care Provider: Dimas Chyle Other Clinician: Referring Provider: Treating Provider/Extender: Donalee Citrin in Treatment: 1 Constitutional Sitting or standing Blood Pressure is within target range for patient.. Pulse regular and within target range for patient.[name] Respirations regular, non-labored and within target range.. Temperature is normal and within the target range for the patient.[name] Appears in no distress. Cardiovascular Pedal pulses absent bilaterally.. Notes Wound exam; the area on the right first toe proximal phalanx is almost closed there is very little wound area here. He has an already healed area on the right lateral midfoot. The most substantial areas on the left foot just below the met heads. Medially this appears to be epithelializing although the overall measurements today were not that change. Debridement of the wound in terms of surface looks quite healthy.  No debridement was required Electronic Signature(s) Signed: 10/11/2021 3:43:46 PM By: [name] MD Entered By: [name] on  10/11/2021 08:20:27 -------------------------------------------------------------------------------- Physician Orders Details Patient Name: Date of Service: Wyatt Meyer. 10/11/2021 7:30 A M Medical Record Number: [id number] Patient Account Number: [id number] Date of Birth/Sex: Treating RN: 1961/01/12 (62 y.o. 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 - with Margarita Grizzle Return A Bathing/ Shower/ Hygiene May shower and wash wound with soap and water. Off-Loading Wedge shoe to: - Front off-loading shoe to left foot Additional Orders / Instructions Follow Nutritious Diet - Monitor/Control Blood Sugar Wound Treatment Wound #1 - Foot Wound Laterality: Plantar, Left Cleanser: Soap and Water 1 x Per Day/30 Days Discharge Instructions: May shower and wash wound with dial antibacterial soap and water prior to dressing change. Prim Dressing: Hydrofera Blue Ready Foam, 2.5 x2.5 in 1 x Per Day/30 Days ary Discharge Instructions: Apply to wound bed as instructed Secondary Dressing: Woven Gauze Sponge, Non-Sterile 4x4 in (Generic) 1 x Per Day/30 Days Discharge Instructions: Apply over primary dressing as directed. Secured With: Child psychotherapist, Sterile 2x75 (in/in) (Generic) 1 x Per Day/30 Days Discharge Instructions: Secure with  stretch gauze as directed. Secured With: Paper Tape, 2x10 (in/yd) (Generic) 1 x Per Day/30 Days Discharge Instructions: Secure dressing with tape as directed. Wound #2 - T Great oe Wound Laterality: Plantar, Right Cleanser: Soap and Water 1 x Per Day/30 Days Discharge Instructions: May shower and wash wound with dial antibacterial soap and water prior to dressing change. Prim Dressing: Hydrofera Blue Ready Foam, 2.5 x2.5 in 1 x Per Day/30 Days ary Discharge Instructions: Apply to wound bed as instructed Secondary Dressing: Woven Gauze Sponge, Non-Sterile 4x4 in (Generic) 1 x Per Day/30 Days Discharge Instructions: Apply over primary dressing as directed. Secured With: Child psychotherapist, Sterile 2x75 (in/in) (Generic) 1 x Per Day/30 Days Discharge Instructions: Secure with stretch gauze as directed. Secured With: Paper Tape, 2x10 (in/yd) (Generic) 1 x Per Day/30 Days Discharge Instructions: Secure dressing with tape as directed. Electronic Signature(s) Signed: 10/11/2021 3:43:46 PM By: [name] MD Signed: 10/11/2021 4:38:27 PM By: Lorrin Jackson Entered By: Lorrin Jackson on 10/11/2021 08:11:37 -------------------------------------------------------------------------------- Problem List Details Patient Name: Date of Service: Wyatt Meyer. 10/11/2021 7:30 A M Medical Record Number: [id number] Patient Account Number: [id number] Date of Birth/Sex: Treating RN: Feb 11, 1961 (61 y.o. [name] Primary Care Provider: Dimas Chyle Other Clinician: Referring Provider: Treating Provider/Extender: Donalee Citrin in Treatment: 1 Active Problems ICD-10 Encounter Code Description Active Date MDM Diagnosis E11.621 Type 2 diabetes mellitus with foot ulcer 10/04/2021 No Yes L97.522 Non-pressure chronic ulcer of other part of left foot with fat layer exposed 10/04/2021 No Yes L97.512 Non-pressure chronic ulcer of other part of right foot with  fat layer exposed 10/04/2021 No Yes C18.8 Malignant neoplasm of overlapping sites of colon 10/04/2021 No Yes Z90.49 Acquired absence of other specified parts  of digestive tract 10/04/2021 No Yes 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 10/11/2021 7:30 A M Medical Record Number: [id number] Patient Account Number: [id number] Date of Birth/Sex: Treating RN: 10/20/1960 (61 y.o. [name] Primary Care Provider: Dimas Chyle Other Clinician: Referring Provider: Treating Provider/Extender: Donalee Citrin in Treatment: 1 Subjective History of Present Illness (HPI) 10/04/2021 on evaluation today patient presents for initial inspection here in the clinic concerning issues that he has been having with the plantar aspect of his feet bilaterally. Subsequently this seems to be in relation to the chemotherapy that he has been undergoing, Xeloda. Subsequently the patient had what appears to be as best I can tell 5 cycles I believe and then was placed on hold on 09/28/2021 due to the issues with the skin on the bottom of his feet. He has a history of prostate cancer status post prostatectomy. He also has peripheral neuropathy, diabetes mellitus type 2, iron deficiency anemia secondary to the colon cancer and bleeding that was associated. 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 Upon further review it actually appears he began cycle 6 of the Xeloda on 09/27/2021. And therefore was placed on hold. He has been using Neosporin to the ulcerated  areas. He is supposed to see Dr. Malachy Mood back next week in the interim he was seeing [place] for further evaluation of these areas to see if there is anything we can do to speed up the healing. The patient did have in August 2022 a: Resection with hemicolectomy. 1/18; This patient is a apparently had hand-foot syndrome secondary to Xeloda. This livedo has actually been on for 2 weeks. He had dry scaly skin on his hands that is improving. At 1 point he had an area on the right lateral foot this is healed. He has a small remaining area on the plantar aspect of the left first toe this is just about closed. There is extension of the left midfoot just below the heads he has an open area but even this looks like it is epithelializing medially. We ordered Xeroform for him last week. For some reason this did not come through and he simply been using topical antibiotics. 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 Pulse regular and within target range for patient.[name] Respirations regular, non-labored and within target range.. Temperature is normal and within the target range for the patient.[name] Appears in no distress. Vitals Time Taken: 7:48 AM, Temperature: 98.6 F, Pulse: 90 bpm, Respiratory Rate: 18 breaths/min, Blood Pressure: 137/80 mmHg. Cardiovascular Pedal pulses absent bilaterally.. General Notes: Wound exam; the area on the right first toe proximal phalanx is almost closed there is very little wound area here. He has an already healed area on the right lateral midfoot. oo The most substantial areas on the left foot just below the met heads. Medially this appears to be epithelializing although the overall measurements today were not that change. Debridement of the wound in terms of surface looks quite healthy. No debridement was required Integumentary (Hair, Skin) Wound #1 status is Open. Original cause of  wound  was Gradually Appeared. The date acquired was: 09/24/2021. The wound has been in treatment 1 weeks. The wound is located on the [place]. The wound measures 0.6cm length x 2.8cm width x 0.1cm depth; 1.319cm^2 area and 0.132cm^3 volume. There is Fat Layer (Subcutaneous Tissue) exposed. There is no tunneling or undermining noted. There is a medium amount of serosanguineous drainage noted. The wound margin is distinct with the outline attached to the wound base. There is large (67-100%) pink granulation within the wound bed. There is no necrotic tissue within the wound bed. Wound #2 status is Open. Original cause of wound was Gradually Appeared. The date acquired was: 09/24/2021. The wound has been in treatment 1 weeks. The wound is located on the [employer]. The wound measures 0.2cm length x 0.4cm width x 0.1cm depth; 0.063cm^2 area and 0.006cm^3 volume. oe There is Fat Layer (Subcutaneous Tissue) exposed. There is no tunneling or undermining noted. There is a medium amount of serosanguineous drainage noted. The wound margin is distinct with the outline attached to the wound base. There is large (67-100%) pink granulation within the wound bed. There is no necrotic tissue within the wound bed. 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 he had hand-foot syndrome secondary to chemotherapy as well which has been ongoing for 2 weeks #2 as I understand things that this is correct this should resolve in 2 to 4 weeks. #2 He still has type 2 diabetes with neuropathy. 3 change his dressing to Hydrofera Blue. Not really sure why this did not go  through last week Xeroform 4 discussed.offloading the left foot Plan Follow-up Appointments: Return Appointment in 1 week. - with Glynn Octave Shower/ Hygiene: May shower and wash wound with soap and water. Off-Loading: Wedge shoe to: - Front off-loading shoe to left foot Additional Orders / Instructions: Follow Nutritious Diet - Monitor/Control Blood Sugar WOUND #1: - Foot Wound Laterality: Plantar, Left Cleanser: Soap and Water 1 x Per Day/30 Days Discharge Instructions: May shower and wash wound with dial antibacterial soap and water prior to dressing change. Prim Dressing: Hydrofera Blue Ready Foam, 2.5 x2.5 in 1 x Per Day/30 Days ary Discharge Instructions: Apply to wound bed as instructed Secondary Dressing: Woven Gauze Sponge, Non-Sterile 4x4 in (Generic) 1 x Per Day/30 Days Discharge Instructions: Apply over primary dressing as directed. Secured With: Child psychotherapist, Sterile 2x75 (in/in) (Generic) 1 x Per Day/30 Days Discharge Instructions: Secure with stretch gauze as directed. Secured With: Paper T ape, 2x10 (in/yd) (Generic) 1 x Per Day/30 Days Discharge Instructions: Secure dressing with tape as directed. WOUND #2: - T Great Wound Laterality: Plantar, Right oe Cleanser: Soap and Water 1 x Per Day/30 Days Discharge Instructions: May shower and wash wound with dial antibacterial soap and water prior to dressing change. Prim Dressing: Hydrofera Blue Ready Foam, 2.5 x2.5 in 1 x Per Day/30 Days ary Discharge Instructions: Apply to wound bed as instructed Secondary Dressing: Woven Gauze Sponge, Non-Sterile 4x4 in (Generic) 1 x Per Day/30 Days Discharge Instructions: Apply over primary dressing as directed. Secured With: Child psychotherapist, Sterile 2x75 (in/in) (Generic) 1 x Per Day/30 Days Discharge Instructions: Secure with stretch  gauze as directed. Secured With: Paper T ape, 2x10 (in/yd) (Generic) 1 x Per Day/30 Days Discharge Instructions:  Secure dressing with tape as directed. Electronic Signature(s) Signed: 10/11/2021 3:43:46 PM By: [name] MD Entered By: [name] on 10/11/2021 08:23:49 -------------------------------------------------------------------------------- SuperBill Details Patient Name: Date of Service: Wyatt Meyer. 10/11/2021 Medical Record Number: [id number] Patient Account Number: [id number] Date of Birth/Sex: Treating RN: [birth year] (61 y.o. 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 L84 Corns and callosities Facility Procedures CPT4 Code: [id number] Description: 99213 - WOUND CARE VISIT-LEV 3 EST PT Modifier: Quantity: 1 Physician Procedures : CPT4 Code Description Modifier [id number] - WC PHYS LEVEL 3 - EST PT ICD-10 Diagnosis Description E11.621 Type 2 diabetes mellitus with foot ulcer L97.522 Non-pressure chronic ulcer of other part of left foot with fat layer exposed L97.512  Non-pressure chronic ulcer of other part of right foot with fat layer exposed Quantity: 1 Electronic Signature(s) Signed: 10/11/2021 3:43:46 PM By: [name] MD Entered By: [name] on 10/11/2021 08:24:05

## 2021-10-16 ENCOUNTER — Other Ambulatory Visit (HOSPITAL_COMMUNITY): Payer: Self-pay

## 2021-10-16 ENCOUNTER — Inpatient Hospital Stay: Payer: BC Managed Care – PPO | Admitting: Nurse Practitioner

## 2021-10-16 ENCOUNTER — Other Ambulatory Visit: Payer: Self-pay

## 2021-10-16 ENCOUNTER — Encounter: Payer: Self-pay | Admitting: Nurse Practitioner

## 2021-10-16 VITALS — BP 152/76 | HR 62 | Temp 97.8°F | Resp 18 | Ht 73.0 in | Wt 244.6 lb

## 2021-10-16 DIAGNOSIS — C182 Malignant neoplasm of ascending colon: Secondary | ICD-10-CM

## 2021-10-16 DIAGNOSIS — C180 Malignant neoplasm of cecum: Secondary | ICD-10-CM | POA: Diagnosis not present

## 2021-10-16 NOTE — Progress Notes (Signed)
[facility] OFFICE PROGRESS NOTE   Diagnosis: Colon cancer  INTERVAL HISTORY:   Wyatt Meyer returns as scheduled.  He has completed 5 cycles of Xeloda.  Xeloda has been on hold since 09/28/2021 due to hand-foot syndrome.  Hands and feet continue to be improved.  No nausea or vomiting.  Intermittent frequent stools.  Objective:  Vital signs in last 24 hours:  Blood pressure (!) 152/76, pulse 62, temperature 97.8 F (36.6 C), temperature source Oral, resp. rate 18, height _0  (1.854 m), weight 244 lb 9.6 oz (110.9 kg), SpO2 100 %.    HEENT: No thrush or ulcers. Resp: Lungs clear bilaterally. Cardio: Regular rate and rhythm. GI: Abdomen soft and nontender.  No hepatomegaly. Vascular: No leg edema. Skin: Palms with hyperpigmentation.  No skin breakdown.  Previously noted areas of ulceration on the right sole have healed.  Persistent small opening sole of the left foot.  Soles with hyperpigmentation.   Lab Results:  Lab Results  Component Value Date   WBC 9.4 09/28/2021   HGB 10.5 (L) 09/28/2021   HCT 33.6 (L) 09/28/2021   MCV 101.8 (H) 09/28/2021   PLT 203 09/28/2021   NEUTROABS 5.9 09/28/2021    Imaging:  No results found.  Medications: I have reviewed the patient's current medications.  Assessment/Plan: Colon cancer, cecum, stage IIb (T4a N0 M0), status post a laparoscopic right colectomy 05/03/2021 0/19 lymph nodes, no perineural or lymphovascular invasion, negative resection margins, no tumor deposits, tumor extends into subserosal connective tissue with microscopic involvement of the serosa, MSS, no loss of mismatch repair protein expression CT the abdomen/pelvis 02/03/2021-no bowel obstruction, no adenopathy, probable fatty liver CT chest 02/14/2021-2 mm right middle lobe and 6 mm left upper lobe fissural nodule, nonspecific Elevated preoperative CEA, repeat CEA 06/05/2021-4.3 Guardant Reveal 06/05/2021-negative Cycle 1 adjuvant Xeloda 06/05/2021 Cycle 2  Xeloda 06/26/2021--reported he was still taking Xeloda 07/14/2021, instructed to stop further Xeloda with the plan to resume with cycle 3 on 07/24/2021 Cycle 3 Xeloda 07/24/2021 Cycle 4 Xeloda 08/14/2021-reported he was still taking Xeloda 08/31/2021, instructed to stop further Xeloda with the plan to resume with cycle 5 on 09/08/2021 Guardant reveal 08/31/2021-negative Cycle 5 Xeloda 09/08/2021 Xeloda placed on hold due to hand-foot syndrome 09/28/2021 Cycle 6 Xeloda resumed 10/16/2021, dose reduced to 1000 mg twice daily for 14 days/7 day break   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  He has completed 5 cycles of Xeloda.  Xeloda has been on hold since 09/28/2021 due to hand-foot syndrome.  He continues to have a small area of ulceration at the left sole, considerably improved over the past week.  He will begin cycle 6 Xeloda at a 50% dose reduction today.  He understands to discontinue Xeloda and contact the office if hand/foot symptoms recur.  He will return for lab and follow-up on 11/02/2021.  He will contact the office in the interim as outlined above or with any other problems.  Patient seen with Dr. Benay Spice.    Ned Card ANP/GNP-BC   10/16/2021  9:14 AM  This was a shared visit with Ned Card.  Wyatt Meyer was interviewed and examined.  The hand/foot syndrome appears significantly improved.  There is 1 remaining small ulceration at the plantar surface of the left foot.  He will resume Xeloda at a reduced  dose.  I was present for greater than 50% of today's visit.  I performed medical decision making.  [name], MD

## 2021-10-17 ENCOUNTER — Encounter: Payer: Self-pay | Admitting: Family Medicine

## 2021-10-17 ENCOUNTER — Ambulatory Visit: Payer: BC Managed Care – PPO | Admitting: Family Medicine

## 2021-10-17 VITALS — BP 122/62 | HR 88 | Temp 98.2°F | Wt 244.9 lb

## 2021-10-17 DIAGNOSIS — R300 Dysuria: Secondary | ICD-10-CM | POA: Diagnosis not present

## 2021-10-17 DIAGNOSIS — E1165 Type 2 diabetes mellitus with hyperglycemia: Secondary | ICD-10-CM | POA: Diagnosis not present

## 2021-10-17 DIAGNOSIS — N390 Urinary tract infection, site not specified: Secondary | ICD-10-CM

## 2021-10-17 DIAGNOSIS — C189 Malignant neoplasm of colon, unspecified: Secondary | ICD-10-CM

## 2021-10-17 LAB — POCT URINALYSIS DIPSTICK
Bilirubin, UA: NEGATIVE
Blood, UA: POSITIVE
Glucose, UA: POSITIVE — AB
Ketones, UA: NEGATIVE
Leukocytes, UA: NEGATIVE
Nitrite, UA: NEGATIVE
Protein, UA: POSITIVE — AB
Spec Grav, UA: 1.025 (ref 1.010–1.025)
Urobilinogen, UA: 0.2 E.U./dL
pH, UA: 5 (ref 5.0–8.0)

## 2021-10-17 MED ORDER — NITROFURANTOIN MONOHYD MACRO 100 MG PO CAPS: 100.0000 mg | ORAL_CAPSULE | Freq: Two times a day (BID) | ORAL | 0 refills | Status: DC

## 2021-10-17 NOTE — Assessment & Plan Note (Signed)
Continue management per oncology. 

## 2021-10-17 NOTE — Progress Notes (Signed)
° °  Wyatt Meyer is a 61 y.o. male who presents today for an office visit.  Assessment/Plan:  New/Acute Problems: UTI Urine dipstick positive.  We will empirically start Macrobid 100 mg twice daily for 7 days.  Check urine culture.  No signs of systemic infection.  Encouraged hydration.  Discussed reasons to return to care.  Chronic Problems Addressed Today: Colon cancer ([place]) Continue management per oncology.  Type 2 diabetes mellitus with hyperglycemia (HCC) His reported home sugars have been in the low 100s.  He has not yet followed up with endocrinologist.  We discussed this at his last office visit.  We will re-place referral today.     Subjective:  HPI:  Patient here with concern for possible UTI. Symptoms started 1-2 weeks ago. His symptoms included urine odor and appearance. Had urine sample at office today which was positive for UTI. No treatment tried. No cough or congestion.  No precipating events. Denies nausea or vomiting. He denies hematuria or dysuria. No fever ,chill, or nausea.   He has completed 5 cycles of Xeloda for colon cancer. He does admit having side effects. He developed some skin breakdown at the feet and hand. Symptoms have been improving. He is following up with his oncologist for this issue.         Objective:  Physical Exam: BP 122/62 (BP Location: Right Arm, Patient Position: Sitting)    Pulse 88    Temp 98.2 F (36.8 C) (Temporal)    Wt 244 lb 14.4 oz (111.1 kg)    SpO2 96%    BMI 32.31 kg/m   Gen: No acute distress, resting comfortably CV: Regular rate and rhythm with no murmurs appreciated Pulm: Normal work of breathing, clear to auscultation bilaterally with no crackles, wheezes, or rhonchi Neuro: Grossly normal, moves all extremities Psych: Normal affect and thought content       I,[name],acting as a scribe for Dimas Chyle, MD.,have documented all relevant documentation on the behalf of Dimas Chyle, MD,as directed by  Dimas Chyle, MD while in the presence of Dimas Chyle, MD.   I, Dimas Chyle, MD, have reviewed all documentation for this visit. The documentation on 10/17/21 for the exam, diagnosis, procedures, and orders are all accurate and complete.  [name]. Jerline Pain, MD 10/17/2021 1:51 PM

## 2021-10-17 NOTE — Patient Instructions (Addendum)
It was very nice to see you today!  I think your have a UTI.   Please start the macrobid. We we will check a urine culture to see if we have you on the right antibiotic.  I will refer you to see an endocrinologist.  Please make sure that you are getting plenty of fluids.  Let us know if your symptoms are not improving over the next few days.  Take care, Dr Jerline Pain  PLEASE NOTE:  If you had any lab tests please let us know if you have not heard back within a few days. You may see your results on mychart before we have a chance to review them but we will give you a call once they are reviewed by [place]. If we ordered any referrals today, please let us know if you have not heard from their office within the next week.   Please try these tips to maintain a healthy lifestyle:  Eat at least 3 REAL meals and 1-2 snacks per day.  Aim for no more than 5 hours between eating.  If you eat breakfast, please do so within one hour of getting up.   Each meal should contain half fruits/vegetables, one quarter protein, and one quarter carbs (no bigger than a computer mouse)  Cut down on sweet beverages. This includes juice, soda, and sweet tea.   Drink at least 1 glass of water with each meal and aim for at least 8 glasses per day  Exercise at least 150 minutes every week.

## 2021-10-17 NOTE — Telephone Encounter (Signed)
Please call pt and schedule with an available provider for possible UTI.

## 2021-10-17 NOTE — Telephone Encounter (Signed)
Patient has been scheduled

## 2021-10-17 NOTE — Assessment & Plan Note (Signed)
His reported home sugars have been in the low 100s.  He has not yet followed up with endocrinologist.  We discussed this at his last office visit.  We will re-place referral today.

## 2021-10-18 ENCOUNTER — Other Ambulatory Visit: Payer: Self-pay

## 2021-10-18 ENCOUNTER — Other Ambulatory Visit: Payer: Self-pay | Admitting: Oncology

## 2021-10-18 ENCOUNTER — Other Ambulatory Visit (HOSPITAL_COMMUNITY): Payer: Self-pay

## 2021-10-18 ENCOUNTER — Encounter (HOSPITAL_BASED_OUTPATIENT_CLINIC_OR_DEPARTMENT_OTHER): Payer: BC Managed Care – PPO | Admitting: Physician Assistant

## 2021-10-18 DIAGNOSIS — E11621 Type 2 diabetes mellitus with foot ulcer: Secondary | ICD-10-CM | POA: Diagnosis not present

## 2021-10-18 DIAGNOSIS — C182 Malignant neoplasm of ascending colon: Secondary | ICD-10-CM

## 2021-10-18 NOTE — Progress Notes (Addendum)
Wyatt Meyer ([id number]) Visit Report for 10/18/2021 Chief Complaint Document Details Patient Name: Date of Service: [name], [name] 10/18/2021 7:30 A M Medical Record Number: [id number] Patient Account Number: [id number] Date of Birth/Sex: Treating RN: Wyatt Meyer (61 y.o. Wyatt Meyer Primary Care Provider: Dimas Chyle Other Clinician: Referring Provider: Treating Provider/Extender: Lebron Quam in Treatment: 2 Information Obtained from: Patient Chief Complaint Left foot and right 1st toe ulcers Electronic Signature(s) Signed: 10/18/2021 8:13:19 AM By: Worthy Keeler PA-C Entered By: Worthy Keeler on 10/18/2021 08:13:19 -------------------------------------------------------------------------------- HPI Details Patient Name: Date of Service: Wyatt Son D. 10/18/2021 7:30 A M Medical Record Number: [id number] Patient Account Number: [id number] Date of Birth/Sex: Treating RN: Wyatt Meyer (61 y.o. Wyatt Meyer Primary Care Provider: Dimas Chyle Other Clinician: Referring Provider: Treating Provider/Extender: Welford Roche Weeks in Treatment: 2 History of Present Illness HPI Description: 10/04/2021 on evaluation today patient presents for initial inspection here in the clinic concerning issues that he has been having with the plantar aspect of his feet bilaterally. Subsequently this seems to be in relation to the chemotherapy that he has been undergoing, Xeloda. Subsequently the patient had what appears to be as best I can tell 5 cycles I believe and then was placed on hold on 09/28/2021 due to the issues with the skin on the bottom of his feet. He has a history of prostate cancer status post prostatectomy. He also has peripheral neuropathy, diabetes mellitus type 2, iron deficiency anemia secondary to the colon cancer and bleeding that was associated. 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 Upon further review it actually appears he began cycle 6 of the Xeloda on 09/27/2021. And therefore was placed on hold. He has been using Neosporin to the ulcerated areas. He is supposed to see Dr. Malachy Mood back next week in the interim he was seeing [place] for further evaluation of these areas to see if there is anything we can do to speed up the healing. The patient did have in August 2022 a: Resection with hemicolectomy. 1/18; This patient is a apparently had hand-foot syndrome secondary to Xeloda. This livedo has actually been on for 2 weeks. He had dry scaly skin on his hands that is improving. At 1 point he had an area on the right lateral foot this is healed. He has a small remaining area on the plantar aspect of the left first toe this is just about closed. There is extension of the left midfoot just below the heads he has an open area but even this looks like it is epithelializing medially. We ordered Xeroform for him last week. For some reason this did not come through and he simply been using topical antibiotics. 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 He has been tolerating the dressing changes without complication. Fortunately I think that he is actually making excellent progress at this time his right foot is completely healed the left foot though not completely healed appears to be doing much better. No fevers, chills, nausea, vomiting, or diarrhea. Electronic Signature(s) Signed: 10/18/2021 9:04:28 AM By: Worthy Keeler PA-C Entered By: Worthy Keeler on 10/18/2021 09:04:28 -------------------------------------------------------------------------------- Physical Exam Details Patient Name: Date of Service: Wyatt Son D. 10/18/2021 7:30 A M Medical  Record Number: [id number] Patient Account Number: [id number] Date of Birth/Sex: Treating  RN: Wyatt Meyer (61 y.o. Wyatt Meyer Primary Care Provider: Dimas Chyle Other Clinician: Referring Provider: Treating Provider/Extender: Welford Roche Weeks in Treatment: 2 Constitutional Well-nourished and well-hydrated in no acute distress. Respiratory normal breathing without difficulty. Psychiatric this patient is able to make decisions and demonstrates good insight into disease process. Alert and Oriented x 3. pleasant and cooperative. Notes Upon inspection patient's wound bed actually showed signs of good granulation and epithelization at this point. Fortunately there does not appear to be any evidence of active infection locally nor systemically at this time. No fevers, chills, nausea, vomiting, or diarrhea. He did start back on his chemotherapy treatment yesterday but it is at half the dose hopefully is not can to cause any issues here. Electronic Signature(s) Signed: 10/18/2021 9:04:55 AM By: Worthy Keeler PA-C Entered By: Worthy Keeler on 10/18/2021 09:04:55 -------------------------------------------------------------------------------- Physician Orders Details Patient Name: Date of Service: Wyatt Son D. 10/18/2021 7:30 A M Medical Record Number: [id number] Patient Account Number: [id number] Date of Birth/Sex: Treating RN: Wyatt Meyer (61 y.o. 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 - with Margarita Grizzle Return A Other: - Urea Cream 40% any thick harden callous areas can apply daily at night. Bathing/ Shower/ Hygiene May shower and wash wound with soap and water. Off-Loading Wedge shoe to: - Front off-loading shoe to left foot Additional Orders / Instructions Follow Nutritious Diet - Monitor/Control Blood Sugar Wound Treatment Wound #1 - Foot Wound Laterality: Plantar, Left Cleanser: Soap and Water 1 x Per Day/30 Days Discharge Instructions: May shower and wash wound with dial antibacterial soap and water prior to dressing change. Prim Dressing: Hydrofera Blue Ready Foam, 2.5 x2.5 in 1 x Per Day/30 Days ary Discharge Instructions: Apply to wound bed as instructed Secondary Dressing: Woven Gauze Sponge, Non-Sterile 4x4 in (Generic) 1 x Per Day/30 Days Discharge Instructions: Apply over primary dressing as directed. Secured With: Child psychotherapist, Sterile 2x75 (in/in) (Generic) 1 x Per Day/30 Days Discharge Instructions: Secure with stretch gauze as directed. Secured With: Paper Tape, 2x10 (in/yd) (Generic) 1 x Per Day/30 Days Discharge Instructions: Secure dressing with tape as directed. Electronic Signature(s) Signed: 10/18/2021 3:45:35 PM By: Worthy Keeler PA-C Signed: 10/18/2021 4:52:26 PM By: Deon Pilling RN, BSN Entered By: Deon Pilling on 10/18/2021 08:22:43 -------------------------------------------------------------------------------- Problem List Details Patient Name: Date of Service: Wyatt Son D. 10/18/2021 7:30 A M Medical Record Number: [id number] Patient Account Number: [id number] Date of Birth/Sex: Treating RN: [birth year] (61 y.o. 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 10/18/2021 7:30 A M Medical Record Number: [id number] Patient Account Number: [id number] Date of Birth/Sex: Treating RN: [birth year] (61 y.o. Wyatt Meyer Primary Care Provider: Dimas Chyle Other Clinician: Referring Provider: Treating Provider/Extender: Lebron Quam in Treatment: 2 Subjective Chief Complaint Information obtained from Patient Left foot and right 1st toe ulcers History of Present Illness (HPI) 10/04/2021 on evaluation today patient presents for initial inspection here in the clinic concerning issues that he has been having with the plantar aspect of his feet bilaterally. Subsequently this seems to be in relation to the chemotherapy that he has been undergoing, Xeloda. Subsequently the patient had what appears to be as best I can tell 5 cycles I believe and then was placed on hold on 09/28/2021 due to the issues with the skin on the bottom of his feet. He has a history of prostate cancer status post prostatectomy. He also has peripheral  neuropathy, diabetes mellitus type 2, iron deficiency anemia secondary to the colon cancer and bleeding that was associated. 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 Upon further review it actually appears he began cycle 6 of the Xeloda on 09/27/2021. And therefore was placed on hold. He has been using Neosporin to the ulcerated areas. He is supposed to see Dr. Malachy Mood back next week in the interim he was seeing [place] for further evaluation of these areas to see if there is anything we can do to speed up the healing. The patient did have in August 2022 a: Resection with hemicolectomy. 1/18; This patient is a apparently had hand-foot syndrome secondary to Xeloda. This livedo has actually been on for 2 weeks. He had dry scaly skin on his hands that is improving. At 1 point he had an area on the right lateral foot this is healed. He has a small remaining area on the plantar aspect of the left first toe this is just about closed. There is extension of the left midfoot just below the heads he has an open area but even this looks like it is epithelializing medially. We ordered Xeroform for him last week. For some reason this did not come through and he simply been using topical antibiotics. 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 He has been tolerating the dressing changes without complication. Fortunately I think that he is actually making excellent progress at this time his right foot is completely healed the left foot though not completely healed appears to be doing much better. No fevers, chills, nausea, vomiting, or diarrhea. Objective Constitutional Well-nourished and well-hydrated in no acute distress. Vitals Time Taken: 7:50 AM, Temperature: 98.5 F, Pulse: 99 bpm, Respiratory Rate: 20 breaths/min, Blood Pressure: 181/93 mmHg,  Capillary Blood Glucose: 390 mg/dl. General Notes: per patient has not taken any  insulin this morning and ate breakfast. Also per patient has been running around this morning. Respiratory normal breathing without difficulty. Psychiatric this patient is able to make decisions and demonstrates good insight into disease process. Alert and Oriented x 3. pleasant and cooperative. General Notes: Upon inspection patient's wound bed actually showed signs of good granulation and epithelization at this point. Fortunately there does not appear to be any evidence of active infection locally nor systemically at this time. No fevers, chills, nausea, vomiting, or diarrhea. He did start back on his chemotherapy treatment yesterday but it is at half the dose hopefully is not can to cause any issues here. Integumentary (Hair, Skin) Wound #1 status is Open. Original cause of wound was Gradually Appeared. The date acquired was: 09/24/2021. The wound has been in treatment 2 weeks. The wound is located on the [place]. The wound measures 0.3cm length x 1.2cm width x 0.1cm depth; 0.283cm^2 area and 0.028cm^3 volume. There is Fat Layer (Subcutaneous Tissue) exposed. There is no tunneling or undermining noted. There is a medium amount of serosanguineous drainage noted. The wound margin is distinct with the outline attached to the wound base. There is large (67-100%) pink granulation within the wound bed. There is no necrotic tissue within the wound bed. Wound #2 status is Healed - Epithelialized. Original cause of wound was Gradually Appeared. The date acquired was: 09/24/2021. The wound has been in treatment 2 weeks. The wound is located on the [employer]. The wound measures 0cm length x 0cm width x 0cm depth; 0cm^2 area and 0cm^3 oe volume. There is Fat Layer (Subcutaneous Tissue) exposed. There is no tunneling or undermining noted. There is a none present amount of drainage noted. The wound margin is  distinct with the outline attached to the wound base. There is no granulation within the wound bed. There is no necrotic tissue within the wound bed. 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 - with Margarita Grizzle Other: - Urea Cream 40% any thick harden callous areas can apply daily at night. Bathing/ Shower/ Hygiene: May shower and wash wound with soap and water. Off-Loading: Wedge shoe to: - Front off-loading shoe to left foot Additional Orders / Instructions: Follow Nutritious Diet - Monitor/Control Blood Sugar WOUND #1: - Foot Wound Laterality: Plantar, Left Cleanser: Soap and Water 1 x Per Day/30 Days Discharge Instructions: May shower and wash wound with dial antibacterial soap and water prior to dressing change. Prim Dressing: Hydrofera Blue Ready Foam, 2.5 x2.5 in 1 x Per Day/30 Days ary Discharge Instructions: Apply to wound bed as instructed Secondary Dressing: Woven Gauze Sponge, Non-Sterile 4x4 in (Generic) 1 x Per Day/30 Days Discharge Instructions: Apply over primary dressing as directed. Secured With: Child psychotherapist, Sterile 2x75 (in/in) (Generic) 1 x Per Day/30 Days Discharge Instructions: Secure with stretch gauze as directed. Secured With: Paper T ape, 2x10 (in/yd) (Generic) 1 x Per Day/30 Days Discharge Instructions: Secure dressing with tape as directed. 1. Would recommend currently that we going continue with the wound care measures as before and the patient is in agreement with plan. This includes the use of the [facility] which  has done well up  to this time. 2. I am also going to recommend that we have the patient continue with the Limited activity to try to help prevent anything from worsening in regard to the plantar wound of the left foot. This is getting very close to complete closure honestly I think he is healing quite well no significant callus buildup today. We will see patient back for reevaluation in 1 week here in the clinic. If anything worsens or changes patient will contact our office for additional recommendations. Electronic Signature(s) Signed: 10/18/2021 9:29:55 AM By: Worthy Keeler PA-C Entered By: Worthy Keeler on 10/18/2021 09:29:54 -------------------------------------------------------------------------------- SuperBill Details Patient Name: Date of Service: Wyatt Son D. 10/18/2021 Medical Record Number: [id number] Patient Account Number: [id number] Date of Birth/Sex: Treating RN: [birth year] (61 y.o. 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 L84 Corns and callosities Facility Procedures CPT4 Code: [id number] Description: 99214 - WOUND CARE VISIT-LEV 4 EST PT Modifier: Quantity: 1 Physician Procedures : CPT4 Code Description Modifier [id number] - WC PHYS LEVEL 4 - EST PT ICD-10 Diagnosis Description E11.621 Type 2 diabetes mellitus with foot ulcer L97.522 Non-pressure chronic ulcer of other part of left foot with fat layer exposed L97.512   Non-pressure chronic ulcer of other part of right foot with fat layer exposed C18.8 Malignant neoplasm of overlapping sites of colon Quantity: 1 Electronic Signature(s) Signed: 10/18/2021 9:30:24 AM By: Worthy Keeler PA-C Entered By: Worthy Keeler on 10/18/2021 09:30:23

## 2021-10-18 NOTE — Progress Notes (Signed)
ARATH, KAIGLER ([id number]) Visit Report for 10/18/2021 Arrival Information Details Patient Name: Date of Service: Wyatt Meyer, Wyatt Meyer [place] D. 10/18/2021 7:30 A M Medical Record Number: [id number] Patient Account Number: [id number] Date of Birth/Sex: Treating RN: Mar 17, 1961 (61 y.o. Lorette Ang, Meta.Reding Primary Care Shima Compere: Dimas Chyle Other Clinician: Referring Esterlene Atiyeh: Treating Ezrie Bunyan/Extender: Lebron Quam in Treatment: 2 Visit Information History Since Last Visit Added or deleted any medications: No Patient Arrived: Ambulatory Any new allergies or adverse reactions: No Arrival Time: 07:47 Had a fall or experienced change in No Accompanied By: family activities of daily living that may affect Transfer Assistance: None risk of falls: Patient Identification Verified: Yes Signs or symptoms of abuse/neglect since last visito No Secondary Verification Process Completed: Yes Hospitalized since last visit: No Patient Requires Transmission-Based Precautions: No Implantable device outside of the clinic excluding No Patient Has Alerts: No cellular tissue based products placed in the center since last visit: Has Dressing in Place as Prescribed: Yes Pain Present Now: No Electronic Signature(s) Signed: 10/18/2021 4:52:26 PM By: Deon Pilling RN, BSN Entered By: Deon Pilling on 10/18/2021 07:50:08 -------------------------------------------------------------------------------- Clinic Level of Care Assessment Details Patient Name: Date of Service: Wyatt Meyer D. 10/18/2021 7:30 A M Medical Record Number: [id number] Patient Account Number: [id number] Date of Birth/Sex: Treating RN: 1960-09-25 (61 y.o. [name] Primary Care Ethel Meisenheimer: Dimas Chyle Other Clinician: Referring Kamille Toomey: Treating Neosha Switalski/Extender: Lebron Quam in Treatment: 2 Clinic Level of Care Assessment Items TOOL 4 Quantity Score X- 1 0 Use when only an EandM  is performed on FOLLOW-UP visit ASSESSMENTS - Nursing Assessment / Reassessment X- 1 10 Reassessment of Co-morbidities (includes updates in patient status) X- 1 5 Reassessment of Adherence to Treatment Plan ASSESSMENTS - Wound and Skin A ssessment / Reassessment []  - 0 Simple Wound Assessment / Reassessment - one wound X- 2 5 Complex Wound Assessment / Reassessment - multiple wounds X- 1 10 Dermatologic / Skin Assessment (not related to wound area) ASSESSMENTS - Focused Assessment X- 2 5 Circumferential Edema Measurements - multi extremities X- 1 10 Nutritional Assessment / Counseling / Intervention []  - 0 Lower Extremity Assessment (monofilament, tuning fork, pulses) []  - 0 Peripheral Arterial Disease Assessment (using hand held doppler) ASSESSMENTS - Ostomy and/or Continence Assessment and Care []  - 0 Incontinence Assessment and Management []  - 0 Ostomy Care Assessment and Management (repouching, etc.) PROCESS - Coordination of Care []  - 0 Simple Patient / Family Education for ongoing care X- 1 20 Complex (extensive) Patient / Family Education for ongoing care X- 1 10 Staff obtains Programmer, systems, Records, T Results / Process Orders est []  - 0 Staff telephones HHA, Nursing Homes / Clarify orders / etc []  - 0 Routine Transfer to another Facility (non-emergent condition) []  - 0 Routine Hospital Admission (non-emergent condition) []  - 0 New Admissions / Biomedical engineer / Ordering NPWT Apligraf, etc. , []  - 0 Emergency Hospital Admission (emergent condition) []  - 0 Simple Discharge Coordination X- 1 15 Complex (extensive) Discharge Coordination PROCESS - Special Needs []  - 0 Pediatric / Minor Patient Management []  - 0 Isolation Patient Management []  - 0 Hearing / Language / Visual special needs []  - 0 Assessment of Community assistance (transportation, D/C planning, etc.) []  - 0 Additional assistance / Altered mentation []  - 0 Support Surface(s)  Assessment (bed, cushion, seat, etc.) INTERVENTIONS - Wound Cleansing / Measurement []  - 0 Simple Wound Cleansing - one wound X- 2 5 Complex Wound Cleansing -  multiple wounds X- 1 5 Wound Imaging (photographs - any number of wounds) []  - 0 Wound Tracing (instead of photographs) []  - 0 Simple Wound Measurement - one wound X- 2 5 Complex Wound Measurement - multiple wounds INTERVENTIONS - Wound Dressings X - Small Wound Dressing one or multiple wounds 1 10 []  - 0 Medium Wound Dressing one or multiple wounds []  - 0 Large Wound Dressing one or multiple wounds []  - 0 Application of Medications - topical []  - 0 Application of Medications - injection INTERVENTIONS - Miscellaneous []  - 0 External ear exam []  - 0 Specimen Collection (cultures, biopsies, blood, body fluids, etc.) []  - 0 Specimen(s) / Culture(s) sent or taken to Lab for analysis []  - 0 Patient Transfer (multiple staff / Civil Service fast streamer / Similar devices) []  - 0 Simple Staple / Suture removal (25 or less) []  - 0 Complex Staple / Suture removal (26 or more) []  - 0 Hypo / Hyperglycemic Management (close monitor of Blood Glucose) []  - 0 Ankle / Brachial Index (ABI) - do not check if billed separately X- 1 5 Vital Signs Has the patient been seen at the hospital within the last three years: Yes Total Score: 140 Level Of Care: New/Established - Level 4 Electronic Signature(s) Signed: 10/18/2021 4:52:26 PM By: Deon Pilling RN, BSN Entered By: Deon Pilling on 10/18/2021 08:23:38 -------------------------------------------------------------------------------- Encounter Discharge Information Details Patient Name: Date of Service: Wyatt Son D. 10/18/2021 7:30 A M Medical Record Number: [id number] Patient Account Number: [id number] Date of Birth/Sex: Treating RN: [birth year] (61 y.o. [name] Primary Care Nonie Lochner: Dimas Chyle Other Clinician: Referring Amel Kitch: Treating Rhonin Trott/Extender: Lebron Quam in Treatment: 2 Encounter Discharge Information Items Discharge Condition: Stable Ambulatory Status: Ambulatory Discharge Destination: Home Transportation: Private Auto Accompanied By: family Schedule Follow-up Appointment: Yes Clinical Summary of Care: Notes Rechecked BP 166/83. Per patient never has high blood pressure. Denies pain, headache, dizziness, or ringing in the ears. Explained to patient to call [place] or PCP related to any symptoms or elevated BP. Patient in agreement. Electronic Signature(s) Signed: 10/18/2021 4:52:26 PM By: Deon Pilling RN, BSN Entered By: Deon Pilling on 10/18/2021 08:40:58 -------------------------------------------------------------------------------- Lower Extremity Assessment Details Patient Name: Date of Service: Wyatt Son D. 10/18/2021 7:30 A M Medical Record Number: [id number] Patient Account Number: [id number] Date of Birth/Sex: Treating RN: 1961/04/30 (61 y.o. [name] Primary Care Franchon Ketterman: Dimas Chyle Other Clinician: Referring Ashantee Deupree: Treating Retina Bernardy/Extender: Welford Roche Weeks in Treatment: 2 Edema Assessment Assessed: [Left: Yes] [Right: Yes] Edema: [Left: No] [Right: No] Calf Left: Right: Point of Measurement: From Medial Instep 38 cm 39 cm Ankle Left: Right: Point of Measurement: From Medial Instep 25.5 cm 24 cm Vascular Assessment Pulses: Dorsalis Pedis Palpable: [Left:Yes] [Right:Yes] Electronic Signature(s) Signed: 10/18/2021 4:52:26 PM By: Deon Pilling RN, BSN Entered By: Deon Pilling on 10/18/2021 07:53:29 -------------------------------------------------------------------------------- [place] Details Patient Name: Date of Service: Wyatt Son D. 10/18/2021 7:30 A M Medical Record Number: [id number] Patient Account Number: [id number] Date of Birth/Sex: Treating RN: 03-08-1961 (61 y.o. [name] Primary Care  Estie Sproule: Dimas Chyle Other Clinician: Referring Kieren Adkison: Treating Julanne Schlueter/Extender: Lebron Quam in Treatment: 2 Multidisciplinary Care Plan reviewed with physician Active Inactive Abuse / Safety / Falls / Self Care Management Nursing Diagnoses: History of Falls Potential for falls Goals: Patient/caregiver will verbalize/demonstrate measures taken to prevent injury and/or falls Date Initiated: 10/04/2021 Target Resolution Date: 11/01/2021 Goal  Status: Active Interventions: Assess fall risk on admission and as needed Notes: Nutrition Nursing Diagnoses: Impaired glucose control: actual or potential Potential for alteratiion in Nutrition/Potential for imbalanced nutrition Goals: Patient/caregiver will maintain therapeutic glucose control Date Initiated: 10/04/2021 Target Resolution Date: 11/01/2021 Goal Status: Active Interventions: Assess HgA1c results as ordered upon admission and as needed Assess patient nutrition upon admission and as needed per policy Provide education on elevated blood sugars and impact on wound healing Treatment Activities: Patient referred to Primary Care Physician for further nutritional evaluation : 10/04/2021 Notes: Wound/Skin Impairment Nursing Diagnoses: Impaired tissue integrity Knowledge deficit related to ulceration/compromised skin integrity Goals: Patient/caregiver will verbalize understanding of skin care regimen Date Initiated: 10/04/2021 Target Resolution Date: 11/01/2021 Goal Status: Active Ulcer/skin breakdown will have a volume reduction of 30% by week 4 Date Initiated: 10/04/2021 Target Resolution Date: 11/01/2021 Goal Status: Active Interventions: Assess patient/caregiver ability to obtain necessary supplies Assess patient/caregiver ability to perform ulcer/skin care regimen upon admission and as needed Assess ulceration(s) every visit Provide education on ulcer and skin care Treatment Activities: Skin care  regimen initiated : 10/04/2021 Topical wound management initiated : 10/04/2021 Notes: Electronic Signature(s) Signed: 10/18/2021 4:52:26 PM By: Deon Pilling RN, BSN Entered By: Deon Pilling on 10/18/2021 08:05:11 -------------------------------------------------------------------------------- Pain Assessment Details Patient Name: Date of Service: Wyatt Son D. 10/18/2021 7:30 A M Medical Record Number: [id number] Patient Account Number: [id number] Date of Birth/Sex: Treating RN: 1961/04/05 (61 y.o. [name] Primary Care Odessa Nishi: Dimas Chyle Other Clinician: Referring Martie Muhlbauer: Treating Aziah Kaiser/Extender: Welford Roche Weeks in Treatment: 2 Active Problems Location of Pain Severity and Description of Pain Patient Has Paino No Site Locations Rate the pain. Current Pain Level: 0 Pain Management and Medication Current Pain Management: Medication: No Cold Application: No Rest: No Massage: No Activity: No T.E.N.S.: No Heat Application: No Leg drop or elevation: No Is the Current Pain Management Adequate: Adequate How does your wound impact your activities of daily livingo Sleep: No Bathing: No Appetite: No Relationship With Others: No Bladder Continence: No Emotions: No Bowel Continence: No Work: No Toileting: No Drive: No Dressing: No Hobbies: No Engineer, maintenance) Signed: 10/18/2021 4:52:26 PM By: Deon Pilling RN, BSN Entered By: Deon Pilling on 10/18/2021 07:51:52 -------------------------------------------------------------------------------- Patient/Caregiver Education Details Patient Name: Date of Service: Wyatt Son D. 1/25/2023andnbsp7:30 A M Medical Record Number: [id number] Patient Account Number: [id number] Date of Birth/Gender: Treating RN: April 30, 1961 (61 y.o. [name] Primary Care Physician: Dimas Chyle Other Clinician: Referring Physician: Treating Physician/Extender: Lebron Quam in Treatment: 2 Education Assessment Education Provided To: Patient Education Topics Provided Wound/Skin Impairment: Handouts: Skin Care Do's and Dont's Methods: Explain/Verbal Responses: Reinforcements needed Electronic Signature(s) Signed: 10/18/2021 4:52:26 PM By: Deon Pilling RN, BSN Entered By: Deon Pilling on 10/18/2021 08:06:15 -------------------------------------------------------------------------------- Wound Assessment Details Patient Name: Date of Service: Wyatt Son D. 10/18/2021 7:30 A M Medical Record Number: [id number] Patient Account Number: [id number] Date of Birth/Sex: Treating RN: 1960-09-30 (61 y.o. [name] Primary Care Marvelle Span: Dimas Chyle Other Clinician: Referring Bentlee Drier: Treating Sabiha Sura/Extender: Welford Roche Weeks in Treatment: 2 Wound Status Wound Number: 1 Primary Etiology: Diabetic Wound/Ulcer of the Lower Extremity Wound Location: Left, Plantar Foot Secondary Medication Related Etiology: Wounding Event: Gradually Appeared Wound Status: Open Date Acquired: 09/24/2021 Comorbid Anemia, Type II Diabetes, Neuropathy, Received Weeks Of Treatment: 2 History: Chemotherapy Clustered Wound: No Photos Wound Measurements Length: (cm) 0.3 Width: (cm) 1.2 Depth: (cm) 0.1 Area: (  cm) 0.283 Volume: (cm) 0.028 % Reduction in Area: 49.9% % Reduction in Volume: 50.9% Epithelialization: Medium (34-66%) Tunneling: No Undermining: No Wound Description Classification: Grade 1 Wound Margin: Distinct, outline attached Exudate Amount: Medium Exudate Type: Serosanguineous Exudate Color: red, brown Foul Odor After Cleansing: No Slough/Fibrino No Wound Bed Granulation Amount: Large (67-100%) Exposed Structure Granulation Quality: Pink Fascia Exposed: No Necrotic Amount: None Present (0%) Fat Layer (Subcutaneous Tissue) Exposed: Yes Tendon Exposed: No Muscle Exposed: No Joint Exposed: No Bone  Exposed: No Treatment Notes Wound #1 (Foot) Wound Laterality: Plantar, Left Cleanser Soap and Water Discharge Instruction: May shower and wash wound with dial antibacterial soap and water prior to dressing change. Peri-Wound Care Topical Primary Dressing Hydrofera Blue Ready Foam, 2.5 x2.5 in Discharge Instruction: Apply to wound bed as instructed Secondary Dressing Woven Gauze Sponge, Non-Sterile 4x4 in Discharge Instruction: Apply over primary dressing as directed. Secured With Conforming Stretch Gauze Bandage, Sterile 2x75 (in/in) Discharge Instruction: Secure with stretch gauze as directed. Paper Tape, 2x10 (in/yd) Discharge Instruction: Secure dressing with tape as directed. Compression Wrap Compression Stockings Add-Ons Notes applied lotion and gauze to right foot. Electronic Signature(s) Signed: 10/18/2021 4:46:14 PM By: Baruch Gouty RN, BSN Signed: 10/18/2021 4:52:26 PM By: Deon Pilling RN, BSN Entered By: Deon Pilling on 10/18/2021 07:55:32 -------------------------------------------------------------------------------- Wound Assessment Details Patient Name: Date of Service: Wyatt Son D. 10/18/2021 7:30 A M Medical Record Number: [id number] Patient Account Number: [id number] Date of Birth/Sex: Treating RN: 1961/01/10 (61 y.o. [name] Primary Care Ginia Rudell: Dimas Chyle Other Clinician: Referring Thania Woodlief: Treating Deserie Dirks/Extender: Welford Roche Weeks in Treatment: 2 Wound Status Wound Number: 2 Primary Etiology: Diabetic Wound/Ulcer of the Lower Extremity Wound Location: Right, Plantar T Great oe Secondary Medication Related Etiology: Wounding Event: Gradually Appeared Wound Status: Healed - Epithelialized Date Acquired: 09/24/2021 Comorbid Anemia, Type II Diabetes, Neuropathy, Received Weeks Of Treatment: 2 History: Chemotherapy Clustered Wound: No Photos Wound Measurements Length: (cm) 0 Width: (cm) 0 Depth: (cm)  0 Area: (cm) Volume: (cm) % Reduction in Area: 100% % Reduction in Volume: 100% Epithelialization: Large (67-100%) 0 Tunneling: No 0 Undermining: No Wound Description Classification: Grade 1 Wound Margin: Distinct, outline attached Exudate Amount: None Present Foul Odor After Cleansing: No Slough/Fibrino No Wound Bed Granulation Amount: None Present (0%) Exposed Structure Necrotic Amount: None Present (0%) Fascia Exposed: No Fat Layer (Subcutaneous Tissue) Exposed: Yes Tendon Exposed: No Muscle Exposed: No Joint Exposed: No Bone Exposed: No Treatment Notes Wound #2 (Toe Great) Wound Laterality: Plantar, Right Cleanser Peri-Wound Care Topical Primary Dressing Secondary Dressing Secured With Compression Wrap Compression Stockings Add-Ons Notes applied lotion and gauze to right foot. Electronic Signature(s) Signed: 10/18/2021 4:52:26 PM By: Deon Pilling RN, BSN Entered By: Deon Pilling on 10/18/2021 08:21:51 -------------------------------------------------------------------------------- Vitals Details Patient Name: Date of Service: Wyatt Son D. 10/18/2021 7:30 A M Medical Record Number: [id number] Patient Account Number: [id number] Date of Birth/Sex: Treating RN: 1961/04/25 (61 y.o. [name] Primary Care Adaia Matthies: Dimas Chyle Other Clinician: Referring Zea Kostka: Treating Maudy Yonan/Extender: Welford Roche Weeks in Treatment: 2 Vital Signs Time Taken: 07:50 Temperature (F): 98.5 Pulse (bpm): 99 Respiratory Rate (breaths/min): 20 Blood Pressure (mmHg): 181/93 Capillary Blood Glucose (mg/dl): 390 Reference Range: 80 - 120 mg / dl Notes per patient has not taken any insulin this morning and ate breakfast. Also per patient has been running around this morning. Electronic Signature(s) Signed: 10/18/2021 4:52:26 PM By: Deon Pilling RN, BSN Entered By: Deon Pilling on  10/18/2021 07:51:31 °

## 2021-10-19 ENCOUNTER — Other Ambulatory Visit (HOSPITAL_COMMUNITY): Payer: Self-pay

## 2021-10-19 LAB — URINE CULTURE
MICRO NUMBER:: 12912105
SPECIMEN QUALITY:: ADEQUATE

## 2021-10-19 MED FILL — Capecitabine Tab 500 MG: ORAL | 21 days supply | Qty: 56 | Fill #0 | Status: CN

## 2021-10-19 NOTE — Telephone Encounter (Signed)
Just started cycle on 1/23-will call patient to ensure he has enough to complete the cycle.

## 2021-10-19 NOTE — Progress Notes (Signed)
Please inform patient of the following:  His culture confirms UTI.  The antibiotic we have him on should treat this.  Would like for him to let us know if symptoms do not improve.

## 2021-10-25 ENCOUNTER — Other Ambulatory Visit: Payer: Self-pay

## 2021-10-25 ENCOUNTER — Encounter (HOSPITAL_BASED_OUTPATIENT_CLINIC_OR_DEPARTMENT_OTHER): Payer: BC Managed Care – PPO | Attending: Physician Assistant | Admitting: Physician Assistant

## 2021-10-25 DIAGNOSIS — E11621 Type 2 diabetes mellitus with foot ulcer: Secondary | ICD-10-CM | POA: Diagnosis present

## 2021-10-25 DIAGNOSIS — L97512 Non-pressure chronic ulcer of other part of right foot with fat layer exposed: Secondary | ICD-10-CM | POA: Diagnosis not present

## 2021-10-25 DIAGNOSIS — L84 Corns and callosities: Secondary | ICD-10-CM | POA: Diagnosis not present

## 2021-10-25 DIAGNOSIS — L97522 Non-pressure chronic ulcer of other part of left foot with fat layer exposed: Secondary | ICD-10-CM | POA: Insufficient documentation

## 2021-10-25 DIAGNOSIS — C188 Malignant neoplasm of overlapping sites of colon: Secondary | ICD-10-CM | POA: Insufficient documentation

## 2021-10-25 DIAGNOSIS — E1142 Type 2 diabetes mellitus with diabetic polyneuropathy: Secondary | ICD-10-CM | POA: Diagnosis not present

## 2021-10-25 DIAGNOSIS — D630 Anemia in neoplastic disease: Secondary | ICD-10-CM | POA: Insufficient documentation

## 2021-10-25 DIAGNOSIS — Z9049 Acquired absence of other specified parts of digestive tract: Secondary | ICD-10-CM | POA: Insufficient documentation

## 2021-10-25 NOTE — Progress Notes (Signed)
Wyatt Meyer, Wyatt Meyer ([id number]) Visit Report for 10/25/2021 Arrival Information Details Patient Name: Date of Service: Wyatt Meyer, Wyatt Meyer [place] D. 10/25/2021 7:30 A M Medical Record Number: [id number] Patient Account Number: [id number] Date of Birth/Sex: Treating RN: 04-15-1961 (61 y.o. Wyatt Meyer, Wyatt Meyer Primary Care Diante Barley: Dimas Chyle Other Clinician: Referring Juliza Machnik: Treating Hartman Minahan/Extender: Lebron Quam in Treatment: 3 Visit Information History Since Last Visit Added or deleted any medications: No Patient Arrived: Ambulatory Any new allergies or adverse reactions: No Arrival Time: 07:50 Had a fall or experienced change in No Accompanied By: family activities of daily living that may affect Transfer Assistance: None risk of falls: Patient Identification Verified: Yes Signs or symptoms of abuse/neglect since last visito No Secondary Verification Process Completed: Yes Hospitalized since last visit: No Patient Requires Transmission-Based Precautions: No Implantable device outside of the clinic excluding No Patient Has Alerts: No cellular tissue based products placed in the center since last visit: Has Dressing in Place as Prescribed: Yes Pain Present Now: No Electronic Signature(s) Signed: 10/25/2021 5:21:41 PM By: Baruch Gouty RN, BSN Entered By: Baruch Gouty on 10/25/2021 07:51:49 -------------------------------------------------------------------------------- Clinic Level of Care Assessment Details Patient Name: Date of Service: Wyatt Meyer, Wyatt Core D. 10/25/2021 7:30 A M Medical Record Number: [id number] Patient Account Number: [id number] Date of Birth/Sex: Treating RN: 12-12-1960 (61 y.o. Wyatt Meyer Primary Care Madeeha Costantino: Dimas Chyle Other Clinician: Referring Kameron Glazebrook: Treating Merrick Feutz/Extender: Lebron Quam in Treatment: 3 Clinic Level of Care Assessment Items TOOL 4 Quantity Score []  - 0 Use when only an  EandM is performed on FOLLOW-UP visit ASSESSMENTS - Nursing Assessment / Reassessment X- 1 10 Reassessment of Co-morbidities (includes updates in patient status) X- 1 5 Reassessment of Adherence to Treatment Plan ASSESSMENTS - Wound and Skin A ssessment / Reassessment X - Simple Wound Assessment / Reassessment - one wound 1 5 []  - 0 Complex Wound Assessment / Reassessment - multiple wounds []  - 0 Dermatologic / Skin Assessment (not related to wound area) ASSESSMENTS - Focused Assessment X- 1 5 Circumferential Edema Measurements - multi extremities []  - 0 Nutritional Assessment / Counseling / Intervention X- 1 5 Lower Extremity Assessment (monofilament, tuning fork, pulses) []  - 0 Peripheral Arterial Disease Assessment (using hand held doppler) ASSESSMENTS - Ostomy and/or Continence Assessment and Care []  - 0 Incontinence Assessment and Management []  - 0 Ostomy Care Assessment and Management (repouching, etc.) PROCESS - Coordination of Care X - Simple Patient / Family Education for ongoing care 1 15 []  - 0 Complex (extensive) Patient / Family Education for ongoing care X- 1 10 Staff obtains Programmer, systems, Records, T Results / Process Orders est []  - 0 Staff telephones HHA, Nursing Homes / Clarify orders / etc []  - 0 Routine Transfer to another Facility (non-emergent condition) []  - 0 Routine Hospital Admission (non-emergent condition) []  - 0 New Admissions / Biomedical engineer / Ordering NPWT Apligraf, etc. , []  - 0 Emergency Hospital Admission (emergent condition) X- 1 10 Simple Discharge Coordination []  - 0 Complex (extensive) Discharge Coordination PROCESS - Special Needs []  - 0 Pediatric / Minor Patient Management []  - 0 Isolation Patient Management []  - 0 Hearing / Language / Visual special needs []  - 0 Assessment of Community assistance (transportation, D/C planning, etc.) []  - 0 Additional assistance / Altered mentation []  - 0 Support Surface(s)  Assessment (bed, cushion, seat, etc.) INTERVENTIONS - Wound Cleansing / Measurement X - Simple Wound Cleansing - one wound 1 5 []  - 0  Complex Wound Cleansing - multiple wounds X- 1 5 Wound Imaging (photographs - any number of wounds) []  - 0 Wound Tracing (instead of photographs) X- 1 5 Simple Wound Measurement - one wound []  - 0 Complex Wound Measurement - multiple wounds INTERVENTIONS - Wound Dressings X - Small Wound Dressing one or multiple wounds 1 10 []  - 0 Medium Wound Dressing one or multiple wounds []  - 0 Large Wound Dressing one or multiple wounds []  - 0 Application of Medications - topical []  - 0 Application of Medications - injection INTERVENTIONS - Miscellaneous []  - 0 External ear exam []  - 0 Specimen Collection (cultures, biopsies, blood, body fluids, etc.) []  - 0 Specimen(s) / Culture(s) sent or taken to Lab for analysis []  - 0 Patient Transfer (multiple staff / Civil Service fast streamer / Similar devices) []  - 0 Simple Staple / Suture removal (25 or less) []  - 0 Complex Staple / Suture removal (26 or more) []  - 0 Hypo / Hyperglycemic Management (close monitor of Blood Glucose) []  - 0 Ankle / Brachial Index (ABI) - do not check if billed separately X- 1 5 Vital Signs Has the patient been seen at the hospital within the last three years: Yes Total Score: 95 Level Of Care: New/Established - Level 3 Electronic Signature(s) Signed: 10/25/2021 5:21:41 PM By: Baruch Gouty RN, BSN Entered By: Baruch Gouty on 10/25/2021 08:16:20 -------------------------------------------------------------------------------- Encounter Discharge Information Details Patient Name: Date of Service: Wyatt Son D. 10/25/2021 7:30 A M Medical Record Number: [id number] Patient Account Number: [id number] Date of Birth/Sex: Treating RN: 1961-06-12 (61 y.o. Wyatt Meyer Primary Care Lenah Messenger: Dimas Chyle Other Clinician: Referring Treanna Dumler: Treating Aliyana Dlugosz/Extender: Lebron Quam in Treatment: 3 Encounter Discharge Information Items Discharge Condition: Stable Ambulatory Status: Ambulatory Discharge Destination: Home Transportation: Private Auto Accompanied By: family Schedule Follow-up Appointment: Yes Clinical Summary of Care: Patient Declined Electronic Signature(s) Signed: 10/25/2021 5:21:41 PM By: Baruch Gouty RN, BSN Entered By: Baruch Gouty on 10/25/2021 08:17:35 -------------------------------------------------------------------------------- Lower Extremity Assessment Details Patient Name: Date of Service: Wyatt Son D. 10/25/2021 7:30 A M Medical Record Number: [id number] Patient Account Number: [id number] Date of Birth/Sex: Treating RN: [birth year] (61 y.o. Wyatt Meyer Primary Care Carol Loftin: Dimas Chyle Other Clinician: Referring Jarick Harkins: Treating Alexzander Dolinger/Extender: Welford Roche Weeks in Treatment: 3 Edema Assessment Assessed: [Left: No] [Right: No] Edema: [Left: N] [Right: o] Calf Left: Right: Point of Measurement: From Medial Instep 38 cm Ankle Left: Right: Point of Measurement: From Medial Instep 23 cm Vascular Assessment Pulses: Dorsalis Pedis Palpable: [Left:Yes] Electronic Signature(s) Signed: 10/25/2021 5:21:41 PM By: Baruch Gouty RN, BSN Entered By: Baruch Gouty on 10/25/2021 07:53:27 -------------------------------------------------------------------------------- [place] Details Patient Name: Date of Service: Wyatt Son D. 10/25/2021 7:30 A M Medical Record Number: [id number] Patient Account Number: [id number] Date of Birth/Sex: Treating RN: [birth year] (61 y.o. Wyatt Meyer Primary Care Minie Roadcap: Dimas Chyle Other Clinician: Referring Desmon Hitchner: Treating Ardit Danh/Extender: Lebron Quam in Treatment: 3 Multidisciplinary Care Plan reviewed with physician Active Inactive Abuse / Safety / Falls / Self  Care Management Nursing Diagnoses: History of Falls Potential for falls Goals: Patient/caregiver will verbalize/demonstrate measures taken to prevent injury and/or falls Date Initiated: 10/04/2021 Target Resolution Date: 11/01/2021 Goal Status: Active Interventions: Assess fall risk on admission and as needed Notes: Nutrition Nursing Diagnoses: Impaired glucose control: actual or potential Potential for alteratiion in Nutrition/Potential for imbalanced nutrition Goals: Patient/caregiver will maintain therapeutic glucose control Date Initiated:  10/04/2021 Target Resolution Date: 11/01/2021 Goal Status: Active Interventions: Assess HgA1c results as ordered upon admission and as needed Assess patient nutrition upon admission and as needed per policy Provide education on elevated blood sugars and impact on wound healing Treatment Activities: Patient referred to Primary Care Physician for further nutritional evaluation : 10/04/2021 Notes: Wound/Skin Impairment Nursing Diagnoses: Impaired tissue integrity Knowledge deficit related to ulceration/compromised skin integrity Goals: Patient/caregiver will verbalize understanding of skin care regimen Date Initiated: 10/04/2021 Target Resolution Date: 11/01/2021 Goal Status: Active Ulcer/skin breakdown will have a volume reduction of 30% by week 4 Date Initiated: 10/04/2021 Target Resolution Date: 11/01/2021 Goal Status: Active Interventions: Assess patient/caregiver ability to obtain necessary supplies Assess patient/caregiver ability to perform ulcer/skin care regimen upon admission and as needed Assess ulceration(s) every visit Provide education on ulcer and skin care Treatment Activities: Skin care regimen initiated : 10/04/2021 Topical wound management initiated : 10/04/2021 Notes: Electronic Signature(s) Signed: 10/25/2021 5:21:41 PM By: Baruch Gouty RN, BSN Entered By: Baruch Gouty on 10/25/2021  07:55:51 -------------------------------------------------------------------------------- Pain Assessment Details Patient Name: Date of Service: Wyatt Son D. 10/25/2021 7:30 A M Medical Record Number: [id number] Patient Account Number: [id number] Date of Birth/Sex: Treating RN: 20-Apr-1961 (61 y.o. Wyatt Meyer Primary Care Saanvi Hakala: Dimas Chyle Other Clinician: Referring Franziska Podgurski: Treating Soni Kegel/Extender: Lebron Quam in Treatment: 3 Active Problems Location of Pain Severity and Description of Pain Patient Has Paino No Site Locations Rate the pain. Current Pain Level: 0 Pain Management and Medication Current Pain Management: Electronic Signature(s) Signed: 10/25/2021 5:21:41 PM By: Baruch Gouty RN, BSN Entered By: Baruch Gouty on 10/25/2021 07:52:52 -------------------------------------------------------------------------------- Patient/Caregiver Education Details Patient Name: Date of Service: Wyatt Meyer 2/1/2023andnbsp7:30 [place] Record Number: [id number] Patient Account Number: [id number] Date of Birth/Gender: Treating RN: [birth year] (61 y.o. Wyatt Meyer Primary Care Physician: Dimas Chyle Other Clinician: Referring Physician: Treating Physician/Extender: Lebron Quam in Treatment: 3 Education Assessment Education Provided To: Patient Education Topics Provided Elevated Blood Sugar/ Impact on Healing: Methods: Explain/Verbal Responses: Reinforcements needed, State content correctly Wound/Skin Impairment: Methods: Explain/Verbal Responses: Reinforcements needed, State content correctly Electronic Signature(s) Signed: 10/25/2021 5:21:41 PM By: Baruch Gouty RN, BSN Entered By: Baruch Gouty on 10/25/2021 07:56:15 -------------------------------------------------------------------------------- Wound Assessment Details Patient Name: Date of Service: Wyatt Son D.  10/25/2021 7:30 A M Medical Record Number: [id number] Patient Account Number: [id number] Date of Birth/Sex: Treating RN: 26-Apr-1961 (61 y.o. Wyatt Meyer Primary Care Shynice Sigel: Dimas Chyle Other Clinician: Referring Avante Carneiro: Treating Ghali Morissette/Extender: Welford Roche Weeks in Treatment: 3 Wound Status Wound Number: 1 Primary Etiology: Diabetic Wound/Ulcer of the Lower Extremity Wound Location: Left, Plantar Foot Secondary Medication Related Etiology: Wounding Event: Gradually Appeared Wound Status: Open Date Acquired: 09/24/2021 Comorbid Anemia, Type II Diabetes, Neuropathy, Received Weeks Of Treatment: 3 History: Chemotherapy Clustered Wound: No Photos Wound Measurements Length: (cm) 0.3 Width: (cm) 0.7 Depth: (cm) 0.1 Area: (cm) 0.165 Volume: (cm) 0.016 % Reduction in Area: 70.8% % Reduction in Volume: 71.9% Epithelialization: Small (1-33%) Tunneling: No Undermining: No Wound Description Classification: Grade 1 Wound Margin: Distinct, outline attached Exudate Amount: Medium Exudate Type: Serosanguineous Exudate Color: red, brown Foul Odor After Cleansing: No Slough/Fibrino No Wound Bed Granulation Amount: Large (67-100%) Exposed Structure Granulation Quality: Red, Pink Fascia Exposed: No Necrotic Amount: None Present (0%) Fat Layer (Subcutaneous Tissue) Exposed: Yes Tendon Exposed: No Muscle Exposed: No Joint Exposed: No Bone Exposed: No Electronic Signature(s) Signed: 10/25/2021 5:21:41 PM  By: Baruch Gouty RN, BSN Signed: 10/25/2021 5:26:07 PM By: Deon Pilling RN, BSN Entered By: Deon Pilling on 10/25/2021 07:56:32 -------------------------------------------------------------------------------- Vitals Details Patient Name: Date of Service: Wyatt Son D. 10/25/2021 7:30 A M Medical Record Number: [id number] Patient Account Number: [id number] Date of Birth/Sex: Treating RN: 1961-01-28 (61 y.o. Wyatt Meyer Primary Care  Rilea Arutyunyan: Dimas Chyle Other Clinician: Referring Illona Bulman: Treating Franko Hilliker/Extender: Lebron Quam in Treatment: 3 Vital Signs Time Taken: 07:52 Temperature (F): 98.4 Pulse (bpm): 89 Respiratory Rate (breaths/min): 18 Blood Pressure (mmHg): 180/94 Capillary Blood Glucose (mg/dl): 187 Reference Range: 80 - 120 mg / dl Notes glucose per pt report last night Electronic Signature(s) Signed: 10/25/2021 5:21:41 PM By: Baruch Gouty RN, BSN Entered By: Baruch Gouty on 10/25/2021 07:52:42

## 2021-10-25 NOTE — Progress Notes (Addendum)
JAQUANN, GUARISCO ([id number]) Visit Report for 10/25/2021 Chief Complaint Document Details Patient Name: Date of Service: Wyatt Meyer, Wyatt D. 10/25/2021 7:30 A M Medical Record Number: [id number] Patient Account Number: [id number] Date of Birth/Sex: Treating RN: 1961-03-02 (61 y.o. Wyatt Meyer Primary Care Provider: Dimas Chyle Other Clinician: Referring Provider: Treating Provider/Extender: Lebron Quam in Treatment: 3 Information Obtained from: Patient Chief Complaint Left foot and right 1st toe ulcers Electronic Signature(s) Signed: 10/25/2021 8:08:50 AM By: Worthy Keeler PA-C Entered By: Worthy Keeler on 10/25/2021 08:08:50 -------------------------------------------------------------------------------- HPI Details Patient Name: Date of Service: Wyatt Son D. 10/25/2021 7:30 A M Medical Record Number: [id number] Patient Account Number: [id number] Date of Birth/Sex: Treating RN: 1960/10/02 (61 y.o. Wyatt Meyer Primary Care Provider: Dimas Chyle Other Clinician: Referring Provider: Treating Provider/Extender: Lebron Quam in Treatment: 3 History of Present Illness HPI Description: 10/04/2021 on evaluation today patient presents for initial inspection here in the clinic concerning issues that he has been having with the plantar aspect of his feet bilaterally. Subsequently this seems to be in relation to the chemotherapy that he has been undergoing, Xeloda. Subsequently the patient had what appears to be as best I can tell 5 cycles I believe and then was placed on hold on 09/28/2021 due to the issues with the skin on the bottom of his feet. He has a history of prostate cancer status post prostatectomy. He also has peripheral neuropathy, diabetes mellitus type 2, iron deficiency anemia secondary to the colon cancer and bleeding that was associated. 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 Upon further review it actually appears he began cycle 6 of the Xeloda on 09/27/2021. And therefore was placed on hold. He has been using Neosporin to the ulcerated areas. He is supposed to see Dr. Malachy Mood back next week in the interim he was seeing [place] for further evaluation of these areas to see if there is anything we can do to speed up the healing. The patient did have in August 2022 a: Resection with hemicolectomy. 1/18; This patient is a apparently had hand-foot syndrome secondary to Xeloda. This livedo has actually been on for 2 weeks. He had dry scaly skin on his hands that is improving. At 1 point he had an area on the right lateral foot this is healed. He has a small remaining area on the plantar aspect of the left first toe this is just about closed. There is extension of the left midfoot just below the heads he has an open area but even this looks like it is epithelializing medially. We ordered Xeroform for him last week. For some reason this did not come through and he simply been using topical antibiotics. 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 He has been tolerating the dressing changes without complication. Fortunately I think that he is actually making excellent progress at this time his right foot is completely healed the left foot though not completely healed appears to be doing much better. No fevers, chills, nausea, vomiting, or diarrhea. 10/25/2021 upon evaluation patient's wound is showing signs of significant improvement in overall I am extremely pleased with what we see today. I think the patient is still continue to move in the right direction his foot hopefully  will heal shortly and is very close already. Electronic Signature(s) Signed: 10/25/2021 9:40:09 AM By: Worthy Keeler PA-C Entered By: Worthy Keeler on 10/25/2021  09:40:09 -------------------------------------------------------------------------------- Physical Exam Details Patient Name: Date of Service: Wyatt Son D. 10/25/2021 7:30 A M Medical Record Number: [id number] Patient Account Number: [id number] Date of Birth/Sex: Treating RN: [birth year] (61 y.o. Wyatt Meyer Primary Care Provider: Dimas Chyle Other Clinician: Referring Provider: Treating Provider/Extender: Welford Roche Weeks in Treatment: 3 Constitutional Well-nourished and well-hydrated in no acute distress. Respiratory normal breathing without difficulty. Psychiatric this patient is able to make decisions and demonstrates good insight into disease process. Alert and Oriented x 3. pleasant and cooperative. Notes Upon inspection patient's wound bed showed signs of good granulation and epithelization at this point. Fortunately I do not see any evidence of infection locally nor systemically at this time which is great news and overall I am pleased with where things are. Electronic Signature(s) Signed: 10/25/2021 9:40:46 AM By: Worthy Keeler PA-C Entered By: Worthy Keeler on 10/25/2021 09:40:46 -------------------------------------------------------------------------------- Physician Orders Details Patient Name: Date of Service: Wyatt Son D. 10/25/2021 7:30 A M Medical Record Number: [id number] Patient Account Number: [id number] Date of Birth/Sex: Treating RN: Mar 07, 1961 (61 y.o. 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 - with Margarita Grizzle Return A Other: - Urea Cream 40% any thick harden callous areas can apply daily at night. Bathing/ Shower/ Hygiene May shower and wash wound with soap and water. Off-Loading Wedge shoe to: - Front off-loading shoe to left foot Additional Orders / Instructions Follow Nutritious Diet - Monitor/Control Blood Sugar Wound Treatment Wound #1 - Foot Wound Laterality: Plantar, Left Cleanser: Soap and Water 1 x Per Day/30 Days Discharge Instructions: May shower and wash wound with dial antibacterial soap and water prior to dressing change. Prim Dressing: Hydrofera Blue Ready Foam, 2.5 x2.5 in 1 x Per Day/30 Days ary Discharge Instructions: Apply to wound bed as instructed Secondary Dressing: Woven Gauze Sponge, Non-Sterile 4x4 in (Generic) 1 x Per Day/30 Days Discharge Instructions: Apply over primary dressing as directed. Secured With: Child psychotherapist, Sterile 2x75 (in/in) (Generic) 1 x Per Day/30 Days Discharge Instructions: Secure with stretch gauze as directed. Secured With: Paper Tape, 2x10 (in/yd) (Generic) 1 x Per Day/30 Days Discharge Instructions: Secure dressing with tape as directed. Electronic Signature(s) Signed: 10/25/2021 3:56:32 PM By: Worthy Keeler PA-C Signed: 10/25/2021 5:21:41 PM By: Baruch Gouty RN, BSN Entered By: Baruch Gouty on 10/25/2021 08:15:31 -------------------------------------------------------------------------------- Problem List Details Patient Name: Date of Service: Wyatt Son D. 10/25/2021 7:30 A M Medical Record Number: [id number] Patient Account Number: [id number] Date of Birth/Sex: Treating RN: 1961/07/10 (61 y.o. 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 10/25/2021 7:30 A M Medical Record Number: [id number] Patient Account Number: [id number] Date of Birth/Sex: Treating RN: [birth year] (61 y.o. Wyatt Meyer Primary Care Provider: Dimas Chyle Other Clinician: Referring Provider: Treating Provider/Extender: Lebron Quam in Treatment: 3 Subjective Chief Complaint Information obtained from Patient Left foot and right 1st toe ulcers History of Present Illness (HPI) 10/04/2021 on evaluation today patient presents for initial inspection here in the clinic concerning issues that he has been having with the plantar aspect of his feet bilaterally. Subsequently this seems to be in relation to the chemotherapy that he has been undergoing, Xeloda. Subsequently the patient had what appears to be as best I can tell 5 cycles I believe and then was placed on hold on 09/28/2021 due to the issues with the skin on  the bottom of his feet. He has a history of prostate cancer status post prostatectomy. He also has peripheral neuropathy, diabetes mellitus type 2, iron deficiency anemia secondary to the colon cancer and bleeding that was associated. 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 Upon further review it actually appears he began cycle 6 of the Xeloda on 09/27/2021. And therefore was placed on hold. He has been using Neosporin to the ulcerated areas. He is supposed to see Dr. Malachy Mood back next week in the interim he was seeing [place] for further evaluation of these areas to see if there is anything we can do to speed up the healing. The patient did have in August 2022 a: Resection with hemicolectomy. 1/18; This patient is a apparently had hand-foot syndrome secondary to Xeloda. This livedo has actually been on for 2 weeks. He had dry scaly skin on his hands that is improving. At 1 point he had an area on the right lateral foot this is healed. He has a small remaining area on the plantar aspect of the left first toe this is just about closed. There is extension of the left midfoot just below the heads he has an open area but even this looks like it is epithelializing medially. We ordered Xeroform for him last week. For some reason this did not come through and he simply been using topical antibiotics. 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 He has been tolerating the dressing changes without complication. Fortunately I think that he is actually making excellent progress at this time his right foot is completely healed the left foot though not completely healed appears to be doing much better. No fevers, chills, nausea, vomiting, or diarrhea. 10/25/2021 upon evaluation patient's wound is showing signs of significant improvement in overall I am  extremely pleased with  what we see today. I think the patient is still continue to move in the right direction his foot hopefully will heal shortly and is very close already. Objective Constitutional Well-nourished and well-hydrated in no acute distress. Vitals Time Taken: 7:52 AM, Temperature: 98.4 F, Pulse: 89 bpm, Respiratory Rate: 18 breaths/min, Blood Pressure: 180/94 mmHg, Capillary Blood Glucose: 187 mg/dl. General Notes: glucose per pt report last night Respiratory normal breathing without difficulty. Psychiatric this patient is able to make decisions and demonstrates good insight into disease process. Alert and Oriented x 3. pleasant and cooperative. General Notes: Upon inspection patient's wound bed showed signs of good granulation and epithelization at this point. Fortunately I do not see any evidence of infection locally nor systemically at this time which is great news and overall I am pleased with where things are. Integumentary (Hair, Skin) Wound #1 status is Open. Original cause of wound was Gradually Appeared. The date acquired was: 09/24/2021. The wound has been in treatment 3 weeks. The wound is located on the [place]. The wound measures 0.3cm length x 0.7cm width x 0.1cm depth; 0.165cm^2 area and 0.016cm^3 volume. There is Fat Layer (Subcutaneous Tissue) exposed. There is no tunneling or undermining noted. There is a medium amount of serosanguineous drainage noted. The wound margin is distinct with the outline attached to the wound base. There is large (67-100%) red, pink granulation within the wound bed. There is no necrotic tissue within the wound bed. Assessment Active Problems ICD-10 Type 2 diabetes mellitus with foot ulcer Non-pressure chronic ulcer of other part of left foot with fat layer exposed Non-pressure chronic ulcer of other part of right foot with fat layer exposed Malignant neoplasm of overlapping sites of colon Acquired absence of other  specified parts of digestive tract Anemia in neoplastic disease Corns and callosities Plan Follow-up Appointments: Return Appointment in 1 week. - with Margarita Grizzle Other: - Urea Cream 40% any thick harden callous areas can apply daily at night. Bathing/ Shower/ Hygiene: May shower and wash wound with soap and water. Off-Loading: Wedge shoe to: - Front off-loading shoe to left foot Additional Orders / Instructions: Follow Nutritious Diet - Monitor/Control Blood Sugar WOUND #1: - Foot Wound Laterality: Plantar, Left Cleanser: Soap and Water 1 x Per Day/30 Days Discharge Instructions: May shower and wash wound with dial antibacterial soap and water prior to dressing change. Prim Dressing: Hydrofera Blue Ready Foam, 2.5 x2.5 in 1 x Per Day/30 Days ary Discharge Instructions: Apply to wound bed as instructed Secondary Dressing: Woven Gauze Sponge, Non-Sterile 4x4 in (Generic) 1 x Per Day/30 Days Discharge Instructions: Apply over primary dressing as directed. Secured With: Child psychotherapist, Sterile 2x75 (in/in) (Generic) 1 x Per Day/30 Days Discharge Instructions: Secure with stretch gauze as directed. Secured With: Paper T ape, 2x10 (in/yd) (Generic) 1 x Per Day/30 Days Discharge Instructions: Secure dressing with tape as directed. 1. Would recommend him going to continue with the wound care measures as before and the patient is in agreement with plan. This includes the use of the [facility] which I do believe is doing an awesome job. 2. I am also can recommend that we have the patient continue with the roll gauze to secure in place and I think he is also using Covan in order to keep this from moving around on them. 3. I am also can recommend he continue to use moisturizer lotion especially on his hands but also in his feet to try to help keep from breaking down  as far as any skin is concerned. We will see patient back for reevaluation in 1 week here in the clinic. If anything  worsens or changes patient will contact our office for additional recommendations. Electronic Signature(s) Signed: 10/25/2021 9:41:27 AM By: Worthy Keeler PA-C Entered By: Worthy Keeler on 10/25/2021 09:41:26 -------------------------------------------------------------------------------- SuperBill Details Patient Name: Date of Service: Wyatt Son D. 10/25/2021 Medical Record Number: [id number] Patient Account Number: [id number] Date of Birth/Sex: Treating RN: 02/28/1961 (61 y.o. 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 L84 Corns and callosities Facility Procedures CPT4 Code: [id number] Description: 99213 - WOUND CARE VISIT-LEV 3 EST PT Modifier: Quantity: 1 Physician Procedures : CPT4 Code Description Modifier [id number] - WC PHYS LEVEL 3 - EST PT ICD-10 Diagnosis Description E11.621 Type 2 diabetes mellitus with foot ulcer L97.522 Non-pressure chronic ulcer of other part of left foot with fat layer exposed L97.512  Non-pressure chronic ulcer of other part of right foot with fat layer exposed C18.8 Malignant neoplasm of overlapping sites of colon Quantity: 1 Electronic Signature(s) Signed: 10/25/2021 9:41:53 AM By: Worthy Keeler PA-C Entered By: Worthy Keeler on 10/25/2021 09:41:53

## 2021-11-01 ENCOUNTER — Other Ambulatory Visit: Payer: Self-pay

## 2021-11-01 ENCOUNTER — Encounter (HOSPITAL_BASED_OUTPATIENT_CLINIC_OR_DEPARTMENT_OTHER): Payer: BC Managed Care – PPO | Admitting: Physician Assistant

## 2021-11-01 DIAGNOSIS — E11621 Type 2 diabetes mellitus with foot ulcer: Secondary | ICD-10-CM | POA: Diagnosis not present

## 2021-11-01 NOTE — Progress Notes (Signed)
SHONDELL, POULSON ([id number]) Visit Report for 11/01/2021 Arrival Information Details Patient Name: Date of Service: Wyatt Meyer, Wyatt Meyer [place] D. 11/01/2021 7:30 A M Medical Record Number: [id number] Patient Account Number: [id number] Date of Birth/Sex: Treating RN: [birth year] (61 y.o. Wyatt Meyer, Meta.Reding Primary Care Zakari Couchman: Dimas Chyle Other Clinician: Referring Taishawn Smaldone: Treating Aleeta Schmaltz/Extender: Lebron Quam in Treatment: 4 Visit Information History Since Last Visit Added or deleted any medications: No Patient Arrived: Ambulatory Any new allergies or adverse reactions: No Arrival Time: 07:49 Had a fall or experienced change in No Accompanied By: sisters activities of daily living that may affect Transfer Assistance: None risk of falls: Patient Identification Verified: Yes Signs or symptoms of abuse/neglect since last visito No Secondary Verification Process Completed: Yes Hospitalized since last visit: No Patient Requires Transmission-Based Precautions: No Implantable device outside of the clinic excluding No Patient Has Alerts: No cellular tissue based products placed in the center since last visit: Has Dressing in Place as Prescribed: Yes Pain Present Now: No Electronic Signature(s) Signed: 11/01/2021 2:07:25 PM By: Sandre Kitty Entered By: Sandre Kitty on 11/01/2021 07:49:15 -------------------------------------------------------------------------------- Encounter Discharge Information Details Patient Name: Date of Service: Wyatt Son D. 11/01/2021 7:30 A M Medical Record Number: [id number] Patient Account Number: [id number] Date of Birth/Sex: Treating RN: 1961/09/07 (61 y.o. Wyatt Meyer Primary Care Ronell Boldin: Dimas Chyle Other Clinician: Referring Henritta Mutz: Treating Myrel Rappleye/Extender: Lebron Quam in Treatment: 4 Encounter Discharge Information Items Discharge Condition: Stable Ambulatory Status:  Ambulatory Discharge Destination: Home Transportation: Private Auto Accompanied By: family Schedule Follow-up Appointment: No Clinical Summary of Care: Notes foam border applied. wedge shoe left foot. Electronic Signature(s) Signed: 11/01/2021 5:26:44 PM By: Deon Pilling RN, BSN Entered By: Deon Pilling on 11/01/2021 08:14:23 -------------------------------------------------------------------------------- Lower Extremity Assessment Details Patient Name: Date of Service: Wyatt Son D. 11/01/2021 7:30 A M Medical Record Number: [id number] Patient Account Number: [id number] Date of Birth/Sex: Treating RN: Sep 12, 1961 (61 y.o. Wyatt Meyer Primary Care Marielle Mantione: Dimas Chyle Other Clinician: Referring Jebediah Macrae: Treating Jeff Mccallum/Extender: Welford Roche Weeks in Treatment: 4 Edema Assessment Assessed: [Left: Yes] [Right: No] Edema: [Left: N] [Right: o] Calf Left: Right: Point of Measurement: From Medial Instep 38 cm Ankle Left: Right: Point of Measurement: From Medial Instep 23 cm Vascular Assessment Pulses: Dorsalis Pedis Palpable: [Left:Yes] Electronic Signature(s) Signed: 11/01/2021 5:26:44 PM By: Deon Pilling RN, BSN Entered By: Deon Pilling on 11/01/2021 07:54:21 -------------------------------------------------------------------------------- [place] Details Patient Name: Date of Service: Wyatt Son D. 11/01/2021 7:30 A M Medical Record Number: [id number] Patient Account Number: [id number] Date of Birth/Sex: Treating RN: 1961/06/14 (61 y.o. Wyatt Meyer Primary Care Malasia Torain: Dimas Chyle Other Clinician: Referring Sol Odor: Treating Naviah Belfield/Extender: Lebron Quam in Treatment: 4 Multidisciplinary Care Plan reviewed with physician Active Inactive Electronic Signature(s) Signed: 11/01/2021 5:26:44 PM By: Deon Pilling RN, BSN Entered By: Deon Pilling on 11/01/2021  08:14:49 -------------------------------------------------------------------------------- Pain Assessment Details Patient Name: Date of Service: Wyatt Son D. 11/01/2021 7:30 A M Medical Record Number: [id number] Patient Account Number: [id number] Date of Birth/Sex: Treating RN: 1961/09/03 (61 y.o. Wyatt Meyer Primary Care Nevia Henkin: Dimas Chyle Other Clinician: Referring Lorrane Mccay: Treating Nabila Albarracin/Extender: Welford Roche Weeks in Treatment: 4 Active Problems Location of Pain Severity and Description of Pain Patient Has Paino No Site Locations Pain Management and Medication Current Pain Management: Electronic Signature(s) Signed: 11/01/2021 2:07:25 PM By: Sandre Kitty Signed: 11/01/2021 5:26:44 PM By: Rolin Barry  Tammi Klippel RN, BSN Entered By: Sandre Kitty on 11/01/2021 07:49:58 -------------------------------------------------------------------------------- Patient/Caregiver Education Details Patient Name: Date of Service: Wyatt Meyer, Wyatt Meyer 2/8/2023andnbsp7:30 [place] Record Number: [id number] Patient Account Number: [id number] Date of Birth/Gender: Treating RN: 10-03-1960 (61 y.o. Wyatt Meyer Primary Care Physician: Dimas Chyle Other Clinician: Referring Physician: Treating Physician/Extender: Lebron Quam in Treatment: 4 Education Assessment Education Provided To: Patient Education Topics Provided Elevated Blood Sugar/ Impact on Healing: Handouts: Elevated Blood Sugars: How Do They Affect Wound Healing Methods: Explain/Verbal Responses: Reinforcements needed Electronic Signature(s) Signed: 11/01/2021 5:26:44 PM By: Deon Pilling RN, BSN Signed: 11/01/2021 5:26:44 PM By: Deon Pilling RN, BSN Entered By: Deon Pilling on 11/01/2021 07:57:48 -------------------------------------------------------------------------------- Wound Assessment Details Patient Name: Date of Service: Wyatt Son D. 11/01/2021  7:30 A M Medical Record Number: [id number] Patient Account Number: [id number] Date of Birth/Sex: Treating RN: 1961-02-19 (61 y.o. Wyatt Meyer Primary Care Cannie Muckle: Dimas Chyle Other Clinician: Referring Odyn Turko: Treating Gerrard Crystal/Extender: Welford Roche Weeks in Treatment: 4 Wound Status Wound Number: 1 Primary Etiology: Diabetic Wound/Ulcer of the Lower Extremity Wound Location: Left, Plantar Foot Secondary Medication Related Etiology: Wounding Event: Gradually Appeared Wound Status: Healed - Epithelialized Date Acquired: 09/24/2021 Comorbid Anemia, Type II Diabetes, Neuropathy, Received Weeks Of Treatment: 4 History: Chemotherapy Clustered Wound: No Photos Wound Measurements Length: (cm) Width: (cm) Depth: (cm) Area: (cm) Volume: (cm) 0 % Reduction in Area: 100% 0 % Reduction in Volume: 100% 0 Epithelialization: Large (67-100%) 0 Tunneling: No 0 Undermining: No Wound Description Classification: Grade 1 Wound Margin: Distinct, outline attached Exudate Amount: None Present Foul Odor After Cleansing: No Slough/Fibrino No Wound Bed Granulation Amount: None Present (0%) Exposed Structure Necrotic Amount: None Present (0%) Fascia Exposed: No Fat Layer (Subcutaneous Tissue) Exposed: No Tendon Exposed: No Muscle Exposed: No Joint Exposed: No Bone Exposed: No Electronic Signature(s) Signed: 11/01/2021 5:26:44 PM By: Deon Pilling RN, BSN Entered By: Deon Pilling on 11/01/2021 08:08:18 -------------------------------------------------------------------------------- [place] Details Patient Name: Date of Service: Wyatt Son D. 11/01/2021 7:30 A M Medical Record Number: [id number] Patient Account Number: [id number] Date of Birth/Sex: Treating RN: December 21, 1960 (61 y.o. Wyatt Meyer Primary Care Atziry Baranski: Dimas Chyle Other Clinician: Referring Gabrien Mentink: Treating Devon Kingdon/Extender: Welford Roche Weeks in Treatment:  4 Vital Signs Time Taken: 07:49 Temperature (F): 98.5 Pulse (bpm): 81 Respiratory Rate (breaths/min): 18 Blood Pressure (mmHg): 157/83 Reference Range: 80 - 120 mg / dl Electronic Signature(s) Signed: 11/01/2021 2:07:25 PM By: Sandre Kitty Entered By: Sandre Kitty on 11/01/2021 07:49:51

## 2021-11-01 NOTE — Progress Notes (Addendum)
ARYON, NHAM ([id number]) Visit Report for 11/01/2021 Chief Complaint Document Details Patient Name: Date of Service: UNIQUE, SEARFOSS D. 11/01/2021 7:30 A M Medical Record Number: [id number] Patient Account Number: [id number] Date of Birth/Sex: Treating RN: December 13, 1960 (61 y.o. Wyatt Meyer Primary Care Provider: Dimas Chyle Other Clinician: Referring Provider: Treating Provider/Extender: Lebron Quam in Treatment: 4 Information Obtained from: Patient Chief Complaint Left foot and right 1st toe ulcers Electronic Signature(s) Signed: 11/01/2021 8:06:01 AM By: Worthy Keeler PA-C Entered By: Worthy Keeler on 11/01/2021 08:06:01 -------------------------------------------------------------------------------- HPI Details Patient Name: Date of Service: Wyatt Son D. 11/01/2021 7:30 A M Medical Record Number: [id number] Patient Account Number: [id number] Date of Birth/Sex: Treating RN: 1961-04-21 (61 y.o. Wyatt Meyer Primary Care Provider: Dimas Chyle Other Clinician: Referring Provider: Treating Provider/Extender: Lebron Quam in Treatment: 4 History of Present Illness HPI Description: 10/04/2021 on evaluation today patient presents for initial inspection here in the clinic concerning issues that he has been having with the plantar aspect of his feet bilaterally. Subsequently this seems to be in relation to the chemotherapy that he has been undergoing, Xeloda. Subsequently the patient had what appears to be as best I can tell 5 cycles I believe and then was placed on hold on 09/28/2021 due to the issues with the skin on the bottom of his feet. He has a history of prostate cancer status post prostatectomy. He also has peripheral neuropathy, diabetes mellitus type 2, iron deficiency anemia secondary to the colon cancer and bleeding that was associated. 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 Upon further review it actually appears he began cycle 6 of the Xeloda on 09/27/2021. And therefore was placed on hold. He has been using Neosporin to the ulcerated areas. He is supposed to see Dr. Malachy Mood back next week in the interim he was seeing [place] for further evaluation of these areas to see if there is anything we can do to speed up the healing. The patient did have in August 2022 a: Resection with hemicolectomy. 1/18; This patient is a apparently had hand-foot syndrome secondary to Xeloda. This livedo has actually been on for 2 weeks. He had dry scaly skin on his hands that is improving. At 1 point he had an area on the right lateral foot this is healed. He has a small remaining area on the plantar aspect of the left first toe this is just about closed. There is extension of the left midfoot just below the heads he has an open area but even this looks like it is epithelializing medially. We ordered Xeroform for him last week. For some reason this did not come through and he simply been using topical antibiotics. 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 He has been tolerating the dressing changes without complication. Fortunately I think that he is actually making excellent progress at this time his right foot is completely healed the left foot though not completely healed appears to be doing much better. No fevers, chills, nausea, vomiting, or diarrhea. 10/25/2021 upon evaluation patient's wound is showing signs of significant improvement in overall I am extremely pleased with what we see today. I think the patient is still continue to move in the right direction his foot hopefully  will heal shortly and is very close already. 11/01/2021 upon evaluation today patient appears to be doing well with regard to his wound. Fortunately he appears to be healed based on what I am  seeing currently. I do not see any signs of active infection at this time which is great news and overall I feel like he is headed in the right direction. No fevers, chills, nausea, vomiting, or diarrhea. Electronic Signature(s) Signed: 11/01/2021 8:16:25 AM By: Worthy Keeler PA-C Signed: 11/01/2021 8:16:25 AM By: Worthy Keeler PA-C Entered By: Worthy Keeler on 11/01/2021 08:16:25 -------------------------------------------------------------------------------- Paring/cutting of benign hyperkeratotic lesion 2-4 Details Patient Name: Date of Service: Wyatt Meyer, Wyatt D. 11/01/2021 7:30 A M Medical Record Number: [id number] Patient Account Number: [id number] Date of Birth/Sex: Treating RN: 1961/05/16 (61 y.o. Wyatt Meyer Primary Care Provider: Dimas Chyle Other Clinician: Referring Provider: Treating Provider/Extender: Lebron Quam in Treatment: 4 Procedure Performed for: Non-Wound Location Performed By: Physician Worthy Keeler, PA Post Procedure Diagnosis Same as Pre-procedure Notes size 3 curette used to paring the callous. Electronic Signature(s) Signed: 11/01/2021 5:11:08 PM By: Worthy Keeler PA-C Signed: 11/01/2021 5:26:44 PM By: Deon Pilling RN, BSN Entered By: Deon Pilling on 11/01/2021 08:10:44 -------------------------------------------------------------------------------- Physical Exam Details Patient Name: Date of Service: Wyatt Son D. 11/01/2021 7:30 A M Medical Record Number: [id number] Patient Account Number: [id number] Date of Birth/Sex: Treating RN: January 03, 1961 (61 y.o. Wyatt Meyer Primary Care Provider: Dimas Chyle Other Clinician: Referring Provider: Treating Provider/Extender: Welford Roche Weeks in Treatment: 4 Constitutional Well-nourished and well-hydrated in no acute distress. Respiratory normal breathing without difficulty. Psychiatric this patient is able to make decisions and demonstrates  good insight into disease process. Alert and Oriented x 3. pleasant and cooperative. Notes Upon inspection patient's wound bed showed signs of good granulation epithelization at this point. Fortunately I do not see any signs of infection at this time and I do feel like that he is very close to complete resolution at this time based on what I am seeing. Electronic Signature(s) Signed: 11/01/2021 8:16:59 AM By: Worthy Keeler PA-C Entered By: Worthy Keeler on 11/01/2021 08:16:59 -------------------------------------------------------------------------------- Physician Orders Details Patient Name: Date of Service: Wyatt Son D. 11/01/2021 7:30 A M Medical Record Number: [id number] Patient Account Number: [id number] Date of Birth/Sex: Treating RN: 07-27-1961 (61 y.o. 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 Pad the area protection and wear wedge shoe x2 weeks. closely monitor feet daily. Electronic Signature(s) Signed: 11/01/2021 5:11:08 PM By: Worthy Keeler PA-C Signed: 11/01/2021 5:26:44 PM By: Deon Pilling RN, BSN Entered By: Deon Pilling on 11/01/2021 08:12:03 -------------------------------------------------------------------------------- Problem List Details Patient  Name: Date of Service: Wyatt Son D. 11/01/2021 7:30 A M Medical Record Number: [id number] Patient Account Number: [id number] Date of Birth/Sex: Treating RN: 05/20/1961 (60  y.o. 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 11/01/2021 7:30 A M Medical Record Number: [id number] Patient Account Number: [id number] Date of Birth/Sex: Treating RN: 1961-01-30 (60 y.o. Wyatt Meyer Primary Care Provider: Dimas Chyle Other Clinician: Referring Provider: Treating Provider/Extender: Lebron Quam in Treatment: 4 Subjective Chief Complaint Information obtained from Patient Left foot and right 1st toe ulcers History of Present Illness (HPI) 10/04/2021 on evaluation today patient presents for initial inspection here in the clinic concerning issues that he has been having with the plantar aspect of his feet bilaterally. Subsequently  this seems to be in relation to the chemotherapy that he has been undergoing, Xeloda. Subsequently the patient had what appears to be as best I can tell 5 cycles I believe and then was placed on hold on 09/28/2021 due to the issues with the skin on the bottom of his feet. He has a history of prostate cancer status post prostatectomy. He also has peripheral neuropathy, diabetes mellitus type 2, iron deficiency anemia secondary to the colon cancer and bleeding that was associated. 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 Upon further review it actually appears he began cycle 6 of the Xeloda on 09/27/2021. And therefore was placed on hold. He has been using Neosporin to the ulcerated areas. He is supposed to see Dr. Malachy Mood back next week in the interim he was seeing [place] for further evaluation of these areas to see if there is anything we can do to speed up the healing. The patient did have in August 2022 a: Resection with hemicolectomy. 1/18; This patient is a apparently had hand-foot syndrome secondary to Xeloda. This livedo has actually been on for 2 weeks. He had dry scaly skin on his hands that is improving. At 1 point he had an area on the right lateral foot this is healed. He has a small remaining area on the plantar aspect of the left first toe this is just about closed. There is extension of the left midfoot just below the heads he has an open area but even this looks like it is epithelializing medially. We ordered Xeroform for him last week. For some reason this did not come through and he simply been using topical antibiotics. 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 He has been tolerating the dressing changes without complication. Fortunately I think that he is actually making excellent progress at this time his right foot is  completely  healed the left foot though not completely healed appears to be doing much better. No fevers, chills, nausea, vomiting, or diarrhea. 10/25/2021 upon evaluation patient's wound is showing signs of significant improvement in overall I am extremely pleased with what we see today. I think the patient is still continue to move in the right direction his foot hopefully will heal shortly and is very close already. 11/01/2021 upon evaluation today patient appears to be doing well with regard to his wound. Fortunately he appears to be healed based on what I am seeing currently. I do not see any signs of active infection at this time which is great news and overall I feel like he is headed in the right direction. No fevers, chills, nausea, vomiting, or diarrhea. Objective Constitutional Well-nourished and well-hydrated in no acute distress. Vitals Time Taken: 7:49 AM, Temperature: 98.5 F, Pulse: 81 bpm, Respiratory Rate: 18 breaths/min, Blood Pressure: 157/83 mmHg. Respiratory normal breathing without difficulty. Psychiatric this patient is able to make decisions and demonstrates good insight into disease process. Alert and Oriented x 3. pleasant and cooperative. General Notes: Upon inspection patient's wound bed showed signs of good granulation epithelization at this point. Fortunately I do not see any signs of infection at this time and I do feel like that he is very close to complete resolution at this time based on what I am seeing. Integumentary (Hair, Skin) Wound #1 status is Healed - Epithelialized. Original cause of wound was Gradually Appeared. The date acquired was: 09/24/2021. The wound has been in treatment 4 weeks. The wound is located on the [place]. The wound measures 0cm length x 0cm width x 0cm depth; 0cm^2 area and 0cm^3 volume. There is no tunneling or undermining noted. There is a none present amount of drainage noted. The wound margin is distinct with the outline  attached to the wound base. There is no granulation within the wound bed. There is no necrotic tissue within the wound bed. 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 by Worthy Keeler, PA. Post procedure Diagnosis Wound #: Same as Pre-Procedure Notes: size 3 curette used to paring the callous. Plan Discharge From [facility]: Discharge from [place] - Call if any future wound care needs. Pad the area protection and wear wedge shoe x2 weeks. closely monitor feet daily. 1. Would recommend currently that we going continue with the wound care measures as before and the patient is in agreement the plan this is simply due to the fact that I want to give him some padding although the wound is healed I still think that he needs to have this padded for a bit longer. I am going to suggest as well that he should be using his front offloading shoe still in order to let this toughen up over the next 2 weeks. 2. I am also can recommend that we have the patient continue to monitor for any signs of worsening or infection if anything changes he should let me know soon as possible. We will see the patient back for follow-up visit as needed. Electronic Signature(s) Signed: 11/01/2021 8:17:44 AM By: Melburn Hake,  Margarita Grizzle PA-C Entered By: Worthy Keeler on 11/01/2021 08:17:43 -------------------------------------------------------------------------------- SuperBill Details Patient Name: Date of Service: Wyatt Meyer, Wyatt D. 11/01/2021 Medical Record Number:  [id number] Patient Account Number: [id number] Date of Birth/Sex: Treating RN: 1961/01/23 (61 y.o. 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

## 2021-11-02 ENCOUNTER — Other Ambulatory Visit (HOSPITAL_COMMUNITY): Payer: Self-pay

## 2021-11-03 ENCOUNTER — Other Ambulatory Visit: Payer: Self-pay

## 2021-11-03 ENCOUNTER — Other Ambulatory Visit (HOSPITAL_COMMUNITY): Payer: Self-pay

## 2021-11-03 ENCOUNTER — Inpatient Hospital Stay: Payer: BC Managed Care – PPO | Attending: Oncology

## 2021-11-03 ENCOUNTER — Inpatient Hospital Stay: Payer: BC Managed Care – PPO | Admitting: Oncology

## 2021-11-03 ENCOUNTER — Encounter: Payer: Self-pay | Admitting: *Deleted

## 2021-11-03 VITALS — BP 137/74 | HR 70 | Temp 98.1°F | Resp 18 | Ht 73.0 in | Wt 242.2 lb

## 2021-11-03 DIAGNOSIS — E119 Type 2 diabetes mellitus without complications: Secondary | ICD-10-CM | POA: Diagnosis not present

## 2021-11-03 DIAGNOSIS — C182 Malignant neoplasm of ascending colon: Secondary | ICD-10-CM | POA: Diagnosis not present

## 2021-11-03 DIAGNOSIS — Z79899 Other long term (current) drug therapy: Secondary | ICD-10-CM | POA: Insufficient documentation

## 2021-11-03 DIAGNOSIS — G629 Polyneuropathy, unspecified: Secondary | ICD-10-CM | POA: Insufficient documentation

## 2021-11-03 DIAGNOSIS — Z9079 Acquired absence of other genital organ(s): Secondary | ICD-10-CM | POA: Diagnosis not present

## 2021-11-03 DIAGNOSIS — D508 Other iron deficiency anemias: Secondary | ICD-10-CM | POA: Diagnosis not present

## 2021-11-03 DIAGNOSIS — Z8546 Personal history of malignant neoplasm of prostate: Secondary | ICD-10-CM | POA: Insufficient documentation

## 2021-11-03 DIAGNOSIS — C180 Malignant neoplasm of cecum: Secondary | ICD-10-CM | POA: Insufficient documentation

## 2021-11-03 LAB — CBC WITH DIFFERENTIAL (CANCER CENTER ONLY)
Abs Immature Granulocytes: 0.01 10*3/uL (ref 0.00–0.07)
Basophils Absolute: 0 10*3/uL (ref 0.0–0.1)
Basophils Relative: 0 %
Eosinophils Absolute: 0.1 10*3/uL (ref 0.0–0.5)
Eosinophils Relative: 2 %
HCT: 35.1 % — ABNORMAL LOW (ref 39.0–52.0)
Hemoglobin: 10.9 g/dL — ABNORMAL LOW (ref 13.0–17.0)
Immature Granulocytes: 0 %
Lymphocytes Relative: 30 %
Lymphs Abs: 1.8 10*3/uL (ref 0.7–4.0)
MCH: 32.2 pg (ref 26.0–34.0)
MCHC: 31.1 g/dL (ref 30.0–36.0)
MCV: 103.8 fL — ABNORMAL HIGH (ref 80.0–100.0)
Monocytes Absolute: 0.7 10*3/uL (ref 0.1–1.0)
Monocytes Relative: 12 %
Neutro Abs: 3.5 10*3/uL (ref 1.7–7.7)
Neutrophils Relative %: 56 %
Platelet Count: 229 10*3/uL (ref 150–400)
RBC: 3.38 MIL/uL — ABNORMAL LOW (ref 4.22–5.81)
RDW: 14.3 % (ref 11.5–15.5)
WBC Count: 6.2 10*3/uL (ref 4.0–10.5)
nRBC: 0 % (ref 0.0–0.2)

## 2021-11-03 LAB — CMP (CANCER CENTER ONLY)
ALT: 24 U/L (ref 0–44)
AST: 25 U/L (ref 15–41)
Albumin: 3.7 g/dL (ref 3.5–5.0)
Alkaline Phosphatase: 109 U/L (ref 38–126)
Anion gap: 7 (ref 5–15)
BUN: 19 mg/dL (ref 8–23)
CO2: 28 mmol/L (ref 22–32)
Calcium: 9 mg/dL (ref 8.9–10.3)
Chloride: 103 mmol/L (ref 98–111)
Creatinine: 1.32 mg/dL — ABNORMAL HIGH (ref 0.61–1.24)
GFR, Estimated: >60 mL/min (ref >60)
Glucose, Bld: 273 mg/dL — ABNORMAL HIGH (ref 70–99)
Potassium: 4.4 mmol/L (ref 3.5–5.1)
Sodium: 138 mmol/L (ref 135–145)
Total Bilirubin: 0.5 mg/dL (ref 0.3–1.2)
Total Protein: 7.4 g/dL (ref 6.5–8.1)

## 2021-11-03 NOTE — Progress Notes (Signed)
Patient reports his co-pay for Xeloda is now $100 due to new year. He states he can't afford this. Confirmed w/managed care and oral chemotherapy program that there is no patient assistance for this. Pharmacy will accept credit card or he can make payments with specialty pharmacy for the medication. He reports he still has about #40 pills on hand from a past fill.  Will call him on 2/13 w/payment options

## 2021-11-03 NOTE — Progress Notes (Signed)
[place] OFFICE PROGRESS NOTE   Diagnosis: Colon cancer  INTERVAL HISTORY:   Wyatt Meyer returns as scheduled.  He began another cycle Xeloda 10/16/2021.  No mouth sores, nausea, or diarrhea.  The foot ulcers have almost completely healed.  No pain.  Objective:  Vital signs in last 24 hours:  Blood pressure 137/74, pulse 70, temperature 98.1 F (36.7 C), temperature source Oral, resp. rate 18, height _0  (1.854 m), weight 242 lb 3.2 oz (109.9 kg), SpO2 100 %.    HEENT: No thrush or ulcers Resp: Lungs clear bilaterally Cardio: Regular rate and rhythm GI: No hepatosplenomegaly Vascular: No leg edema  Skin: Hyperpigmentation and skin thickening at the palms, hyperpigmentation and callus formation at the soles.  Linear ulcerations have almost completely healed with a small remaining opening at the left sole   Lab Results:  Lab Results  Component Value Date   WBC 6.2 11/03/2021   HGB 10.9 (L) 11/03/2021   HCT 35.1 (L) 11/03/2021   MCV 103.8 (H) 11/03/2021   PLT 229 11/03/2021   NEUTROABS 3.5 11/03/2021    CMP  Lab Results  Component Value Date   NA 141 09/28/2021   K 4.4 09/28/2021   CL 106 09/28/2021   CO2 28 09/28/2021   GLUCOSE 130 (H) 09/28/2021   BUN 19 09/28/2021   CREATININE 1.30 (H) 09/28/2021   CALCIUM 9.0 09/28/2021   PROT 7.7 09/28/2021   ALBUMIN 3.9 09/28/2021   AST 20 09/28/2021   ALT 19 09/28/2021   ALKPHOS 149 (H) 09/28/2021   BILITOT 0.3 09/28/2021   GFRNONAA >60 09/28/2021   GFRAA 84 04/04/2016    Lab Results  Component Value Date   CEA 4.30 06/05/2021    Lab Results  Component Value Date   INR 1.1 04/18/2021   LABPROT 13.7 04/18/2021    Imaging:  No results found.  Medications: I have reviewed the patient's current medications.   Assessment/Plan: Colon cancer, cecum, stage IIb (T4a N0 M0), status post a laparoscopic right colectomy 05/03/2021 0/19 lymph nodes, no perineural or lymphovascular invasion,  negative resection margins, no tumor deposits, tumor extends into subserosal connective tissue with microscopic involvement of the serosa, MSS, no loss of mismatch repair protein expression CT the abdomen/pelvis 02/03/2021-no bowel obstruction, no adenopathy, probable fatty liver CT chest 02/14/2021-2 mm right middle lobe and 6 mm left upper lobe fissural nodule, nonspecific Elevated preoperative CEA, repeat CEA 06/05/2021-4.3 Guardant Reveal 06/05/2021-negative Cycle 1 adjuvant Xeloda 06/05/2021 Cycle 2 Xeloda 06/26/2021--reported he was still taking Xeloda 07/14/2021, instructed to stop further Xeloda with the plan to resume with cycle 3 on 07/24/2021 Cycle 3 Xeloda 07/24/2021 Cycle 4 Xeloda 08/14/2021-reported he was still taking Xeloda 08/31/2021, instructed to stop further Xeloda with the plan to resume with cycle 5 on 09/08/2021 Guardant reveal 08/31/2021-negative Cycle 5 Xeloda 09/08/2021 Xeloda placed on hold due to hand-foot syndrome 09/28/2021 Cycle 6 Xeloda resumed 10/16/2021, dose reduced to 1000 mg twice daily for 14 days/7 day break Cycle 7 Xeloda 11/06/2021   Iron deficiency anemia secondary to #1 Diabetes Peripheral neuropathy History of prostate cancer, status post prostatectomy 08/29/2012, pT2c, N0, MX, Gleason 7 Hand/foot syndrome-Xeloda placed on hold 09/28/2021     Disposition: Wyatt Meyer appears well.  He tolerated the Xeloda well at the reduced dose.  The foot ulcers have almost completely healed.  He will begin another cycle of Xeloda on 11/06/2021.  He will hold Xeloda and contact us for progression of foot ulcers.  He will  return for an office visit in 3 weeks.  Betsy Coder, MD  11/03/2021  8:13 AM

## 2021-11-06 ENCOUNTER — Telehealth: Payer: Self-pay | Admitting: *Deleted

## 2021-11-06 NOTE — Telephone Encounter (Signed)
Left  message for Wyatt Meyer that are no programs/grant that will cover his $100 co-pay for Xeloda. Options are to reach out to specialty pharmacy with credit card or arrange for payment plan. Provided the specialty pharmacy phone # for him to call.

## 2021-11-10 ENCOUNTER — Other Ambulatory Visit (HOSPITAL_COMMUNITY): Payer: Self-pay

## 2021-11-13 ENCOUNTER — Other Ambulatory Visit (HOSPITAL_COMMUNITY): Payer: Self-pay

## 2021-11-14 ENCOUNTER — Other Ambulatory Visit: Payer: Self-pay

## 2021-11-14 ENCOUNTER — Ambulatory Visit: Payer: BC Managed Care – PPO | Admitting: Endocrinology

## 2021-11-14 VITALS — BP 120/70 | HR 93 | Ht 73.0 in | Wt 245.4 lb

## 2021-11-14 DIAGNOSIS — E1165 Type 2 diabetes mellitus with hyperglycemia: Secondary | ICD-10-CM | POA: Diagnosis not present

## 2021-11-14 LAB — POCT GLYCOSYLATED HEMOGLOBIN (HGB A1C): Hemoglobin A1C: 10.1 % — AB (ref 4.0–5.6)

## 2021-11-14 MED ORDER — LANTUS SOLOSTAR 100 UNIT/ML ~~LOC~~ SOPN: 60.0000 [IU] | PEN_INJECTOR | SUBCUTANEOUS | 3 refills | Status: DC

## 2021-11-14 MED ORDER — FREESTYLE LIBRE 2 READER DEVI: 1.0000 | Freq: Once | 1 refills | Status: AC

## 2021-11-14 NOTE — Patient Instructions (Addendum)
I have sent a prescription to your pharmacy, for the reader.   We will need to take this complex situation in stages.   For now, please change the Novolog to Lantus, 60 units each morning, and you can stay off the metformin.   On this type of insulin schedule, you should eat meals on a regular schedule.  If a meal is missed or significantly delayed, your blood sugar could go low. Please come back for a follow-up appointment in 1 month.

## 2021-11-14 NOTE — Progress Notes (Signed)
Subjective:    Patient ID: Wyatt Meyer, male    DOB: 05/15/1961, 61 y.o.   MRN: [id number]  HPI Pt returns for f/u of diabetes mellitus: DM type: Insulin-requiring type 2 Dx'ed: 4481 Complications: CRI and PN Therapy: insulin since 2017 DKA: never Severe hypoglycemia: never Pancreatitis: never Pancreatic imaging: normal on 2022 CT.   SDOH: therapy has been limited by chronic noncompliance.   Other: he takes multiple daily injections Interval history: he lost his continuous glucose monitor reader.  We downloaded, but there are no data.  He has not recently taken Lantus or metformin.  He takes only SS-Novolog 3-16 units 3 times a day (just before each meal).  He seldom has hypoglycemia, and these episodes are mild.  Cbg varies from 50-500.   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/23/2016   Hyperlipidemia    on meds   Iron deficiency anemia    on IRON and Geritol   Kidney stone    Klebsiella sepsis ([place]) 03/25/2015   Osteoarthritis 08/11/2012   Peripheral neuropathy 08/11/2012   Prostate cancer ([place])    Smoker    SUI (stress urinary incontinence), male 02/03/2013   Uncontrolled diabetes mellitus with complications 8/56/3149   on meds    Past Surgical History:  Procedure Laterality Date   LAPAROSCOPIC RIGHT HEMI COLECTOMY Right 05/03/2021   Procedure: LAPAROSCOPIC RIGHT HEMI COLECTOMY WITH LYSIS OF ADHESIONS AND BILATERAL TAP BLOCK;  Surgeon: Ileana Roup, MD;  Location: [facility];  Service: General;  Laterality: Right;   PROSTATE SURGERY  09/25/2011   PROSTATE SURGERY     WISDOM TOOTH EXTRACTION      Social History   Socioeconomic History   Marital status: Married    Spouse name: Not on file   Number of children: 3   Years of education: Not on file   Highest education level: Not on file  Occupational History   Occupation: Data processing manager  Tobacco Use   Smoking status: Every  Day    Packs/day: 0.50    Types: Cigarettes   Smokeless tobacco: Never   Tobacco comments:    tobacco info given 09/27/2020  Vaping Use   Vaping Use: Never used  Substance and Sexual Activity   Alcohol use: Yes    Comment: 1-2 beers per month   Drug use: No   Sexual activity: Yes  Other Topics Concern   Not on file  Social History Narrative   Not on file   Social Determinants of Health   Financial Resource Strain: Not on file  Food Insecurity: Not on file  Transportation Needs: Not on file  Physical Activity: Not on file  Stress: Not on file  Social Connections: Not on file  Intimate Partner Violence: Not on file    Current Outpatient Medications on File Prior to Visit  Medication Sig Dispense Refill   atorvastatin (LIPITOR) 10 MG tablet Take 1 tablet (10 mg total) by mouth daily. 90 tablet 3   capecitabine (XELODA) 500 MG tablet Take 2 tablets (1,000 mg total) by mouth 2 (two) times daily after a meal. Take for 14 days, then hold for 7 days. Repeat every 21 days. Do not start until 11/06/21 56 tablet 0   Continuous Blood Gluc Sensor (FREESTYLE LIBRE 14 DAY SENSOR) MISC USE EVERY 14 DAYS AS DIRECTED 2 each 4   Insulin Pen Needle (BD PEN NEEDLE NANO U/F) 32G X 4  MM MISC use four times a day 400 each 3   Iron, Ferrous Sulfate, 325 (65 Fe) MG TABS Take 1 tablet by mouth every other day. Take WITH Vitamin C (Patient taking differently: Take 1 tablet by mouth every other day.) 15 tablet 0   ONE TOUCH LANCETS MISC Use 3x a day 200 each 11   Current Facility-Administered Medications on File Prior to Visit  Medication Dose Route Frequency Provider Last Rate Last Admin   0.9 %  sodium chloride infusion  500 mL Intravenous Once Thornton Park, MD        Allergies  Allergen Reactions   Codeine Nausea And Vomiting    Family History  Problem Relation Age of Onset   Hyperlipidemia Mother    Diabetes Mother    Heart disease Father    Hyperlipidemia Sister    Thyroid cancer  Sister    Thyroid cancer Sister    Esophageal cancer Neg Hx    Rectal cancer Neg Hx    Colon cancer Neg Hx    Stomach cancer Neg Hx    Colon polyps Neg Hx     BP 120/70    Pulse 93    Ht 6\' 1"  (1.854 m)    Wt 245 lb 6.4 oz (111.3 kg)    SpO2 98%    BMI 32.38 kg/m    Review of Systems     Objective:   Physical Exam VITAL SIGNS:  See vs page GENERAL: no distress Pulses: dorsalis pedis intact bilat.   MSK: no deformity of the feet CV: no leg edema.   Skin:  no ulcer on the feet.  normal color and temp on the feet.   Neuro: sensation is intact to touch on the feet.     Lab Results  Component Value Date   CREATININE 1.32 (H) 11/03/2021   BUN 19 11/03/2021   NA 138 11/03/2021   K 4.4 11/03/2021   CL 103 11/03/2021   CO2 28 11/03/2021    Lab Results  Component Value Date   HGBA1C 10.1 (A) 11/14/2021       Assessment & Plan:  Insulin-requiring type 2 DM: uncontrolled Hypoglycemia, due to insulin: we need continuous glucose monitor data in order to safely adjust insulin  Patient Instructions  I have sent a prescription to your pharmacy, for the reader.   We will need to take this complex situation in stages.   For now, please change the Novolog to Lantus, 60 units each morning, and you can stay off the metformin.   On this type of insulin schedule, you should eat meals on a regular schedule.  If a meal is missed or significantly delayed, your blood sugar could go low. Please come back for a follow-up appointment in 1 month.

## 2021-11-15 ENCOUNTER — Telehealth: Payer: Self-pay | Admitting: Pharmacy Technician

## 2021-11-15 ENCOUNTER — Encounter: Payer: Self-pay | Admitting: Endocrinology

## 2021-11-15 ENCOUNTER — Other Ambulatory Visit (HOSPITAL_COMMUNITY): Payer: Self-pay

## 2021-11-15 NOTE — Telephone Encounter (Signed)
Patient Advocate Encounter  Received notification from [facility]) that prior authorization for LANTUS SOLOSTAR is required.   PA submitted on 2.22.23 Key [id number] Status is pending   [facility] will continue to follow  Luciano Cutter, CPhT Patient Advocate [facility] Phone: [phone] Fax:  [phone]

## 2021-11-16 ENCOUNTER — Other Ambulatory Visit (HOSPITAL_COMMUNITY): Payer: Self-pay

## 2021-11-16 MED ORDER — BASAGLAR KWIKPEN 100 UNIT/ML ~~LOC~~ SOPN: 60.0000 [IU] | PEN_INJECTOR | SUBCUTANEOUS | 3 refills | Status: DC

## 2021-11-16 NOTE — Telephone Encounter (Signed)
Message sent thru MyChart to notify pt of the change

## 2021-11-17 ENCOUNTER — Other Ambulatory Visit (HOSPITAL_COMMUNITY): Payer: Self-pay

## 2021-11-20 ENCOUNTER — Other Ambulatory Visit (HOSPITAL_COMMUNITY): Payer: Self-pay

## 2021-11-22 ENCOUNTER — Ambulatory Visit (INDEPENDENT_AMBULATORY_CARE_PROVIDER_SITE_OTHER): Payer: BC Managed Care – PPO | Admitting: Endocrinology

## 2021-11-22 ENCOUNTER — Other Ambulatory Visit: Payer: Self-pay

## 2021-11-22 VITALS — BP 120/60 | HR 89 | Ht 73.0 in | Wt 238.2 lb

## 2021-11-22 DIAGNOSIS — Z794 Long term (current) use of insulin: Secondary | ICD-10-CM

## 2021-11-22 DIAGNOSIS — E1165 Type 2 diabetes mellitus with hyperglycemia: Secondary | ICD-10-CM

## 2021-11-22 MED ORDER — FREESTYLE LIBRE 2 SENSOR MISC: 1.0000 | 3 refills | Status: DC

## 2021-11-22 NOTE — Patient Instructions (Addendum)
I have sent a prescription to your pharmacy, for the sensors.     ?Please take the Basaglar, 60 units each morning.  ?On this type of insulin schedule, you should eat meals on a regular schedule.  If a meal is missed or significantly delayed, your blood sugar could go low.  ?Please come back for a follow-up appointment in 1 month.   ?

## 2021-11-22 NOTE — Progress Notes (Signed)
? ?Subjective:  ? ? Patient ID: Wyatt Meyer, male    DOB: [birth year], 61 y.o.   MRN: [id number] ? ?HPI ?Pt returns for f/u of diabetes mellitus: ?DM type: Insulin-requiring type 2 ?Dx'ed: 2015 ?Complications: CRI and PN ?Therapy: insulin since 2017 ?DKA: never ?Severe hypoglycemia: never ?Pancreatitis: never ?Pancreatic imaging: normal on 2022 CT.   ?SDOH: therapy has been limited by chronic noncompliance.   ?Other: he takes QD insulin, after poor results with multiple daily injections ?Interval history: Pt says he does not have continuous glucose monitor sensors. Pt says he does not know why he was on so much basaglar (60/d).  He says cbg's are often over 300.   ?Past Medical History:  ?Diagnosis Date  ? Anemia   ? on meds  ? Erectile dysfunction following radical prostatectomy 10/23/2016  ? Hyperlipidemia   ? on meds  ? Iron deficiency anemia   ? on IRON and Geritol  ? Kidney stone   ? Klebsiella sepsis ([place]) 03/25/2015  ? Osteoarthritis 08/11/2012  ? Peripheral neuropathy 08/11/2012  ? Prostate cancer ([place])   ? Smoker   ? SUI (stress urinary incontinence), male 02/03/2013  ? Uncontrolled diabetes mellitus with complications 9/62/9528  ? on meds  ? ? ?Past Surgical History:  ?Procedure Laterality Date  ? LAPAROSCOPIC RIGHT HEMI COLECTOMY Right 05/03/2021  ? Procedure: LAPAROSCOPIC RIGHT HEMI COLECTOMY WITH LYSIS OF ADHESIONS AND BILATERAL TAP BLOCK;  Surgeon: Ileana Roup, MD;  Location: [facility];  Service: General;  Laterality: Right;  ? PROSTATE SURGERY  09/25/2011  ? PROSTATE SURGERY    ? WISDOM TOOTH EXTRACTION    ? ? ?Social History  ? ?Socioeconomic History  ? Marital status: Married  ?  Spouse name: Not on file  ? Number of children: 3  ? Years of education: Not on file  ? Highest education level: Not on file  ?Occupational History  ? Occupation: Data processing manager  ?Tobacco Use  ? Smoking status: Every Day  ?  Packs/day: 0.50  ?  Types: Cigarettes  ? Smokeless tobacco: Never  ? Tobacco comments:  ?   tobacco info given 09/27/2020  ?Vaping Use  ? Vaping Use: Never used  ?Substance and Sexual Activity  ? Alcohol use: Yes  ?  Comment: 1-2 beers per month  ? Drug use: No  ? Sexual activity: Yes  ?Other Topics Concern  ? Not on file  ?Social History Narrative  ? Not on file  ? ?Social Determinants of Health  ? ?Financial Resource Strain: Not on file  ?Food Insecurity: Not on file  ?Transportation Needs: Not on file  ?Physical Activity: Not on file  ?Stress: Not on file  ?Social Connections: Not on file  ?Intimate Partner Violence: Not on file  ? ? ?Current Outpatient Medications on File Prior to Visit  ?Medication Sig Dispense Refill  ? atorvastatin (LIPITOR) 10 MG tablet Take 1 tablet (10 mg total) by mouth daily. 90 tablet 3  ? capecitabine (XELODA) 500 MG tablet Take 2 tablets (1,000 mg total) by mouth 2 (two) times daily after a meal. Take for 14 days, then hold for 7 days. Repeat every 21 days. Do not start until 11/06/21 56 tablet 0  ? Insulin Glargine (BASAGLAR KWIKPEN) 100 UNIT/ML Inject 60 Units into the skin every morning. 60 mL 3  ? Insulin Pen Needle (BD PEN NEEDLE NANO U/F) 32G X 4 MM MISC use four times a day 400 each 3  ? Iron, Ferrous Sulfate, 325 (65 Fe)  MG TABS Take 1 tablet by mouth every other day. Take WITH Vitamin C (Patient taking differently: Take 1 tablet by mouth every other day.) 15 tablet 0  ? ONE TOUCH LANCETS MISC Use 3x a day 200 each 11  ? ?Current Facility-Administered Medications on File Prior to Visit  ?Medication Dose Route Frequency Provider Last Rate Last Admin  ? 0.9 %  sodium chloride infusion  500 mL Intravenous Once Thornton Park, MD      ? ? ?Allergies  ?Allergen Reactions  ? Codeine Nausea And Vomiting  ? ? ?Family History  ?Problem Relation Age of Onset  ? Hyperlipidemia Mother   ? Diabetes Mother   ? Heart disease Father   ? Hyperlipidemia Sister   ? Thyroid cancer Sister   ? Thyroid cancer Sister   ? Esophageal cancer Neg Hx   ? Rectal cancer Neg Hx   ? Colon cancer Neg  Hx   ? Stomach cancer Neg Hx   ? Colon polyps Neg Hx   ? ? ?BP 120/60   Pulse 89   Ht 6\' 1"  (1.854 m)   Wt 238 lb 3.2 oz (108 kg)   SpO2 98%   BMI 31.43 kg/m?  ? ?Review of Systems ? ?   ?Objective:  ? Physical Exam ? ? ?Lab Results  ?Component Value Date  ? HGBA1C 10.1 (A) 11/14/2021  ? ?   ?Assessment & Plan:  ?Insulin-requiring type 2 DM: uncontrolled.  I explained that 60/d is a lower dosage than when he was on the multiple daily injections.  ? ?Patient Instructions  ?I have sent a prescription to your pharmacy, for the sensors.     ?Please take the Basaglar, 60 units each morning.  ?On this type of insulin schedule, you should eat meals on a regular schedule.  If a meal is missed or significantly delayed, your blood sugar could go low.  ?Please come back for a follow-up appointment in 1 month.   ?  ? ?

## 2021-11-23 ENCOUNTER — Other Ambulatory Visit (HOSPITAL_COMMUNITY): Payer: Self-pay

## 2021-11-24 ENCOUNTER — Inpatient Hospital Stay: Payer: BC Managed Care – PPO | Admitting: Oncology

## 2021-11-24 ENCOUNTER — Inpatient Hospital Stay: Payer: BC Managed Care – PPO | Attending: Oncology

## 2021-11-24 DIAGNOSIS — E119 Type 2 diabetes mellitus without complications: Secondary | ICD-10-CM | POA: Insufficient documentation

## 2021-11-24 DIAGNOSIS — C180 Malignant neoplasm of cecum: Secondary | ICD-10-CM | POA: Insufficient documentation

## 2021-11-24 DIAGNOSIS — D500 Iron deficiency anemia secondary to blood loss (chronic): Secondary | ICD-10-CM | POA: Insufficient documentation

## 2021-11-24 DIAGNOSIS — G629 Polyneuropathy, unspecified: Secondary | ICD-10-CM | POA: Insufficient documentation

## 2021-11-24 DIAGNOSIS — Z8546 Personal history of malignant neoplasm of prostate: Secondary | ICD-10-CM | POA: Insufficient documentation

## 2021-11-27 ENCOUNTER — Other Ambulatory Visit (HOSPITAL_COMMUNITY): Payer: Self-pay

## 2021-11-27 ENCOUNTER — Encounter: Payer: Self-pay | Admitting: *Deleted

## 2021-11-27 MED FILL — Capecitabine Tab 500 MG: ORAL | 14 days supply | Qty: 56 | Fill #0 | Status: AC

## 2021-11-27 NOTE — Progress Notes (Signed)
Wyatt Meyer was "no show" for his lab/OV on 11/24/21. Scheduling message sent to reschedule 1st available. ?

## 2021-12-01 ENCOUNTER — Other Ambulatory Visit: Payer: Self-pay

## 2021-12-01 ENCOUNTER — Encounter: Payer: Self-pay | Admitting: Nurse Practitioner

## 2021-12-01 ENCOUNTER — Inpatient Hospital Stay: Payer: BC Managed Care – PPO

## 2021-12-01 ENCOUNTER — Inpatient Hospital Stay: Payer: BC Managed Care – PPO | Admitting: Nurse Practitioner

## 2021-12-01 VITALS — BP 144/71 | HR 83 | Temp 98.1°F | Resp 18 | Ht 73.0 in | Wt 243.8 lb

## 2021-12-01 DIAGNOSIS — D500 Iron deficiency anemia secondary to blood loss (chronic): Secondary | ICD-10-CM | POA: Diagnosis not present

## 2021-12-01 DIAGNOSIS — C182 Malignant neoplasm of ascending colon: Secondary | ICD-10-CM

## 2021-12-01 DIAGNOSIS — G629 Polyneuropathy, unspecified: Secondary | ICD-10-CM | POA: Diagnosis not present

## 2021-12-01 DIAGNOSIS — C180 Malignant neoplasm of cecum: Secondary | ICD-10-CM | POA: Diagnosis present

## 2021-12-01 DIAGNOSIS — E119 Type 2 diabetes mellitus without complications: Secondary | ICD-10-CM | POA: Diagnosis not present

## 2021-12-01 DIAGNOSIS — Z8546 Personal history of malignant neoplasm of prostate: Secondary | ICD-10-CM | POA: Diagnosis not present

## 2021-12-01 LAB — CMP (CANCER CENTER ONLY)
ALT: 20 U/L (ref 0–44)
AST: 22 U/L (ref 15–41)
Albumin: 3.9 g/dL (ref 3.5–5.0)
Alkaline Phosphatase: 143 U/L — ABNORMAL HIGH (ref 38–126)
Anion gap: 7 (ref 5–15)
BUN: 16 mg/dL (ref 8–23)
CO2: 27 mmol/L (ref 22–32)
Calcium: 9.1 mg/dL (ref 8.9–10.3)
Chloride: 105 mmol/L (ref 98–111)
Creatinine: 1.37 mg/dL — ABNORMAL HIGH (ref 0.61–1.24)
GFR, Estimated: 58 mL/min — ABNORMAL LOW (ref >60)
Glucose, Bld: 198 mg/dL — ABNORMAL HIGH (ref 70–99)
Potassium: 4.2 mmol/L (ref 3.5–5.1)
Sodium: 139 mmol/L (ref 135–145)
Total Bilirubin: 0.3 mg/dL (ref 0.3–1.2)
Total Protein: 7.6 g/dL (ref 6.5–8.1)

## 2021-12-01 LAB — CBC WITH DIFFERENTIAL (CANCER CENTER ONLY)
Abs Immature Granulocytes: 0.01 10*3/uL (ref 0.00–0.07)
Basophils Absolute: 0 10*3/uL (ref 0.0–0.1)
Basophils Relative: 1 %
Eosinophils Absolute: 0.1 10*3/uL (ref 0.0–0.5)
Eosinophils Relative: 1 %
HCT: 38.1 % — ABNORMAL LOW (ref 39.0–52.0)
Hemoglobin: 11.9 g/dL — ABNORMAL LOW (ref 13.0–17.0)
Immature Granulocytes: 0 %
Lymphocytes Relative: 24 %
Lymphs Abs: 1.4 10*3/uL (ref 0.7–4.0)
MCH: 32.3 pg (ref 26.0–34.0)
MCHC: 31.2 g/dL (ref 30.0–36.0)
MCV: 103.5 fL — ABNORMAL HIGH (ref 80.0–100.0)
Monocytes Absolute: 0.7 10*3/uL (ref 0.1–1.0)
Monocytes Relative: 11 %
Neutro Abs: 3.9 10*3/uL (ref 1.7–7.7)
Neutrophils Relative %: 63 %
Platelet Count: 247 10*3/uL (ref 150–400)
RBC: 3.68 MIL/uL — ABNORMAL LOW (ref 4.22–5.81)
RDW: 13.3 % (ref 11.5–15.5)
WBC Count: 6.1 10*3/uL (ref 4.0–10.5)
nRBC: 0 % (ref 0.0–0.2)

## 2021-12-01 NOTE — Progress Notes (Signed)
?  [place] ?OFFICE PROGRESS NOTE ? ? ?Diagnosis: Colon cancer ? ?INTERVAL HISTORY:  ? ?Wyatt Meyer returns for follow-up.  He was due to begin cycle 7 Xeloda on 11/06/2021.  He did not begin on this date.  He began cycle 7 on 11/20/2021.  He denies nausea/vomiting.  No mouth sores.  No diarrhea.  No hand or foot pain or redness. ? ?Objective: ? ?Vital signs in last 24 hours: ? ?Blood pressure (!) 144/71, pulse 83, temperature 98.1 ?F (36.7 ?C), temperature source Oral, resp. rate 18, height _0  (1.854 m), weight 243 lb 12.8 oz (110.6 kg), SpO2 100 %. ?  ? ?HEENT: No thrush or ulcers. ?Resp: Lungs clear bilaterally. ?Cardio: Regular rate and rhythm. ?GI: No hepatosplenomegaly. ?Vascular: No leg edema. ?Skin: Palms with hyperpigmentation, no skin breakdown.  Soles with hyperpigmentation.  Linear ulceration at the left sole is nearly healed. ? ? ?Lab Results: ? ?Lab Results  ?Component Value Date  ? WBC 6.1 12/01/2021  ? HGB 11.9 (L) 12/01/2021  ? HCT 38.1 (L) 12/01/2021  ? MCV 103.5 (H) 12/01/2021  ? PLT 247 12/01/2021  ? NEUTROABS 3.9 12/01/2021  ? ? ?Imaging: ? ?No results found. ? ?Medications: I have reviewed the patient's current medications. ? ?Assessment/Plan: ?Colon cancer, cecum, stage IIb (T4a N0 M0), status post a laparoscopic right colectomy 05/03/2021 ?0/19 lymph nodes, no perineural or lymphovascular invasion, negative resection margins, no tumor deposits, tumor extends into subserosal connective tissue with microscopic involvement of the serosa, MSS, no loss of mismatch repair protein expression ?CT the abdomen/pelvis 02/03/2021-no bowel obstruction, no adenopathy, probable fatty liver ?CT chest 02/14/2021-2 mm right middle lobe and 6 mm left upper lobe fissural nodule, nonspecific ?Elevated preoperative CEA, repeat CEA 06/05/2021-4.3 ?Guardant Reveal 06/05/2021-negative ?Cycle 1 adjuvant Xeloda 06/05/2021 ?Cycle 2 Xeloda 06/26/2021--reported he was still taking Xeloda 07/14/2021, instructed to  stop further Xeloda with the plan to resume with cycle 3 on 07/24/2021 ?Cycle 3 Xeloda 07/24/2021 ?Cycle 4 Xeloda 08/14/2021-reported he was still taking Xeloda 08/31/2021, instructed to stop further Xeloda with the plan to resume with cycle 5 on 09/08/2021 ?Guardant reveal 08/31/2021-negative ?Cycle 5 Xeloda 09/08/2021 ?Xeloda placed on hold due to hand-foot syndrome 09/28/2021 ?Cycle 6 Xeloda resumed 10/16/2021, dose reduced to 1000 mg twice daily for 14 days/7 day break ?Cycle 7 Xeloda 11/20/2021 ?  ?Iron deficiency anemia secondary to #1 ?Diabetes ?Peripheral neuropathy ?History of prostate cancer, status post prostatectomy 08/29/2012, pT2c, N0, MX, Gleason 7 ?Hand/foot syndrome-Xeloda placed on hold 09/28/2021 ?  ? ?Disposition: Wyatt Meyer appears stable.  He misunderstood the start date for cycle 7 Xeloda and began on 11/20/2021 rather than 11/06/2021.  He will continue for a total of 14 days followed by a 7-day break.  He will return for lab and follow-up on 12/11/2021 with the plan to begin the eighth and final cycle of Xeloda that day if he is doing well. ? ?CBC from today reviewed.  Counts adequate to continue with Xeloda as above. ? ?He will return for lab and follow-up 12/11/2021. ? ? ? ?Ned Card ANP/GNP-BC  ? ?12/01/2021  ?8:26 AM ? ? ? ? ? ? ? ?

## 2021-12-07 ENCOUNTER — Other Ambulatory Visit (HOSPITAL_COMMUNITY): Payer: Self-pay

## 2021-12-11 ENCOUNTER — Other Ambulatory Visit: Payer: Self-pay

## 2021-12-11 ENCOUNTER — Encounter: Payer: Self-pay | Admitting: Oncology

## 2021-12-11 ENCOUNTER — Inpatient Hospital Stay: Payer: BC Managed Care – PPO

## 2021-12-11 ENCOUNTER — Other Ambulatory Visit (HOSPITAL_COMMUNITY): Payer: Self-pay

## 2021-12-11 ENCOUNTER — Inpatient Hospital Stay: Payer: BC Managed Care – PPO | Admitting: Oncology

## 2021-12-11 VITALS — BP 139/66 | HR 85 | Temp 98.1°F | Resp 18 | Ht 73.0 in | Wt 238.6 lb

## 2021-12-11 DIAGNOSIS — C182 Malignant neoplasm of ascending colon: Secondary | ICD-10-CM

## 2021-12-11 DIAGNOSIS — C180 Malignant neoplasm of cecum: Secondary | ICD-10-CM | POA: Diagnosis not present

## 2021-12-11 LAB — CMP (CANCER CENTER ONLY)
ALT: 20 U/L (ref 0–44)
AST: 24 U/L (ref 15–41)
Albumin: 3.8 g/dL (ref 3.5–5.0)
Alkaline Phosphatase: 111 U/L (ref 38–126)
Anion gap: 4 — ABNORMAL LOW (ref 5–15)
BUN: 18 mg/dL (ref 8–23)
CO2: 28 mmol/L (ref 22–32)
Calcium: 9.3 mg/dL (ref 8.9–10.3)
Chloride: 106 mmol/L (ref 98–111)
Creatinine: 1.29 mg/dL — ABNORMAL HIGH (ref 0.61–1.24)
GFR, Estimated: >60 mL/min (ref >60)
Glucose, Bld: 210 mg/dL — ABNORMAL HIGH (ref 70–99)
Potassium: 4.1 mmol/L (ref 3.5–5.1)
Sodium: 138 mmol/L (ref 135–145)
Total Bilirubin: 0.4 mg/dL (ref 0.3–1.2)
Total Protein: 7.4 g/dL (ref 6.5–8.1)

## 2021-12-11 LAB — CEA (ACCESS): CEA (CHCC): 4.83 ng/mL (ref 0.00–5.00)

## 2021-12-11 LAB — CBC WITH DIFFERENTIAL (CANCER CENTER ONLY)
Abs Immature Granulocytes: 0.01 10*3/uL (ref 0.00–0.07)
Basophils Absolute: 0 10*3/uL (ref 0.0–0.1)
Basophils Relative: 0 %
Eosinophils Absolute: 0.2 10*3/uL (ref 0.0–0.5)
Eosinophils Relative: 2 %
HCT: 38.4 % — ABNORMAL LOW (ref 39.0–52.0)
Hemoglobin: 12.1 g/dL — ABNORMAL LOW (ref 13.0–17.0)
Immature Granulocytes: 0 %
Lymphocytes Relative: 33 %
Lymphs Abs: 2.1 10*3/uL (ref 0.7–4.0)
MCH: 32 pg (ref 26.0–34.0)
MCHC: 31.5 g/dL (ref 30.0–36.0)
MCV: 101.6 fL — ABNORMAL HIGH (ref 80.0–100.0)
Monocytes Absolute: 0.7 10*3/uL (ref 0.1–1.0)
Monocytes Relative: 11 %
Neutro Abs: 3.3 10*3/uL (ref 1.7–7.7)
Neutrophils Relative %: 54 %
Platelet Count: 193 10*3/uL (ref 150–400)
RBC: 3.78 MIL/uL — ABNORMAL LOW (ref 4.22–5.81)
RDW: 13 % (ref 11.5–15.5)
WBC Count: 6.3 10*3/uL (ref 4.0–10.5)
nRBC: 0 % (ref 0.0–0.2)

## 2021-12-11 NOTE — Progress Notes (Signed)
?  [place] ?OFFICE PROGRESS NOTE ? ? ?Diagnosis: Colon cancer ? ?INTERVAL HISTORY:  ? ?Wyatt Meyer completed another cycle Xeloda beginning 11/20/2021.  No mouth sores, nausea, or diarrhea.  He reports improvement in the skin changes at the hands and feet. ? ?Objective: ? ?Vital signs in last 24 hours: ? ?Blood pressure 139/66, pulse 85, temperature 98.1 ?F (36.7 ?C), temperature source Oral, resp. rate 18, height 6' 1" (1.854 m), weight 238 lb 9.6 oz (108.2 kg), SpO2 100 %. ?  ? ?HEENT: No thrush or ulcers ?Resp: Lungs clear bilaterally ?Cardio: Regular rate and rhythm ?GI: No hepatosplenomegaly ?Vascular: No leg edema  ?Skin: Skin thickening at the palms, mild hyperpigmentation and skin thickening at the soles, callus at the lateral left proximal plantar region, no skin breakdown ? ? ?Lab Results: ? ?Lab Results  ?Component Value Date  ? WBC 6.3 12/11/2021  ? HGB 12.1 (L) 12/11/2021  ? HCT 38.4 (L) 12/11/2021  ? MCV 101.6 (H) 12/11/2021  ? PLT 193 12/11/2021  ? NEUTROABS 3.3 12/11/2021  ? ? ?CMP  ?Lab Results  ?Component Value Date  ? NA 139 12/01/2021  ? K 4.2 12/01/2021  ? CL 105 12/01/2021  ? CO2 27 12/01/2021  ? GLUCOSE 198 (H) 12/01/2021  ? BUN 16 12/01/2021  ? CREATININE 1.37 (H) 12/01/2021  ? CALCIUM 9.1 12/01/2021  ? PROT 7.6 12/01/2021  ? ALBUMIN 3.9 12/01/2021  ? AST 22 12/01/2021  ? ALT 20 12/01/2021  ? ALKPHOS 143 (H) 12/01/2021  ? BILITOT 0.3 12/01/2021  ? GFRNONAA 58 (L) 12/01/2021  ? GFRAA 84 04/04/2016  ? ? ?Lab Results  ?Component Value Date  ? CEA 4.30 06/05/2021  ? ? ? ?Medications: I have reviewed the patient's current medications. ? ? ?Assessment/Plan: ?Colon cancer, cecum, stage IIb (T4a N0 M0), status post a laparoscopic right colectomy 05/03/2021 ?0/19 lymph nodes, no perineural or lymphovascular invasion, negative resection margins, no tumor deposits, tumor extends into subserosal connective tissue with microscopic involvement of the serosa, MSS, no loss of mismatch repair  protein expression ?CT the abdomen/pelvis 02/03/2021-no bowel obstruction, no adenopathy, probable fatty liver ?CT chest 02/14/2021-2 mm right middle lobe and 6 mm left upper lobe fissural nodule, nonspecific ?Elevated preoperative CEA, repeat CEA 06/05/2021-4.3 ?Guardant Reveal 06/05/2021-negative ?Cycle 1 adjuvant Xeloda 06/05/2021 ?Cycle 2 Xeloda 06/26/2021--reported he was still taking Xeloda 07/14/2021, instructed to stop further Xeloda with the plan to resume with cycle 3 on 07/24/2021 ?Cycle 3 Xeloda 07/24/2021 ?Cycle 4 Xeloda 08/14/2021-reported he was still taking Xeloda 08/31/2021, instructed to stop further Xeloda with the plan to resume with cycle 5 on 09/08/2021 ?Guardant reveal 08/31/2021-negative ?Cycle 5 Xeloda 09/08/2021 ?Xeloda placed on hold due to hand-foot syndrome 09/28/2021 ?Cycle 6 Xeloda resumed 10/16/2021, dose reduced to 1000 mg twice daily for 14 days/7 day break ?Cycle 7 Xeloda 11/20/2021 ?Cycle 8 Xeloda 12/11/2021 ?  ?Iron deficiency anemia secondary to #1 ?Diabetes ?Peripheral neuropathy ?History of prostate cancer, status post prostatectomy 08/29/2012, pT2c, N0, MX, Gleason 7 ?Hand/foot syndrome-Xeloda placed on hold 09/28/2021, improved ?  ? ? ? ?Disposition: ?Wyatt Hassebrock has completed 7 cycles of Xeloda.  The hand/foot symptoms are improved.  He will complete a final cycle of Xeloda beginning today.  He will return for an office visit and surveillance CTs in 2 months. ? ?Betsy Coder, MD ? ?12/11/2021  ?8:36 AM ? ? ?

## 2021-12-13 ENCOUNTER — Other Ambulatory Visit (HOSPITAL_COMMUNITY): Payer: Self-pay

## 2021-12-15 ENCOUNTER — Other Ambulatory Visit (HOSPITAL_COMMUNITY): Payer: Self-pay

## 2021-12-26 ENCOUNTER — Telehealth: Payer: Self-pay

## 2021-12-26 NOTE — Telephone Encounter (Signed)
Patient call in to check when was his CT scan going to be schedule and his next office visit. I let him know his appt. are schedule in May. Patient gave verbal understanding. ?

## 2022-02-12 ENCOUNTER — Ambulatory Visit (HOSPITAL_BASED_OUTPATIENT_CLINIC_OR_DEPARTMENT_OTHER)
Admission: RE | Admit: 2022-02-12 | Discharge: 2022-02-12 | Disposition: A | Discharge status: Home / Self Care | Payer: BC Managed Care – PPO | Source: Ambulatory Visit | Attending: Oncology | Admitting: Oncology

## 2022-02-12 ENCOUNTER — Inpatient Hospital Stay: Payer: BC Managed Care – PPO | Attending: Oncology

## 2022-02-12 DIAGNOSIS — C182 Malignant neoplasm of ascending colon: Secondary | ICD-10-CM | POA: Diagnosis present

## 2022-02-12 DIAGNOSIS — E119 Type 2 diabetes mellitus without complications: Secondary | ICD-10-CM | POA: Insufficient documentation

## 2022-02-12 DIAGNOSIS — Z8546 Personal history of malignant neoplasm of prostate: Secondary | ICD-10-CM | POA: Insufficient documentation

## 2022-02-12 DIAGNOSIS — G629 Polyneuropathy, unspecified: Secondary | ICD-10-CM | POA: Insufficient documentation

## 2022-02-12 DIAGNOSIS — D509 Iron deficiency anemia, unspecified: Secondary | ICD-10-CM | POA: Insufficient documentation

## 2022-02-12 DIAGNOSIS — Z08 Encounter for follow-up examination after completed treatment for malignant neoplasm: Secondary | ICD-10-CM | POA: Insufficient documentation

## 2022-02-12 DIAGNOSIS — Z85038 Personal history of other malignant neoplasm of large intestine: Secondary | ICD-10-CM | POA: Insufficient documentation

## 2022-02-12 LAB — CBC WITH DIFFERENTIAL (CANCER CENTER ONLY)
Abs Immature Granulocytes: 0.02 10*3/uL (ref 0.00–0.07)
Basophils Absolute: 0 10*3/uL (ref 0.0–0.1)
Basophils Relative: 0 %
Eosinophils Absolute: 0.1 10*3/uL (ref 0.0–0.5)
Eosinophils Relative: 1 %
HCT: 38.3 % — ABNORMAL LOW (ref 39.0–52.0)
Hemoglobin: 11.9 g/dL — ABNORMAL LOW (ref 13.0–17.0)
Immature Granulocytes: 0 %
Lymphocytes Relative: 25 %
Lymphs Abs: 1.7 10*3/uL (ref 0.7–4.0)
MCH: 30.6 pg (ref 26.0–34.0)
MCHC: 31.1 g/dL (ref 30.0–36.0)
MCV: 98.5 fL (ref 80.0–100.0)
Monocytes Absolute: 0.6 10*3/uL (ref 0.1–1.0)
Monocytes Relative: 9 %
Neutro Abs: 4.3 10*3/uL (ref 1.7–7.7)
Neutrophils Relative %: 65 %
Platelet Count: 221 10*3/uL (ref 150–400)
RBC: 3.89 MIL/uL — ABNORMAL LOW (ref 4.22–5.81)
RDW: 12.9 % (ref 11.5–15.5)
WBC Count: 6.8 10*3/uL (ref 4.0–10.5)
nRBC: 0 % (ref 0.0–0.2)

## 2022-02-12 LAB — BASIC METABOLIC PANEL - CANCER CENTER ONLY
Anion gap: 7 (ref 5–15)
BUN: 15 mg/dL (ref 8–23)
CO2: 27 mmol/L (ref 22–32)
Calcium: 9.5 mg/dL (ref 8.9–10.3)
Chloride: 103 mmol/L (ref 98–111)
Creatinine: 1.19 mg/dL (ref 0.61–1.24)
GFR, Estimated: >60 mL/min (ref >60)
Glucose, Bld: 196 mg/dL — ABNORMAL HIGH (ref 70–99)
Potassium: 4.6 mmol/L (ref 3.5–5.1)
Sodium: 137 mmol/L (ref 135–145)

## 2022-02-12 LAB — CEA (ACCESS): CEA (CHCC): 5.04 ng/mL — ABNORMAL HIGH (ref 0.00–5.00)

## 2022-02-12 MED ORDER — IOHEXOL 300 MG/ML  SOLN
100.0000 mL | Freq: Once | INTRAMUSCULAR | Status: AC | PRN
  Administered 2022-02-12: 100 mL via INTRAVENOUS

## 2022-02-13 ENCOUNTER — Encounter: Payer: Self-pay | Admitting: Oncology

## 2022-02-13 ENCOUNTER — Inpatient Hospital Stay: Payer: BC Managed Care – PPO | Admitting: Oncology

## 2022-02-13 DIAGNOSIS — Z8546 Personal history of malignant neoplasm of prostate: Secondary | ICD-10-CM | POA: Diagnosis not present

## 2022-02-13 DIAGNOSIS — Z85038 Personal history of other malignant neoplasm of large intestine: Secondary | ICD-10-CM | POA: Diagnosis present

## 2022-02-13 DIAGNOSIS — C182 Malignant neoplasm of ascending colon: Secondary | ICD-10-CM | POA: Diagnosis not present

## 2022-02-13 DIAGNOSIS — G629 Polyneuropathy, unspecified: Secondary | ICD-10-CM | POA: Diagnosis not present

## 2022-02-13 DIAGNOSIS — Z08 Encounter for follow-up examination after completed treatment for malignant neoplasm: Secondary | ICD-10-CM | POA: Diagnosis present

## 2022-02-13 DIAGNOSIS — E119 Type 2 diabetes mellitus without complications: Secondary | ICD-10-CM | POA: Diagnosis not present

## 2022-02-13 DIAGNOSIS — D509 Iron deficiency anemia, unspecified: Secondary | ICD-10-CM | POA: Diagnosis not present

## 2022-02-13 NOTE — Progress Notes (Signed)
Wyatt Meyer   Diagnosis: Colon cancer  INTERVAL HISTORY:   Wyatt Meyer returns as scheduled.  He feels well.  He has an improved energy level since completing chemotherapy.  No difficulty with bowel function.  No bleeding.  Objective:  Vital signs in last 24 hours:  There were no vitals taken for this visit.    Lymphatics: No cervical, supraclavicular, axillary, or inguinal nodes Resp: Lungs clear bilaterally Cardio: Regular rate and rhythm GI: No mass, nontender, no hepatosplenomegaly Vascular: No leg edema  Skin: Mild hyperpigmentation of the hands   Lab Results:  Lab Results  Component Value Date   WBC 6.8 02/12/2022   HGB 11.9 (L) 02/12/2022   HCT 38.3 (L) 02/12/2022   MCV 98.5 02/12/2022   PLT 221 02/12/2022   NEUTROABS 4.3 02/12/2022    CMP  Lab Results  Component Value Date   NA 137 02/12/2022   K 4.6 02/12/2022   CL 103 02/12/2022   CO2 27 02/12/2022   GLUCOSE 196 (H) 02/12/2022   BUN 15 02/12/2022   CREATININE 1.19 02/12/2022   CALCIUM 9.5 02/12/2022   PROT 7.4 12/11/2021   ALBUMIN 3.8 12/11/2021   AST 24 12/11/2021   ALT 20 12/11/2021   ALKPHOS 111 12/11/2021   BILITOT 0.4 12/11/2021   GFRNONAA >60 02/12/2022   GFRAA 84 04/04/2016    Lab Results  Component Value Date   CEA 5.04 (H) 02/12/2022     Imaging:  CT CHEST ABDOMEN PELVIS W CONTRAST  Result Date: 02/12/2022 CLINICAL DATA:  Colon cancer. Restaging. History of prostatectomy. * Tracking Code: BO * EXAM: CT CHEST, ABDOMEN, AND PELVIS WITH CONTRAST TECHNIQUE: Multidetector CT imaging of the chest, abdomen and pelvis was performed following the standard protocol during bolus administration of intravenous contrast. RADIATION DOSE REDUCTION: This exam was performed according to the departmental dose-optimization program which includes automated exposure control, adjustment of the mA and/or kV according to patient size and/or use of iterative  reconstruction technique. CONTRAST:  152m OMNIPAQUE IOHEXOL 300 MG/ML  SOLN COMPARISON:  CT chest 02/14/2021.  Abdomen/pelvis CT 02/03/2021. FINDINGS: CT CHEST FINDINGS Cardiovascular: The heart size is normal. No substantial pericardial effusion. Mild atherosclerotic calcification is noted in the wall of the thoracic aorta. Aberrant origin right subclavian artery noted. Mediastinum/Nodes: 12 mm short axis subcarinal lymph node is decreased from 14 mm (remeasured) previously. There is no hilar lymphadenopathy. The esophagus has normal imaging features. 13 mm short axis lymph node right thoracic inlet was 14 mm short axis previously (remeasured). There is no axillary lymphadenopathy. Lungs/Pleura: 6 mm left upper lobe perifissural nodule on 76/4 is unchanged in the interval, likely subpleural lymph node. 2 mm nodule in the lingula (100/4) is also unchanged consistent with benign etiology. 2 mm right middle lobe nodule on 85/4 is also stable consistent with benign etiology. No new suspicious pulmonary nodule or mass. No focal airspace consolidation. No pleural effusion. Musculoskeletal: No worrisome lytic or sclerotic osseous abnormality. CT ABDOMEN PELVIS FINDINGS Hepatobiliary: No suspicious focal abnormality within the liver parenchyma. There is no evidence for gallstones, gallbladder wall thickening, or pericholecystic fluid. No intrahepatic or extrahepatic biliary dilation. Pancreas: No focal mass lesion. No dilatation of the main duct. No intraparenchymal cyst. No peripancreatic edema. Spleen: No splenomegaly. No focal mass lesion. Adrenals/Urinary Tract: No adrenal nodule or mass. Kidneys unremarkable. No evidence for hydroureter. The urinary bladder appears normal for the degree of distention. Stomach/Bowel: Stomach is unremarkable. No gastric wall thickening. No evidence  of outlet obstruction. Duodenum is normally positioned as is the ligament of Treitz. No small bowel wall thickening. No small bowel  dilatation. Status post right hemicolectomy. No gross colonic mass. No colonic wall thickening. Vascular/Lymphatic: There is moderate atherosclerotic calcification of the abdominal aorta without aneurysm. There is no gastrohepatic or hepatoduodenal ligament lymphadenopathy. No retroperitoneal or mesenteric lymphadenopathy. No pelvic sidewall lymphadenopathy. Reproductive: Prostate gland surgically absent. Other: No intraperitoneal free fluid. Musculoskeletal: No worrisome lytic or sclerotic osseous abnormality. IMPRESSION: 1. No new or progressive findings in the chest, abdomen, or pelvis to suggest recurrent or metastatic disease. 2. Status post right hemicolectomy. 3. Stable tiny bilateral pulmonary nodules, consistent with benign etiology. No specific imaging follow-up warranted. 4. Aortic Atherosclerosis (ICD10-I70.0). Electronically Signed   By: Misty Stanley M.D.   On: 02/12/2022 11:58    Medications: I have reviewed the patient's current medications.   Assessment/Plan: Colon cancer, cecum, stage IIb (T4a N0 M0), status post a laparoscopic right colectomy 05/03/2021 0/19 lymph nodes, no perineural or lymphovascular invasion, negative resection margins, no tumor deposits, tumor extends into subserosal connective tissue with microscopic involvement of the serosa, MSS, no loss of mismatch repair protein expression CT the abdomen/pelvis 02/03/2021-no bowel obstruction, no adenopathy, probable fatty liver CT chest 02/14/2021-2 mm right middle lobe and 6 mm left upper lobe fissural nodule, nonspecific Elevated preoperative CEA, repeat CEA 06/05/2021-4.3 Guardant Reveal 06/05/2021-negative Cycle 1 adjuvant Xeloda 06/05/2021 Cycle 2 Xeloda 06/26/2021--reported he was still taking Xeloda 07/14/2021, instructed to stop further Xeloda with the plan to resume with cycle 3 on 07/24/2021 Cycle 3 Xeloda 07/24/2021 Cycle 4 Xeloda 08/14/2021-reported he was still taking Xeloda 08/31/2021, instructed to stop further  Xeloda with the plan to resume with cycle 5 on 09/08/2021 Guardant reveal 08/31/2021-negative Cycle 5 Xeloda 09/08/2021 Xeloda placed on hold due to hand-foot syndrome 09/28/2021 Cycle 6 Xeloda resumed 10/16/2021, dose reduced to 1000 mg twice daily for 14 days/7 day break Cycle 7 Xeloda 11/20/2021 Cycle 8 Xeloda 12/11/2021 CTs 02/12/2022-no evidence of recurrent disease, stable tiny bilateral pulmonary nodules-benign   Iron deficiency anemia secondary to #1 Diabetes Peripheral neuropathy History of prostate cancer, status post prostatectomy 08/29/2012, pT2c, N0, MX, Gleason 7 Hand/foot syndrome-Xeloda placed on hold 09/28/2021, improved        Disposition: Wyatt Meyer is in clinical remission from colon cancer.  The CEA is at the upper end of the normal range.  He will return for a repeat CEA in 3 months.  He will be scheduled for an office visit in 6 months.  We will continue yearly surveillance imaging.  He requested we check the PSA when he returns in 3 months.  He will follow-up with Dr. Tarri Glenn for a surveillance colonoscopy.  Betsy Coder, MD  02/13/2022  9:03 AM

## 2022-02-23 ENCOUNTER — Encounter: Payer: Self-pay | Admitting: Gastroenterology

## 2022-04-09 ENCOUNTER — Telehealth: Payer: Self-pay | Admitting: *Deleted

## 2022-04-09 NOTE — Telephone Encounter (Signed)
John Nulty,CRNA,  Please review. Okay for [place]? Last colon was at [facility] on 01/27/2021. Thanks, Tavonte Seybold PV

## 2022-04-10 NOTE — Telephone Encounter (Signed)
Dr Tarri Glenn,  Per Jenny Reichmann pt is a difficult intubation- Do you want him to have an OV or direct at [facility]?  Please advise, Thanks, Marijean Niemann

## 2022-04-10 NOTE — Telephone Encounter (Signed)
Robbin,  Since his last colonoscopy it was determined that he is a difficult intubation. His procedure will need to be done at the hospital.    Thanks,  Osvaldo Angst

## 2022-04-13 NOTE — Telephone Encounter (Signed)
Please see Dr Tarri Glenn note. Thanks Lelan Pons

## 2022-04-17 NOTE — Telephone Encounter (Signed)
Spoke with patient regarding his procedure & that it would need to be scheduled at [facility]. He has been offered a spot for 04/30/22, but has declined. He would prefer to discuss it over with his family, and call back to reschedule.

## 2022-04-30 NOTE — Telephone Encounter (Signed)
Inbound call from patient calling back to reschedule procedure at [facility]. Please give a call back to further advise.  Thank you

## 2022-05-02 ENCOUNTER — Other Ambulatory Visit: Payer: Self-pay

## 2022-05-02 DIAGNOSIS — D509 Iron deficiency anemia, unspecified: Secondary | ICD-10-CM

## 2022-05-02 DIAGNOSIS — Z1211 Encounter for screening for malignant neoplasm of colon: Secondary | ICD-10-CM

## 2022-05-02 DIAGNOSIS — K6389 Other specified diseases of intestine: Secondary | ICD-10-CM

## 2022-05-02 NOTE — Telephone Encounter (Signed)
Patient has been scheduled for colon at [facility] on 06/11/22 at 7:30 am with Dr. Tarri Glenn. Hospital orders placed.

## 2022-05-02 NOTE — Telephone Encounter (Signed)
Patient called back again to proceed on rescheduling an appointment for procedure at [facility]. Please call to advise.

## 2022-05-03 NOTE — Telephone Encounter (Signed)
Patient is aware of new scheduling date time & has been scheduled for PV on 06/01/22 at 8:00 am. He has been provided instructions on when/where to go, verbalized all understanding.

## 2022-05-10 ENCOUNTER — Ambulatory Visit (INDEPENDENT_AMBULATORY_CARE_PROVIDER_SITE_OTHER): Payer: BC Managed Care – PPO | Admitting: Family Medicine

## 2022-05-10 ENCOUNTER — Encounter: Payer: Self-pay | Admitting: Family Medicine

## 2022-05-10 VITALS — BP 128/68 | HR 64 | Temp 98.7°F | Ht 73.0 in | Wt 242.2 lb

## 2022-05-10 DIAGNOSIS — N3001 Acute cystitis with hematuria: Secondary | ICD-10-CM | POA: Diagnosis not present

## 2022-05-10 DIAGNOSIS — E1165 Type 2 diabetes mellitus with hyperglycemia: Secondary | ICD-10-CM

## 2022-05-10 DIAGNOSIS — C189 Malignant neoplasm of colon, unspecified: Secondary | ICD-10-CM | POA: Diagnosis not present

## 2022-05-10 DIAGNOSIS — Z794 Long term (current) use of insulin: Secondary | ICD-10-CM

## 2022-05-10 DIAGNOSIS — Z8546 Personal history of malignant neoplasm of prostate: Secondary | ICD-10-CM

## 2022-05-10 LAB — POCT URINALYSIS DIPSTICK
Bilirubin, UA: 1
Blood, UA: POSITIVE
Glucose, UA: POSITIVE — AB
Ketones, UA: POSITIVE
Leukocytes, UA: NEGATIVE
Nitrite, UA: POSITIVE
Protein, UA: POSITIVE — AB
Spec Grav, UA: 1.025 (ref 1.010–1.025)
Urobilinogen, UA: NEGATIVE E.U./dL — AB
pH, UA: 5.5 (ref 5.0–8.0)

## 2022-05-10 MED ORDER — CIPROFLOXACIN HCL 500 MG PO TABS: 500.0000 mg | ORAL_TABLET | Freq: Two times a day (BID) | ORAL | 0 refills | Status: AC

## 2022-05-10 NOTE — Progress Notes (Signed)
Subjective  CC:  Chief Complaint  Patient presents with   Back Pain    Pt stated that he has been having some lower back pain and his urine has a funny smell which started about 1 week ago. Pain is about the same with the help of Aleve.    Same day acute visit; PCP not available. New pt to me. Chart reviewed.   HPI: Wyatt Meyer is a 61 y.o. male who presents to the office today to address the problems listed above in the chief complaint. 61 year old male with multiple medical problems including on current treatment for colon cancer, history of prostate cancer status post prostatectomy, uncontrolled type 2 diabetes on long-term insulin who presents with approximately 5 to 7 days: Foul-smelling urine without penile discharge, intermittent low back pain, bilateral without gross hematuria, fevers, chills, nausea or vomiting.  He has history of urinary tract infections in the past.  Probably related to his prior cancer treatments and staying dehydrated.  He denies ever having history of pyelonephritis.  He has no history of kidney stones.  No change in his diabetic control although he does admit his sugars are running high due to noncompliance with healthy diet.  His fasting sugar today was 300.  Unfortunately this is not unusual for him.  He denies abdominal pain.  Appetite is normal. Reviewed recent oncology notes.  Undergoing treatment for colon cancer. Lab Results  Component Value Date   HGBA1C 10.1 (A) 11/14/2021    Assessment  1. Acute cystitis with hematuria   2. Malignant neoplasm of colon, unspecified part of colon ([place])   3. History of prostate cancer   4. Type 2 diabetes mellitus with hyperglycemia, with long-term current use of insulin (H[facility])      Plan  Acute cystitis: Complicated medical patient due to uncontrolled diabetes, history of prostate cancer and colon cancer.  He is immunosuppressed.  Treat with Cipro 500 twice daily x7 days.  Patient to monitor for worsening  symptoms.  Check urine culture.  Recommend increasing hydration. Uncontrolled diabetes: Patient to monitor his sugars and adjust his regular insulin as directed.  Also recommend increasing water intake and avoiding sweets.  He has been eating more cake due to recent birthday celebrations. Urine positive for glucose and ketones. Discussed importance of bringing sugars down and hydrating well.    Follow up: As needed if not improving. Visit date not found  Orders Placed This Encounter  Procedures   Urine Culture   POCT Urinalysis Dipstick   Meds ordered this encounter  Medications   ciprofloxacin (CIPRO) 500 MG tablet    Sig: Take 1 tablet (500 mg total) by mouth 2 (two) times daily for 7 days.    Dispense:  14 tablet    Refill:  0      I reviewed the patients updated PMH, FH, and SocHx.    Patient Active Problem List   Diagnosis Date Noted   S/P right hemicolectomy 05/03/2021   Colon cancer ([place]) 03/31/2021   Leg edema 04/19/2020   Vitamin B12 deficiency 07/23/2017   Type 2 diabetes mellitus with hyperglycemia ([place]) 02/15/2017   Smoker    Erectile dysfunction following radical prostatectomy 10/23/2016   Hyperlipidemia 03/23/2015   History of prostate cancer 03/23/2015   SUI (stress urinary incontinence), male 02/03/2013   Osteoarthritis 08/11/2012   Diabetic neuropathy ([place]) 08/11/2012   Current Meds  Medication Sig   ciprofloxacin (CIPRO) 500 MG tablet Take 1 tablet (500 mg total) by mouth  2 (two) times daily for 7 days.   Continuous Blood Gluc Sensor (FREESTYLE LIBRE 2 SENSOR) MISC 1 Device by Does not apply route every 14 (fourteen) days.   Insulin Glargine (BASAGLAR KWIKPEN) 100 UNIT/ML Inject 60 Units into the skin every morning.   insulin lispro (HUMALOG) 100 UNIT/ML KwikPen    Insulin Pen Needle (BD PEN NEEDLE NANO U/F) 32G X 4 MM MISC use four times a day   Iron, Ferrous Sulfate, 325 (65 Fe) MG TABS Take 1 tablet by mouth every other day. Take WITH Vitamin C  (Patient taking differently: Take 1 tablet by mouth every other day.)   ONE TOUCH LANCETS MISC Use 3x a day   vitamin C (ASCORBIC ACID) 500 MG tablet Take 500 mg by mouth daily.   Current Facility-Administered Medications for the 05/10/22 encounter (Office Visit) with Leamon Arnt, MD  Medication   0.9 %  sodium chloride infusion    Allergies: Patient is allergic to codeine. Family History: Patient family history includes Diabetes in his mother; Heart disease in his father; Hyperlipidemia in his mother and sister; Thyroid cancer in his sister and sister. Social History:  Patient  reports that he has been smoking cigarettes. He has been smoking an average of .5 packs per day. He has never used smokeless tobacco. He reports current alcohol use. He reports that he does not use drugs.  Review of Systems: Constitutional: Negative for fever malaise or anorexia Cardiovascular: negative for chest pain Respiratory: negative for SOB or persistent cough Gastrointestinal: negative for abdominal pain  Objective  Vitals: BP 128/68   Pulse 64   Temp 98.7 F (37.1 C)   Ht '6\' 1"'$  (1.854 m)   Wt 242 lb 3.2 oz (109.9 kg)   SpO2 95%   BMI 31.95 kg/m  General: no acute distress , A&Ox3, nontoxic HEENT: PEERL, conjunctiva normal, neck is supple Cardiovascular:  RRR without murmur or gallop.  Respiratory:  Good breath sounds bilaterally, CTAB with normal respiratory effort, no CVA tenderness Gastrointestinal: soft, flat abdomen, normal active bowel sounds, no palpable masses, no hepatosplenomegaly, no appreciated hernias Nontender, no suprapubic tenderness Skin:  Warm, no rashes  Office Visit on 05/10/2022  Component Date Value Ref Range Status   Color, UA 05/10/2022 dark yellow   Final   Clarity, UA 05/10/2022 cloudy   Final   Glucose, UA 05/10/2022 Positive (A)  Negative Final   Bilirubin, UA 05/10/2022 1   Final   Ketones, UA 05/10/2022 positive   Final   Spec Grav, UA 05/10/2022 1.025   1.010 - 1.025 Final   Blood, UA 05/10/2022 positive   Final   pH, UA 05/10/2022 5.5  5.0 - 8.0 Final   Protein, UA 05/10/2022 Positive (A)  Negative Final   Urobilinogen, UA 05/10/2022 negative (A)  0.2 or 1.0 E.U./dL Final   Nitrite, UA 05/10/2022 positive   Final   Leukocytes, UA 05/10/2022 Negative  Negative Final      Commons side effects, risks, benefits, and alternatives for medications and treatment plan prescribed today were discussed, and the patient expressed understanding of the given instructions. Patient is instructed to call or message via MyChart if he/she has any questions or concerns regarding our treatment plan. No barriers to understanding were identified. We discussed Red Flag symptoms and signs in detail. Patient expressed understanding regarding what to do in case of urgent or emergency type symptoms.  Medication list was reconciled, printed and provided to the patient in AVS. Patient instructions and summary  information was reviewed with the patient as documented in the AVS. This note was prepared with assistance of Dragon voice recognition software. Occasional wrong-word or sound-a-like substitutions may have occurred due to the inherent limitations of voice recognition software  This visit occurred during the SARS-CoV-2 public health emergency.  Safety protocols were in place, including screening questions prior to the visit, additional usage of staff PPE, and extensive cleaning of exam room while observing appropriate contact time as indicated for disinfecting solutions.

## 2022-05-10 NOTE — Patient Instructions (Signed)
Please follow up if symptoms do not improve or as needed.    Urinary Tract Infection, Adult  A urinary tract infection (UTI) is an infection of any part of the urinary tract. The urinary tract includes the kidneys, ureters, bladder, and urethra. These organs make, store, and get rid of urine in the body. An upper UTI affects the ureters and kidneys. A lower UTI affects the bladder and urethra. What are the causes? Most urinary tract infections are caused by bacteria in your genital area around your urethra, where urine leaves your body. These bacteria grow and cause inflammation of your urinary tract. What increases the risk? You are more likely to develop this condition if: You have a urinary catheter that stays in place. You are not able to control when you urinate or have a bowel movement (incontinence). You are male and you: Use a spermicide or diaphragm for birth control. Have low estrogen levels. Are pregnant. You have certain genes that increase your risk. You are sexually active. You take antibiotic medicines. You have a condition that causes your flow of urine to slow down, such as: An enlarged prostate, if you are male. Blockage in your urethra. A kidney stone. A nerve condition that affects your bladder control (neurogenic bladder). Not getting enough to drink, or not urinating often. You have certain medical conditions, such as: Diabetes. A weak disease-fighting system (immunesystem). Sickle cell disease. Gout. Spinal cord injury. What are the signs or symptoms? Symptoms of this condition include: Needing to urinate right away (urgency). Frequent urination. This may include small amounts of urine each time you urinate. Pain or burning with urination. Blood in the urine. Urine that smells bad or unusual. Trouble urinating. Cloudy urine. Vaginal discharge, if you are male. Pain in the abdomen or the lower back. You may also have: Vomiting or a decreased  appetite. Confusion. Irritability or tiredness. A fever or chills. Diarrhea. The first symptom in older adults may be confusion. In some cases, they may not have any symptoms until the infection has worsened. How is this diagnosed? This condition is diagnosed based on your medical history and a physical exam. You may also have other tests, including: Urine tests. Blood tests. Tests for STIs (sexually transmitted infections). If you have had more than one UTI, a cystoscopy or imaging studies may be done to determine the cause of the infections. How is this treated? Treatment for this condition includes: Antibiotic medicine. Over-the-counter medicines to treat discomfort. Drinking enough water to stay hydrated. If you have frequent infections or have other conditions such as a kidney stone, you may need to see a health care provider who specializes in the urinary tract (urologist). In rare cases, urinary tract infections can cause sepsis. Sepsis is a life-threatening condition that occurs when the body responds to an infection. Sepsis is treated in the hospital with IV antibiotics, fluids, and other medicines. Follow these instructions at home:  Medicines Take over-the-counter and prescription medicines only as told by your health care provider. If you were prescribed an antibiotic medicine, take it as told by your health care provider. Do not stop using the antibiotic even if you start to feel better. General instructions Make sure you: Empty your bladder often and completely. Do not hold urine for long periods of time. Empty your bladder after sex. Wipe from front to back after urinating or having a bowel movement if you are male. Use each tissue only one time when you wipe. Drink enough fluid to keep   your urine pale yellow. Keep all follow-up visits. This is important. Contact a health care provider if: Your symptoms do not get better after 1-2 days. Your symptoms go away and then  return. Get help right away if: You have severe pain in your back or your lower abdomen. You have a fever or chills. You have nausea or vomiting. Summary A urinary tract infection (UTI) is an infection of any part of the urinary tract, which includes the kidneys, ureters, bladder, and urethra. Most urinary tract infections are caused by bacteria in your genital area. Treatment for this condition often includes antibiotic medicines. If you were prescribed an antibiotic medicine, take it as told by your health care provider. Do not stop using the antibiotic even if you start to feel better. Keep all follow-up visits. This is important. This information is not intended to replace advice given to you by your health care provider. Make sure you discuss any questions you have with your health care provider. Document Revised: 04/22/2020 Document Reviewed: 04/22/2020 Elsevier Patient Education  2023 Elsevier Inc.  

## 2022-05-11 IMAGING — CT CT CHEST W/O CM
2 of 4 series · 15 of 36 positions shown, 18 images · non-contrast
Comparison: CT abdomen pelvis, 02/03/2021

CLINICAL DATA: New diagnosis colon cancer, mass identified by
colonoscopy, chest staging

EXAM:
CT CHEST WITHOUT CONTRAST
TECHNIQUE: Multidetector CT imaging of the chest was performed following the
standard protocol without IV contrast.

[Series 2: thorax · axial · 0.93mm/px · z∈[+1228,+1528]mm · 12 of 178 slices shown, 15 images]
[im 14/178  mediastinal]
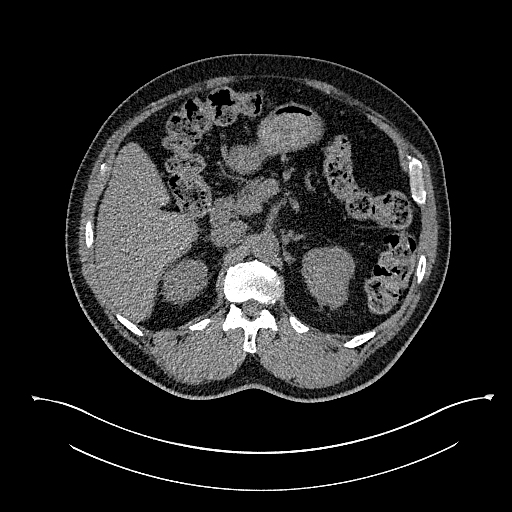
[im 14/178  lung]
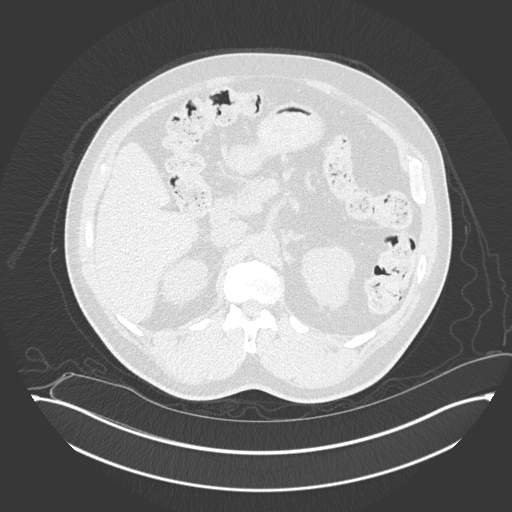
[im 28/178  lung]
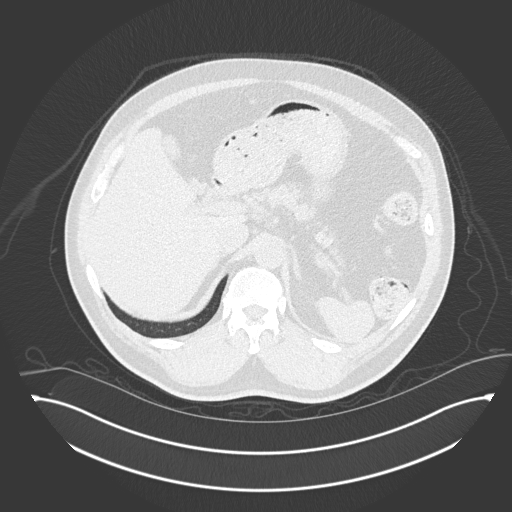
[im 41/178  lung]
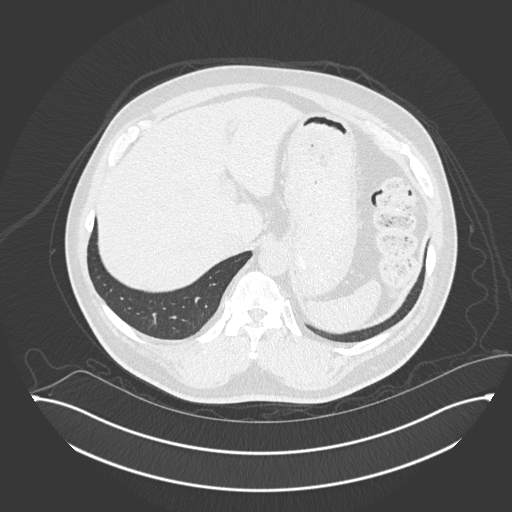
[im 55/178  lung]
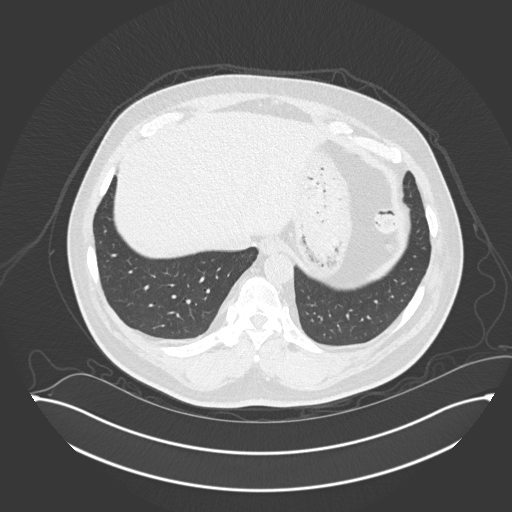
[im 69/178  mediastinal]
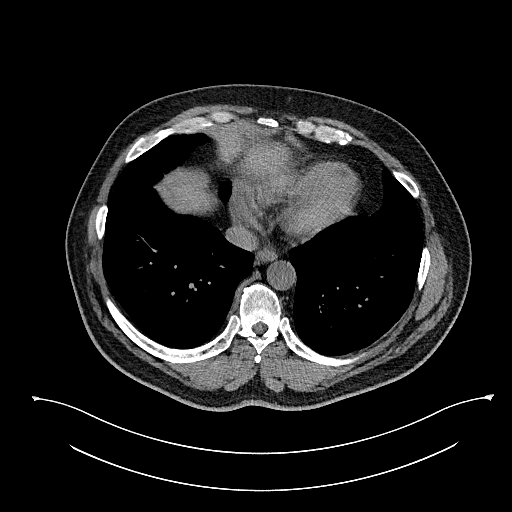
[im 69/178  lung]
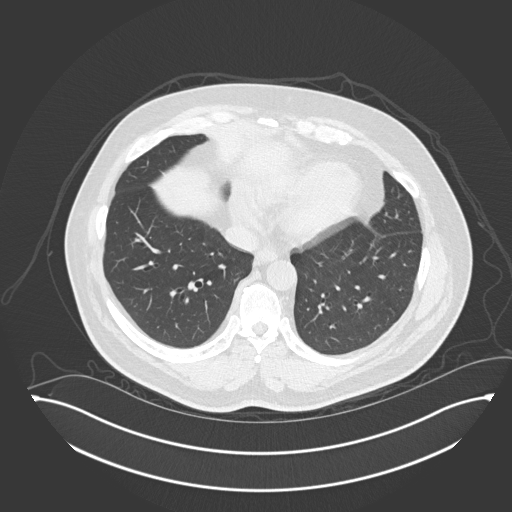
[im 82/178  lung]
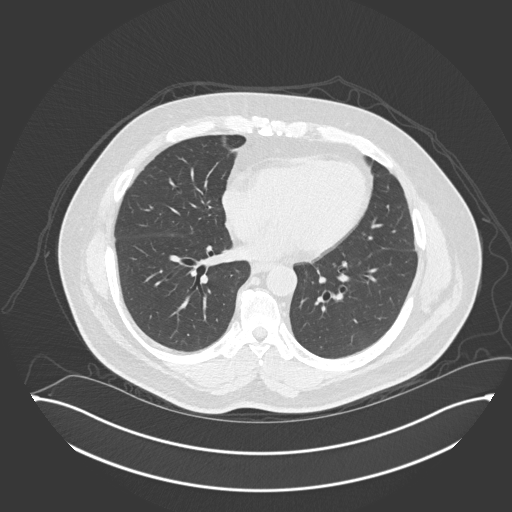
[im 96/178  lung]
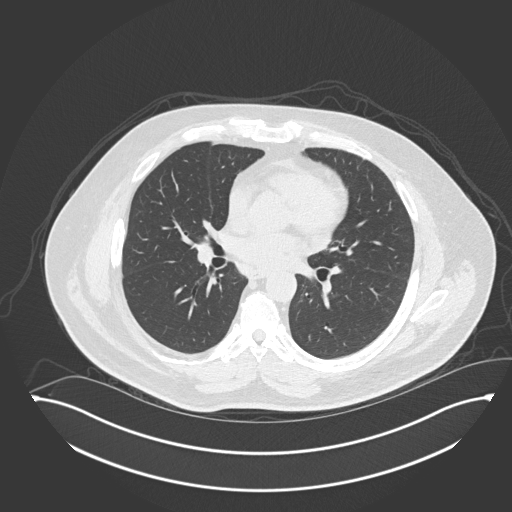
[im 109/178  lung]
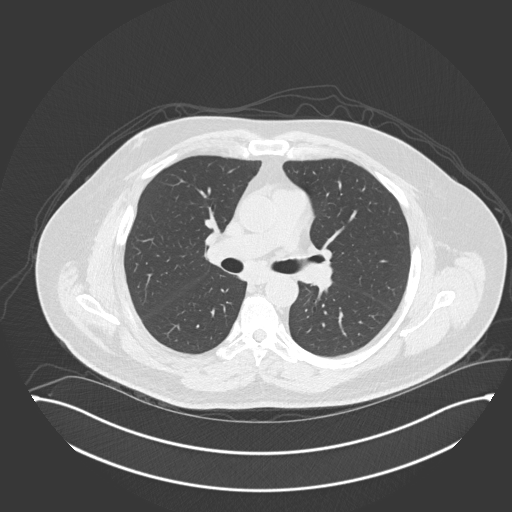
[im 123/178  mediastinal]
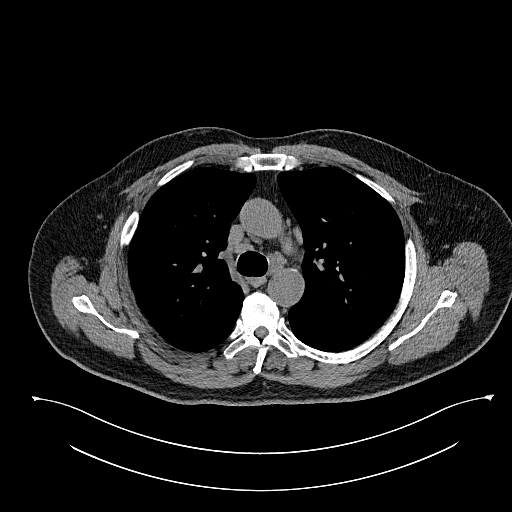
[im 123/178  lung]
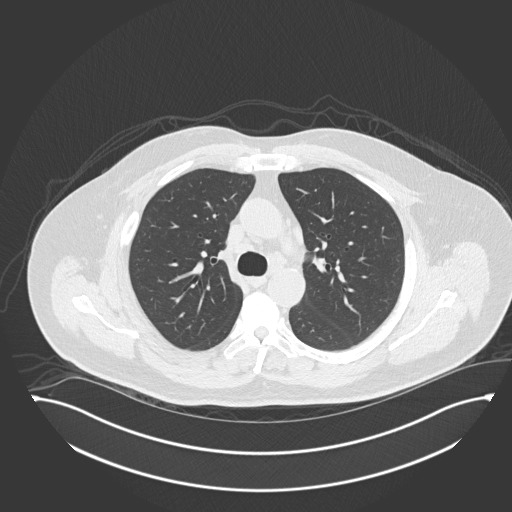
[im 137/178  lung]
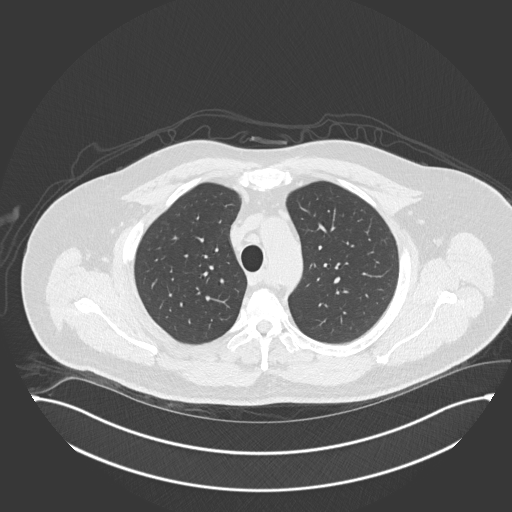
[im 150/178  lung]
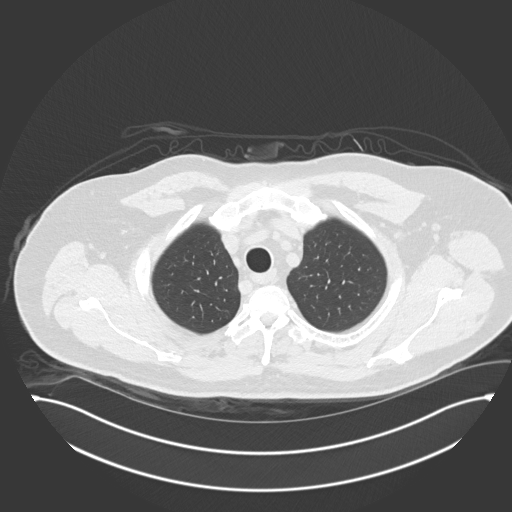
[im 164/178  lung]
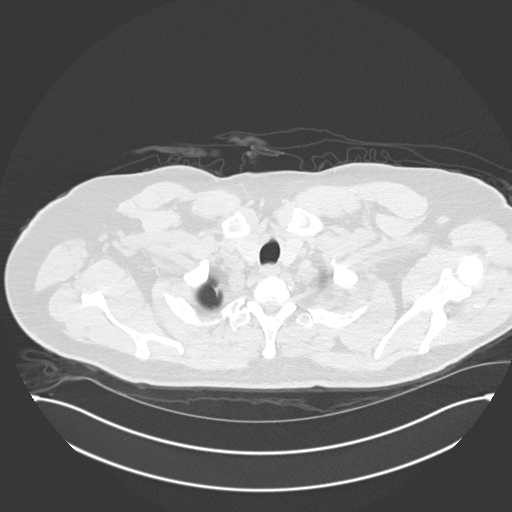

[Series 6: coronal · coronal · 0.77mm/px · 3 of 177 slices shown]
[im 36/177  lung]
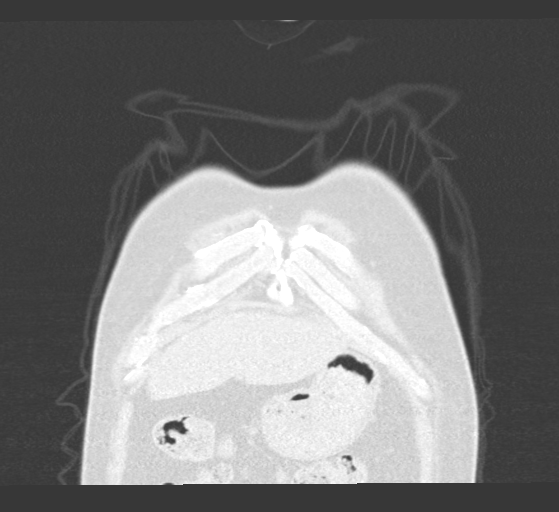
[im 71/177  lung]
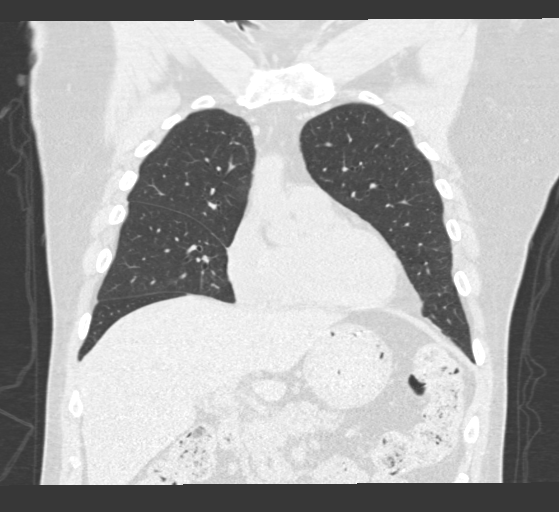
[im 106/177  lung]
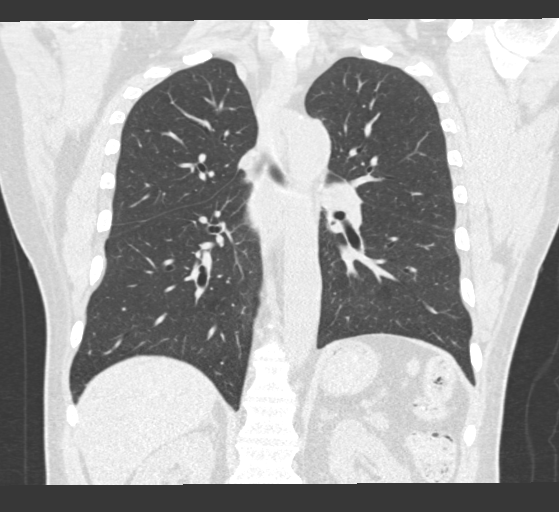

[15 of 36 positions shown; findings below may reference images not displayed]

FINDINGS: Cardiovascular: Scattered aortic atherosclerosis. Incidental note of
aberrant retroesophageal chin origin of the right subclavian artery.
Normal heart size. Scattered coronary artery calcifications. No
pericardial effusion.

Mediastinum/Nodes: No enlarged mediastinal, hilar, or axillary lymph
nodes. Thyroid gland, trachea, and esophagus demonstrate no
significant findings.

Lungs/Pleura: 2 mm nodule of the lateral segment right middle lobe
(series 5, image 81). 6 mm fissural nodule of the posterior left
upper lobe (series 5, image 73). No pleural effusion or
pneumothorax.

Upper Abdomen: No acute abnormality.

Musculoskeletal: No chest wall mass or suspicious bone lesions
identified.
IMPRESSION: 1. There is a 2 mm nodule of the lateral segment right middle lobe.
6 mm fissural nodule of the posterior left upper lobe. These are
nonspecific but most likely benign sequelae of prior infection or
inflammation, metastatic disease not favored. Attention on
follow-up.
2. No other evidence of metastatic disease in the chest.
3. Coronary artery disease.

Aortic Atherosclerosis ([id number]).

## 2022-05-13 LAB — URINE CULTURE
MICRO NUMBER:: 13794086
SPECIMEN QUALITY:: ADEQUATE

## 2022-05-14 NOTE — Progress Notes (Signed)
Please inform patient of the following:  His urine culture confirms UTI.  The antibiotic that Dr. Jonni Sanger started him on should treat this.  Would like for him to let us know if his symptoms are not improving.

## 2022-05-16 ENCOUNTER — Other Ambulatory Visit: Payer: Self-pay

## 2022-05-16 ENCOUNTER — Telehealth: Payer: Self-pay

## 2022-05-16 ENCOUNTER — Inpatient Hospital Stay: Payer: BC Managed Care – PPO | Attending: Oncology

## 2022-05-16 DIAGNOSIS — Z8546 Personal history of malignant neoplasm of prostate: Secondary | ICD-10-CM | POA: Insufficient documentation

## 2022-05-16 DIAGNOSIS — Z85038 Personal history of other malignant neoplasm of large intestine: Secondary | ICD-10-CM | POA: Insufficient documentation

## 2022-05-16 DIAGNOSIS — C182 Malignant neoplasm of ascending colon: Secondary | ICD-10-CM

## 2022-05-16 LAB — CBC WITH DIFFERENTIAL (CANCER CENTER ONLY)
Abs Immature Granulocytes: 0.01 10*3/uL (ref 0.00–0.07)
Basophils Absolute: 0 10*3/uL (ref 0.0–0.1)
Basophils Relative: 1 %
Eosinophils Absolute: 0.1 10*3/uL (ref 0.0–0.5)
Eosinophils Relative: 1 %
HCT: 39.2 % (ref 39.0–52.0)
Hemoglobin: 12.6 g/dL — ABNORMAL LOW (ref 13.0–17.0)
Immature Granulocytes: 0 %
Lymphocytes Relative: 22 %
Lymphs Abs: 1.4 10*3/uL (ref 0.7–4.0)
MCH: 30.7 pg (ref 26.0–34.0)
MCHC: 32.1 g/dL (ref 30.0–36.0)
MCV: 95.6 fL (ref 80.0–100.0)
Monocytes Absolute: 0.5 10*3/uL (ref 0.1–1.0)
Monocytes Relative: 8 %
Neutro Abs: 4.4 10*3/uL (ref 1.7–7.7)
Neutrophils Relative %: 68 %
Platelet Count: 229 10*3/uL (ref 150–400)
RBC: 4.1 MIL/uL — ABNORMAL LOW (ref 4.22–5.81)
RDW: 13 % (ref 11.5–15.5)
WBC Count: 6.3 10*3/uL (ref 4.0–10.5)
nRBC: 0 % (ref 0.0–0.2)

## 2022-05-16 LAB — CEA (ACCESS): CEA (CHCC): 6.4 ng/mL — ABNORMAL HIGH (ref 0.00–5.00)

## 2022-05-16 NOTE — Telephone Encounter (Signed)
Pt verbalized understanding.

## 2022-05-16 NOTE — Telephone Encounter (Signed)
-----   Message from Ladell Pier, MD sent at 05/16/2022  3:10 PM EDT ----- Please call patient, the CEA is mildly elevated, likely a benign finding, repeat in 4-6 weeks, follow-up as scheduled

## 2022-05-17 LAB — PROSTATE-SPECIFIC AG, SERUM (LABCORP): Prostate Specific Ag, Serum: 2.8 ng/mL (ref 0.0–4.0)

## 2022-05-18 ENCOUNTER — Telehealth: Payer: Self-pay

## 2022-05-18 NOTE — Telephone Encounter (Signed)
-----   Message from Ladell Pier, MD sent at 05/17/2022  9:56 PM EDT ----- Please call patient, psa is normal, f/u as scheduled

## 2022-05-18 NOTE — Telephone Encounter (Signed)
Pt verbalized understanding.

## 2022-05-21 ENCOUNTER — Encounter: Payer: BC Managed Care – PPO | Admitting: Gastroenterology

## 2022-06-01 ENCOUNTER — Ambulatory Visit (AMBULATORY_SURGERY_CENTER): Payer: Self-pay | Admitting: *Deleted

## 2022-06-01 ENCOUNTER — Encounter: Payer: Self-pay | Admitting: Gastroenterology

## 2022-06-01 VITALS — Ht 73.0 in | Wt 240.0 lb

## 2022-06-01 DIAGNOSIS — Z85038 Personal history of other malignant neoplasm of large intestine: Secondary | ICD-10-CM

## 2022-06-01 MED ORDER — NA SULFATE-K SULFATE-MG SULF 17.5-3.13-1.6 GM/177ML PO SOLN: 1.0000 | ORAL | 0 refills | Status: DC

## 2022-06-01 NOTE — Progress Notes (Signed)
Patient is here in-person for PV. Patient denies any allergies to eggs or soy. Patient denies any problems with anesthesia/sedation. Patient is not on any oxygen at home. Patient is not taking any diet/weight loss medications or blood thinners. Went over procedure prep instructions with the patient. Patient is aware of our care-partner policy. Patient notified to use Good-Rx for prescription.   Patient denies constipation.   

## 2022-06-04 ENCOUNTER — Encounter (HOSPITAL_COMMUNITY): Payer: Self-pay | Admitting: Gastroenterology

## 2022-06-04 NOTE — Progress Notes (Signed)
Attempted to obtain medical history via telephone, unable to reach at this time. HIPAA compliant voicemail message left requesting return call to pre surgical testing department. 

## 2022-06-11 ENCOUNTER — Ambulatory Visit (HOSPITAL_COMMUNITY): Payer: BC Managed Care – PPO | Admitting: Certified Registered Nurse Anesthetist

## 2022-06-11 ENCOUNTER — Telehealth: Payer: Self-pay

## 2022-06-11 ENCOUNTER — Encounter (HOSPITAL_COMMUNITY): Payer: Self-pay | Admitting: Gastroenterology

## 2022-06-11 ENCOUNTER — Encounter (HOSPITAL_COMMUNITY)
Admission: RE | Disposition: A | Discharge status: Home / Self Care | Payer: Self-pay | Source: Home / Self Care | Attending: Gastroenterology

## 2022-06-11 ENCOUNTER — Ambulatory Visit (HOSPITAL_COMMUNITY)
Admission: RE | Admit: 2022-06-11 | Discharge: 2022-06-11 | Disposition: A | Discharge status: Home / Self Care | Payer: BC Managed Care – PPO | Attending: Gastroenterology | Admitting: Gastroenterology

## 2022-06-11 ENCOUNTER — Other Ambulatory Visit: Payer: Self-pay

## 2022-06-11 DIAGNOSIS — Z08 Encounter for follow-up examination after completed treatment for malignant neoplasm: Secondary | ICD-10-CM | POA: Diagnosis present

## 2022-06-11 DIAGNOSIS — K648 Other hemorrhoids: Secondary | ICD-10-CM | POA: Diagnosis not present

## 2022-06-11 DIAGNOSIS — F17200 Nicotine dependence, unspecified, uncomplicated: Secondary | ICD-10-CM | POA: Diagnosis not present

## 2022-06-11 DIAGNOSIS — Z794 Long term (current) use of insulin: Secondary | ICD-10-CM | POA: Insufficient documentation

## 2022-06-11 DIAGNOSIS — Z1211 Encounter for screening for malignant neoplasm of colon: Secondary | ICD-10-CM

## 2022-06-11 DIAGNOSIS — M1990 Unspecified osteoarthritis, unspecified site: Secondary | ICD-10-CM | POA: Diagnosis not present

## 2022-06-11 DIAGNOSIS — E119 Type 2 diabetes mellitus without complications: Secondary | ICD-10-CM | POA: Insufficient documentation

## 2022-06-11 DIAGNOSIS — Z8546 Personal history of malignant neoplasm of prostate: Secondary | ICD-10-CM | POA: Diagnosis not present

## 2022-06-11 DIAGNOSIS — D509 Iron deficiency anemia, unspecified: Secondary | ICD-10-CM

## 2022-06-11 DIAGNOSIS — Z980 Intestinal bypass and anastomosis status: Secondary | ICD-10-CM | POA: Diagnosis not present

## 2022-06-11 DIAGNOSIS — K5730 Diverticulosis of large intestine without perforation or abscess without bleeding: Secondary | ICD-10-CM | POA: Insufficient documentation

## 2022-06-11 DIAGNOSIS — K6389 Other specified diseases of intestine: Secondary | ICD-10-CM

## 2022-06-11 DIAGNOSIS — Z85038 Personal history of other malignant neoplasm of large intestine: Secondary | ICD-10-CM | POA: Diagnosis not present

## 2022-06-11 HISTORY — PX: COLONOSCOPY WITH PROPOFOL: SHX5780

## 2022-06-11 LAB — GLUCOSE, CAPILLARY
Glucose-Capillary: 154 mg/dL — ABNORMAL HIGH (ref 70–99)
Glucose-Capillary: 144 mg/dL — ABNORMAL HIGH (ref 70–99)

## 2022-06-11 SURGERY — COLONOSCOPY WITH PROPOFOL
Anesthesia: Monitor Anesthesia Care

## 2022-06-11 MED ORDER — PROPOFOL 500 MG/50ML IV EMUL
INTRAVENOUS | Status: DC | PRN
  Administered 2022-06-11: 100 ug/kg/min via INTRAVENOUS

## 2022-06-11 MED ORDER — PROPOFOL 10 MG/ML IV BOLUS
INTRAVENOUS | Status: DC | PRN
  Administered 2022-06-11 (×2): 10 mg via INTRAVENOUS

## 2022-06-11 MED ORDER — SODIUM CHLORIDE 0.9 % IV SOLN: INTRAVENOUS | Status: DC

## 2022-06-11 MED ORDER — SODIUM CHLORIDE 0.9 % IV SOLN
INTRAVENOUS | Status: AC | PRN
  Administered 2022-06-11: 500 mL via INTRAVENOUS

## 2022-06-11 SURGICAL SUPPLY — 22 items

## 2022-06-11 NOTE — Op Note (Signed)
[facility] Patient Name: Wyatt Meyer Procedure Date : 06/11/2022 MRN: [id number] Attending MD: Thornton Park MD, MD Date of Birth: 10-03-1960 CSN: [id number] Age: 61 Admit Type: Inpatient Procedure:                Colonoscopy Indications:              High risk colon cancer surveillance: Personal                            history of colon cancer diagnosed 01/2021                           history of stage IIb (T4a N0 M0), status post a                            laparoscopic right colectomy 05/03/2021 Providers:                Thornton Park MD, MD, Janee Morn,                            Technician, Mikey College, RN Referring MD:              Medicines:                Monitored Anesthesia Care Complications:            No immediate complications. Estimated Blood Loss:     Estimated blood loss: none. Procedure:                Pre-Anesthesia Assessment:                           - Prior to the procedure, a History and Physical                            was performed, and patient medications and                            allergies were reviewed. The patient's tolerance of                            previous anesthesia was also reviewed. The risks                            and benefits of the procedure and the sedation                            options and risks were discussed with the patient.                            All questions were answered, and informed consent                            was obtained. Prior Anticoagulants: The patient has  taken no previous anticoagulant or antiplatelet                            agents. ASA Grade Assessment: III - A patient with                            severe systemic disease. After reviewing the risks                            and benefits, the patient was deemed in                            satisfactory condition to undergo the procedure.                           After  obtaining informed consent, the colonoscope                            was passed under direct vision. Throughout the                            procedure, the patient's blood pressure, pulse, and                            oxygen saturations were monitored continuously. The                            CF-HQ190L ([id number]) Olympus coloscope was                            introduced through the anus and advanced to the 3                            cm into the ileum. A second forward view of the                            right colon was performed. The colonoscopy was                            performed with moderate difficulty due to a                            redundant colon, significant looping and a tortuous                            colon. Successful completion of the procedure was                            aided by applying abdominal pressure. The patient                            tolerated the procedure well. The quality of the  bowel preparation was excellent. The terminal                            ileum, ileocecal valve, appendiceal orifice, and                            rectum were photographed. Scope In: 7:39:54 AM Scope Out: 8:00:32 AM Scope Withdrawal Time: 0 hours 14 minutes 19 seconds  Total Procedure Duration: 0 hours 20 minutes 38 seconds  Findings:      The perianal and digital rectal examinations were normal.      Non-bleeding internal hemorrhoids were found.      A few small-mouthed diverticula were found in the sigmoid colon.      There was evidence of an anastomosis in the ascending colon. This was       patent and was characterized by healthy appearing mucosa.      The exam was otherwise without abnormality on direct and retroflexion       views. Impression:               - Non-bleeding internal hemorrhoids.                           - Diverticulosis in the sigmoid colon.                           - Patent ileo-colonic anastomosis,  characterized by                            healthy appearing mucosa.                           - The examination was otherwise normal on direct                            and retroflexion views.                           - No specimens collected. Recommendation:           - Patient has a contact number available for                            emergencies. The signs and symptoms of potential                            delayed complications were discussed with the                            patient. Return to normal activities tomorrow.                            Written discharge instructions were provided to the                            patient.                           -  High fiber diet.                           - Continue present medications.                           - Repeat colonoscopy in 3 years for surveillance,                            earlier with new symptoms.                           - Emerging evidence supports eating a diet of                            fruits, vegetables, grains, calcium, and yogurt                            while reducing red meat and alcohol may reduce the                            risk of colon cancer.                           - Given these results, all first degree relatives                            (brothers, sisters, children, parents) should start                            colon cancer screening at age 80.                           Results discussed with the patient's wife by phone. Procedure Code(s):        --- Professional ---                           N4627, Colorectal cancer screening; colonoscopy on                            individual at high risk Diagnosis Code(s):        --- Professional ---                           O35.009, Personal history of other malignant                            neoplasm of large intestine                           K64.8, Other hemorrhoids                           Z98.0, Intestinal bypass and  anastomosis status  K57.30, Diverticulosis of large intestine without                            perforation or abscess without bleeding CPT copyright 2019 American Medical Association. All rights reserved. The codes documented in this report are preliminary and upon coder review may  be revised to meet current compliance requirements. Thornton Park MD, MD 06/11/2022 8:14:30 AM This report has been signed electronically. Number of Addenda: 0

## 2022-06-11 NOTE — Anesthesia Preprocedure Evaluation (Addendum)
Anesthesia Evaluation  Patient identified by MRN, date of birth, ID band Patient awake    Reviewed: Allergy & Precautions, NPO status , Patient's Chart, lab work & pertinent test results  Airway Mallampati: II  TM Distance: >3 FB Neck ROM: Full    Dental  (+) Chipped, Loose,    Pulmonary neg pulmonary ROS, Current Smoker and Patient abstained from smoking.,    Pulmonary exam normal        Cardiovascular negative cardio ROS   Rhythm:Regular Rate:Normal     Neuro/Psych negative neurological ROS  negative psych ROS   GI/Hepatic Neg liver ROS, IDA, screening   Endo/Other  diabetes, Type 2, Insulin Dependent  Renal/GU   negative genitourinary   Musculoskeletal  (+) Arthritis , Osteoarthritis,    Abdominal Normal abdominal exam  (+)   Peds  Hematology  (+) Blood dyscrasia, anemia ,   Anesthesia Other Findings   Reproductive/Obstetrics                            Anesthesia Physical Anesthesia Plan  ASA: 2  Anesthesia Plan: MAC   Post-op Pain Management:    Induction: Intravenous  PONV Risk Score and Plan: Propofol infusion and Treatment may vary due to age or medical condition  Airway Management Planned: Simple Face Mask, Natural Airway and Nasal Cannula  Additional Equipment: None  Intra-op Plan:   Post-operative Plan:   Informed Consent: I have reviewed the patients History and Physical, chart, labs and discussed the procedure including the risks, benefits and alternatives for the proposed anesthesia with the patient or authorized representative who has indicated his/her understanding and acceptance.     Dental advisory given  Plan Discussed with:   Anesthesia Plan Comments: (Lab Results      Component                Value               Date                      WBC                      6.3                 05/16/2022                HGB                      12.6 (L)             05/16/2022                HCT                      39.2                05/16/2022                MCV                      95.6                05/16/2022                PLT  229                 05/16/2022          )        Anesthesia Quick Evaluation

## 2022-06-11 NOTE — H&P (Addendum)
   Referring Provider: No ref. provider found Primary Care Physician:  Vivi Barrack, MD  Indication for Procedure:  Colon cancer Surveillance   IMPRESSION:  Need for colon cancer surveillance  PLAN: Colonoscopy today   HPI: Wyatt Meyer is a 61 y.o. male presents for surveillance colonoscopy. 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 Right hemicolectomy 05/03/21. Treated with Xeloda with no evidence for recurrent disease on CT 02/12/22. CEA has been in the upper range of normal. Presents now for one year surveillance colonoscopy.    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/23/2016   Hyperlipidemia    on meds   Iron deficiency anemia    on IRON and Geritol   Kidney stone    Klebsiella sepsis ([place]) 03/25/2015   Osteoarthritis 08/11/2012   Peripheral neuropathy 08/11/2012   Prostate cancer ([place])    Smoker    SUI (stress urinary incontinence), male 02/03/2013   Uncontrolled diabetes mellitus with complications 05/07/4817   on meds    Past Surgical History:  Procedure Laterality Date   COLONOSCOPY  01/27/2021   Dr.Ziyon Cedotal   LAPAROSCOPIC RIGHT HEMI COLECTOMY Right 05/03/2021   Procedure: LAPAROSCOPIC RIGHT HEMI COLECTOMY WITH LYSIS OF ADHESIONS AND BILATERAL TAP BLOCK;  Surgeon: Ileana Roup, MD;  Location: [facility];  Service: General;  Laterality: Right;   PROSTATE SURGERY  09/25/2011   PROSTATE SURGERY     WISDOM TOOTH EXTRACTION      Current Facility-Administered Medications  Medication Dose Route Frequency Provider Last Rate Last Admin   0.9 %  sodium chloride infusion   Intravenous Continuous Thornton Park, MD       0.9 %  sodium chloride infusion    Continuous PRN Thornton Park, MD 10 mL/hr at 06/11/22 0731 Continued from Pre-op at 06/11/22 0731    Allergies as of 05/02/2022 - Review Complete 02/13/2022  Allergen Reaction Noted   Codeine Nausea And  Vomiting 09/24/2011    Family History  Problem Relation Age of Onset   Hyperlipidemia Mother    Diabetes Mother    Heart disease Father    Hyperlipidemia Sister    Thyroid cancer Sister    Thyroid cancer Sister    Esophageal cancer Neg Hx    Rectal cancer Neg Hx    Colon cancer Neg Hx    Stomach cancer Neg Hx    Colon polyps Neg Hx      Physical Exam: General:   Alert,  well-nourished, pleasant and cooperative in NAD Head:  Normocephalic and atraumatic. Eyes:  Sclera clear, no icterus.   Conjunctiva pink. Mouth:  No deformity or lesions.   Neck:  Supple; no masses or thyromegaly. Lungs:  Clear throughout to auscultation.   No wheezes. Heart:  Regular rate and rhythm; no murmurs. Abdomen:  Soft, non-tender, nondistended, normal bowel sounds, no rebound or guarding.  Msk:  Symmetrical. No boney deformities LAD: No inguinal or umbilical LAD Extremities:  No clubbing or edema. Neurologic:  Alert and  oriented x4;  grossly nonfocal Skin:  No obvious rash or bruise. Psych:  Alert and cooperative. Normal mood and affect.     Studies/Results: No results found.    Demiyah Fischbach L. Tarri Glenn, MD, MPH 06/11/2022, 7:33 AM

## 2022-06-11 NOTE — Telephone Encounter (Signed)
-----   Message from Thornton Park, MD sent at 06/11/2022  8:20 AM EDT ----- Surveillance colonoscopy in 3 years. Thanks.  KLB

## 2022-06-11 NOTE — Telephone Encounter (Signed)
Recall placed

## 2022-06-11 NOTE — Anesthesia Procedure Notes (Signed)
Procedure Name: MAC Date/Time: 06/11/2022 7:35 AM  Performed by: Harden Mo, CRNAPre-anesthesia Checklist: Patient identified, Emergency Drugs available, Suction available and Patient being monitored Patient Re-evaluated:Patient Re-evaluated prior to induction Oxygen Delivery Method: Simple face mask Preoxygenation: Pre-oxygenation with 100% oxygen Induction Type: IV induction Placement Confirmation: positive ETCO2 and breath sounds checked- equal and bilateral Dental Injury: Teeth and Oropharynx as per pre-operative assessment

## 2022-06-11 NOTE — Transfer of Care (Signed)
Immediate Anesthesia Transfer of Care Note  Patient: RICKY GALLERY  Procedure(s) Performed: COLONOSCOPY WITH PROPOFOL  Patient Location: PACU  Anesthesia Type:MAC  Level of Consciousness: drowsy, patient cooperative and responds to stimulation  Airway & Oxygen Therapy: Patient Spontanous Breathing and Patient connected to face mask oxygen  Post-op Assessment: Report given to RN and Post -op Vital signs reviewed and stable  Post vital signs: Reviewed and stable  Last Vitals:  Vitals Value Taken Time  BP 115/60 06/11/22 0806  Temp    Pulse 63 06/11/22 0806  Resp 11 06/11/22 0806  SpO2 100 % 06/11/22 0806  Vitals shown include unvalidated device data.  Last Pain:  Vitals:   06/11/22 0645  TempSrc: Temporal  PainSc: 0-No pain         Complications: No notable events documented.

## 2022-06-12 NOTE — Anesthesia Postprocedure Evaluation (Signed)
Anesthesia Post Note  Patient: Wyatt Meyer  Procedure(s) Performed: COLONOSCOPY WITH PROPOFOL     Patient location during evaluation: Endoscopy Anesthesia Type: MAC Level of consciousness: awake and alert Pain management: pain level controlled Vital Signs Assessment: post-procedure vital signs reviewed and stable Respiratory status: spontaneous breathing, nonlabored ventilation, respiratory function stable and patient connected to nasal cannula oxygen Cardiovascular status: stable and blood pressure returned to baseline Postop Assessment: no apparent nausea or vomiting Anesthetic complications: no   No notable events documented.  Last Vitals:  Vitals:   06/11/22 0805 06/11/22 0815  BP:  128/77  Pulse:  60  Resp:  10  Temp: (!) 36.1 C   SpO2:  100%    Last Pain:  Vitals:   06/11/22 0815  TempSrc:   PainSc: 0-No pain                 Belenda Cruise P Claudis Giovanelli

## 2022-06-20 ENCOUNTER — Inpatient Hospital Stay: Payer: BC Managed Care – PPO | Attending: Oncology

## 2022-06-20 DIAGNOSIS — Z85038 Personal history of other malignant neoplasm of large intestine: Secondary | ICD-10-CM | POA: Diagnosis present

## 2022-06-20 DIAGNOSIS — Z8546 Personal history of malignant neoplasm of prostate: Secondary | ICD-10-CM | POA: Insufficient documentation

## 2022-06-20 DIAGNOSIS — C182 Malignant neoplasm of ascending colon: Secondary | ICD-10-CM

## 2022-06-20 LAB — CEA (ACCESS): CEA (CHCC): 6.02 ng/mL — ABNORMAL HIGH (ref 0.00–5.00)

## 2022-06-21 ENCOUNTER — Telehealth: Payer: Self-pay

## 2022-06-21 NOTE — Telephone Encounter (Signed)
-----   Message from Ladell Pier, MD sent at 06/21/2022  7:17 AM EDT ----- Please call patient, CEA is mildly elevated and stable, likely benign finding, repeat next visit

## 2022-06-21 NOTE — Telephone Encounter (Signed)
Called an informed patient of information below. Patient verbalized understanding and next scheduled appointment was confirmed.  Patient instructed to call office if any questions or concerns arise before his next visit.  All questions were answered during this phone call.

## 2022-08-10 ENCOUNTER — Other Ambulatory Visit: Payer: Self-pay | Admitting: *Deleted

## 2022-08-10 ENCOUNTER — Inpatient Hospital Stay: Payer: BC Managed Care – PPO | Admitting: Oncology

## 2022-08-10 ENCOUNTER — Encounter: Payer: Self-pay | Admitting: *Deleted

## 2022-08-10 ENCOUNTER — Inpatient Hospital Stay: Payer: BC Managed Care – PPO | Attending: Oncology

## 2022-08-10 VITALS — BP 147/79 | HR 84 | Temp 98.1°F | Resp 16 | Ht 73.0 in | Wt 235.2 lb

## 2022-08-10 DIAGNOSIS — C182 Malignant neoplasm of ascending colon: Secondary | ICD-10-CM

## 2022-08-10 DIAGNOSIS — Z8546 Personal history of malignant neoplasm of prostate: Secondary | ICD-10-CM | POA: Diagnosis present

## 2022-08-10 DIAGNOSIS — Z08 Encounter for follow-up examination after completed treatment for malignant neoplasm: Secondary | ICD-10-CM | POA: Insufficient documentation

## 2022-08-10 DIAGNOSIS — E1142 Type 2 diabetes mellitus with diabetic polyneuropathy: Secondary | ICD-10-CM | POA: Diagnosis not present

## 2022-08-10 DIAGNOSIS — Z85038 Personal history of other malignant neoplasm of large intestine: Secondary | ICD-10-CM | POA: Diagnosis present

## 2022-08-10 DIAGNOSIS — D509 Iron deficiency anemia, unspecified: Secondary | ICD-10-CM | POA: Insufficient documentation

## 2022-08-10 LAB — CEA (ACCESS): CEA (CHCC): 6.53 ng/mL — ABNORMAL HIGH (ref 0.00–5.00)

## 2022-08-10 NOTE — Progress Notes (Signed)
    [place] OFFICE PROGRESS NOTE   Diagnosis: Colon cancer  INTERVAL HISTORY:  Wyatt Meyer returns as scheduled.  He feels well.  No difficulty with bowel function.  Good appetite.  He is working. He underwent a surveillance colonoscopy on 06/11/2022.  There was diverticulosis in the sigmoid colon.  No specimens collected.  Objective:  Vital signs in last 24 hours:  Blood pressure (!) 147/79, pulse 84, temperature 98.1 F (36.7 C), temperature source Temporal, resp. rate 16, height _0  (1.854 m), weight 235 lb 3.2 oz (106.7 kg), SpO2 100 %.    Lymphatics: No cervical, supraclavicular, axillary, or inguinal nodes Resp: Lungs clear bilaterally Cardio: Regular rate and rhythm GI: No hepatosplenomegaly Vascular: No leg edema   Lab Results:  Lab Results  Component Value Date   WBC 6.3 05/16/2022   HGB 12.6 (L) 05/16/2022   HCT 39.2 05/16/2022   MCV 95.6 05/16/2022   PLT 229 05/16/2022   NEUTROABS 4.4 05/16/2022    CMP  Lab Results  Component Value Date   NA 137 02/12/2022   K 4.6 02/12/2022   CL 103 02/12/2022   CO2 27 02/12/2022   GLUCOSE 196 (H) 02/12/2022   BUN 15 02/12/2022   CREATININE 1.19 02/12/2022   CALCIUM 9.5 02/12/2022   PROT 7.4 12/11/2021   ALBUMIN 3.8 12/11/2021   AST 24 12/11/2021   ALT 20 12/11/2021   ALKPHOS 111 12/11/2021   BILITOT 0.4 12/11/2021   GFRNONAA >60 02/12/2022   GFRAA 84 04/04/2016    Lab Results  Component Value Date   CEA 6.02 (H) 06/20/2022     Medications: I have reviewed the patient's current medications.   Assessment/Plan: Colon cancer, cecum, stage IIb (T4a N0 M0), status post a laparoscopic right colectomy 05/03/2021 0/19 lymph nodes, no perineural or lymphovascular invasion, negative resection margins, no tumor deposits, tumor extends into subserosal connective tissue with microscopic involvement of the serosa, MSS, no loss of mismatch repair protein expression CT the abdomen/pelvis  02/03/2021-no bowel obstruction, no adenopathy, probable fatty liver CT chest 02/14/2021-2 mm right middle lobe and 6 mm left upper lobe fissural nodule, nonspecific Elevated preoperative CEA, repeat CEA 06/05/2021-4.3 Guardant Reveal 06/05/2021-negative Cycle 1 adjuvant Xeloda 06/05/2021 Cycle 2 Xeloda 06/26/2021--reported he was still taking Xeloda 07/14/2021, instructed to stop further Xeloda with the plan to resume with cycle 3 on 07/24/2021 Cycle 3 Xeloda 07/24/2021 Cycle 4 Xeloda 08/14/2021-reported he was still taking Xeloda 08/31/2021, instructed to stop further Xeloda with the plan to resume with cycle 5 on 09/08/2021 Guardant reveal 08/31/2021-negative Cycle 5 Xeloda 09/08/2021 Xeloda placed on hold due to hand-foot syndrome 09/28/2021 Cycle 6 Xeloda resumed 10/16/2021, dose reduced to 1000 mg twice daily for 14 days/7 day break Cycle 7 Xeloda 11/20/2021 Cycle 8 Xeloda 12/11/2021 CTs 02/12/2022-no evidence of recurrent disease, stable tiny bilateral pulmonary nodules-benign Colonoscopy 06/11/2022-diverticulosis in the sigmoid colon   Iron deficiency anemia secondary to #1 Diabetes Peripheral neuropathy History of prostate cancer, status post prostatectomy 08/29/2012, pT2c, N0, MX, Gleason 7 Hand/foot syndrome-Xeloda placed on hold 09/28/2021, improved     Disposition: Wyatt Meyer is in clinical remission from colon cancer.  We will follow-up on the CEA from today.  He will return for an office visit, surveillance CTs, and a CEA in 6 months.  Betsy Coder, MD  08/10/2022  8:08 AM

## 2022-08-15 ENCOUNTER — Other Ambulatory Visit: Payer: BC Managed Care – PPO

## 2022-08-15 ENCOUNTER — Ambulatory Visit: Payer: BC Managed Care – PPO | Admitting: Oncology

## 2022-08-28 ENCOUNTER — Other Ambulatory Visit: Payer: Self-pay | Admitting: Family Medicine

## 2022-08-29 ENCOUNTER — Other Ambulatory Visit: Payer: Self-pay | Admitting: Family Medicine

## 2022-09-01 ENCOUNTER — Other Ambulatory Visit: Payer: Self-pay | Admitting: Family Medicine

## 2022-09-11 ENCOUNTER — Telehealth: Payer: Self-pay | Admitting: Family Medicine

## 2022-09-11 NOTE — Telephone Encounter (Signed)
Patient states: -He is need of a short acting insulin, novolog  - He was previously seeing Dr. Loanne Drilling who managed his diabetes but he is now retired  - Currently takes long acting insulin, basaglar, but only has one pen left   Patient requests: -PCP start managing his diabetes  - Novolog insulin be sent in to [facility] on [address] to get him through the holidays  PCP is not available until first week of January. Is there a way patient can be worked in sooner? Please Advise.

## 2022-09-11 NOTE — Telephone Encounter (Signed)
Please advise 

## 2022-09-12 ENCOUNTER — Other Ambulatory Visit: Payer: Self-pay | Admitting: Family Medicine

## 2022-09-12 NOTE — Telephone Encounter (Signed)
It has been almost a year since we saw him last. Please refill his insulin if needed and have him come in for a visit ASAP.  Wyatt Meyer. Jerline Pain, MD 09/12/2022 7:45 AM

## 2022-09-12 NOTE — Telephone Encounter (Signed)
Please schedule OV with PCP

## 2022-09-21 NOTE — Telephone Encounter (Signed)
Patient walked in stating he needed his diabetes medication. Patient was advised dr Jerline Pain was not in the office today and that he was contacted on 12/20- patient was given Dr Rosario Adie phone number and stated he would call them for refills on his insuline - in the meantime patient was advise to schedule an ov with dr Jerline Pain.

## 2022-10-02 ENCOUNTER — Ambulatory Visit: Payer: BC Managed Care – PPO | Admitting: Family Medicine

## 2022-10-02 ENCOUNTER — Encounter: Payer: Self-pay | Admitting: Family Medicine

## 2022-10-02 VITALS — BP 144/84 | HR 86 | Temp 97.8°F | Ht 73.0 in | Wt 249.2 lb

## 2022-10-02 DIAGNOSIS — E1165 Type 2 diabetes mellitus with hyperglycemia: Secondary | ICD-10-CM

## 2022-10-02 DIAGNOSIS — E785 Hyperlipidemia, unspecified: Secondary | ICD-10-CM | POA: Diagnosis not present

## 2022-10-02 DIAGNOSIS — C189 Malignant neoplasm of colon, unspecified: Secondary | ICD-10-CM

## 2022-10-02 DIAGNOSIS — Z794 Long term (current) use of insulin: Secondary | ICD-10-CM

## 2022-10-02 LAB — POCT GLYCOSYLATED HEMOGLOBIN (HGB A1C): Hemoglobin A1C: 12 % — AB (ref 4.0–5.6)

## 2022-10-02 MED ORDER — BASAGLAR KWIKPEN 100 UNIT/ML ~~LOC~~ SOPN: 60.0000 [IU] | PEN_INJECTOR | SUBCUTANEOUS | 3 refills | Status: DC

## 2022-10-02 MED ORDER — INSULIN LISPRO (1 UNIT DIAL) 100 UNIT/ML (KWIKPEN): PEN_INJECTOR | SUBCUTANEOUS | 5 refills | Status: DC

## 2022-10-02 NOTE — Progress Notes (Signed)
   Wyatt Meyer is a 62 y.o. male who presents today for an office visit.  Assessment/Plan:  New/Acute Problems: Elevated blood pressure reading Mildly elevated today.  He has previously been well-controlled.  Do not need to start meds at this point.  He will continue to monitor at home and let us know if persistently elevated at home.  Chronic Problems Addressed Today: Type 2 diabetes mellitus with hyperglycemia (HCC) A1c today is uncontrolled at 12.0.  He has been taking much lower than his prescribed dose of insulin due to trying to stretch out his prescriptions due to his previous endocrinologist retiring and him not being able to establish with anyone else.  We will represcribe his insulin and his previous dose with Basaglar 60 units in the morning and sliding scale Humalog.  We will refer him to see endocrinology today.  Asked him to follow-up with me in a few weeks via MyChart so that we can adjust the dose of his insulin as needed.  Will check labs today.  If he is not able to get into see endocrinologist he will come back to see [place] in 3 months so that we can recheck his A1c.  Hyperlipidemia Not currently on any medications.  Will likely need to restart statin.  Check labs today.  Colon cancer ([place]) In remission.  Continue management per GI and oncology.     Subjective:  HPI:  Patient here for follow-up.  Was last seen almost a year ago.  Since our last visit he has been following with GI and oncology for his colon cancer.  He has not seen endocrinology for diabetes management.  His previous endocrinologist left the office and he has not established with anyone. He hs run out of his insulin and has been taking much less than prescribed due to trying to make the prescription last longer. His sugars at home have been "high."  See A/P for status of chronic conditions.       Objective:  Physical Exam: BP (!) 144/84   Pulse 86   Temp 97.8 F (36.6 C) (Temporal)   Ht '6\' 1"'$   (1.854 m)   Wt 249 lb 3.2 oz (113 kg)   SpO2 100%   BMI 32.88 kg/m   Gen: No acute distress, resting comfortably CV: Regular rate and rhythm with no murmurs appreciated Pulm: Normal work of breathing, clear to auscultation bilaterally with no crackles, wheezes, or rhonchi Neuro: Grossly normal, moves all extremities Psych: Normal affect and thought content      Wyatt Meyer M. Wyatt Pain, MD 10/02/2022 7:58 AM

## 2022-10-02 NOTE — Patient Instructions (Signed)
It was very nice to see you today!  Your A1c today is 12.  I will represcribe your insulin medications.  I will refer you to endocrinology.  We will check blood work today.  Please send a message in a couple weeks to let me know how your sugars are looking and we can try to adjust your insulin before you get into see the endocrinologist.  Please, to see me in 3 months if you cannot get into see the endocrinologist by then.  Take care, Dr Jerline Pain  PLEASE NOTE:  If you had any lab tests, please let us know if you have not heard back within a few days. You may see your results on mychart before we have a chance to review them but we will give you a call once they are reviewed by [place].   If we ordered any referrals today, please let us know if you have not heard from their office within the next week.   If you had any urgent prescriptions sent in today, please check with the pharmacy within an hour of our visit to make sure the prescription was transmitted appropriately.   Please try these tips to maintain a healthy lifestyle:  Eat at least 3 REAL meals and 1-2 snacks per day.  Aim for no more than 5 hours between eating.  If you eat breakfast, please do so within one hour of getting up.   Each meal should contain half fruits/vegetables, one quarter protein, and one quarter carbs (no bigger than a computer mouse)  Cut down on sweet beverages. This includes juice, soda, and sweet tea.   Drink at least 1 glass of water with each meal and aim for at least 8 glasses per day  Exercise at least 150 minutes every week.

## 2022-10-02 NOTE — Assessment & Plan Note (Signed)
Not currently on any medications.  Will likely need to restart statin.  Check labs today.

## 2022-10-02 NOTE — Assessment & Plan Note (Signed)
A1c today is uncontrolled at 12.0.  He has been taking much lower than his prescribed dose of insulin due to trying to stretch out his prescriptions due to his previous endocrinologist retiring and him not being able to establish with anyone else.  We will represcribe his insulin and his previous dose with Basaglar 60 units in the morning and sliding scale Humalog.  We will refer him to see endocrinology today.  Asked him to follow-up with me in a few weeks via MyChart so that we can adjust the dose of his insulin as needed.  Will check labs today.  If he is not able to get into see endocrinologist he will come back to see [place] in 3 months so that we can recheck his A1c.

## 2022-10-02 NOTE — Assessment & Plan Note (Signed)
In remission.  Continue management per GI and oncology.

## 2022-10-03 ENCOUNTER — Telehealth: Payer: Self-pay | Admitting: *Deleted

## 2022-10-03 NOTE — Telephone Encounter (Signed)
Key: [id number] - Rx #: [id number] Sent to Plan today Drug Basaglar KwikPen 100UNIT/ML pen-injector Waiting for determination

## 2022-10-04 ENCOUNTER — Other Ambulatory Visit: Payer: BC Managed Care – PPO

## 2022-10-04 ENCOUNTER — Telehealth: Payer: Self-pay | Admitting: Family Medicine

## 2022-10-04 NOTE — Telephone Encounter (Signed)
Caller states:  - Calling for clarification of medications - He needs to know the max daily dosage of Humalog short acting insulin  - Basaglar is not longer covered by insurance. Lantus is covered.   Caller requests: -Someone call back to clarify max daily dose on humalog and if patient can be written rx for lantus   Saralyn Pilar can be reached at [phone].

## 2022-10-05 NOTE — Telephone Encounter (Signed)
See note

## 2022-10-08 ENCOUNTER — Other Ambulatory Visit: Payer: Self-pay | Admitting: Family Medicine

## 2022-10-08 MED ORDER — LANTUS SOLOSTAR 100 UNIT/ML ~~LOC~~ SOPN: 60.0000 [IU] | PEN_INJECTOR | Freq: Every day | SUBCUTANEOUS | 0 refills | Status: DC

## 2022-10-08 NOTE — Telephone Encounter (Signed)
Pt came in to check on the status of Basaglar pen. PA was Denied at [employer] Pt stated he is out of the medication; spoke with Dr. Cherlynn Kaiser and she sent him insulin glargine (LANTUS SOLOSTAR) 100 UNIT/ML Solostar Pen.  Advised pt to call pharmacy and make sure it's ready to go pick it up. And if needs PA to call his insurance and find out what's covered so we can send it for him.

## 2022-10-09 NOTE — Telephone Encounter (Signed)
Agree with him checking to see with his insurance to see what is covered.  [name]. Jerline Pain, MD 10/09/2022 9:59 AM

## 2022-10-09 NOTE — Telephone Encounter (Signed)
Dr Cherlynn Kaiser sent in lantus yesterday. Please send in refills if needed.

## 2022-11-30 ENCOUNTER — Telehealth: Payer: Self-pay | Admitting: Family Medicine

## 2022-11-30 NOTE — Telephone Encounter (Signed)
Left message to return call to our office at their convenience.  

## 2022-11-30 NOTE — Telephone Encounter (Signed)
Patient would like to request short acting insulin to be sent to his [facility] pharmacy. Patient las DOS 09/2022 for diabetes.

## 2022-12-05 NOTE — Telephone Encounter (Signed)
Patient returned call. Requests to be called. 

## 2022-12-06 ENCOUNTER — Other Ambulatory Visit: Payer: Self-pay | Admitting: *Deleted

## 2022-12-06 MED ORDER — LANTUS SOLOSTAR 100 UNIT/ML ~~LOC~~ SOPN: 60.0000 [IU] | PEN_INJECTOR | Freq: Every day | SUBCUTANEOUS | 0 refills | Status: DC

## 2022-12-06 NOTE — Telephone Encounter (Signed)
Rx send to [facility] pharmacy  ?

## 2022-12-14 ENCOUNTER — Ambulatory Visit: Payer: BC Managed Care – PPO | Admitting: Family Medicine

## 2022-12-14 ENCOUNTER — Encounter: Payer: Self-pay | Admitting: Family Medicine

## 2022-12-14 VITALS — BP 126/78 | HR 81 | Temp 98.0°F | Ht 73.0 in | Wt 242.8 lb

## 2022-12-14 DIAGNOSIS — Z794 Long term (current) use of insulin: Secondary | ICD-10-CM | POA: Diagnosis not present

## 2022-12-14 DIAGNOSIS — E1165 Type 2 diabetes mellitus with hyperglycemia: Secondary | ICD-10-CM

## 2022-12-14 LAB — GLUCOSE, POCT (MANUAL RESULT ENTRY): POC Glucose: 223 mg/dl — AB (ref 70–99)

## 2022-12-14 MED ORDER — AZELASTINE HCL 0.1 % NA SOLN: 2.0000 | Freq: Two times a day (BID) | NASAL | 12 refills | Status: DC

## 2022-12-14 MED ORDER — INSULIN LISPRO (1 UNIT DIAL) 100 UNIT/ML (KWIKPEN): 5.0000 [IU] | PEN_INJECTOR | Freq: Three times a day (TID) | SUBCUTANEOUS | 11 refills | Status: DC

## 2022-12-14 MED ORDER — LANTUS SOLOSTAR 100 UNIT/ML ~~LOC~~ SOPN: 60.0000 [IU] | PEN_INJECTOR | Freq: Every day | SUBCUTANEOUS | 11 refills | Status: DC

## 2022-12-14 MED ORDER — INSULIN LISPRO (1 UNIT DIAL) 100 UNIT/ML (KWIKPEN): PEN_INJECTOR | SUBCUTANEOUS | 11 refills | Status: DC

## 2022-12-14 NOTE — Patient Instructions (Signed)
It was very nice to see you today!  I will send an electronic prescription for your insulin as well as give you a printed copy of your prescription.  Please try the Astelin nasal spray.  Will see back in 1 to 2 months.  Come back sooner if needed.  Take care, Dr Jerline Pain  PLEASE NOTE:  If you had any lab tests, please let us know if you have not heard back within a few days. You may see your results on mychart before we have a chance to review them but we will give you a call once they are reviewed by [place].   If we ordered any referrals today, please let us know if you have not heard from their office within the next week.   If you had any urgent prescriptions sent in today, please check with the pharmacy within an hour of our visit to make sure the prescription was transmitted appropriately.   Please try these tips to maintain a healthy lifestyle:  Eat at least 3 REAL meals and 1-2 snacks per day.  Aim for no more than 5 hours between eating.  If you eat breakfast, please do so within one hour of getting up.   Each meal should contain half fruits/vegetables, one quarter protein, and one quarter carbs (no bigger than a computer mouse)  Cut down on sweet beverages. This includes juice, soda, and sweet tea.   Drink at least 1 glass of water with each meal and aim for at least 8 glasses per day  Exercise at least 150 minutes every week.

## 2022-12-14 NOTE — Progress Notes (Signed)
   Wyatt Meyer is a 62 y.o. male who presents today for an office visit.  Assessment/Plan:  New/Acute Problems: Rhinitis  No red flags.  Likely seasonal allergies.  Start Astelin.  Can use Claritin or Zyrtec as needed.  Chronic Problems Addressed Today: Type 2 diabetes mellitus with hyperglycemia ([place]) Too early to recheck A1c today.  Last A1c was uncontrolled at 12.0.  He has been out of short acting insulin for a couple of months.  He had previously been taking as a sliding scale.  He lost his freestyle libre monitor and has not been checking his sugar as regularly. Glucose this morning was 223.  We will send prescription in for both his Lantus 60 units daily and Humalog sliding scale.  He typically takes 5 units after meals for elevated blood sugar over 200. We will also give printed prescription for both of these in case there is an issue with electronic transmission.  He does have an appointment with endocrinology in 6 months but we will see him back in 1-2 months to recheck A1c.  We discussed importance of routine glucose monitoring so that we can adjust his insulin as needed.  Also discussed importance of good glucose control to prevent complications including cardiovascular disease and neuropathy.  He will let us know if he wants [place] to send in a prescription for freestyle Elenor Legato - he would like to research online first before making a decision.      Subjective:  HPI:  See A/p for status of chronic conditions.    Has been out of short acting for a couple of months. States that he has been out of short acting insulin for a couple of months.  States that the pharmacy never received the prescription. Sugars typically in the 200s in the morning.  Currently taking 60 units of Lantus daily.  Working on diet and exercise.  He has had some congestion the last few days. No fevers or chills. No pain. No myalgias.        Objective:  Physical Exam: BP 126/78   Pulse 81   Temp 98 F  (36.7 C) (Temporal)   Ht 6\' 1"  (1.854 m)   Wt 242 lb 12.8 oz (110.1 kg)   SpO2 99%   BMI 32.03 kg/m   Gen: No acute distress, resting comfortably CV: Regular rate and rhythm with no murmurs appreciated Pulm: Normal work of breathing, clear to auscultation bilaterally with no crackles, wheezes, or rhonchi Neuro: Grossly normal, moves all extremities Psych: Normal affect and thought content      Oreoluwa Aigner M. Jerline Pain, MD 12/14/2022 8:20 AM

## 2022-12-14 NOTE — Addendum Note (Signed)
Addended by: Vivi Barrack on: 12/14/2022 01:07 PM   Modules accepted: Orders

## 2022-12-14 NOTE — Assessment & Plan Note (Addendum)
Too early to recheck A1c today.  Last A1c was uncontrolled at 12.0.  He has been out of short acting insulin for a couple of months.  He had previously been taking as a sliding scale.  He lost his freestyle libre monitor and has not been checking his sugar as regularly. Glucose this morning was 223.  We will send prescription in for both his Lantus 60 units daily and Humalog sliding scale.  He typically takes 5 units after meals for elevated blood sugar over 200. We will also give printed prescription for both of these in case there is an issue with electronic transmission.  He does have an appointment with endocrinology in 6 months but we will see him back in 1-2 months to recheck A1c.  We discussed importance of routine glucose monitoring so that we can adjust his insulin as needed.  Also discussed importance of good glucose control to prevent complications including cardiovascular disease and neuropathy.  He will let us know if he wants [place] to send in a prescription for freestyle Elenor Legato - he would like to research online first before making a decision.

## 2023-01-28 ENCOUNTER — Ambulatory Visit: Payer: BC Managed Care – PPO | Admitting: Family Medicine

## 2023-01-28 ENCOUNTER — Encounter: Payer: Self-pay | Admitting: Family Medicine

## 2023-01-28 VITALS — BP 122/74 | HR 82 | Temp 97.7°F | Ht 73.0 in | Wt 243.4 lb

## 2023-01-28 DIAGNOSIS — M25511 Pain in right shoulder: Secondary | ICD-10-CM | POA: Diagnosis not present

## 2023-01-28 DIAGNOSIS — E1165 Type 2 diabetes mellitus with hyperglycemia: Secondary | ICD-10-CM | POA: Diagnosis not present

## 2023-01-28 DIAGNOSIS — R202 Paresthesia of skin: Secondary | ICD-10-CM | POA: Diagnosis not present

## 2023-01-28 DIAGNOSIS — M1990 Unspecified osteoarthritis, unspecified site: Secondary | ICD-10-CM

## 2023-01-28 DIAGNOSIS — Z794 Long term (current) use of insulin: Secondary | ICD-10-CM

## 2023-01-28 MED ORDER — FREESTYLE LIBRE READER DEVI: 1 refills | Status: DC

## 2023-01-28 MED ORDER — DICLOFENAC SODIUM 75 MG PO TBEC
75.0000 mg | DELAYED_RELEASE_TABLET | Freq: Two times a day (BID) | ORAL | 0 refills | Status: DC
Start: 2023-01-28 — End: 2023-06-09

## 2023-01-28 NOTE — Assessment & Plan Note (Signed)
Last A1c uncontrolled.  He has not been checking sugar at home as he lost his glucometer.  He is currently prescribed Lantus 60 units daily with sliding scale insulin 3 times daily with meals.  He does have upcoming appointment with endocrinology later this year.  We discussed importance of good glycemic control to help with recovery and reduction of cardiovascular events.  We will send a new prescription for his glucometer today.  He has appointment with me later this month to recheck A1c.

## 2023-01-28 NOTE — Progress Notes (Signed)
   Wyatt Meyer is a 62 y.o. male who presents today for an office visit.  Assessment/Plan:  New/Acute Problems: Right Shoulder Pain No red flags.  Does have some pain with resisted supraspinatus testing and likely has some component of rotator cuff tendinopathy.  Likely has some underlying OA as well.  He is interested in seeing physical therapy.  Will place referral today.  Start diclofenac 75 mg twice daily for the next 14 days.  He will let us know if not improving.  Would consider imaging and referral to sports medicine at that time.   Paresthesia No red flags.  Positive Tinel sign on exam likely has some mild underlying carpal tunnel syndrome.  This is not currently bothersome.  Hopefully will have some improvement above diclofenac.  Will consider referral to sports medicine if this persists.  Chronic Problems Addressed Today: Type 2 diabetes mellitus with hyperglycemia (HCC) Last A1c uncontrolled.  He has not been checking sugar at home as he lost his glucometer.  He is currently prescribed Lantus 60 units daily with sliding scale insulin 3 times daily with meals.  He does have upcoming appointment with endocrinology later this year.  We discussed importance of good glycemic control to help with recovery and reduction of cardiovascular events.  We will send a new prescription for his glucometer today.  He has appointment with me later this month to recheck A1c.  Osteoarthritis Likely contributing to above shoulder pain.  Should have improvement with PT and diclofenac.     Subjective:  HPI:  See A/p for status of chronic conditions.  Patient here with right shoulder pain.  This started about 1-2 weeks ago. No obvious injuries or precipitating events. Tried OTC meds without much improvement.  Sometimes gets numbness in his arm and hand.  This comes and goes.  No other specific treatments tried.       Objective:  Physical Exam: BP 122/74   Pulse 82   Temp 97.7 F (36.5 C)  (Temporal)   Ht 6\' 1"  (1.854 m)   Wt 243 lb 6.4 oz (110.4 kg)   SpO2 99%   BMI 32.11 kg/m   Gen: No acute distress, resting comfortably MSK - Right Arm: No deformities.  Right shoulder with crepitus with active and passive range of motion.  Pain elicited with resisted supraspinatus testing.  No pain with internal or external rotation.  Negative Neer and Hawkins test.  Neurovascular intact distally.  Positive Tinel sign at wrist. Neuro: Grossly normal, moves all extremities Psych: Normal affect and thought content      Wyatt Ogburn M. Jimmey Ralph, MD 01/28/2023 8:05 AM

## 2023-01-28 NOTE — Assessment & Plan Note (Signed)
Likely contributing to above shoulder pain.  Should have improvement with PT and diclofenac.

## 2023-01-28 NOTE — Patient Instructions (Signed)
It was very nice to see you today!  You have inflammation in your rotator cuff.  Please take the diclofenac twice daily for the next 2 weeks.  I will refer you to see the physical therapist.  No follow-ups on file.   Take care, Dr Jimmey Ralph  PLEASE NOTE:  If you had any lab tests, please let us know if you have not heard back within a few days. You may see your results on mychart before we have a chance to review them but we will give you a call once they are reviewed by [place].   If we ordered any referrals today, please let us know if you have not heard from their office within the next week.   If you had any urgent prescriptions sent in today, please check with the pharmacy within an hour of our visit to make sure the prescription was transmitted appropriately.   Please try these tips to maintain a healthy lifestyle:  Eat at least 3 REAL meals and 1-2 snacks per day.  Aim for no more than 5 hours between eating.  If you eat breakfast, please do so within one hour of getting up.   Each meal should contain half fruits/vegetables, one quarter protein, and one quarter carbs (no bigger than a computer mouse)  Cut down on sweet beverages. This includes juice, soda, and sweet tea.   Drink at least 1 glass of water with each meal and aim for at least 8 glasses per day  Exercise at least 150 minutes every week.

## 2023-02-05 ENCOUNTER — Ambulatory Visit: Payer: BC Managed Care – PPO | Attending: Family Medicine

## 2023-02-05 ENCOUNTER — Inpatient Hospital Stay: Payer: BC Managed Care – PPO | Attending: Oncology

## 2023-02-05 ENCOUNTER — Other Ambulatory Visit: Payer: Self-pay

## 2023-02-05 ENCOUNTER — Ambulatory Visit (HOSPITAL_BASED_OUTPATIENT_CLINIC_OR_DEPARTMENT_OTHER)
Admission: RE | Admit: 2023-02-05 | Discharge: 2023-02-05 | Disposition: A | Discharge status: Home / Self Care | Payer: BC Managed Care – PPO | Source: Ambulatory Visit | Attending: Oncology | Admitting: Oncology

## 2023-02-05 DIAGNOSIS — Z85118 Personal history of other malignant neoplasm of bronchus and lung: Secondary | ICD-10-CM | POA: Diagnosis present

## 2023-02-05 DIAGNOSIS — M6281 Muscle weakness (generalized): Secondary | ICD-10-CM | POA: Insufficient documentation

## 2023-02-05 DIAGNOSIS — Z8546 Personal history of malignant neoplasm of prostate: Secondary | ICD-10-CM | POA: Diagnosis not present

## 2023-02-05 DIAGNOSIS — M25611 Stiffness of right shoulder, not elsewhere classified: Secondary | ICD-10-CM | POA: Diagnosis present

## 2023-02-05 DIAGNOSIS — C182 Malignant neoplasm of ascending colon: Secondary | ICD-10-CM

## 2023-02-05 DIAGNOSIS — R252 Cramp and spasm: Secondary | ICD-10-CM | POA: Insufficient documentation

## 2023-02-05 DIAGNOSIS — D509 Iron deficiency anemia, unspecified: Secondary | ICD-10-CM | POA: Insufficient documentation

## 2023-02-05 DIAGNOSIS — R970 Elevated carcinoembryonic antigen [CEA]: Secondary | ICD-10-CM | POA: Diagnosis not present

## 2023-02-05 DIAGNOSIS — M25511 Pain in right shoulder: Secondary | ICD-10-CM | POA: Insufficient documentation

## 2023-02-05 LAB — BASIC METABOLIC PANEL - CANCER CENTER ONLY
Anion gap: 7 (ref 5–15)
BUN: 22 mg/dL (ref 8–23)
CO2: 26 mmol/L (ref 22–32)
Calcium: 9.1 mg/dL (ref 8.9–10.3)
Chloride: 103 mmol/L (ref 98–111)
Creatinine: 1.29 mg/dL — ABNORMAL HIGH (ref 0.61–1.24)
GFR, Estimated: >60 mL/min (ref >60)
Glucose, Bld: 260 mg/dL — ABNORMAL HIGH (ref 70–99)
Potassium: 4.4 mmol/L (ref 3.5–5.1)
Sodium: 136 mmol/L (ref 135–145)

## 2023-02-05 LAB — CEA (ACCESS): CEA (CHCC): 6.9 ng/mL — ABNORMAL HIGH (ref 0.00–5.00)

## 2023-02-05 MED ORDER — IOHEXOL 300 MG/ML  SOLN
100.0000 mL | Freq: Once | INTRAMUSCULAR | Status: AC | PRN
  Administered 2023-02-05: 85 mL via INTRAVENOUS

## 2023-02-05 NOTE — Therapy (Signed)
OUTPATIENT PHYSICAL THERAPY UPPER EXTREMITY EVALUATION   Patient Name: Wyatt Meyer MRN: [id number] DOB:Sep 26, 1960, 62 y.o., male Today's Date: 02/05/2023  END OF SESSION:  PT End of Session - 02/05/23 1152     Visit Number 1    Date for PT Re-Evaluation 04/02/23    Authorization Type BCBS    PT Start Time 1152    PT Stop Time 1238    PT Time Calculation (min) 46 min    Activity Tolerance Patient tolerated treatment well    Behavior During Therapy [facility] for tasks assessed/performed             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/23/2016   Hyperlipidemia    on meds   Iron deficiency anemia    on IRON and Geritol   Kidney stone    Klebsiella sepsis (HCC) 03/25/2015   Osteoarthritis 08/11/2012   Peripheral neuropathy 08/11/2012   Prostate cancer (HCC)    Smoker    SUI (stress urinary incontinence), male 02/03/2013   Uncontrolled diabetes mellitus with complications 03/23/2015   on meds   Past Surgical History:  Procedure Laterality Date   COLONOSCOPY  01/27/2021   Dr.Beavers   COLONOSCOPY WITH PROPOFOL N/A 06/11/2022   Procedure: COLONOSCOPY WITH PROPOFOL;  Surgeon: Tressia Danas, MD;  Location: [facility] ENDOSCOPY;  Service: Gastroenterology;  Laterality: N/A;   LAPAROSCOPIC RIGHT HEMI COLECTOMY Right 05/03/2021   Procedure: LAPAROSCOPIC RIGHT HEMI COLECTOMY WITH LYSIS OF ADHESIONS AND BILATERAL TAP BLOCK;  Surgeon: Andria Meuse, MD;  Location: [facility] ORS;  Service: General;  Laterality: Right;   PROSTATE SURGERY  09/25/2011   PROSTATE SURGERY     WISDOM TOOTH EXTRACTION     Patient Active Problem List   Diagnosis Date Noted   Pain in right shoulder 02/05/2023   History of colon cancer, stage II    S/P right hemicolectomy 05/03/2021   Colon cancer (HCC) 03/31/2021   Leg edema 04/19/2020   Vitamin B12 deficiency 07/23/2017   Type 2 diabetes mellitus with hyperglycemia (HCC)  02/15/2017   Smoker    Erectile dysfunction following radical prostatectomy 10/23/2016   Hyperlipidemia 03/23/2015   History of prostate cancer 03/23/2015   SUI (stress urinary incontinence), male 02/03/2013   Osteoarthritis 08/11/2012   Diabetic neuropathy (HCC) 08/11/2012    PCP: Ardith Dark, MD  REFERRING PROVIDER: Ardith Dark, MD  REFERRING DIAG: M25.511 (ICD-10-CM) - Acute pain of right shoulder  THERAPY DIAG:  Acute pain of right shoulder  Stiffness of right shoulder, not elsewhere classified  Muscle weakness (generalized)  Cramp and spasm  Rationale for Evaluation and Treatment: Rehabilitation  ONSET DATE: 01/28/2023  SUBJECTIVE:  SUBJECTIVE STATEMENT: Patient reports ongoing pain in right shoulder for a couple of months Hand dominance: Right  PERTINENT HISTORY: Recent colon surgery for colon cancer  PAIN:  Are you having pain?  2/10 now ,  at worst 7-8/10  PRECAUTIONS: None  WEIGHT BEARING RESTRICTIONS: No  FALLS:  Has patient fallen in last 6 months? No  LIVING ENVIRONMENT: Lives with: lives alone Lives in: House/apartment   OCCUPATION: Engineering/computer work  PLOF: Independent, Independent with basic ADLs, Independent with household mobility without device, Independent with community mobility without device, Independent with homemaking with ambulation, Independent with gait, and Independent with transfers  PATIENT GOALS: Avoiding any chance of joint issues in the event he is diagnosed with RA.  This runs in his family.   NEXT MD VISIT: prn  OBJECTIVE:   DIAGNOSTIC FINDINGS:  na  PATIENT SURVEYS :  FOTO 60, predicted  COGNITION: Overall cognitive status: Within functional limits for tasks assessed     SENSATION: Patient reports some radicular  symptoms into the right hand along ulnar nerve distribution  POSTURE: Rounded shoulders fwd head  UPPER EXTREMITY ROM:   All WNL with painful arc  UPPER EXTREMITY MMT:  All 4+/5 with exception of right shoulder ER and scaption with IR which is  4-/5  SHOULDER SPECIAL TESTS: Impingement tests: Hawkins/Kennedy impingement test: positive  and Painful arc test: positive  SLAP lesions: Biceps load test: positive  Instability tests: Apprehension test: negative Rotator cuff assessment: Drop arm test: negative and Empty can test: negative Biceps assessment: Speed's test: positive   JOINT MOBILITY TESTING:  No significant laxity or restriction  PALPATION:  Tender at posterior shoulder   TODAY'S TREATMENT:                                                                                                                                         DATE: 02/05/23 Initial evaluation completed Initiated HEP  PATIENT EDUCATION: Education details: Initiated HEP, educated on pain Person educated: Patient Education method: Programmer, multimedia, Facilities manager, Verbal cues, and Handouts Education comprehension: verbalized understanding  HOME EXERCISE PROGRAM: Access Code: [email or web address] URL: [email or web address] Date: 02/05/2023 Prepared by: Mikey Kirschner  Exercises - Prone Shoulder Extension - Single Arm  - 2 x daily - 7 x weekly - 2 sets - 10 reps - Prone Shoulder Row  - 2 x daily - 7 x weekly - 2 sets - 10 reps - Prone Single Arm Shoulder Horizontal Abduction with Scapular Retraction and Palm Down  - 2 x daily - 7 x weekly - 2 sets - 10 reps - Sidelying Shoulder External Rotation  - 2 x daily - 7 x weekly - 2 sets - 10 reps - Single Arm Serratus Punches in Supine with Dumbbell  - 2 x daily - 7 x weekly - 2 sets - 10 reps - Standing Shoulder Flexion to 90 Degrees with Dumbbells  - 2 x daily -  7 x weekly - 2 sets - 10 reps - Standing Shoulder Scaption  - 2 x daily - 7 x weekly - 2 sets - 10  reps  ASSESSMENT:  CLINICAL IMPRESSION: Patient is a 62 y.o. male who was seen today for physical therapy evaluation and treatment for right shoulder pain.  Symptoms are consistent with right shoulder impingement.  He presents with decreased strength in right shoulder ER and scaption with IR.  He would respond well to rotator cuff strengthening, scapular stabilization and modalities for pain relief and possibly dry needling for trigger points and taut bands in the neck and shoulders.     OBJECTIVE IMPAIRMENTS: decreased ROM, decreased strength, increased fascial restrictions, increased muscle spasms, impaired flexibility, impaired sensation, impaired UE functional use, postural dysfunction, and pain.   ACTIVITY LIMITATIONS: carrying, lifting, sleeping, transfers, bed mobility, dressing, reach over head, and hygiene/grooming  PARTICIPATION LIMITATIONS: meal prep, cleaning, laundry, driving, shopping, community activity, occupation, and yard work  PERSONAL FACTORS: Age, Behavior pattern, and Fitness are also affecting patient's functional outcome.   REHAB POTENTIAL: Good  CLINICAL DECISION MAKING: Stable/uncomplicated  EVALUATION COMPLEXITY: Low  GOALS: Goals reviewed with patient? Yes  SHORT TERM GOALS: Target date: 03/05/2023   Pain report to be no greater than 4/10  Baseline: Goal status: INITIAL  2.  Patient will be independent with initial HEP  Baseline:  Goal status: INITIAL   LONG TERM GOALS: Target date: 04/02/2023   Patient to be independent with advanced HEP  Baseline:  Goal status: INITIAL  2.  Patient to report pain no greater than 2/10  Baseline:  Goal status: INITIAL  3.  Patient to be able to sleep through the night  Baseline:  Goal status: INITIAL  4.  Patient to be able to reach overhead into cabinets and on top of shelves without shoulder pain  Baseline:  Goal status: INITIAL  5.  Patient to report 85% improvement in overall symptoms  Baseline:   Goal status: INITIAL   PLAN: PT FREQUENCY: 2x/week  PT DURATION: 8 weeks  PLANNED INTERVENTIONS: Therapeutic exercises, Therapeutic activity, Neuromuscular re-education, Patient/Family education, Self Care, Joint mobilization, Aquatic Therapy, Dry Needling, Electrical stimulation, Cryotherapy, Moist heat, Taping, Vasopneumatic device, Ultrasound, Ionotophoresis 4mg /ml Dexamethasone, Manual therapy, and Re-evaluation  PLAN FOR NEXT SESSION: UBE, review HEP, add scapular stabilization   Salvatore Poe B. Nevaan Bunton, PT 02/05/23 8:42 PM  [facility] [address], [address] [place], [place] [address] Phone # [phone] Fax [phone]

## 2023-02-07 ENCOUNTER — Ambulatory Visit: Payer: BC Managed Care – PPO | Admitting: Rehabilitative and Restorative Service Providers"

## 2023-02-07 ENCOUNTER — Telehealth: Payer: Self-pay

## 2023-02-07 NOTE — Telephone Encounter (Signed)
-----   Message from Ladene Artist, MD sent at 02/07/2023 11:53 AM EDT ----- Please call patient, CTs are negative for recurrent cancer, f/u as scheduled

## 2023-02-07 NOTE — Telephone Encounter (Signed)
Patient gave verbal understanding and had no further questions or concerns at this time 

## 2023-02-08 ENCOUNTER — Ambulatory Visit: Payer: BC Managed Care – PPO | Admitting: Oncology

## 2023-02-12 ENCOUNTER — Ambulatory Visit: Payer: BC Managed Care – PPO | Admitting: Rehabilitative and Restorative Service Providers"

## 2023-02-12 ENCOUNTER — Encounter: Payer: Self-pay | Admitting: Rehabilitative and Restorative Service Providers"

## 2023-02-12 DIAGNOSIS — M25511 Pain in right shoulder: Secondary | ICD-10-CM

## 2023-02-12 DIAGNOSIS — M25611 Stiffness of right shoulder, not elsewhere classified: Secondary | ICD-10-CM

## 2023-02-12 DIAGNOSIS — M6281 Muscle weakness (generalized): Secondary | ICD-10-CM

## 2023-02-12 DIAGNOSIS — R252 Cramp and spasm: Secondary | ICD-10-CM

## 2023-02-12 NOTE — Therapy (Signed)
OUTPATIENT PHYSICAL THERAPY TREATMENT NOTE   Patient Name: Wyatt Meyer MRN: [id number] DOB:[birth year], 62 y.o., male Today's Date: 02/12/2023  END OF SESSION:  PT End of Session - 02/12/23 0732     Visit Number 2    Date for PT Re-Evaluation 04/02/23    Authorization Type BCBS    PT Start Time 0730    PT Stop Time 0800    PT Time Calculation (min) 30 min    Activity Tolerance Patient tolerated treatment well    Behavior During Therapy [facility] for tasks assessed/performed             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/23/2016   Hyperlipidemia    on meds   Iron deficiency anemia    on IRON and Geritol   Kidney stone    Klebsiella sepsis (HCC) 03/25/2015   Osteoarthritis 08/11/2012   Peripheral neuropathy 08/11/2012   Prostate cancer (HCC)    Smoker    SUI (stress urinary incontinence), male 02/03/2013   Uncontrolled diabetes mellitus with complications 03/23/2015   on meds   Past Surgical History:  Procedure Laterality Date   COLONOSCOPY  01/27/2021   Dr.Beavers   COLONOSCOPY WITH PROPOFOL N/A 06/11/2022   Procedure: COLONOSCOPY WITH PROPOFOL;  Surgeon: Tressia Danas, MD;  Location: [facility] ENDOSCOPY;  Service: Gastroenterology;  Laterality: N/A;   LAPAROSCOPIC RIGHT HEMI COLECTOMY Right 05/03/2021   Procedure: LAPAROSCOPIC RIGHT HEMI COLECTOMY WITH LYSIS OF ADHESIONS AND BILATERAL TAP BLOCK;  Surgeon: Andria Meuse, MD;  Location: [facility] ORS;  Service: General;  Laterality: Right;   PROSTATE SURGERY  09/25/2011   PROSTATE SURGERY     WISDOM TOOTH EXTRACTION     Patient Active Problem List   Diagnosis Date Noted   Pain in right shoulder 02/05/2023   History of colon cancer, stage II    S/P right hemicolectomy 05/03/2021   Colon cancer (HCC) 03/31/2021   Leg edema 04/19/2020   Vitamin B12 deficiency 07/23/2017   Type 2 diabetes mellitus with hyperglycemia (HCC) 02/15/2017    Smoker    Erectile dysfunction following radical prostatectomy 10/23/2016   Hyperlipidemia 03/23/2015   History of prostate cancer 03/23/2015   SUI (stress urinary incontinence), male 02/03/2013   Osteoarthritis 08/11/2012   Diabetic neuropathy (HCC) 08/11/2012    PCP: Ardith Dark, MD  REFERRING PROVIDER: Ardith Dark, MD  REFERRING DIAG: M25.511 (ICD-10-CM) - Acute pain of right shoulder  THERAPY DIAG:  Acute pain of right shoulder  Stiffness of right shoulder, not elsewhere classified  Muscle weakness (generalized)  Cramp and spasm  Rationale for Evaluation and Treatment: Rehabilitation  ONSET DATE: 01/28/2023  SUBJECTIVE:  SUBJECTIVE STATEMENT: Patient reports some pain over the weekend when he was doing his yard work.  States pain occasionally briefly increases to 7-8/10 at times.  Hand dominance: Right  PERTINENT HISTORY: Recent colon surgery for colon cancer  PAIN:  Are you having pain? Yes: NPRS scale: 2-3/10 Pain location: right shoulder Pain description: intermittent pain, radiating down right UE Aggravating factors: certain movements Relieving factors: rest  PRECAUTIONS: None  WEIGHT BEARING RESTRICTIONS: No  FALLS:  Has patient fallen in last 6 months? No  LIVING ENVIRONMENT: Lives with: lives alone Lives in: House/apartment   OCCUPATION: Engineering/computer work  PLOF: Independent, Independent with basic ADLs, Independent with household mobility without device, Independent with community mobility without device, Independent with homemaking with ambulation, Independent with gait, and Independent with transfers  PATIENT GOALS: Avoiding any chance of joint issues in the event he is diagnosed with RA.  This runs in his family.   NEXT MD VISIT: prn  OBJECTIVE:    DIAGNOSTIC FINDINGS:  na  PATIENT SURVEYS :  Eval:  FOTO 60, predicted  COGNITION: Overall cognitive status: Within functional limits for tasks assessed     SENSATION: Patient reports some radicular symptoms into the right hand along ulnar nerve distribution  POSTURE: Rounded shoulders fwd head  UPPER EXTREMITY ROM:   All WNL with painful arc  UPPER EXTREMITY MMT:  All 4+/5 with exception of right shoulder ER and scaption with IR which is  4-/5  SHOULDER SPECIAL TESTS: Impingement tests: Hawkins/Kennedy impingement test: positive  and Painful arc test: positive  SLAP lesions: Biceps load test: positive  Instability tests: Apprehension test: negative Rotator cuff assessment: Drop arm test: negative and Empty can test: negative Biceps assessment: Speed's test: positive   JOINT MOBILITY TESTING:  No significant laxity or restriction  PALPATION:  Tender at posterior shoulder   TODAY'S TREATMENT:                                                                                                                                          DATE: 02/12/2023 Review of HEP Standing shoulder flexion and shoulder scaption.  X10 bilat Prone shoulder extension, rows, and horizontal abduction.  X10 RUE Side-lying right shoulder ER x10 Supine right shoulder serratus punch x10 Supine shoulder flexion x10 Seated shoulder ER and horizontal abduction with red tband.  2x10 each Standing right shoulder 4D oscillations with light blue 2# ball x20 each direction Supine cervical retraction 2x10 Manual in sitting (attempted supine, but patient had increased pain) with soft tissue mobilization to cervical paraspinals and upper trap. Education on the benefits of dry needling   DATE: 02/05/23 Initial evaluation completed Initiated HEP  PATIENT EDUCATION: Education details: Initiated HEP, educated on pain Person educated: Patient Education method: Programmer, multimedia, Facilities manager, Verbal cues, and  Handouts Education comprehension: verbalized understanding  HOME EXERCISE PROGRAM: Access Code: [email or web address] URL: [email or web address] Date: 02/05/2023 Prepared by: Mikey Kirschner  Exercises - Prone Shoulder Extension - Single Arm  - 2 x daily - 7 x weekly - 2 sets - 10 reps - Prone Shoulder Row  - 2 x daily - 7 x weekly - 2 sets - 10 reps - Prone Single Arm Shoulder Horizontal Abduction with Scapular Retraction and Palm Down  - 2 x daily - 7 x weekly - 2 sets - 10 reps - Sidelying Shoulder External Rotation  - 2 x daily - 7 x weekly - 2 sets - 10 reps - Single Arm Serratus Punches in Supine with Dumbbell  - 2 x daily - 7 x weekly - 2 sets - 10 reps - Standing Shoulder Flexion to 90 Degrees with Dumbbells  - 2 x daily - 7 x weekly - 2 sets - 10 reps - Standing Shoulder Scaption  - 2 x daily - 7 x weekly - 2 sets - 10 reps  ASSESSMENT:  CLINICAL IMPRESSION: Mr Banfield presents to skilled PT reporting compliance with HEP, but states that he had some brief periods of pain of 7-8/10 due to doing yard work.  Patient with tightness noted with cervical paraspinals, as well as upper trap and right upper quadrant.  Patient would benefit from dry needling, in addition to manual, but he is hesitant at this time.  Educated pt on the benefits and patient verbalized understanding and that he would consider for next visit.  Patient with radicular symptoms down right arm and has a follow up visit with his MD tomorrow, recommended him speak with his MD about work-up on his cervical region to, to rule out potential cervical radiculopathy.  Patient verbalizes understanding.   OBJECTIVE IMPAIRMENTS: decreased ROM, decreased strength, increased fascial restrictions, increased muscle spasms, impaired flexibility, impaired sensation, impaired UE functional use, postural dysfunction, and pain.   ACTIVITY LIMITATIONS: carrying, lifting, sleeping, transfers, bed mobility, dressing, reach over head, and  hygiene/grooming  PARTICIPATION LIMITATIONS: meal prep, cleaning, laundry, driving, shopping, community activity, occupation, and yard work  PERSONAL FACTORS: Age, Behavior pattern, and Fitness are also affecting patient's functional outcome.   REHAB POTENTIAL: Good  CLINICAL DECISION MAKING: Stable/uncomplicated  EVALUATION COMPLEXITY: Low  GOALS: Goals reviewed with patient? Yes  SHORT TERM GOALS: Target date: 03/05/2023   Pain report to be no greater than 4/10  Baseline: Goal status: IN PROGRESS  2.  Patient will be independent with initial HEP  Baseline:  Goal status: IN PROGRESS   LONG TERM GOALS: Target date: 04/02/2023   Patient to be independent with advanced HEP  Baseline:  Goal status: INITIAL  2.  Patient to report pain no greater than 2/10  Baseline:  Goal status: INITIAL  3.  Patient to be able to sleep through the night  Baseline:  Goal status: INITIAL  4.  Patient to be able to reach overhead into cabinets and on top of shelves without shoulder pain  Baseline:  Goal status: INITIAL  5.  Patient to report 85% improvement in overall symptoms  Baseline:  Goal status: INITIAL   PLAN: PT FREQUENCY: 2x/week  PT DURATION: 8 weeks  PLANNED INTERVENTIONS: Therapeutic exercises, Therapeutic activity, Neuromuscular re-education, Patient/Family education, Self Care, Joint mobilization, Aquatic Therapy, Dry Needling, Electrical stimulation, Cryotherapy, Moist heat, Taping, Vasopneumatic device, Ultrasound, Ionotophoresis 4mg /ml Dexamethasone, Manual therapy, and Re-evaluation  PLAN FOR NEXT SESSION: UBE, review HEP, add scapular stabilization, dry needling/manual therapy as indicated   Reather Laurence, PT 02/12/23 8:33 AM   [facility] [address], [address] [place], [place]  [address] Phone # [phone] Fax [phone]

## 2023-02-13 ENCOUNTER — Ambulatory Visit: Payer: BC Managed Care – PPO | Admitting: Family Medicine

## 2023-02-13 VITALS — BP 104/66 | HR 82 | Temp 97.7°F | Ht 73.0 in | Wt 245.2 lb

## 2023-02-13 DIAGNOSIS — E1165 Type 2 diabetes mellitus with hyperglycemia: Secondary | ICD-10-CM | POA: Diagnosis not present

## 2023-02-13 DIAGNOSIS — E1149 Type 2 diabetes mellitus with other diabetic neurological complication: Secondary | ICD-10-CM

## 2023-02-13 DIAGNOSIS — C189 Malignant neoplasm of colon, unspecified: Secondary | ICD-10-CM | POA: Diagnosis not present

## 2023-02-13 DIAGNOSIS — Z794 Long term (current) use of insulin: Secondary | ICD-10-CM

## 2023-02-13 LAB — POCT GLYCOSYLATED HEMOGLOBIN (HGB A1C): Hemoglobin A1C: 11.7 % — AB (ref 4.0–5.6)

## 2023-02-13 LAB — MICROALBUMIN / CREATININE URINE RATIO
Creatinine,U: 351 mg/dL
Microalb Creat Ratio: 22.1 mg/g (ref 0.0–30.0)
Microalb, Ur: 77.7 mg/dL — ABNORMAL HIGH (ref 0.0–1.9)

## 2023-02-13 MED ORDER — TIRZEPATIDE 2.5 MG/0.5ML ~~LOC~~ SOAJ: 2.5000 mg | SUBCUTANEOUS | 0 refills | Status: DC

## 2023-02-13 MED ORDER — TIRZEPATIDE 5 MG/0.5ML ~~LOC~~ SOAJ: 5.0000 mg | SUBCUTANEOUS | 0 refills | Status: DC

## 2023-02-13 NOTE — Assessment & Plan Note (Signed)
A1c 11.7.  We discussed importance of good glycemic control.  He does admit to dietary indiscretions and not being as consistent with his insulin as he should be.  We discussed importance of good glycemic control to prevent complications.  We discussed treatment options.  We will continue Lantus 60 units daily and sliding scale insulin 3 times a day with meals.  He would be a great candidate for GLP-1 agonist and he is agreeable to start one today.  We discussed potential side effects.  Will start Mounjaro 2.5 mg weekly then increase to 5 mg weekly in 4 weeks.  He does have an upcoming appoint with endocrinology in about 4 months however will come back to see [place] in about 3 months to recheck his A1c.

## 2023-02-13 NOTE — Assessment & Plan Note (Signed)
Likely the main source of his lower extremity paresthesias.  We discussed importance of good glycemic control.

## 2023-02-13 NOTE — Patient Instructions (Addendum)
It was very nice to see you today!  Please start the [facility]. Take 2.5 mg weekly for the first 4 weeks and then increase to 5 mg weekly.  Please continue to monitor your sugar at home and let us know if you have any issues with the medication  Return in about 3 months (around 05/16/2023) for Follow Up.   Take care, Dr Jimmey Ralph  PLEASE NOTE:  If you had any lab tests, please let us know if you have not heard back within a few days. You may see your results on mychart before we have a chance to review them but we will give you a call once they are reviewed by [place].   If we ordered any referrals today, please let us know if you have not heard from their office within the next week.   If you had any urgent prescriptions sent in today, please check with the pharmacy within an hour of our visit to make sure the prescription was transmitted appropriately.   Please try these tips to maintain a healthy lifestyle:  Eat at least 3 REAL meals and 1-2 snacks per day.  Aim for no more than 5 hours between eating.  If you eat breakfast, please do so within one hour of getting up.   Each meal should contain half fruits/vegetables, one quarter protein, and one quarter carbs (no bigger than a computer mouse)  Cut down on sweet beverages. This includes juice, soda, and sweet tea.   Drink at least 1 glass of water with each meal and aim for at least 8 glasses per day  Exercise at least 150 minutes every week.

## 2023-02-13 NOTE — Progress Notes (Signed)
   Wyatt Meyer is a 62 y.o. male who presents today for an office visit.  Assessment/Plan:  New/Acute Problems: Leg Weakness / Paresthesias Likely secondary to his diabetic neuropathy however we did discuss that with his cancer history it would be reasonable to check imaging to rule out metastatic disease.  He has been following with oncology for ongoing management for his colon cancer and currently does not have any other signs of metastasis.  Recommended checking MRI to evaluated for this, however patient deferred for now and stated that he would like to work on blood sugar control first.  He will let us know if symptoms change.  We discussed reasons to return to care or seek emergent care.  Chronic Problems Addressed Today: Type 2 diabetes mellitus with hyperglycemia (HCC) A1c 11.7.  We discussed importance of good glycemic control.  He does admit to dietary indiscretions and not being as consistent with his insulin as he should be.  We discussed importance of good glycemic control to prevent complications.  We discussed treatment options.  We will continue Lantus 60 units daily and sliding scale insulin 3 times a day with meals.  He would be a great candidate for GLP-1 agonist and he is agreeable to start one today.  We discussed potential side effects.  Will start Mounjaro 2.5 mg weekly then increase to 5 mg weekly in 4 weeks.  He does have an upcoming appoint with endocrinology in about 4 months however will come back to see [place] in about 3 months to recheck his A1c.    Diabetic neuropathy (HCC) Likely the main source of his lower extremity paresthesias.  We discussed importance of good glycemic control.  Colon cancer [facility]) Continue management per GI and oncology.     Subjective:  HPI:  See A/P for status of chronic conditions.  Patient is here for diabetes follow-up.  Last saw him about 4 months ago.  A1c at that time was 12.  He is currently on lantus 60 units daily and humalog 5-15  three times daily as needed. He does admit to occasionally missing his dose of insulin and a few dietary indiscretions.  Home sugars typically are in the mid 100s however does occasionally have readings into the 300s and 400s.  He has also noticed increased weakness in both of his legs. Feel like he is more unsteady when walking. No falls. This has been going on for a couple of years but seems to be getting worse. Has paresthesias in his legs that are at baseline.        Objective:  Physical Exam: BP 104/66   Pulse 82   Temp 97.7 F (36.5 C) (Temporal)   Ht 6\' 1"  (1.854 m)   Wt 245 lb 3.2 oz (111.2 kg)   SpO2 100%   BMI 32.35 kg/m   Gen: No acute distress, resting comfortably Neuro: Cranial nerves II through XII intact.  Sensation grossly diminished in the lower extremities.  Normal gait observed.  Strength 5 out of 5 throughout. Psych: Normal affect and thought content      Fadia Marlar M. Jimmey Ralph, MD 02/13/2023 8:24 AM

## 2023-02-13 NOTE — Assessment & Plan Note (Signed)
Continue management per GI and oncology.

## 2023-02-14 ENCOUNTER — Inpatient Hospital Stay: Payer: BC Managed Care – PPO | Admitting: Oncology

## 2023-02-14 VITALS — BP 132/78 | HR 80 | Temp 98.1°F | Resp 18 | Ht 73.0 in | Wt 243.6 lb

## 2023-02-14 DIAGNOSIS — C182 Malignant neoplasm of ascending colon: Secondary | ICD-10-CM | POA: Diagnosis not present

## 2023-02-14 DIAGNOSIS — Z85118 Personal history of other malignant neoplasm of bronchus and lung: Secondary | ICD-10-CM | POA: Diagnosis not present

## 2023-02-14 NOTE — Progress Notes (Signed)
Urine test is normal.  Wyatt Contino M. Jimmey Ralph, MD 02/14/2023 1:12 PM

## 2023-02-14 NOTE — Progress Notes (Signed)
[facility] OFFICE PROGRESS NOTE   Diagnosis: Colon cancer  INTERVAL HISTORY:   Wyatt Meyer returns as scheduled.  He feels well.  Good appetite.  No difficulty with bowel function.  No bleeding.  He is followed by orthopedics for treatment of "arthritis "in the right shoulder.  He is undergoing physical therapy.  He smokes approximately 5 cigarettes/day and reports smoking for years.  He has been able to quit in the past.  Objective:  Vital signs in last 24 hours:  Blood pressure 132/78, pulse 80, temperature 98.1 F (36.7 C), resp. rate 18, height 6\' 1"  (1.854 m), weight 243 lb 9.6 oz (110.5 kg), SpO2 100 %.     Lymphatics: No cervical, supraclavicular, axillary, or inguinal nodes Resp: Lungs clear bilaterally Cardio: Regular rate and rhythm GI: No hepatosplenomegaly, no mass, nontender Vascular: No leg edema   Lab Results:  Lab Results  Component Value Date   WBC 6.3 05/16/2022   HGB 12.6 (L) 05/16/2022   HCT 39.2 05/16/2022   MCV 95.6 05/16/2022   PLT 229 05/16/2022   NEUTROABS 4.4 05/16/2022    CMP  Lab Results  Component Value Date   NA 136 02/05/2023   K 4.4 02/05/2023   CL 103 02/05/2023   CO2 26 02/05/2023   GLUCOSE 260 (H) 02/05/2023   BUN 22 02/05/2023   CREATININE 1.29 (H) 02/05/2023   CALCIUM 9.1 02/05/2023   PROT 7.4 12/11/2021   ALBUMIN 3.8 12/11/2021   AST 24 12/11/2021   ALT 20 12/11/2021   ALKPHOS 111 12/11/2021   BILITOT 0.4 12/11/2021   GFRNONAA >60 02/05/2023   GFRAA 84 04/04/2016    Lab Results  Component Value Date   CEA 6.90 (H) 02/05/2023     Medications: I have reviewed the patient's current medications.   Assessment/Plan:  Colon cancer, cecum, stage IIb (T4a N0 M0), status post a laparoscopic right colectomy 05/03/2021 0/19 lymph nodes, no perineural or lymphovascular invasion, negative resection margins, no tumor deposits, tumor extends into subserosal connective tissue with microscopic involvement of the  serosa, MSS, no loss of mismatch repair protein expression CT the abdomen/pelvis 02/03/2021-no bowel obstruction, no adenopathy, probable fatty liver CT chest 02/14/2021-2 mm right middle lobe and 6 mm left upper lobe fissural nodule, nonspecific Elevated preoperative CEA, repeat CEA 06/05/2021-4.3 Guardant Reveal 06/05/2021-negative Cycle 1 adjuvant Xeloda 06/05/2021 Cycle 2 Xeloda 06/26/2021--reported he was still taking Xeloda 07/14/2021, instructed to stop further Xeloda with the plan to resume with cycle 3 on 07/24/2021 Cycle 3 Xeloda 07/24/2021 Cycle 4 Xeloda 08/14/2021-reported he was still taking Xeloda 08/31/2021, instructed to stop further Xeloda with the plan to resume with cycle 5 on 09/08/2021 Guardant reveal 08/31/2021-negative Cycle 5 Xeloda 09/08/2021 Xeloda placed on hold due to hand-foot syndrome 09/28/2021 Cycle 6 Xeloda resumed 10/16/2021, dose reduced to 1000 mg twice daily for 14 days/7 day break Cycle 7 Xeloda 11/20/2021 Cycle 8 Xeloda 12/11/2021 CTs 02/12/2022-no evidence of recurrent disease, stable tiny bilateral pulmonary nodules-benign Colonoscopy 06/11/2022-diverticulosis in the sigmoid colon CTs 02/05/2023-no evidence of recurrent disease, stable tiny pulmonary nodules   Iron deficiency anemia secondary to #1 Diabetes Peripheral neuropathy History of prostate cancer, status post prostatectomy 08/29/2012, pT2c, N0, MX, Gleason 7 Hand/foot syndrome-Xeloda placed on hold 09/28/2021, improved Chronic mild elevation of the CEA     Disposition: Wyatt Meyer is in clinical remission from colon cancer.  The CEA has been mildly elevated the past year.  He underwent a surveillance colonoscopy in September 2023 and he underwent  surveillance CTs last week.  I suspect the elevated CEA is related to smoking.  He says he can discontinue smoking.  He will stop smoking and return for a CEA in 1 month.  Wyatt Meyer will return for an office visit and CEA in 6 months. Thornton Papas,  MD  02/14/2023  8:14 AM

## 2023-02-19 ENCOUNTER — Ambulatory Visit: Payer: BC Managed Care – PPO

## 2023-02-19 DIAGNOSIS — M25511 Pain in right shoulder: Secondary | ICD-10-CM | POA: Diagnosis not present

## 2023-02-19 DIAGNOSIS — M6281 Muscle weakness (generalized): Secondary | ICD-10-CM

## 2023-02-19 DIAGNOSIS — M25611 Stiffness of right shoulder, not elsewhere classified: Secondary | ICD-10-CM

## 2023-02-19 DIAGNOSIS — R252 Cramp and spasm: Secondary | ICD-10-CM

## 2023-02-19 NOTE — Therapy (Signed)
OUTPATIENT PHYSICAL THERAPY TREATMENT NOTE   Patient Name: Wyatt Meyer MRN: [id number] DOB:05/16/1961, 62 y.o., male Today's Date: 02/19/2023  END OF SESSION:  PT End of Session - 02/19/23 0811     Visit Number 3    Date for PT Re-Evaluation 04/02/23    Authorization Type BCBS    PT Start Time 0729    PT Stop Time 0805    PT Time Calculation (min) 36 min    Activity Tolerance Patient tolerated treatment well    Behavior During Therapy [facility] for tasks assessed/performed              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/23/2016   Hyperlipidemia    on meds   Iron deficiency anemia    on IRON and Geritol   Kidney stone    Klebsiella sepsis (HCC) 03/25/2015   Osteoarthritis 08/11/2012   Peripheral neuropathy 08/11/2012   Prostate cancer (HCC)    Smoker    SUI (stress urinary incontinence), male 02/03/2013   Uncontrolled diabetes mellitus with complications 03/23/2015   on meds   Past Surgical History:  Procedure Laterality Date   COLONOSCOPY  01/27/2021   Dr.Beavers   COLONOSCOPY WITH PROPOFOL N/A 06/11/2022   Procedure: COLONOSCOPY WITH PROPOFOL;  Surgeon: Tressia Danas, MD;  Location: [facility] ENDOSCOPY;  Service: Gastroenterology;  Laterality: N/A;   LAPAROSCOPIC RIGHT HEMI COLECTOMY Right 05/03/2021   Procedure: LAPAROSCOPIC RIGHT HEMI COLECTOMY WITH LYSIS OF ADHESIONS AND BILATERAL TAP BLOCK;  Surgeon: Andria Meuse, MD;  Location: [facility];  Service: General;  Laterality: Right;   PROSTATE SURGERY  09/25/2011   PROSTATE SURGERY     WISDOM TOOTH EXTRACTION     Patient Active Problem List   Diagnosis Date Noted   Pain in right shoulder 02/05/2023   History of colon cancer, stage II    S/P right hemicolectomy 05/03/2021   Colon cancer (HCC) 03/31/2021   Leg edema 04/19/2020   Vitamin B12 deficiency 07/23/2017   Type 2 diabetes mellitus with hyperglycemia (HCC) 02/15/2017    Smoker    Erectile dysfunction following radical prostatectomy 10/23/2016   Hyperlipidemia 03/23/2015   History of prostate cancer 03/23/2015   SUI (stress urinary incontinence), male 02/03/2013   Osteoarthritis 08/11/2012   Diabetic neuropathy (HCC) 08/11/2012    PCP: Ardith Dark, MD  REFERRING PROVIDER: Ardith Dark, MD  REFERRING DIAG: M25.511 (ICD-10-CM) - Acute pain of right shoulder  THERAPY DIAG:  Acute pain of right shoulder  Stiffness of right shoulder, not elsewhere classified  Muscle weakness (generalized)  Cramp and spasm  Rationale for Evaluation and Treatment: Rehabilitation  ONSET DATE: 01/28/2023  SUBJECTIVE:  SUBJECTIVE STATEMENT: I am doing my exercises sometimes.  I had pain yesterday but today I'm good.  I saw the MD last week and I dont remember what he said about a scan for my neck.    Hand dominance: Right  PERTINENT HISTORY: Recent colon surgery for colon cancer  PAIN:  Are you having pain? Yes: NPRS scale: 0/10 Pain location: right shoulder Pain description: intermittent pain, radiating down right UE Aggravating factors: certain movements Relieving factors: rest  PRECAUTIONS: None  WEIGHT BEARING RESTRICTIONS: No  FALLS:  Has patient fallen in last 6 months? No  LIVING ENVIRONMENT: Lives with: lives alone Lives in: House/apartment   OCCUPATION: Engineering/computer work  PLOF: Independent, Independent with basic ADLs, Independent with household mobility without device, Independent with community mobility without device, Independent with homemaking with ambulation, Independent with gait, and Independent with transfers  PATIENT GOALS: Avoiding any chance of joint issues in the event he is diagnosed with RA.  This runs in his family.   NEXT MD  VISIT: prn  OBJECTIVE:   DIAGNOSTIC FINDINGS:  na  PATIENT SURVEYS :  Eval:  FOTO 60, predicted  COGNITION: Overall cognitive status: Within functional limits for tasks assessed     SENSATION: Patient reports some radicular symptoms into the right hand along ulnar nerve distribution  POSTURE: Rounded shoulders fwd head  UPPER EXTREMITY ROM:   All WNL with painful arc  UPPER EXTREMITY MMT: All 4+/5 with exception of right shoulder ER and scaption with IR which is  4-/5  SHOULDER SPECIAL TESTS: Impingement tests: Hawkins/Kennedy impingement test: positive  and Painful arc test: positive  SLAP lesions: Biceps load test: positive  Instability tests: Apprehension test: negative Rotator cuff assessment: Drop arm test: negative and Empty can test: negative Biceps assessment: Speed's test: positive   JOINT MOBILITY TESTING:  No significant laxity or restriction  PALPATION:  Tender at posterior shoulder   TODAY'S TREATMENT:                                                                                                                                         DATE: 02/19/2023 Arm Bike: level 1.5 x 6 min (3/3)- PT present to discuss Standing shoulder flexion and scaption  2 x10 each on Rt with 1# weight Upper trap stretch 3x20 seconds  Open book x10 each Seated shoulder ER and horizontal abduction with red tband.  2x10 each Supine Rt shoulder flexion 1# 2x10 Manual in sitting: Addaday to Rt upper trap, rhomboids and neck  DATE: 02/12/2023 Review of HEP Standing shoulder flexion and shoulder scaption.  X10 bilat Prone shoulder extension, rows, and horizontal abduction.  X10 RUE Side-lying right shoulder ER x10 Supine right shoulder serratus punch x10 Supine shoulder flexion x10 Seated shoulder ER and horizontal abduction with red tband.  2x10 each Standing right shoulder 4D oscillations with light blue 2# ball x20 each direction Supine cervical retraction  2x10 Manual in  sitting (attempted supine, but patient had increased pain) with soft tissue mobilization to cervical paraspinals and upper trap. Education on the benefits of dry needling   DATE: 02/05/23 Initial evaluation completed Initiated HEP  PATIENT EDUCATION: Education details: Initiated HEP, educated on pain Person educated: Patient Education method: Programmer, multimedia, Facilities manager, Verbal cues, and Handouts Education comprehension: verbalized understanding  HOME EXERCISE PROGRAM: Access Code: [email or web address] URL: [email or web address] Date: 02/05/2023 Prepared by: Mikey Kirschner  Exercises - Prone Shoulder Extension - Single Arm  - 2 x daily - 7 x weekly - 2 sets - 10 reps - Prone Shoulder Row  - 2 x daily - 7 x weekly - 2 sets - 10 reps - Prone Single Arm Shoulder Horizontal Abduction with Scapular Retraction and Palm Down  - 2 x daily - 7 x weekly - 2 sets - 10 reps - Sidelying Shoulder External Rotation  - 2 x daily - 7 x weekly - 2 sets - 10 reps - Single Arm Serratus Punches in Supine with Dumbbell  - 2 x daily - 7 x weekly - 2 sets - 10 reps - Standing Shoulder Flexion to 90 Degrees with Dumbbells  - 2 x daily - 7 x weekly - 2 sets - 10 reps - Standing Shoulder Scaption  - 2 x daily - 7 x weekly - 2 sets - 10 reps  ASSESSMENT:  CLINICAL IMPRESSION: Pt saw his MD last week and didn't discuss his neck and arm pain.  He will call MD soon to discuss imaging.  Pt continues to have intermittent pain in the Rt arm and Rt upper traps.  PT provided him with postural education and he required frequent cueing for scapular relaxation and upright posture.  Pt tolerated all exercises in the clinic without increased pain.  Pt responded well to addaday to Rt neck.  Patient will benefit from skilled PT to address the below impairments and improve overall function.    OBJECTIVE IMPAIRMENTS: decreased ROM, decreased strength, increased fascial restrictions, increased muscle spasms, impaired  flexibility, impaired sensation, impaired UE functional use, postural dysfunction, and pain.   ACTIVITY LIMITATIONS: carrying, lifting, sleeping, transfers, bed mobility, dressing, reach over head, and hygiene/grooming  PARTICIPATION LIMITATIONS: meal prep, cleaning, laundry, driving, shopping, community activity, occupation, and yard work  PERSONAL FACTORS: Age, Behavior pattern, and Fitness are also affecting patient's functional outcome.   REHAB POTENTIAL: Good  CLINICAL DECISION MAKING: Stable/uncomplicated  EVALUATION COMPLEXITY: Low  GOALS: Goals reviewed with patient? Yes  SHORT TERM GOALS: Target date: 03/05/2023   Pain report to be no greater than 4/10  Baseline: Goal status: IN PROGRESS  2.  Patient will be independent with initial HEP  Baseline:  Goal status: IN PROGRESS   LONG TERM GOALS: Target date: 04/02/2023   Patient to be independent with advanced HEP  Baseline:  Goal status: INITIAL  2.  Patient to report pain no greater than 2/10  Baseline:  Goal status: INITIAL  3.  Patient to be able to sleep through the night  Baseline:  Goal status: INITIAL  4.  Patient to be able to reach overhead into cabinets and on top of shelves without shoulder pain  Baseline:  Goal status: INITIAL  5.  Patient to report 85% improvement in overall symptoms  Baseline:  Goal status: INITIAL   PLAN: PT FREQUENCY: 2x/week  PT DURATION: 8 weeks  PLANNED INTERVENTIONS: Therapeutic exercises, Therapeutic activity, Neuromuscular re-education, Patient/Family education, Self Care, Joint mobilization, Aquatic Therapy, Dry Needling, Electrical stimulation,  Cryotherapy, Moist heat, Taping, Vasopneumatic device, Ultrasound, Ionotophoresis 4mg /ml Dexamethasone, Manual therapy, and Re-evaluation  PLAN FOR NEXT SESSION: UBE, review HEP, add scapular stabilization, dry needling/manual therapy as indicated  Lorrene Reid, PT 02/19/23 8:13 AM   [facility] [address] [place], [place] [address] Phone # [phone] Fax [phone]

## 2023-02-21 ENCOUNTER — Ambulatory Visit: Payer: BC Managed Care – PPO

## 2023-02-21 DIAGNOSIS — R252 Cramp and spasm: Secondary | ICD-10-CM

## 2023-02-21 DIAGNOSIS — M25611 Stiffness of right shoulder, not elsewhere classified: Secondary | ICD-10-CM

## 2023-02-21 DIAGNOSIS — M25511 Pain in right shoulder: Secondary | ICD-10-CM | POA: Diagnosis not present

## 2023-02-21 DIAGNOSIS — M6281 Muscle weakness (generalized): Secondary | ICD-10-CM

## 2023-02-21 NOTE — Therapy (Signed)
OUTPATIENT PHYSICAL THERAPY TREATMENT NOTE   Patient Name: Wyatt Meyer MRN: [id number] DOB:1960-11-23, 62 y.o., male Today's Date: 02/21/2023  END OF SESSION:  PT End of Session - 02/21/23 1619     Visit Number 4    Date for PT Re-Evaluation 04/02/23    Authorization Type BCBS    PT Start Time 1615    PT Stop Time 1703    PT Time Calculation (min) 48 min    Activity Tolerance Patient tolerated treatment well    Behavior During Therapy [facility] for tasks assessed/performed              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/23/2016   Hyperlipidemia    on meds   Iron deficiency anemia    on IRON and Geritol   Kidney stone    Klebsiella sepsis (HCC) 03/25/2015   Osteoarthritis 08/11/2012   Peripheral neuropathy 08/11/2012   Prostate cancer (HCC)    Smoker    SUI (stress urinary incontinence), male 02/03/2013   Uncontrolled diabetes mellitus with complications 03/23/2015   on meds   Past Surgical History:  Procedure Laterality Date   COLONOSCOPY  01/27/2021   Dr.Beavers   COLONOSCOPY WITH PROPOFOL N/A 06/11/2022   Procedure: COLONOSCOPY WITH PROPOFOL;  Surgeon: Tressia Danas, MD;  Location: [facility] ENDOSCOPY;  Service: Gastroenterology;  Laterality: N/A;   LAPAROSCOPIC RIGHT HEMI COLECTOMY Right 05/03/2021   Procedure: LAPAROSCOPIC RIGHT HEMI COLECTOMY WITH LYSIS OF ADHESIONS AND BILATERAL TAP BLOCK;  Surgeon: Andria Meuse, MD;  Location: [facility];  Service: General;  Laterality: Right;   PROSTATE SURGERY  09/25/2011   PROSTATE SURGERY     WISDOM TOOTH EXTRACTION     Patient Active Problem List   Diagnosis Date Noted   Pain in right shoulder 02/05/2023   History of colon cancer, stage II    S/P right hemicolectomy 05/03/2021   Colon cancer (HCC) 03/31/2021   Leg edema 04/19/2020   Vitamin B12 deficiency 07/23/2017   Type 2 diabetes mellitus with hyperglycemia (HCC) 02/15/2017    Smoker    Erectile dysfunction following radical prostatectomy 10/23/2016   Hyperlipidemia 03/23/2015   History of prostate cancer 03/23/2015   SUI (stress urinary incontinence), male 02/03/2013   Osteoarthritis 08/11/2012   Diabetic neuropathy (HCC) 08/11/2012    PCP: Ardith Dark, MD  REFERRING PROVIDER: Ardith Dark, MD  REFERRING DIAG: M25.511 (ICD-10-CM) - Acute pain of right shoulder  THERAPY DIAG:  Acute pain of right shoulder  Muscle weakness (generalized)  Stiffness of right shoulder, not elsewhere classified  Cramp and spasm  Rationale for Evaluation and Treatment: Rehabilitation  ONSET DATE: 01/28/2023  SUBJECTIVE:  SUBJECTIVE STATEMENT: Patient reports he is overall better but hurting a little today due to end of day and has been using his arm a lot.    Pain reported at 4/10.    Hand dominance: Right  PERTINENT HISTORY: Recent colon surgery for colon cancer  PAIN:  02/21/23: Are you having pain? Yes: NPRS scale: 4/10 Pain location: right shoulder Pain description: intermittent pain, radiating down right UE Aggravating factors: certain movements Relieving factors: rest  PRECAUTIONS: None  WEIGHT BEARING RESTRICTIONS: No  FALLS:  Has patient fallen in last 6 months? No  LIVING ENVIRONMENT: Lives with: lives alone Lives in: House/apartment   OCCUPATION: Engineering/computer work  PLOF: Independent, Independent with basic ADLs, Independent with household mobility without device, Independent with community mobility without device, Independent with homemaking with ambulation, Independent with gait, and Independent with transfers  PATIENT GOALS: Avoiding any chance of joint issues in the event he is diagnosed with RA.  This runs in his family.   NEXT MD VISIT:  prn  OBJECTIVE:   DIAGNOSTIC FINDINGS:  na  PATIENT SURVEYS :  Eval:  FOTO 60, predicted  COGNITION: Overall cognitive status: Within functional limits for tasks assessed     SENSATION: Patient reports some radicular symptoms into the right hand along ulnar nerve distribution  POSTURE: Rounded shoulders fwd head  UPPER EXTREMITY ROM:   All WNL with painful arc  UPPER EXTREMITY MMT: All 4+/5 with exception of right shoulder ER and scaption with IR which is  4-/5  SHOULDER SPECIAL TESTS: Impingement tests: Hawkins/Kennedy impingement test: positive  and Painful arc test: positive  SLAP lesions: Biceps load test: positive  Instability tests: Apprehension test: negative Rotator cuff assessment: Drop arm test: negative and Empty can test: negative Biceps assessment: Speed's test: positive   JOINT MOBILITY TESTING:  No significant laxity or restriction  PALPATION:  Tender at posterior shoulder   TODAY'S TREATMENT:                                                                                                                                         DATE: 02/21/2023 Arm Bike: level 1.5 x 6 min (3/3)- PT present to discuss 3 way scapular stabilization with blue loop x 10 right 4D ball rolls with light blue plyo ball x 20 each right Standing shoulder flexion and scaption  2 x10 each on Rt with 1# weight (verbal and tactile cues for scapular setting to avoid compensation and subsequently suffering neck pain.   Prone shoulder extension, rows and horizontal abduction x 20 each with 1# Side lying ER right x 20 with 1# Supine serratus punch with 1# x 20 right Supine shoulder alphabet with 1# A-Z Ice to right shoulder x 10 min in supine hook lying with bolster under knees Emphasized importance of follow through with HEP to assess whether he needs further imaging; he is extremely weak, therefore his symptoms would  likely improve if he is consistent with his HEP.  DATE:  02/19/2023 Arm Bike: level 1.5 x 6 min (3/3)- PT present to discuss Standing shoulder flexion and scaption  2 x10 each on Rt with 1# weight Upper trap stretch 3x20 seconds  Open book x10 each Seated shoulder ER and horizontal abduction with red tband.  2x10 each Supine Rt shoulder flexion 1# 2x10 Manual in sitting: Addaday to Rt upper trap, rhomboids and neck  DATE: 02/12/2023 Review of HEP Standing shoulder flexion and shoulder scaption.  X10 bilat Prone shoulder extension, rows, and horizontal abduction.  X10 RUE Side-lying right shoulder ER x10 Supine right shoulder serratus punch x10 Supine shoulder flexion x10 Seated shoulder ER and horizontal abduction with red tband.  2x10 each Standing right shoulder 4D oscillations with light blue 2# ball x20 each direction Supine cervical retraction 2x10 Manual in sitting (attempted supine, but patient had increased pain) with soft tissue mobilization to cervical paraspinals and upper trap. Education on the benefits of dry needling  PATIENT EDUCATION: Education details: Initiated HEP, educated on pain Person educated: Patient Education method: Programmer, multimedia, Facilities manager, Verbal cues, and Handouts Education comprehension: verbalized understanding  HOME EXERCISE PROGRAM: Access Code: [email or web address] URL: [email or web address] Date: 02/05/2023 Prepared by: Mikey Kirschner  Exercises - Prone Shoulder Extension - Single Arm  - 2 x daily - 7 x weekly - 2 sets - 10 reps - Prone Shoulder Row  - 2 x daily - 7 x weekly - 2 sets - 10 reps - Prone Single Arm Shoulder Horizontal Abduction with Scapular Retraction and Palm Down  - 2 x daily - 7 x weekly - 2 sets - 10 reps - Sidelying Shoulder External Rotation  - 2 x daily - 7 x weekly - 2 sets - 10 reps - Single Arm Serratus Punches in Supine with Dumbbell  - 2 x daily - 7 x weekly - 2 sets - 10 reps - Standing Shoulder Flexion to 90 Degrees with Dumbbells  - 2 x daily - 7 x weekly - 2 sets  - 10 reps - Standing Shoulder Scaption  - 2 x daily - 7 x weekly - 2 sets - 10 reps  ASSESSMENT:  CLINICAL IMPRESSION: Zakkary has not been as consistent with his HEP as he needs to be to overcome his rotator cuff deficits and scapular weakness.  This is likely causing his neck pain as he demonstrates significant compensation with upper trap and levator on standing shoulder flexion and scaption.  He would likely respond very well to DN but he has a fear of needles.  We discussed this with him again today and he will consider further.  He demonstrates generally poor fitness, fatiguing very easily with most tasks and unable to breathe well in prone, profuse sweating.  He is likely used to a fairly sedentary lifestyle as his job is at a computer most of the day.  He would benefit from continuing skilled PT for rotator cuff strengthening, scapular stabilization and generalized postural strengthening.     OBJECTIVE IMPAIRMENTS: decreased ROM, decreased strength, increased fascial restrictions, increased muscle spasms, impaired flexibility, impaired sensation, impaired UE functional use, postural dysfunction, and pain.   ACTIVITY LIMITATIONS: carrying, lifting, sleeping, transfers, bed mobility, dressing, reach over head, and hygiene/grooming  PARTICIPATION LIMITATIONS: meal prep, cleaning, laundry, driving, shopping, community activity, occupation, and yard work  PERSONAL FACTORS: Age, Behavior pattern, and Fitness are also affecting patient's functional outcome.   REHAB POTENTIAL: Good  CLINICAL DECISION MAKING: Stable/uncomplicated  EVALUATION COMPLEXITY:  Low  GOALS: Goals reviewed with patient? Yes  SHORT TERM GOALS: Target date: 03/05/2023   Pain report to be no greater than 4/10  Baseline: Goal status: IN PROGRESS  2.  Patient will be independent with initial HEP  Baseline:  Goal status: IN PROGRESS   LONG TERM GOALS: Target date: 04/02/2023   Patient to be independent with  advanced HEP  Baseline:  Goal status: INITIAL  2.  Patient to report pain no greater than 2/10  Baseline:  Goal status: INITIAL  3.  Patient to be able to sleep through the night  Baseline:  Goal status: INITIAL  4.  Patient to be able to reach overhead into cabinets and on top of shelves without shoulder pain  Baseline:  Goal status: INITIAL  5.  Patient to report 85% improvement in overall symptoms  Baseline:  Goal status: INITIAL   PLAN: PT FREQUENCY: 2x/week  PT DURATION: 8 weeks  PLANNED INTERVENTIONS: Therapeutic exercises, Therapeutic activity, Neuromuscular re-education, Patient/Family education, Self Care, Joint mobilization, Aquatic Therapy, Dry Needling, Electrical stimulation, Cryotherapy, Moist heat, Taping, Vasopneumatic device, Ultrasound, Ionotophoresis 4mg /ml Dexamethasone, Manual therapy, and Re-evaluation  PLAN FOR NEXT SESSION: UBE, scapular stability and shoulder strengthening, cspine ROM and upper trap stretching as indicated.  Dry needling/manual therapy as indicated if patient consents.    Victorino Dike B. Leshea Jaggers, PT 02/21/23 5:15 PM [facility] [address], [address] [place], [place] [address] Phone # [phone] Fax [phone]

## 2023-02-26 ENCOUNTER — Ambulatory Visit: Payer: BC Managed Care – PPO | Attending: Family Medicine

## 2023-02-26 DIAGNOSIS — M25611 Stiffness of right shoulder, not elsewhere classified: Secondary | ICD-10-CM | POA: Diagnosis present

## 2023-02-26 DIAGNOSIS — M6281 Muscle weakness (generalized): Secondary | ICD-10-CM | POA: Diagnosis present

## 2023-02-26 DIAGNOSIS — R252 Cramp and spasm: Secondary | ICD-10-CM | POA: Diagnosis present

## 2023-02-26 DIAGNOSIS — M25511 Pain in right shoulder: Secondary | ICD-10-CM | POA: Diagnosis present

## 2023-02-26 NOTE — Therapy (Signed)
OUTPATIENT PHYSICAL THERAPY TREATMENT NOTE   Patient Name: Wyatt Meyer MRN: [id number] DOB:[birth year], 62 y.o., male Today's Date: 02/26/2023  END OF SESSION:  PT End of Session - 02/26/23 0808     Visit Number 5    Date for PT Re-Evaluation 04/02/23    Authorization Type BCBS    PT Start Time 0724    PT Stop Time 0801    PT Time Calculation (min) 37 min    Activity Tolerance Patient tolerated treatment well    Behavior During Therapy [facility] for tasks assessed/performed               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/23/2016   Hyperlipidemia    on meds   Iron deficiency anemia    on IRON and Geritol   Kidney stone    Klebsiella sepsis (HCC) 03/25/2015   Osteoarthritis 08/11/2012   Peripheral neuropathy 08/11/2012   Prostate cancer (HCC)    Smoker    SUI (stress urinary incontinence), male 02/03/2013   Uncontrolled diabetes mellitus with complications 03/23/2015   on meds   Past Surgical History:  Procedure Laterality Date   COLONOSCOPY  01/27/2021   Dr.Beavers   COLONOSCOPY WITH PROPOFOL N/A 06/11/2022   Procedure: COLONOSCOPY WITH PROPOFOL;  Surgeon: Tressia Danas, MD;  Location: [facility] ENDOSCOPY;  Service: Gastroenterology;  Laterality: N/A;   LAPAROSCOPIC RIGHT HEMI COLECTOMY Right 05/03/2021   Procedure: LAPAROSCOPIC RIGHT HEMI COLECTOMY WITH LYSIS OF ADHESIONS AND BILATERAL TAP BLOCK;  Surgeon: Andria Meuse, MD;  Location: [facility];  Service: General;  Laterality: Right;   PROSTATE SURGERY  09/25/2011   PROSTATE SURGERY     WISDOM TOOTH EXTRACTION     Patient Active Problem List   Diagnosis Date Noted   Pain in right shoulder 02/05/2023   History of colon cancer, stage II    S/P right hemicolectomy 05/03/2021   Colon cancer (HCC) 03/31/2021   Leg edema 04/19/2020   Vitamin B12 deficiency 07/23/2017   Type 2 diabetes mellitus with hyperglycemia (HCC) 02/15/2017    Smoker    Erectile dysfunction following radical prostatectomy 10/23/2016   Hyperlipidemia 03/23/2015   History of prostate cancer 03/23/2015   SUI (stress urinary incontinence), male 02/03/2013   Osteoarthritis 08/11/2012   Diabetic neuropathy (HCC) 08/11/2012    PCP: Ardith Dark, MD  REFERRING PROVIDER: Ardith Dark, MD  REFERRING DIAG: M25.511 (ICD-10-CM) - Acute pain of right shoulder  THERAPY DIAG:  Acute pain of right shoulder  Stiffness of right shoulder, not elsewhere classified  Muscle weakness (generalized)  Cramp and spasm  Rationale for Evaluation and Treatment: Rehabilitation  ONSET DATE: 01/28/2023  SUBJECTIVE:  SUBJECTIVE STATEMENT: I'm doing better with exercises.  I usually get 1x/day.  I've had some increased pain over the past few days.    Hand dominance: Right  PERTINENT HISTORY: Recent colon surgery for colon cancer  PAIN:  02/21/23: Are you having pain? Yes: NPRS scale: 3-5/10 Pain location: right shoulder Pain description: intermittent pain, radiating down right UE Aggravating factors: certain movements Relieving factors: rest  PRECAUTIONS: None  WEIGHT BEARING RESTRICTIONS: No  FALLS:  Has patient fallen in last 6 months? No  LIVING ENVIRONMENT: Lives with: lives alone Lives in: House/apartment   OCCUPATION: Engineering/computer work  PLOF: Independent, Independent with basic ADLs, Independent with household mobility without device, Independent with community mobility without device, Independent with homemaking with ambulation, Independent with gait, and Independent with transfers  PATIENT GOALS: Avoiding any chance of joint issues in the event he is diagnosed with RA.  This runs in his family.   NEXT MD VISIT: prn  OBJECTIVE:   DIAGNOSTIC  FINDINGS:  na  PATIENT SURVEYS :  Eval:  FOTO 60, predicted  COGNITION: Overall cognitive status: Within functional limits for tasks assessed     SENSATION: Patient reports some radicular symptoms into the right hand along ulnar nerve distribution  POSTURE: Rounded shoulders fwd head  UPPER EXTREMITY ROM:   All WNL with painful arc  UPPER EXTREMITY MMT: All 4+/5 with exception of right shoulder ER and scaption with IR which is  4-/5  SHOULDER SPECIAL TESTS: Impingement tests: Hawkins/Kennedy impingement test: positive  and Painful arc test: positive  SLAP lesions: Biceps load test: positive  Instability tests: Apprehension test: negative Rotator cuff assessment: Drop arm test: negative and Empty can test: negative Biceps assessment: Speed's test: positive   JOINT MOBILITY TESTING:  No significant laxity or restriction  PALPATION:  Tender at posterior shoulder   TODAY'S TREATMENT:        DATE: 02/26/2023 Arm Bike: level 1.5 x 6 min (3/3)- PT present to discuss 3 way scapular stabilization with blue loop x 10 right 4D ball rolls with light blue plyo ball x 20 each right Trigger Point Dry-Needling  Treatment instructions: Expect mild to moderate muscle soreness. S/S of pneumothorax if dry needled over a lung field, and to seek immediate medical attention should they occur. Patient verbalized understanding of these instructions and education.  Patient Consent Given: Yes Education handout provided: Previously provided Muscles treated: Rt upper trap, rhomboids, posterior deltoid and lats Treatment response/outcome: Utilized skilled palpation to identify trigger points.  During dry needling able to palpate muscle twitch and muscle elongation  Elongation and release to Rt posterior shoulder and upper traps  Skilled palpation and monitoring by PT during dry needling                                                                                                                                      DATE: 02/21/2023 Arm Bike: level 1.5 x  6 min (3/3)- PT present to discuss 3 way scapular stabilization with blue loop x 10 right 4D ball rolls with light blue plyo ball x 20 each right Standing shoulder flexion and scaption  2 x10 each on Rt with 1# weight (verbal and tactile cues for scapular setting to avoid compensation and subsequently suffering neck pain.   Prone shoulder extension, rows and horizontal abduction x 20 each with 1# Side lying ER right x 20 with 1# Supine serratus punch with 1# x 20 right Supine shoulder alphabet with 1# A-Z Ice to right shoulder x 10 min in supine hook lying with bolster under knees Emphasized importance of follow through with HEP to assess whether he needs further imaging; he is extremely weak, therefore his symptoms would likely improve if he is consistent with his HEP.  DATE: 02/19/2023 Arm Bike: level 1.5 x 6 min (3/3)- PT present to discuss Standing shoulder flexion and scaption  2 x10 each on Rt with 1# weight Upper trap stretch 3x20 seconds  Open book x10 each Seated shoulder ER and horizontal abduction with red tband.  2x10 each Supine Rt shoulder flexion 1# 2x10 Manual in sitting: Addaday to Rt upper trap, rhomboids and neck    PATIENT EDUCATION: Education details: Initiated HEP, educated on pain Person educated: Patient Education method: Programmer, multimedia, Facilities manager, Verbal cues, and Handouts Education comprehension: verbalized understanding  HOME EXERCISE PROGRAM: Access Code: [email or web address] URL: [email or web address] Date: 02/05/2023 Prepared by: Mikey Kirschner  Exercises - Prone Shoulder Extension - Single Arm  - 2 x daily - 7 x weekly - 2 sets - 10 reps - Prone Shoulder Row  - 2 x daily - 7 x weekly - 2 sets - 10 reps - Prone Single Arm Shoulder Horizontal Abduction with Scapular Retraction and Palm Down  - 2 x daily - 7 x weekly - 2 sets - 10 reps - Sidelying Shoulder External Rotation  - 2 x daily - 7  x weekly - 2 sets - 10 reps - Single Arm Serratus Punches in Supine with Dumbbell  - 2 x daily - 7 x weekly - 2 sets - 10 reps - Standing Shoulder Flexion to 90 Degrees with Dumbbells  - 2 x daily - 7 x weekly - 2 sets - 10 reps - Standing Shoulder Scaption  - 2 x daily - 7 x weekly - 2 sets - 10 reps  ASSESSMENT:  CLINICAL IMPRESSION: Pt has become more consistent with HEP ~1x/day.  He reports increased pain over the past few days with unknown cause.  Pt was agreeable to DN and had good response to treatment with multiple twitch responses and improved tissue mobility after.  Pt denies pain at the end of session.  PT emphasized importance of HEP compliance after DN for maximized benefit.  He would benefit from continuing skilled PT for rotator cuff strengthening, scapular stabilization and generalized postural strengthening.     OBJECTIVE IMPAIRMENTS: decreased ROM, decreased strength, increased fascial restrictions, increased muscle spasms, impaired flexibility, impaired sensation, impaired UE functional use, postural dysfunction, and pain.   ACTIVITY LIMITATIONS: carrying, lifting, sleeping, transfers, bed mobility, dressing, reach over head, and hygiene/grooming  PARTICIPATION LIMITATIONS: meal prep, cleaning, laundry, driving, shopping, community activity, occupation, and yard work  PERSONAL FACTORS: Age, Behavior pattern, and Fitness are also affecting patient's functional outcome.   REHAB POTENTIAL: Good  CLINICAL DECISION MAKING: Stable/uncomplicated  EVALUATION COMPLEXITY: Low  GOALS: Goals reviewed with patient? Yes  SHORT TERM GOALS: Target date: 03/05/2023   Pain report  to be no greater than 4/10  Baseline: 3-5/10 (02/25/22) Goal status: IN PROGRESS  2.  Patient will be independent with initial HEP  Baseline:  Goal status: MET   LONG TERM GOALS: Target date: 04/02/2023   Patient to be independent with advanced HEP  Baseline:  Goal status: INITIAL  2.  Patient to  report pain no greater than 2/10  Baseline:  Goal status: INITIAL  3.  Patient to be able to sleep through the night  Baseline:  Goal status: INITIAL  4.  Patient to be able to reach overhead into cabinets and on top of shelves without shoulder pain  Baseline:  Goal status: INITIAL  5.  Patient to report 85% improvement in overall symptoms  Baseline:   Goal status: INITIAL   PLAN: PT FREQUENCY: 2x/week  PT DURATION: 8 weeks  PLANNED INTERVENTIONS: Therapeutic exercises, Therapeutic activity, Neuromuscular re-education, Patient/Family education, Self Care, Joint mobilization, Aquatic Therapy, Dry Needling, Electrical stimulation, Cryotherapy, Moist heat, Taping, Vasopneumatic device, Ultrasound, Ionotophoresis 4mg /ml Dexamethasone, Manual therapy, and Re-evaluation  PLAN FOR NEXT SESSION: Rotator cuff strength, assess response to DN   Lorrene Reid, PT 02/26/23 8:24 AM  [facility] [address], [address] [place], [place] [address] Phone # [phone] Fax [phone]

## 2023-02-28 ENCOUNTER — Ambulatory Visit: Payer: BC Managed Care – PPO

## 2023-02-28 DIAGNOSIS — M25511 Pain in right shoulder: Secondary | ICD-10-CM

## 2023-02-28 DIAGNOSIS — M6281 Muscle weakness (generalized): Secondary | ICD-10-CM

## 2023-02-28 DIAGNOSIS — R252 Cramp and spasm: Secondary | ICD-10-CM

## 2023-02-28 DIAGNOSIS — M25611 Stiffness of right shoulder, not elsewhere classified: Secondary | ICD-10-CM

## 2023-02-28 NOTE — Therapy (Signed)
OUTPATIENT PHYSICAL THERAPY TREATMENT NOTE   Patient Name: Wyatt Meyer MRN: [id number] DOB:03/19/1961, 62 y.o., male Today's Date: 02/28/2023  END OF SESSION:  PT End of Session - 02/28/23 0737     Visit Number 6    Date for PT Re-Evaluation 04/02/23    Authorization Type BCBS    PT Start Time 0730    PT Stop Time 0802    PT Time Calculation (min) 32 min    Activity Tolerance Patient tolerated treatment well    Behavior During Therapy [facility] for tasks assessed/performed                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/23/2016   Hyperlipidemia    on meds   Iron deficiency anemia    on IRON and Geritol   Kidney stone    Klebsiella sepsis (HCC) 03/25/2015   Osteoarthritis 08/11/2012   Peripheral neuropathy 08/11/2012   Prostate cancer (HCC)    Smoker    SUI (stress urinary incontinence), male 02/03/2013   Uncontrolled diabetes mellitus with complications 03/23/2015   on meds   Past Surgical History:  Procedure Laterality Date   COLONOSCOPY  01/27/2021   Dr.Beavers   COLONOSCOPY WITH PROPOFOL N/A 06/11/2022   Procedure: COLONOSCOPY WITH PROPOFOL;  Surgeon: Tressia Danas, MD;  Location: [facility] ENDOSCOPY;  Service: Gastroenterology;  Laterality: N/A;   LAPAROSCOPIC RIGHT HEMI COLECTOMY Right 05/03/2021   Procedure: LAPAROSCOPIC RIGHT HEMI COLECTOMY WITH LYSIS OF ADHESIONS AND BILATERAL TAP BLOCK;  Surgeon: Andria Meuse, MD;  Location: [facility];  Service: General;  Laterality: Right;   PROSTATE SURGERY  09/25/2011   PROSTATE SURGERY     WISDOM TOOTH EXTRACTION     Patient Active Problem List   Diagnosis Date Noted   Pain in right shoulder 02/05/2023   History of colon cancer, stage II    S/P right hemicolectomy 05/03/2021   Colon cancer (HCC) 03/31/2021   Leg edema 04/19/2020   Vitamin B12 deficiency 07/23/2017   Type 2 diabetes mellitus with hyperglycemia (HCC) 02/15/2017    Smoker    Erectile dysfunction following radical prostatectomy 10/23/2016   Hyperlipidemia 03/23/2015   History of prostate cancer 03/23/2015   SUI (stress urinary incontinence), male 02/03/2013   Osteoarthritis 08/11/2012   Diabetic neuropathy (HCC) 08/11/2012    PCP: Ardith Dark, MD  REFERRING PROVIDER: Ardith Dark, MD  REFERRING DIAG: M25.511 (ICD-10-CM) - Acute pain of right shoulder  THERAPY DIAG:  Acute pain of right shoulder  Stiffness of right shoulder, not elsewhere classified  Muscle weakness (generalized)  Cramp and spasm  Rationale for Evaluation and Treatment: Rehabilitation  ONSET DATE: 01/28/2023  SUBJECTIVE:  SUBJECTIVE STATEMENT: I didn't see much change in symptoms after needling. I had a lot of pain when I  got up this morning.     Hand dominance: Right  PERTINENT HISTORY: Recent colon surgery for colon cancer  PAIN:  02/21/23: Are you having pain? Yes: NPRS scale: 2/10.  Up to 6-7/10 Pain location: right shoulder Pain description: intermittent pain, radiating down right UE Aggravating factors: certain movements Relieving factors: rest  PRECAUTIONS: None  WEIGHT BEARING RESTRICTIONS: No  FALLS:  Has patient fallen in last 6 months? No  LIVING ENVIRONMENT: Lives with: lives alone Lives in: House/apartment   OCCUPATION: Engineering/computer work  PLOF: Independent, Independent with basic ADLs, Independent with household mobility without device, Independent with community mobility without device, Independent with homemaking with ambulation, Independent with gait, and Independent with transfers  PATIENT GOALS: Avoiding any chance of joint issues in the event he is diagnosed with RA.  This runs in his family.   NEXT MD VISIT: prn  OBJECTIVE:    DIAGNOSTIC FINDINGS:  na  PATIENT SURVEYS :  Eval:  FOTO 60, predicted  COGNITION: Overall cognitive status: Within functional limits for tasks assessed     SENSATION: Patient reports some radicular symptoms into the right hand along ulnar nerve distribution  POSTURE: Rounded shoulders fwd head  UPPER EXTREMITY ROM:   All WNL with painful arc  UPPER EXTREMITY MMT: All 4+/5 with exception of right shoulder ER and scaption with IR which is  4-/5  SHOULDER SPECIAL TESTS: Impingement tests: Hawkins/Kennedy impingement test: positive  and Painful arc test: positive  SLAP lesions: Biceps load test: positive  Instability tests: Apprehension test: negative Rotator cuff assessment: Drop arm test: negative and Empty can test: negative Biceps assessment: Speed's test: positive   JOINT MOBILITY TESTING:  No significant laxity or restriction  PALPATION:  Tender at posterior shoulder   TODAY'S TREATMENT:       DATE: 02/28/23 Arm Bike: level 1.5 x 6 min (3/3)- PT present to discuss Ball roll outs forward and Lt lateral x10 Side lying ER right x 20 with 1# Supine serratus punch with 1# x 20 right Supine shoulder alphabet with 1# A-Z Addaday: to Rt upper trap, rhomboids and posterior deltoid   DATE: 02/26/2023 Arm Bike: level 1.5 x 6 min (3/3)- PT present to discuss 3 way scapular stabilization with blue loop x 10 right 4D ball rolls with light blue plyo ball x 20 each right Trigger Point Dry-Needling  Treatment instructions: Expect mild to moderate muscle soreness. S/S of pneumothorax if dry needled over a lung field, and to seek immediate medical attention should they occur. Patient verbalized understanding of these instructions and education.  Patient Consent Given: Yes Education handout provided: Previously provided Muscles treated: Rt upper trap, rhomboids, posterior deltoid and lats Treatment response/outcome: Utilized skilled palpation to identify trigger points.  During  dry needling able to palpate muscle twitch and muscle elongation  Elongation and release to Rt posterior shoulder and upper traps  Skilled palpation and monitoring by PT during dry needling  DATE: 02/21/2023 Arm Bike: level 1.5 x 6 min (3/3)- PT present to discuss 3 way scapular stabilization with blue loop x 10 right 4D ball rolls with light blue plyo ball x 20 each right Standing shoulder flexion and scaption  2 x10 each on Rt with 1# weight (verbal and tactile cues for scapular setting to avoid compensation and subsequently suffering neck pain.   Prone shoulder extension, rows and horizontal abduction x 20 each with 1# Side lying ER right x 20 with 1# Supine serratus punch with 1# x 20 right Supine shoulder alphabet with 1# A-Z Ice to right shoulder x 10 min in supine hook lying with bolster under knees Emphasized importance of follow through with HEP to assess whether he needs further imaging; he is extremely weak, therefore his symptoms would likely improve if he is consistent with his HEP.  PATIENT EDUCATION: Education details: Initiated HEP, educated on pain Person educated: Patient Education method: Programmer, multimedia, Facilities manager, Verbal cues, and Handouts Education comprehension: verbalized understanding  HOME EXERCISE PROGRAM: Access Code: [email or web address] URL: [email or web address] Date: 02/05/2023 Prepared by: Mikey Kirschner  Exercises - Prone Shoulder Extension - Single Arm  - 2 x daily - 7 x weekly - 2 sets - 10 reps - Prone Shoulder Row  - 2 x daily - 7 x weekly - 2 sets - 10 reps - Prone Single Arm Shoulder Horizontal Abduction with Scapular Retraction and Palm Down  - 2 x daily - 7 x weekly - 2 sets - 10 reps - Sidelying Shoulder External Rotation  - 2 x daily - 7 x weekly - 2 sets - 10 reps - Single Arm Serratus Punches in Supine with  Dumbbell  - 2 x daily - 7 x weekly - 2 sets - 10 reps - Standing Shoulder Flexion to 90 Degrees with Dumbbells  - 2 x daily - 7 x weekly - 2 sets - 10 reps - Standing Shoulder Scaption  - 2 x daily - 7 x weekly - 2 sets - 10 reps  ASSESSMENT:  CLINICAL IMPRESSION: Pt reports that he didn't notice a change with DN last session.  PT encouraged him to try 1 more time next week to determine if there is benefit.  Session modified due to pain with exercise against gravity in sitting/standing.  PT provided tactile and verbal cues for scapular depression throughout session.  Pt with functional postural strength deficits and will continue with HEP to address.   Pt has good relief with addaday to Lt upper trap and rhomboids due to muscle tension.    He would benefit from continuing skilled PT for rotator cuff strengthening, scapular stabilization and generalized postural strengthening.     OBJECTIVE IMPAIRMENTS: decreased ROM, decreased strength, increased fascial restrictions, increased muscle spasms, impaired flexibility, impaired sensation, impaired UE functional use, postural dysfunction, and pain.   ACTIVITY LIMITATIONS: carrying, lifting, sleeping, transfers, bed mobility, dressing, reach over head, and hygiene/grooming  PARTICIPATION LIMITATIONS: meal prep, cleaning, laundry, driving, shopping, community activity, occupation, and yard work  PERSONAL FACTORS: Age, Behavior pattern, and Fitness are also affecting patient's functional outcome.   REHAB POTENTIAL: Good  CLINICAL DECISION MAKING: Stable/uncomplicated  EVALUATION COMPLEXITY: Low  GOALS: Goals reviewed with patient? Yes  SHORT TERM GOALS: Target date: 03/05/2023   Pain report to be no greater than 4/10  Baseline: 3-5/10 (02/25/22) Goal status: IN PROGRESS  2.  Patient will be independent with initial HEP  Baseline:  Goal status: MET   LONG TERM GOALS: Target date: 04/02/2023  Patient to be independent with advanced HEP   Baseline:  Goal status: INITIAL  2.  Patient to report pain no greater than 2/10  Baseline:  Goal status: INITIAL  3.  Patient to be able to sleep through the night  Baseline:  Goal status: INITIAL  4.  Patient to be able to reach overhead into cabinets and on top of shelves without shoulder pain  Baseline:  Goal status: INITIAL  5.  Patient to report 85% improvement in overall symptoms  Baseline:   Goal status: INITIAL   PLAN: PT FREQUENCY: 2x/week  PT DURATION: 8 weeks  PLANNED INTERVENTIONS: Therapeutic exercises, Therapeutic activity, Neuromuscular re-education, Patient/Family education, Self Care, Joint mobilization, Aquatic Therapy, Dry Needling, Electrical stimulation, Cryotherapy, Moist heat, Taping, Vasopneumatic device, Ultrasound, Ionotophoresis 4mg /ml Dexamethasone, Manual therapy, and Re-evaluation  PLAN FOR NEXT SESSION: Rotator cuff strength, try DN again if pt agrees    Lorrene Reid, PT 02/28/23 8:04 AM  [facility] [address], [address] [place], [place] [address] Phone # [phone] Fax [phone]

## 2023-03-06 ENCOUNTER — Ambulatory Visit: Payer: BC Managed Care – PPO

## 2023-03-06 DIAGNOSIS — M25511 Pain in right shoulder: Secondary | ICD-10-CM

## 2023-03-06 DIAGNOSIS — M25611 Stiffness of right shoulder, not elsewhere classified: Secondary | ICD-10-CM

## 2023-03-06 DIAGNOSIS — M6281 Muscle weakness (generalized): Secondary | ICD-10-CM

## 2023-03-06 DIAGNOSIS — R252 Cramp and spasm: Secondary | ICD-10-CM

## 2023-03-06 NOTE — Therapy (Signed)
OUTPATIENT PHYSICAL THERAPY TREATMENT NOTE   Patient Name: Wyatt Meyer MRN: [id number] DOB:18-Aug-1961, 62 y.o., male Today's Date: 03/06/2023  END OF SESSION:  PT End of Session - 03/06/23 1701     Visit Number 7    Date for PT Re-Evaluation 04/02/23    Authorization Type BCBS    PT Start Time 1617    PT Stop Time 1659    PT Time Calculation (min) 42 min    Activity Tolerance Patient tolerated treatment well    Behavior During Therapy [facility] for tasks assessed/performed                 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/23/2016   Hyperlipidemia    on meds   Iron deficiency anemia    on IRON and Geritol   Kidney stone    Klebsiella sepsis (HCC) 03/25/2015   Osteoarthritis 08/11/2012   Peripheral neuropathy 08/11/2012   Prostate cancer (HCC)    Smoker    SUI (stress urinary incontinence), male 02/03/2013   Uncontrolled diabetes mellitus with complications 03/23/2015   on meds   Past Surgical History:  Procedure Laterality Date   COLONOSCOPY  01/27/2021   Dr.Beavers   COLONOSCOPY WITH PROPOFOL N/A 06/11/2022   Procedure: COLONOSCOPY WITH PROPOFOL;  Surgeon: Tressia Danas, MD;  Location: [facility] ENDOSCOPY;  Service: Gastroenterology;  Laterality: N/A;   LAPAROSCOPIC RIGHT HEMI COLECTOMY Right 05/03/2021   Procedure: LAPAROSCOPIC RIGHT HEMI COLECTOMY WITH LYSIS OF ADHESIONS AND BILATERAL TAP BLOCK;  Surgeon: Andria Meuse, MD;  Location: [facility] ORS;  Service: General;  Laterality: Right;   PROSTATE SURGERY  09/25/2011   PROSTATE SURGERY     WISDOM TOOTH EXTRACTION     Patient Active Problem List   Diagnosis Date Noted   Pain in right shoulder 02/05/2023   History of colon cancer, stage II    S/P right hemicolectomy 05/03/2021   Colon cancer (HCC) 03/31/2021   Leg edema 04/19/2020   Vitamin B12 deficiency 07/23/2017   Type 2 diabetes mellitus with hyperglycemia (HCC) 02/15/2017    Smoker    Erectile dysfunction following radical prostatectomy 10/23/2016   Hyperlipidemia 03/23/2015   History of prostate cancer 03/23/2015   SUI (stress urinary incontinence), male 02/03/2013   Osteoarthritis 08/11/2012   Diabetic neuropathy (HCC) 08/11/2012    PCP: Ardith Dark, MD  REFERRING PROVIDER: Ardith Dark, MD  REFERRING DIAG: M25.511 (ICD-10-CM) - Acute pain of right shoulder  THERAPY DIAG:  Acute pain of right shoulder  Muscle weakness (generalized)  Stiffness of right shoulder, not elsewhere classified  Cramp and spasm  Rationale for Evaluation and Treatment: Rehabilitation  ONSET DATE: 01/28/2023  SUBJECTIVE:  SUBJECTIVE STATEMENT: The past day has been good.  Prior to that, I had a few bad days.  I have numbness and tingling in to my Rt arm at times.      Hand dominance: Right  PERTINENT HISTORY: Recent colon surgery for colon cancer  PAIN:  02/21/23: Are you having pain? Yes: NPRS scale:  /10 Pain location: right shoulder Pain description: intermittent pain, radiating down right UE Aggravating factors: certain movements Relieving factors: rest  PRECAUTIONS: None  WEIGHT BEARING RESTRICTIONS: No  FALLS:  Has patient fallen in last 6 months? No  LIVING ENVIRONMENT: Lives with: lives alone Lives in: House/apartment   OCCUPATION: Engineering/computer work  PLOF: Independent, Independent with basic ADLs, Independent with household mobility without device, Independent with community mobility without device, Independent with homemaking with ambulation, Independent with gait, and Independent with transfers  PATIENT GOALS: Avoiding any chance of joint issues in the event he is diagnosed with RA.  This runs in his family.   NEXT MD VISIT: prn  OBJECTIVE:    DIAGNOSTIC FINDINGS:  na  PATIENT SURVEYS :  Eval:  FOTO 60, predicted  COGNITION: Overall cognitive status: Within functional limits for tasks assessed     SENSATION: Patient reports some radicular symptoms into the right hand along ulnar nerve distribution  POSTURE: Rounded shoulders fwd head  UPPER EXTREMITY ROM:   All WNL with painful arc  UPPER EXTREMITY MMT: All 4+/5 with exception of right shoulder ER and scaption with IR which is  4-/5  SHOULDER SPECIAL TESTS: Impingement tests: Hawkins/Kennedy impingement test: positive  and Painful arc test: positive  SLAP lesions: Biceps load test: positive  Instability tests: Apprehension test: negative Rotator cuff assessment: Drop arm test: negative and Empty can test: negative Biceps assessment: Speed's test: positive   JOINT MOBILITY TESTING:  No significant laxity or restriction  PALPATION:  Tender at posterior shoulder   TODAY'S TREATMENT:       DATE: 03/06/23 Arm Bike: level 1.5 x 6 min (3/3)- PT present to discuss Ball roll outs forward and Lt lateral x10 Side lying ER right x 20 with 2# Supine serratus punch with 2# x 20 right Sidelying shoulder abduction 2# 2x10 Answered pt and pt's wife's questions regarding course of treatment  DATE: 02/28/23 Arm Bike: level 1.5 x 6 min (3/3)- PT present to discuss Ball roll outs forward and Lt lateral x10 Side lying ER right x 20 with 1# Supine serratus punch with 1# x 20 right Supine shoulder alphabet with 1# A-Z Addaday: to Rt upper trap, rhomboids and posterior deltoid   DATE: 02/26/2023 Arm Bike: level 1.5 x 6 min (3/3)- PT present to discuss 3 way scapular stabilization with blue loop x 10 right 4D ball rolls with light blue plyo ball x 20 each right Trigger Point Dry-Needling  Treatment instructions: Expect mild to moderate muscle soreness. S/S of pneumothorax if dry needled over a lung field, and to seek immediate medical attention should they occur. Patient  verbalized understanding of these instructions and education.  Patient Consent Given: Yes Education handout provided: Previously provided Muscles treated: Rt upper trap, rhomboids, posterior deltoid and lats Treatment response/outcome: Utilized skilled palpation to identify trigger points.  During dry needling able to palpate muscle twitch and muscle elongation  Elongation and release to Rt posterior shoulder and upper traps  Skilled palpation and monitoring by PT during dry needling   PATIENT EDUCATION: Education details: Initiated HEP, educated on pain Person educated: Patient Education method: Programmer, multimedia, Facilities manager, Verbal cues,  and Handouts Education comprehension: verbalized understanding  HOME EXERCISE PROGRAM: Access Code: [email or web address] URL: [email or web address] Date: 03/06/2023 Prepared by: Tresa Endo  Exercises - Prone Shoulder Extension - Single Arm  - 2 x daily - 7 x weekly - 2 sets - 10 reps - Prone Shoulder Row  - 2 x daily - 7 x weekly - 2 sets - 10 reps - Prone Single Arm Shoulder Horizontal Abduction with Scapular Retraction and Palm Down  - 2 x daily - 7 x weekly - 2 sets - 10 reps - Sidelying Shoulder External Rotation  - 2 x daily - 7 x weekly - 2 sets - 10 reps - Single Arm Serratus Punches in Supine with Dumbbell  - 2 x daily - 7 x weekly - 2 sets - 10 reps - Standing Shoulder Flexion to 90 Degrees with Dumbbells  - 2 x daily - 7 x weekly - 2 sets - 10 reps - Standing Shoulder Scaption  - 2 x daily - 7 x weekly - 2 sets - 10 reps - Scapular Retraction with Resistance  - 2 x daily - 7 x weekly - 2 sets - 10 reps - Shoulder extension with resistance - Neutral  - 2 x daily - 7 x weekly - 2 sets - 10 reps - Seated Correct Posture  - 1 x daily - 7 x weekly - 3 sets - 10 reps  ASSESSMENT:  CLINICAL IMPRESSION: Pt arrived with minimal pain today but had a few bad days since last session.  Pt reports that he has radiculopathy at times and PT educated pt  regarding shoulder impingement and need for postural strength and improved alignment.  Pt requires tactile cues for scapular depression and has palpable tension in Rt upper traps and rhomboids that are likely contributing to his symptoms. He would benefit from continuing skilled PT for rotator cuff strengthening, scapular stabilization, manual therapy and generalized postural strengthening.     OBJECTIVE IMPAIRMENTS: decreased ROM, decreased strength, increased fascial restrictions, increased muscle spasms, impaired flexibility, impaired sensation, impaired UE functional use, postural dysfunction, and pain.   ACTIVITY LIMITATIONS: carrying, lifting, sleeping, transfers, bed mobility, dressing, reach over head, and hygiene/grooming  PARTICIPATION LIMITATIONS: meal prep, cleaning, laundry, driving, shopping, community activity, occupation, and yard work  PERSONAL FACTORS: Age, Behavior pattern, and Fitness are also affecting patient's functional outcome.   REHAB POTENTIAL: Good  CLINICAL DECISION MAKING: Stable/uncomplicated  EVALUATION COMPLEXITY: Low  GOALS: Goals reviewed with patient? Yes  SHORT TERM GOALS: Target date: 03/05/2023   Pain report to be no greater than 4/10  Baseline: 3-5/10 (02/25/22) Goal status: IN PROGRESS  2.  Patient will be independent with initial HEP  Baseline:  Goal status: MET   LONG TERM GOALS: Target date: 04/02/2023   Patient to be independent with advanced HEP  Baseline:  Goal status: INITIAL  2.  Patient to report pain no greater than 2/10  Baseline:  Goal status: INITIAL  3.  Patient to be able to sleep through the night  Baseline:  Goal status: INITIAL  4.  Patient to be able to reach overhead into cabinets and on top of shelves without shoulder pain  Baseline:  Goal status: INITIAL  5.  Patient to report 85% improvement in overall symptoms  Baseline:   Goal status: INITIAL   PLAN: PT FREQUENCY: 2x/week  PT DURATION: 8  weeks  PLANNED INTERVENTIONS: Therapeutic exercises, Therapeutic activity, Neuromuscular re-education, Patient/Family education, Self Care, Joint mobilization, Aquatic Therapy, Dry Needling, Electrical stimulation, Cryotherapy, Moist  heat, Taping, Vasopneumatic device, Ultrasound, Ionotophoresis 4mg /ml Dexamethasone, Manual therapy, and Re-evaluation  PLAN FOR NEXT SESSION: Rotator cuff strength, try DN again if pt agrees to Rt rhomboids, upper trap and posterior shoulder   Lorrene Reid, PT 03/06/23 5:01 PM  [facility] [address], [address] [place], [place] [address] Phone # [phone] Fax [phone]

## 2023-03-07 ENCOUNTER — Ambulatory Visit: Payer: BC Managed Care – PPO

## 2023-03-11 ENCOUNTER — Ambulatory Visit: Payer: BC Managed Care – PPO

## 2023-03-11 DIAGNOSIS — M25511 Pain in right shoulder: Secondary | ICD-10-CM | POA: Diagnosis not present

## 2023-03-11 DIAGNOSIS — R252 Cramp and spasm: Secondary | ICD-10-CM

## 2023-03-11 DIAGNOSIS — M6281 Muscle weakness (generalized): Secondary | ICD-10-CM

## 2023-03-11 DIAGNOSIS — M25611 Stiffness of right shoulder, not elsewhere classified: Secondary | ICD-10-CM

## 2023-03-11 NOTE — Therapy (Signed)
OUTPATIENT PHYSICAL THERAPY TREATMENT NOTE   Patient Name: Wyatt Meyer MRN: [id number] DOB:13-Jul-1961, 62 y.o., male Today's Date: 03/11/2023  END OF SESSION:  PT End of Session - 03/11/23 1657     Visit Number 8    Date for PT Re-Evaluation 04/02/23    Authorization Type BCBS    PT Start Time 1619    PT Stop Time 1658    PT Time Calculation (min) 39 min    Activity Tolerance Patient tolerated treatment well    Behavior During Therapy [facility] for tasks assessed/performed                  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/23/2016   Hyperlipidemia    on meds   Iron deficiency anemia    on IRON and Geritol   Kidney stone    Klebsiella sepsis (HCC) 03/25/2015   Osteoarthritis 08/11/2012   Peripheral neuropathy 08/11/2012   Prostate cancer (HCC)    Smoker    SUI (stress urinary incontinence), male 02/03/2013   Uncontrolled diabetes mellitus with complications 03/23/2015   on meds   Past Surgical History:  Procedure Laterality Date   COLONOSCOPY  01/27/2021   Dr.Beavers   COLONOSCOPY WITH PROPOFOL N/A 06/11/2022   Procedure: COLONOSCOPY WITH PROPOFOL;  Surgeon: Tressia Danas, MD;  Location: [facility] ENDOSCOPY;  Service: Gastroenterology;  Laterality: N/A;   LAPAROSCOPIC RIGHT HEMI COLECTOMY Right 05/03/2021   Procedure: LAPAROSCOPIC RIGHT HEMI COLECTOMY WITH LYSIS OF ADHESIONS AND BILATERAL TAP BLOCK;  Surgeon: Andria Meuse, MD;  Location: [facility] ORS;  Service: General;  Laterality: Right;   PROSTATE SURGERY  09/25/2011   PROSTATE SURGERY     WISDOM TOOTH EXTRACTION     Patient Active Problem List   Diagnosis Date Noted   Pain in right shoulder 02/05/2023   History of colon cancer, stage II    S/P right hemicolectomy 05/03/2021   Colon cancer (HCC) 03/31/2021   Leg edema 04/19/2020   Vitamin B12 deficiency 07/23/2017   Type 2 diabetes mellitus with hyperglycemia (HCC)  02/15/2017   Smoker    Erectile dysfunction following radical prostatectomy 10/23/2016   Hyperlipidemia 03/23/2015   History of prostate cancer 03/23/2015   SUI (stress urinary incontinence), male 02/03/2013   Osteoarthritis 08/11/2012   Diabetic neuropathy (HCC) 08/11/2012    PCP: Ardith Dark, MD  REFERRING PROVIDER: Ardith Dark, MD  REFERRING DIAG: M25.511 (ICD-10-CM) - Acute pain of right shoulder  THERAPY DIAG:  Acute pain of right shoulder  Muscle weakness (generalized)  Stiffness of right shoulder, not elsewhere classified  Cramp and spasm  Rationale for Evaluation and Treatment: Rehabilitation  ONSET DATE: 01/28/2023  SUBJECTIVE:  SUBJECTIVE STATEMENT: I've had many good days since last session.  I don't want to do needling today.    Hand dominance: Right  PERTINENT HISTORY: Recent colon surgery for colon cancer  PAIN:  03/11/23: Are you having pain? Yes: NPRS scale: 0, up to 7/10 Pain location: right shoulder Pain description: intermittent pain, radiating down right UE Aggravating factors: certain movements Relieving factors: rest , medication, topical rub  PRECAUTIONS: None  WEIGHT BEARING RESTRICTIONS: No  FALLS:  Has patient fallen in last 6 months? No  LIVING ENVIRONMENT: Lives with: lives alone Lives in: House/apartment   OCCUPATION: Engineering/computer work  PLOF: Independent, Independent with basic ADLs, Independent with household mobility without device, Independent with community mobility without device, Independent with homemaking with ambulation, Independent with gait, and Independent with transfers  PATIENT GOALS: Avoiding any chance of joint issues in the event he is diagnosed with RA.  This runs in his family.   NEXT MD VISIT: prn  OBJECTIVE:    DIAGNOSTIC FINDINGS:  na  PATIENT SURVEYS :  Eval:  FOTO 60, predicted  COGNITION: Overall cognitive status: Within functional limits for tasks assessed     SENSATION: Patient reports some radicular symptoms into the right hand along ulnar nerve distribution  POSTURE: Rounded shoulders fwd head  UPPER EXTREMITY ROM:   All WNL with painful arc  UPPER EXTREMITY MMT: All 4+/5 with exception of right shoulder ER and scaption with IR which is  4-/5  SHOULDER SPECIAL TESTS: Impingement tests: Hawkins/Kennedy impingement test: positive  and Painful arc test: positive  SLAP lesions: Biceps load test: positive  Instability tests: Apprehension test: negative Rotator cuff assessment: Drop arm test: negative and Empty can test: negative Biceps assessment: Speed's test: positive   JOINT MOBILITY TESTING:  No significant laxity or restriction  PALPATION:  Tender at posterior shoulder   TODAY'S TREATMENT:   DATE: 03/11/23 Arm Bike: level 1.5 x 6 min (3/3)- PT present to discuss Ball roll outs forward and Lt lateral x10 Side lying ER right x 20 with 2# Supine serratus punch with 2# x 20 right Sidelying shoulder abduction 2# 2x10 Wall clocks: green loop 2x5 Shoulder flexion and scaption 1# added 2x10 each  Addaday: Rt posterior shoulder and upper trap      DATE: 03/06/23 Arm Bike: level 1.5 x 6 min (3/3)- PT present to discuss Ball roll outs forward and Lt lateral x10 Side lying ER right x 20 with 2# Supine serratus punch with 2# x 20 right Sidelying shoulder abduction 2# 2x10 Answered pt and pt's wife's questions regarding course of treatment  DATE: 02/28/23 Arm Bike: level 1.5 x 6 min (3/3)- PT present to discuss Ball roll outs forward and Lt lateral x10 Side lying ER right x 20 with 1# Supine serratus punch with 1# x 20 right Supine shoulder alphabet with 1# A-Z Addaday: to Rt upper trap, rhomboids and posterior deltoid   DATE: 02/26/2023 Arm Bike: level 1.5 x 6 min  (3/3)- PT present to discuss 3 way scapular stabilization with blue loop x 10 right 4D ball rolls with light blue plyo ball x 20 each right Trigger Point Dry-Needling  Treatment instructions: Expect mild to moderate muscle soreness. S/S of pneumothorax if dry needled over a lung field, and to seek immediate medical attention should they occur. Patient verbalized understanding of these instructions and education.  Patient Consent Given: Yes Education handout provided: Previously provided Muscles treated: Rt upper trap, rhomboids, posterior deltoid and lats Treatment response/outcome: Utilized skilled palpation to  identify trigger points.  During dry needling able to palpate muscle twitch and muscle elongation  Elongation and release to Rt posterior shoulder and upper traps  Skilled palpation and monitoring by PT during dry needling   PATIENT EDUCATION: Education details: Initiated HEP, educated on pain Person educated: Patient Education method: Programmer, multimedia, Facilities manager, Verbal cues, and Handouts Education comprehension: verbalized understanding  HOME EXERCISE PROGRAM: Access Code: [email or web address] URL: [email or web address] Date: 03/06/2023 Prepared by: Tresa Endo  Exercises - Prone Shoulder Extension - Single Arm  - 2 x daily - 7 x weekly - 2 sets - 10 reps - Prone Shoulder Row  - 2 x daily - 7 x weekly - 2 sets - 10 reps - Prone Single Arm Shoulder Horizontal Abduction with Scapular Retraction and Palm Down  - 2 x daily - 7 x weekly - 2 sets - 10 reps - Sidelying Shoulder External Rotation  - 2 x daily - 7 x weekly - 2 sets - 10 reps - Single Arm Serratus Punches in Supine with Dumbbell  - 2 x daily - 7 x weekly - 2 sets - 10 reps - Standing Shoulder Flexion to 90 Degrees with Dumbbells  - 2 x daily - 7 x weekly - 2 sets - 10 reps - Standing Shoulder Scaption  - 2 x daily - 7 x weekly - 2 sets - 10 reps - Scapular Retraction with Resistance  - 2 x daily - 7 x weekly - 2 sets - 10  reps - Shoulder extension with resistance - Neutral  - 2 x daily - 7 x weekly - 2 sets - 10 reps - Seated Correct Posture  - 1 x daily - 7 x weekly - 3 sets - 10 reps  ASSESSMENT:  CLINICAL IMPRESSION: Pt reports 40% overall reduction in pain since the start of care.  Pt reports 1-3/10 average Rt shoulder pain with daily tasks and 7/10 max.  Pain is relieved with topical rub and pain medication.  Pt is making postural corrections as he remembers and reports that he does not wake up with shoulder pain at night.  Pt declined DN today due to not having much pain.  PT monitored for technique throughout session and provided tactile and verbal cues for scapular depression. He would benefit from continuing skilled PT for rotator cuff strengthening, scapular stabilization, manual therapy and generalized postural strengthening.     OBJECTIVE IMPAIRMENTS: decreased ROM, decreased strength, increased fascial restrictions, increased muscle spasms, impaired flexibility, impaired sensation, impaired UE functional use, postural dysfunction, and pain.   ACTIVITY LIMITATIONS: carrying, lifting, sleeping, transfers, bed mobility, dressing, reach over head, and hygiene/grooming  PARTICIPATION LIMITATIONS: meal prep, cleaning, laundry, driving, shopping, community activity, occupation, and yard work  PERSONAL FACTORS: Age, Behavior pattern, and Fitness are also affecting patient's functional outcome.   REHAB POTENTIAL: Good  CLINICAL DECISION MAKING: Stable/uncomplicated  EVALUATION COMPLEXITY: Low  GOALS: Goals reviewed with patient? Yes  SHORT TERM GOALS: Target date: 03/05/2023   Pain report to be no greater than 4/10  Baseline: 3-5/10 (02/25/22) Goal status: IN PROGRESS  2.  Patient will be independent with initial HEP  Baseline:  Goal status: MET   LONG TERM GOALS: Target date: 04/02/2023   Patient to be independent with advanced HEP  Baseline:  Goal status: INITIAL  2.  Patient to report  pain no greater than 2/10  Baseline: up to 7/10, average is 1-3/10 (03/11/23) Goal status: IN PROGRESS  3.  Patient to be able to sleep through the night  Baseline: shoulder not waking pt up (03/11/23) Goal status: MET  4.  Patient to be able to reach overhead into cabinets and on top of shelves without shoulder pain  Baseline:  Goal status: INITIAL  5.  Patient to report 85% improvement in overall symptoms  Baseline:  40% better (03/11/23) Goal status: IN PROGRESS   PLAN: PT FREQUENCY: 2x/week  PT DURATION: 8 weeks  PLANNED INTERVENTIONS: Therapeutic exercises, Therapeutic activity, Neuromuscular re-education, Patient/Family education, Self Care, Joint mobilization, Aquatic Therapy, Dry Needling, Electrical stimulation, Cryotherapy, Moist heat, Taping, Vasopneumatic device, Ultrasound, Ionotophoresis 4mg /ml Dexamethasone, Manual therapy, and Re-evaluation  PLAN FOR NEXT SESSION: Rotator cuff strength, try DN again if pt agrees to Rt rhomboids, upper trap and posterior shoulder.  Give Olen Pel, PT 03/11/23 4:58 PM  [facility] [address] [place], [place] [address] Phone # [phone] Fax [phone]

## 2023-03-14 ENCOUNTER — Ambulatory Visit: Payer: BC Managed Care – PPO

## 2023-03-14 DIAGNOSIS — M25511 Pain in right shoulder: Secondary | ICD-10-CM | POA: Diagnosis not present

## 2023-03-14 DIAGNOSIS — M25611 Stiffness of right shoulder, not elsewhere classified: Secondary | ICD-10-CM

## 2023-03-14 DIAGNOSIS — M6281 Muscle weakness (generalized): Secondary | ICD-10-CM

## 2023-03-14 DIAGNOSIS — R252 Cramp and spasm: Secondary | ICD-10-CM

## 2023-03-14 NOTE — Therapy (Signed)
OUTPATIENT PHYSICAL THERAPY TREATMENT NOTE   Patient Name: Wyatt Meyer MRN: [id number] DOB:1961/07/29, 62 y.o., male Today's Date: 03/14/2023  END OF SESSION:  PT End of Session - 03/14/23 1622     Visit Number 9    Date for PT Re-Evaluation 04/02/23    Authorization Type BCBS    PT Start Time 1622    PT Stop Time 1700    PT Time Calculation (min) 38 min    Activity Tolerance Patient tolerated treatment well    Behavior During Therapy [facility] for tasks assessed/performed                  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/23/2016   Hyperlipidemia    on meds   Iron deficiency anemia    on IRON and Geritol   Kidney stone    Klebsiella sepsis (HCC) 03/25/2015   Osteoarthritis 08/11/2012   Peripheral neuropathy 08/11/2012   Prostate cancer (HCC)    Smoker    SUI (stress urinary incontinence), male 02/03/2013   Uncontrolled diabetes mellitus with complications 03/23/2015   on meds   Past Surgical History:  Procedure Laterality Date   COLONOSCOPY  01/27/2021   Dr.Beavers   COLONOSCOPY WITH PROPOFOL N/A 06/11/2022   Procedure: COLONOSCOPY WITH PROPOFOL;  Surgeon: Tressia Danas, MD;  Location: [facility] ENDOSCOPY;  Service: Gastroenterology;  Laterality: N/A;   LAPAROSCOPIC RIGHT HEMI COLECTOMY Right 05/03/2021   Procedure: LAPAROSCOPIC RIGHT HEMI COLECTOMY WITH LYSIS OF ADHESIONS AND BILATERAL TAP BLOCK;  Surgeon: Andria Meuse, MD;  Location: [facility] ORS;  Service: General;  Laterality: Right;   PROSTATE SURGERY  09/25/2011   PROSTATE SURGERY     WISDOM TOOTH EXTRACTION     Patient Active Problem List   Diagnosis Date Noted   Pain in right shoulder 02/05/2023   History of colon cancer, stage II    S/P right hemicolectomy 05/03/2021   Colon cancer (HCC) 03/31/2021   Leg edema 04/19/2020   Vitamin B12 deficiency 07/23/2017   Type 2 diabetes mellitus with hyperglycemia (HCC)  02/15/2017   Smoker    Erectile dysfunction following radical prostatectomy 10/23/2016   Hyperlipidemia 03/23/2015   History of prostate cancer 03/23/2015   SUI (stress urinary incontinence), male 02/03/2013   Osteoarthritis 08/11/2012   Diabetic neuropathy (HCC) 08/11/2012    PCP: Ardith Dark, MD  REFERRING PROVIDER: Ardith Dark, MD  REFERRING DIAG: M25.511 (ICD-10-CM) - Acute pain of right shoulder  THERAPY DIAG:  Acute pain of right shoulder  Muscle weakness (generalized)  Stiffness of right shoulder, not elsewhere classified  Cramp and spasm  Rationale for Evaluation and Treatment: Rehabilitation  ONSET DATE: 01/28/2023  SUBJECTIVE:  SUBJECTIVE STATEMENT: Patient reports he is doing very well.  Pain is minimal to 0/10.  "Today is a good day!"    Hand dominance: Right  PERTINENT HISTORY: Recent colon surgery for colon cancer  PAIN:  03/11/23: Are you having pain? Yes: NPRS scale: 0, up to 7/10 Pain location: right shoulder Pain description: intermittent pain, radiating down right UE Aggravating factors: certain movements Relieving factors: rest , medication, topical rub  PRECAUTIONS: None  WEIGHT BEARING RESTRICTIONS: No  FALLS:  Has patient fallen in last 6 months? No  LIVING ENVIRONMENT: Lives with: lives alone Lives in: House/apartment   OCCUPATION: Engineering/computer work  PLOF: Independent, Independent with basic ADLs, Independent with household mobility without device, Independent with community mobility without device, Independent with homemaking with ambulation, Independent with gait, and Independent with transfers  PATIENT GOALS: Avoiding any chance of joint issues in the event he is diagnosed with RA.  This runs in his family.   NEXT MD VISIT:  prn  OBJECTIVE:   DIAGNOSTIC FINDINGS:  na  PATIENT SURVEYS :  Eval:  FOTO 60, predicted  COGNITION: Overall cognitive status: Within functional limits for tasks assessed     SENSATION: Patient reports some radicular symptoms into the right hand along ulnar nerve distribution  POSTURE: Rounded shoulders fwd head  UPPER EXTREMITY ROM:   All WNL with painful arc  UPPER EXTREMITY MMT: All 4+/5 with exception of right shoulder ER and scaption with IR which is  4-/5  SHOULDER SPECIAL TESTS: Impingement tests: Hawkins/Kennedy impingement test: positive  and Painful arc test: positive  SLAP lesions: Biceps load test: positive  Instability tests: Apprehension test: negative Rotator cuff assessment: Drop arm test: negative and Empty can test: negative Biceps assessment: Speed's test: positive   JOINT MOBILITY TESTING:  No significant laxity or restriction  PALPATION:  Tender at posterior shoulder   TODAY'S TREATMENT:   DATE: 03/14/23 Arm Bike: level 1.5 x 6 min (3/3)- PT present to discuss 3 way scapular stabilization x 10 each side with yellow loop (verbal cues for correct technique) 4D ball rolls x 20 each bilaterally Prone shoulder extension with 4 lbs 2 x 10 bilaterally Prone shoulder rows with 4 lbs 2 x 10 bilaterally Prone shoulder horizontal abduction 2 x 10 with 2 lbs Side lying ER right 2 x 10 with 2# Supine serratus punch with 4# x 20 bilaterally  DATE: 03/11/23 Arm Bike: level 1.5 x 6 min (3/3)- PT present to discuss Ball roll outs forward and Lt lateral x10 Side lying ER right x 20 with 2# Supine serratus punch with 2# x 20 right Sidelying shoulder abduction 2# 2x10 Wall clocks: green loop 2x5 Shoulder flexion and scaption 1# added 2x10 each  Addaday: Rt posterior shoulder and upper trap      DATE: 03/06/23 Arm Bike: level 1.5 x 6 min (3/3)- PT present to discuss Ball roll outs forward and Lt lateral x10 Side lying ER right x 20 with 2# Supine serratus  punch with 2# x 20 right Sidelying shoulder abduction 2# 2x10 Answered pt and pt's wife's questions regarding course of treatment  DATE: 02/28/23 Arm Bike: level 1.5 x 6 min (3/3)- PT present to discuss Ball roll outs forward and Lt lateral x10 Side lying ER right x 20 with 1# Supine serratus punch with 1# x 20 right Supine shoulder alphabet with 1# A-Z Addaday: to Rt upper trap, rhomboids and posterior deltoid   DATE: 02/26/2023 Arm Bike: level 1.5 x 6 min (3/3)- PT present to  discuss 3 way scapular stabilization with blue loop x 10 right 4D ball rolls with light blue plyo ball x 20 each right Trigger Point Dry-Needling  Treatment instructions: Expect mild to moderate muscle soreness. S/S of pneumothorax if dry needled over a lung field, and to seek immediate medical attention should they occur. Patient verbalized understanding of these instructions and education.  Patient Consent Given: Yes Education handout provided: Previously provided Muscles treated: Rt upper trap, rhomboids, posterior deltoid and lats Treatment response/outcome: Utilized skilled palpation to identify trigger points.  During dry needling able to palpate muscle twitch and muscle elongation  Elongation and release to Rt posterior shoulder and upper traps  Skilled palpation and monitoring by PT during dry needling   PATIENT EDUCATION: Education details: Initiated HEP, educated on pain Person educated: Patient Education method: Programmer, multimedia, Facilities manager, Verbal cues, and Handouts Education comprehension: verbalized understanding  HOME EXERCISE PROGRAM: Access Code: [email or web address] URL: [email or web address] Date: 03/06/2023 Prepared by: Tresa Endo  Exercises - Prone Shoulder Extension - Single Arm  - 2 x daily - 7 x weekly - 2 sets - 10 reps - Prone Shoulder Row  - 2 x daily - 7 x weekly - 2 sets - 10 reps - Prone Single Arm Shoulder Horizontal Abduction with Scapular Retraction and Palm Down  - 2 x daily -  7 x weekly - 2 sets - 10 reps - Sidelying Shoulder External Rotation  - 2 x daily - 7 x weekly - 2 sets - 10 reps - Single Arm Serratus Punches in Supine with Dumbbell  - 2 x daily - 7 x weekly - 2 sets - 10 reps - Standing Shoulder Flexion to 90 Degrees with Dumbbells  - 2 x daily - 7 x weekly - 2 sets - 10 reps - Standing Shoulder Scaption  - 2 x daily - 7 x weekly - 2 sets - 10 reps - Scapular Retraction with Resistance  - 2 x daily - 7 x weekly - 2 sets - 10 reps - Shoulder extension with resistance - Neutral  - 2 x daily - 7 x weekly - 2 sets - 10 reps - Seated Correct Posture  - 1 x daily - 7 x weekly - 3 sets - 10 reps  ASSESSMENT:  CLINICAL IMPRESSION: Doni is responding very well to current treatment regimen.  He came in with very little pain today.  He does continue to have significant weakness in postural muscles and fatigues very easily with all exercises.  Occasionally still having some radicular symptoms at times but he admits this is very rare.  He would benefit from continuing skilled PT for rotator cuff strengthening, scapular stabilization, manual therapy and generalized postural strengthening.     OBJECTIVE IMPAIRMENTS: decreased ROM, decreased strength, increased fascial restrictions, increased muscle spasms, impaired flexibility, impaired sensation, impaired UE functional use, postural dysfunction, and pain.   ACTIVITY LIMITATIONS: carrying, lifting, sleeping, transfers, bed mobility, dressing, reach over head, and hygiene/grooming  PARTICIPATION LIMITATIONS: meal prep, cleaning, laundry, driving, shopping, community activity, occupation, and yard work  PERSONAL FACTORS: Age, Behavior pattern, and Fitness are also affecting patient's functional outcome.   REHAB POTENTIAL: Good  CLINICAL DECISION MAKING: Stable/uncomplicated  EVALUATION COMPLEXITY: Low  GOALS: Goals reviewed with patient? Yes  SHORT TERM GOALS: Target date: 03/05/2023   Pain report to be no  greater than 4/10  Baseline: 3-5/10 (02/25/22) Goal status: IN PROGRESS  2.  Patient will be independent with initial HEP  Baseline:  Goal status: MET   LONG  TERM GOALS: Target date: 04/02/2023   Patient to be independent with advanced HEP  Baseline:  Goal status: INITIAL  2.  Patient to report pain no greater than 2/10  Baseline: up to 7/10, average is 1-3/10 (03/11/23) Goal status: IN PROGRESS  3.  Patient to be able to sleep through the night  Baseline: shoulder not waking pt up (03/11/23) Goal status: MET  4.  Patient to be able to reach overhead into cabinets and on top of shelves without shoulder pain  Baseline:  Goal status: INITIAL  5.  Patient to report 85% improvement in overall symptoms  Baseline:  40% better (03/11/23) Goal status: IN PROGRESS   PLAN: PT FREQUENCY: 2x/week  PT DURATION: 8 weeks  PLANNED INTERVENTIONS: Therapeutic exercises, Therapeutic activity, Neuromuscular re-education, Patient/Family education, Self Care, Joint mobilization, Aquatic Therapy, Dry Needling, Electrical stimulation, Cryotherapy, Moist heat, Taping, Vasopneumatic device, Ultrasound, Ionotophoresis 4mg /ml Dexamethasone, Manual therapy, and Re-evaluation  PLAN FOR NEXT SESSION: Rotator cuff strength, try DN again if pt agrees to Rt rhomboids, upper trap and posterior shoulder.  Give Merton Border B. [name] Sedgwick, PT 03/14/23 5:09 PM  [facility] [address], [address] [place], [place] [address] Phone # [phone] Fax [phone]

## 2023-03-19 ENCOUNTER — Encounter: Payer: Self-pay | Admitting: Physical Therapy

## 2023-03-19 ENCOUNTER — Ambulatory Visit: Payer: BC Managed Care – PPO | Admitting: Physical Therapy

## 2023-03-19 DIAGNOSIS — M25511 Pain in right shoulder: Secondary | ICD-10-CM

## 2023-03-19 DIAGNOSIS — R252 Cramp and spasm: Secondary | ICD-10-CM

## 2023-03-19 DIAGNOSIS — M25611 Stiffness of right shoulder, not elsewhere classified: Secondary | ICD-10-CM

## 2023-03-19 DIAGNOSIS — M6281 Muscle weakness (generalized): Secondary | ICD-10-CM

## 2023-03-19 NOTE — Therapy (Addendum)
 OUTPATIENT PHYSICAL THERAPY TREATMENT NOTE PHYSICAL THERAPY DISCHARGE SUMMARY  Visits from Start of Care: 10  Current functional level related to goals / functional outcomes: See below: patient came for one more visit, but was more than 15 min late, He was given the option to stay but with shortened visit time.  He opted to cancel and did not return after that visit.  This visit on 03/19/23 was his last full visit.    Remaining deficits: See below   Education / Equipment: See below   Patient agrees to discharge. Patient goals were partially met. Patient is being discharged due to not returning since the last visit.    Patient Name: Wyatt Meyer MRN: [id number] DOB:[birth year], 62 y.o., male Today's Date: 05/08/2024  END OF SESSION:         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/23/2016   Hyperlipidemia    on meds   Iron  deficiency anemia    on IRON  and Geritol   Kidney stone    Klebsiella sepsis (HCC) 03/25/2015   Osteoarthritis 08/11/2012   Peripheral neuropathy 08/11/2012   Prostate cancer (HCC)    Smoker    SUI (stress urinary incontinence), male 02/03/2013   Uncontrolled diabetes mellitus with complications 03/23/2015   on meds   Past Surgical History:  Procedure Laterality Date   COLONOSCOPY  01/27/2021   Dr.Beavers   COLONOSCOPY WITH PROPOFOL  N/A 06/11/2022   Procedure: COLONOSCOPY WITH PROPOFOL ;  Surgeon: Eda Iha, MD;  Location: [facility] ENDOSCOPY;  Service: Gastroenterology;  Laterality: N/A;   LAPAROSCOPIC RIGHT HEMI COLECTOMY Right 05/03/2021   Procedure: LAPAROSCOPIC RIGHT HEMI COLECTOMY WITH LYSIS OF ADHESIONS AND BILATERAL TAP BLOCK;  Surgeon: Teresa Lonni HERO, MD;  Location: [facility] OR[facility];  Service: General;  Laterality: Right;   PROSTATE SURGERY  09/25/2011   PROSTATE SURGERY     WISDOM TOOTH EXTRACTION     Patient Active Problem List   Diagnosis Date Noted   Urinary  tract infection 06/09/2023   Pain in right shoulder 02/05/2023   History of colon cancer, stage II    S/P right hemicolectomy 05/03/2021   Colon cancer (HCC) 03/31/2021   Leg edema 04/19/2020   Vitamin B12 deficiency 07/23/2017   Type 2 diabetes mellitus with hyperglycemia (HCC) 02/15/2017   Smoker    Erectile dysfunction following radical prostatectomy 10/23/2016   Hyperlipidemia 03/23/2015   History of prostate cancer 03/23/2015   SUI (stress urinary incontinence), male 02/03/2013   Osteoarthritis 08/11/2012   Diabetic neuropathy (HCC) 08/11/2012    PCP: Kennyth Worth HERO, MD  REFERRING PROVIDER: Kennyth Worth HERO, MD  REFERRING DIAG: M25.511 (ICD-10-CM) - Acute pain of right shoulder  THERAPY DIAG:  Acute pain of right shoulder  Muscle weakness (generalized)  Stiffness of right shoulder, not elsewhere classified  Cramp and spasm  Rationale for Evaluation and Treatment: Rehabilitation  ONSET DATE: 01/28/2023  SUBJECTIVE:  SUBJECTIVE STATEMENT: Patient reports he is doing very well.  Pain is minimal to 0/10.  Today is a good day!    Hand dominance: Right  PERTINENT HISTORY: Recent colon surgery for colon cancer  PAIN:  03/11/23: Are you having pain? Yes: NPRS scale: 0, up to 7/10 Pain location: right shoulder Pain description: intermittent pain, radiating down right UE Aggravating factors: certain movements Relieving factors: rest, medication, topical rub  PRECAUTIONS: None  WEIGHT BEARING RESTRICTIONS: No  FALLS:  Has patient fallen in last 6 months? No  LIVING ENVIRONMENT: Lives with: lives alone Lives in: House/apartment   OCCUPATION: Engineering/computer work  PLOF: Independent, Independent with basic ADLs, Independent with household mobility without device, Independent  with community mobility without device, Independent with homemaking with ambulation, Independent with gait, and Independent with transfers  PATIENT GOALS: Avoiding any chance of joint issues in the event he is diagnosed with RA.  This runs in his family.   NEXT MD VISIT: prn  OBJECTIVE:   DIAGNOSTIC FINDINGS:  na  PATIENT SURVEYS :  Eval:  FOTO 60, predicted  COGNITION: Overall cognitive status: Within functional limits for tasks assessed     SENSATION: Patient reports some radicular symptoms into the right hand along ulnar nerve distribution  POSTURE: Rounded shoulders fwd head  UPPER EXTREMITY ROM:   All WNL with painful arc  UPPER EXTREMITY MMT: All 4+/5 with exception of right shoulder ER and scaption with IR which is  4-/5  SHOULDER SPECIAL TESTS: Impingement tests: Hawkins/Kennedy impingement test: positive  and Painful arc test: positive  SLAP lesions: Biceps load test: positive  Instability tests: Apprehension test: negative Rotator cuff assessment: Drop arm test: negative and Empty can test: negative Biceps assessment: Speed's test: positive   JOINT MOBILITY TESTING:  No significant laxity or restriction  PALPATION:  Tender at posterior shoulder   TODAY'S TREATMENT:   DATE:  03/19/23 Sidelying thoracic rotation x10 reps to the Rt  Side Rt shoulder ER #2 2x10 reps Prone Rt Ys, Ts x10 reps  High rows x15 reps, green TB  Standing Rt UE serratus press into wall x15 reps Seated bear hugs with red TB x15 reps  Supine chin tuck x10 reps Cervical AROM: limited extension (+) pain and Rt scap pain Rt UE pain resolved with manual traction  Addaday: Rt posterior shoulder, Rt UT, Rt Cervical paraspinals   03/14/23 Arm Bike: level 1.5 x 6 min (3/3)- PT present to discuss 3 way scapular stabilization x 10 each side with yellow loop (verbal cues for correct technique) 4D ball rolls x 20 each bilaterally Prone shoulder extension with 4 lbs 2 x 10 bilaterally Prone  shoulder rows with 4 lbs 2 x 10 bilaterally Prone shoulder horizontal abduction 2 x 10 with 2 lbs Side lying ER right 2 x 10 with 2# Supine serratus punch with 4# x 20 bilaterally  DATE: 03/11/23 Arm Bike: level 1.5 x 6 min (3/3)- PT present to discuss Ball roll outs forward and Lt lateral x10 Side lying ER right x 20 with 2# Supine serratus punch with 2# x 20 right Sidelying shoulder abduction 2# 2x10 Wall clocks: green loop 2x5 Shoulder flexion and scaption 1# added 2x10 each  Addaday: Rt posterior shoulder and upper trap      DATE: 03/06/23 Arm Bike: level 1.5 x 6 min (3/3)- PT present to discuss Ball roll outs forward and Lt lateral x10 Side lying ER right x 20 with 2# Supine serratus punch with 2# x 20 right Sidelying  shoulder abduction 2# 2x10 Answered pt and pt's wife's questions regarding course of treatment   DATE: 02/28/23 Arm Bike: level 1.5 x 6 min (3/3)- PT present to discuss Ball roll outs forward and Lt lateral x10 Side lying ER right x 20 with 1# Supine serratus punch with 1# x 20 right Supine shoulder alphabet with 1# A-Z Addaday: to Rt upper trap, rhomboids and posterior deltoid    DATE: 02/26/2023 Arm Bike: level 1.5 x 6 min (3/3)- PT present to discuss 3 way scapular stabilization with blue loop x 10 right 4D ball rolls with light blue plyo ball x 20 each right Trigger Point Dry-Needling  Treatment instructions: Expect mild to moderate muscle soreness. S/S of pneumothorax if dry needled over a lung field, and to seek immediate medical attention should they occur. Patient verbalized understanding of these instructions and education.  Patient Consent Given: Yes Education handout provided: Previously provided Muscles treated: Rt upper trap, rhomboids, posterior deltoid and lats Treatment response/outcome: Utilized skilled palpation to identify trigger points.  During dry needling able to palpate muscle twitch and muscle elongation  Elongation and release to Rt  posterior shoulder and upper traps  Skilled palpation and monitoring by PT during dry needling   PATIENT EDUCATION: Education details: Initiated HEP, educated on pain Person educated: Patient Education method: Programmer, multimedia, Facilities manager, Verbal cues, and Handouts Education comprehension: verbalized understanding  HOME EXERCISE PROGRAM: Access Code: [email or web address] URL: [email or web address] Date: 03/06/2023 Prepared by: Burnard  Exercises - Prone Shoulder Extension - Single Arm  - 2 x daily - 7 x weekly - 2 sets - 10 reps - Prone Shoulder Row  - 2 x daily - 7 x weekly - 2 sets - 10 reps - Prone Single Arm Shoulder Horizontal Abduction with Scapular Retraction and Palm Down  - 2 x daily - 7 x weekly - 2 sets - 10 reps - Sidelying Shoulder External Rotation  - 2 x daily - 7 x weekly - 2 sets - 10 reps - Single Arm Serratus Punches in Supine with Dumbbell  - 2 x daily - 7 x weekly - 2 sets - 10 reps - Standing Shoulder Flexion to 90 Degrees with Dumbbells  - 2 x daily - 7 x weekly - 2 sets - 10 reps - Standing Shoulder Scaption  - 2 x daily - 7 x weekly - 2 sets - 10 reps - Scapular Retraction with Resistance  - 2 x daily - 7 x weekly - 2 sets - 10 reps - Shoulder extension with resistance - Neutral  - 2 x daily - 7 x weekly - 2 sets - 10 reps - Seated Correct Posture  - 1 x daily - 7 x weekly - 3 sets - 10 reps  ASSESSMENT:  CLINICAL IMPRESSION: Today's session continued with focus on increasing RTC and scap strength. Pt had some difficulty with dumbbell resistance and maintaining proper activation of middle trap. During high rows, pt noted Rt UE tingling which resolved after 1-2 minutes. This occurs probably 4 or 5 time a day. Pt could have cervical component to Rt UE pain and N/T as it resolved with manual cervical traction. It might be beneficial to further evaluate this if all goals are not met. No increase in pain was reported end of session.   OBJECTIVE IMPAIRMENTS:  decreased ROM, decreased strength, increased fascial restrictions, increased muscle spasms, impaired flexibility, impaired sensation, impaired UE functional use, postural dysfunction, and pain.   ACTIVITY LIMITATIONS: carrying, lifting, sleeping, transfers, bed mobility, dressing,  reach over head, and hygiene/grooming  PARTICIPATION LIMITATIONS: meal prep, cleaning, laundry, driving, shopping, community activity, occupation, and yard work  PERSONAL FACTORS: Age, Behavior pattern, and Fitness are also affecting patient's functional outcome.   REHAB POTENTIAL: Good  CLINICAL DECISION MAKING: Stable/uncomplicated  EVALUATION COMPLEXITY: Low  GOALS: Goals reviewed with patient? Yes  SHORT TERM GOALS: Target date: 03/05/2023   Pain report to be no greater than 4/10  Baseline: 3-5/10 (02/25/22) Goal status: IN PROGRESS  2.  Patient will be independent with initial HEP  Baseline:  Goal status: MET   LONG TERM GOALS: Target date: 04/02/2023   Patient to be independent with advanced HEP  Baseline:  Goal status: INITIAL  2.  Patient to report pain no greater than 2/10  Baseline: up to 7/10, average is 1-3/10 (03/11/23) Goal status: IN PROGRESS  3.  Patient to be able to sleep through the night  Baseline: shoulder not waking pt up (03/11/23) Goal status: MET  4.  Patient to be able to reach overhead into cabinets and on top of shelves without shoulder pain  Baseline:  Goal status: INITIAL  5.  Patient to report 85% improvement in overall symptoms  Baseline:  40% better (03/11/23) Goal status: IN PROGRESS   PLAN: PT FREQUENCY: 2x/week  PT DURATION: 8 weeks  PLANNED INTERVENTIONS: Therapeutic exercises, Therapeutic activity, Neuromuscular re-education, Patient/Family education, Self Care, Joint mobilization, Aquatic Therapy, Dry Needling, Electrical stimulation, Cryotherapy, Moist heat, Taping, Vasopneumatic device, Ultrasound, Ionotophoresis 4mg /ml Dexamethasone , Manual therapy,  and Re-evaluation  PLAN FOR NEXT SESSION: consider evaluating C-spine for radicular symptoms?; Rotator cuff strength, try DN again if pt agrees to Rt rhomboids, upper trap and posterior shoulder.   Delon B. Fields, PT 05/08/24 10:51 AM   10:51 AM,05/08/24 Camie Butt PT, DPT [facility]  [phone]

## 2023-03-21 ENCOUNTER — Inpatient Hospital Stay: Payer: BC Managed Care – PPO | Attending: Oncology

## 2023-03-26 ENCOUNTER — Ambulatory Visit: Payer: BC Managed Care – PPO | Attending: Family Medicine

## 2023-03-26 DIAGNOSIS — M25611 Stiffness of right shoulder, not elsewhere classified: Secondary | ICD-10-CM | POA: Insufficient documentation

## 2023-03-26 DIAGNOSIS — R252 Cramp and spasm: Secondary | ICD-10-CM | POA: Insufficient documentation

## 2023-03-26 DIAGNOSIS — M25511 Pain in right shoulder: Secondary | ICD-10-CM | POA: Insufficient documentation

## 2023-03-26 NOTE — Therapy (Addendum)
Patient arrived late.  After checking in, PT informed patient that he had about 32 min left of his appointment and gave him the option to stay and complete the visit.  Patient stated, "you know, I do have a lot to do and people waiting on me" and asked to just hold off on PT today.  He has more visits scheduled.  Encouraged patient to call if he ever needs to reschedule as he tends to have difficulty arriving on time for the 4:15 appt time.

## 2023-03-29 ENCOUNTER — Other Ambulatory Visit: Payer: Self-pay | Admitting: Family Medicine

## 2023-04-02 ENCOUNTER — Ambulatory Visit: Payer: BC Managed Care – PPO

## 2023-04-04 ENCOUNTER — Ambulatory Visit: Payer: BC Managed Care – PPO

## 2023-04-08 ENCOUNTER — Telehealth: Payer: Self-pay | Admitting: Family Medicine

## 2023-04-08 ENCOUNTER — Other Ambulatory Visit: Payer: Self-pay | Admitting: Family Medicine

## 2023-04-08 NOTE — Telephone Encounter (Signed)
Patient has came into the office stating the Continuous Glucose Sensor (FREESTYLE LIBRE 14 DAY SENSOR) MISC was sent incorrectly.   States he has been using FREESTYLE LIBRE 2; not the 14 day sensor.  States he cannot get a refund for what he received but has 3 left, if he is able to get a reader for that.  Patient prefers to keep continuing to use [place] 2 sensors, not the 14 day. Please advise & give patient   Patient is leaving out of town for the next 7 days and states he will use the old stick/prick method while he is out of town until he is back to get the correct medication

## 2023-04-09 ENCOUNTER — Other Ambulatory Visit: Payer: Self-pay | Admitting: *Deleted

## 2023-04-11 NOTE — Telephone Encounter (Signed)
Ok with me. Please place any necessary orders. 

## 2023-04-12 NOTE — Telephone Encounter (Signed)
Left message to return call to our office at their convenience.  

## 2023-04-16 ENCOUNTER — Other Ambulatory Visit: Payer: Self-pay

## 2023-04-16 DIAGNOSIS — Z794 Long term (current) use of insulin: Secondary | ICD-10-CM

## 2023-04-16 MED ORDER — FREESTYLE LIBRE 2 SENSOR MISC
5 refills | Status: DC
Start: 2023-04-16 — End: 2024-06-03

## 2023-04-16 NOTE — Telephone Encounter (Signed)
Libre 2 sensors has been sent in

## 2023-04-18 ENCOUNTER — Encounter: Payer: Self-pay | Admitting: Family

## 2023-04-18 ENCOUNTER — Ambulatory Visit: Payer: BC Managed Care – PPO | Admitting: Family

## 2023-04-18 VITALS — BP 118/73 | HR 95 | Temp 97.4°F | Ht 73.0 in | Wt 235.2 lb

## 2023-04-18 DIAGNOSIS — R350 Frequency of micturition: Secondary | ICD-10-CM | POA: Diagnosis not present

## 2023-04-18 LAB — POCT URINALYSIS DIPSTICK
Bilirubin, UA: NEGATIVE
Blood, UA: POSITIVE
Glucose, UA: POSITIVE — AB
Ketones, UA: NEGATIVE
Leukocytes, UA: NEGATIVE
Nitrite, UA: NEGATIVE
Protein, UA: POSITIVE — AB
Spec Grav, UA: 1.02 (ref 1.010–1.025)
Urobilinogen, UA: 0.2 E.U./dL — AB
pH, UA: 5.5 (ref 5.0–8.0)

## 2023-04-18 NOTE — Progress Notes (Signed)
Patient ID: KWADWO TARAS, male    DOB: 02-13-1961, 62 y.o.   MRN: [id number]  Chief Complaint  Patient presents with   Urinary Frequency    Pt c/o urinary frequency, odor, blood in urine and dysuria which has stopped. Present for 10 days.     HPI:   Urinary sx:   Pt c/o urinary frequency, odor, urgency, blood in urine and dysuria (which this he states has stopped). Present for 10 days.  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 Last UTI was last August. pt is diabetic, & reports he has been unable to use his Josephine Igo system as the wrong one was at the pharmacy.      Assessment & Plan:  1. Urinary frequency UA negative, trace blood in urine w/glucose & protein. Advised pt on drinking at least 2L of water daily, monitor BS, may have been running higher due to the stress over the last week. Advised his Josephine Igo 2 system has been sent in to his pharmacy; advised pt on CBG goals. Taking his insulin and Mounjaro as directed.  - POCT Urinalysis Dipstick  Subjective:    Outpatient Medications Prior to Visit  Medication Sig Dispense Refill   azelastine (ASTELIN) 0.1 % nasal spray Place 2 sprays into both nostrils 2 (two) times daily. 30 mL 12   Continuous Glucose Receiver (FREESTYLE LIBRE READER) DEVI Use 4 times daily as needed to check blood sugar. 1 each 1   Continuous Glucose Sensor (FREESTYLE LIBRE 2 SENSOR) MISC USE AS DIRECTED TO CHECK BS 6 each 5   diclofenac (VOLTAREN) 75 MG EC tablet Take 1 tablet (75 mg total) by mouth 2 (two) times daily. 30 tablet 0   insulin glargine (LANTUS SOLOSTAR) 100 UNIT/ML Solostar Pen Inject 60 Units into the skin daily. 15 mL 11   insulin lispro (HUMALOG) 100 UNIT/ML KwikPen Inject 5-15 Units into the skin 3 (three) times daily. Use according to sliding scale 15 mL 11   Insulin Pen Needle (BD PEN NEEDLE NANO U/F) 32G X 4 MM MISC use four times a day 400 each 3   ONE TOUCH LANCETS MISC Use 3x a day  200 each 11   tirzepatide (MOUNJARO) 2.5 MG/0.5ML Pen Inject 2.5 mg into the skin once a week. 2 mL 0   tirzepatide (MOUNJARO) 5 MG/0.5ML Pen Inject 5 mg into the skin once a week. 6 mL 0   No facility-administered medications prior to visit.   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/23/2016   Hyperlipidemia    on meds   Iron deficiency anemia    on IRON and Geritol   Kidney stone    Klebsiella sepsis (HCC) 03/25/2015   Osteoarthritis 08/11/2012   Peripheral neuropathy 08/11/2012   Prostate cancer (HCC)    Smoker    SUI (stress urinary incontinence), male 02/03/2013   Uncontrolled diabetes mellitus with complications 03/23/2015   on meds   Past Surgical History:  Procedure Laterality Date   COLONOSCOPY  01/27/2021   Dr.Beavers   COLONOSCOPY WITH PROPOFOL N/A 06/11/2022   Procedure: COLONOSCOPY WITH PROPOFOL;  Surgeon: Tressia Danas, MD;  Location: [facility] ENDOSCOPY;  Service: Gastroenterology;  Laterality: N/A;   LAPAROSCOPIC RIGHT HEMI COLECTOMY Right 05/03/2021   Procedure: LAPAROSCOPIC RIGHT HEMI COLECTOMY WITH LYSIS OF ADHESIONS AND BILATERAL TAP BLOCK;  Surgeon: Andria Meuse, MD;  Location: [facility] ORS;  Service: General;  Laterality: Right;   PROSTATE SURGERY  09/25/2011   PROSTATE SURGERY     WISDOM TOOTH EXTRACTION     Allergies  Allergen Reactions   Codeine Nausea And Vomiting      Objective:    Physical Exam Vitals and nursing note reviewed.  Constitutional:      General: He is not in acute distress.    Appearance: Normal appearance.  HENT:     Head: Normocephalic.  Cardiovascular:     Rate and Rhythm: Normal rate and regular rhythm.  Pulmonary:     Effort: Pulmonary effort is normal.     Breath sounds: Normal breath sounds.  Musculoskeletal:        General: Normal range of motion.     Cervical back: Normal range of motion.  Skin:    General: Skin is warm and dry.   Neurological:     Mental Status: He is alert and oriented to person, place, and time.  Psychiatric:        Mood and Affect: Mood normal.    BP 118/73 (BP Location: Left Arm, Patient Position: Sitting, Cuff Size: Large)   Pulse 95   Temp (!) 97.4 F (36.3 C) (Temporal)   Ht 6\' 1"  (1.854 m)   Wt 235 lb 3.2 oz (106.7 kg)   SpO2 100%   BMI 31.03 kg/m  Wt Readings from Last 3 Encounters:  04/18/23 235 lb 3.2 oz (106.7 kg)  02/14/23 243 lb 9.6 oz (110.5 kg)  02/13/23 245 lb 3.2 oz (111.2 kg)       Dulce Sellar, NP

## 2023-06-09 ENCOUNTER — Encounter (HOSPITAL_COMMUNITY): Payer: Self-pay

## 2023-06-09 ENCOUNTER — Other Ambulatory Visit: Payer: Self-pay

## 2023-06-09 ENCOUNTER — Observation Stay (HOSPITAL_COMMUNITY)
Admission: EM | Admit: 2023-06-09 | Discharge: 2023-06-10 | Disposition: A | Discharge status: Home / Self Care | Payer: BC Managed Care – PPO | Attending: Internal Medicine | Admitting: Internal Medicine

## 2023-06-09 DIAGNOSIS — N390 Urinary tract infection, site not specified: Secondary | ICD-10-CM | POA: Diagnosis not present

## 2023-06-09 DIAGNOSIS — Z8546 Personal history of malignant neoplasm of prostate: Secondary | ICD-10-CM | POA: Insufficient documentation

## 2023-06-09 DIAGNOSIS — R739 Hyperglycemia, unspecified: Principal | ICD-10-CM

## 2023-06-09 DIAGNOSIS — Z79899 Other long term (current) drug therapy: Secondary | ICD-10-CM | POA: Diagnosis not present

## 2023-06-09 DIAGNOSIS — E1165 Type 2 diabetes mellitus with hyperglycemia: Secondary | ICD-10-CM | POA: Diagnosis present

## 2023-06-09 DIAGNOSIS — Z794 Long term (current) use of insulin: Secondary | ICD-10-CM | POA: Diagnosis not present

## 2023-06-09 DIAGNOSIS — E860 Dehydration: Secondary | ICD-10-CM

## 2023-06-09 DIAGNOSIS — N179 Acute kidney failure, unspecified: Secondary | ICD-10-CM | POA: Diagnosis not present

## 2023-06-09 DIAGNOSIS — R531 Weakness: Secondary | ICD-10-CM

## 2023-06-09 DIAGNOSIS — Z85038 Personal history of other malignant neoplasm of large intestine: Secondary | ICD-10-CM | POA: Diagnosis not present

## 2023-06-09 DIAGNOSIS — F17210 Nicotine dependence, cigarettes, uncomplicated: Secondary | ICD-10-CM | POA: Insufficient documentation

## 2023-06-09 DIAGNOSIS — Z9049 Acquired absence of other specified parts of digestive tract: Secondary | ICD-10-CM | POA: Insufficient documentation

## 2023-06-09 DIAGNOSIS — Z91148 Patient's other noncompliance with medication regimen for other reason: Secondary | ICD-10-CM

## 2023-06-09 DIAGNOSIS — R5383 Other fatigue: Secondary | ICD-10-CM | POA: Diagnosis present

## 2023-06-09 DIAGNOSIS — B961 Klebsiella pneumoniae [K. pneumoniae] as the cause of diseases classified elsewhere: Secondary | ICD-10-CM | POA: Insufficient documentation

## 2023-06-09 LAB — URINALYSIS, ROUTINE W REFLEX MICROSCOPIC
Bilirubin Urine: NEGATIVE
Glucose, UA: >=500 mg/dL — AB
Ketones, ur: NEGATIVE mg/dL
Nitrite: NEGATIVE
Protein, ur: 100 mg/dL — AB
Specific Gravity, Urine: 1.015 (ref 1.005–1.030)
WBC, UA: >50 WBC/hpf (ref 0–5)
pH: 5 (ref 5.0–8.0)

## 2023-06-09 LAB — CBC
HCT: 37.7 % — ABNORMAL LOW (ref 39.0–52.0)
Hemoglobin: 11.8 g/dL — ABNORMAL LOW (ref 13.0–17.0)
MCH: 30.3 pg (ref 26.0–34.0)
MCHC: 31.3 g/dL (ref 30.0–36.0)
MCV: 96.9 fL (ref 80.0–100.0)
Platelets: 192 10*3/uL (ref 150–400)
RBC: 3.89 MIL/uL — ABNORMAL LOW (ref 4.22–5.81)
RDW: 12.9 % (ref 11.5–15.5)
WBC: 11.7 10*3/uL — ABNORMAL HIGH (ref 4.0–10.5)
nRBC: 0 % (ref 0.0–0.2)

## 2023-06-09 LAB — COMPREHENSIVE METABOLIC PANEL
ALT: 17 U/L (ref 0–44)
AST: 20 U/L (ref 15–41)
Albumin: 3.4 g/dL — ABNORMAL LOW (ref 3.5–5.0)
Alkaline Phosphatase: 85 U/L (ref 38–126)
Anion gap: 8 (ref 5–15)
BUN: 26 mg/dL — ABNORMAL HIGH (ref 8–23)
CO2: 24 mmol/L (ref 22–32)
Calcium: 9 mg/dL (ref 8.9–10.3)
Chloride: 98 mmol/L (ref 98–111)
Creatinine, Ser: 1.89 mg/dL — ABNORMAL HIGH (ref 0.61–1.24)
GFR, Estimated: 39 mL/min — ABNORMAL LOW (ref >60)
Glucose, Bld: 374 mg/dL — ABNORMAL HIGH (ref 70–99)
Potassium: 4.6 mmol/L (ref 3.5–5.1)
Sodium: 130 mmol/L — ABNORMAL LOW (ref 135–145)
Total Bilirubin: 0.7 mg/dL (ref 0.3–1.2)
Total Protein: 8.2 g/dL — ABNORMAL HIGH (ref 6.5–8.1)

## 2023-06-09 LAB — CBG MONITORING, ED
Glucose-Capillary: 359 mg/dL — ABNORMAL HIGH (ref 70–99)
Glucose-Capillary: 275 mg/dL — ABNORMAL HIGH (ref 70–99)
Glucose-Capillary: 235 mg/dL — ABNORMAL HIGH (ref 70–99)

## 2023-06-09 LAB — GLUCOSE, CAPILLARY: Glucose-Capillary: 225 mg/dL — ABNORMAL HIGH (ref 70–99)

## 2023-06-09 MED ORDER — SODIUM CHLORIDE 0.9 % IV SOLN
1.0000 g | Freq: Once | INTRAVENOUS | Status: AC
  Administered 2023-06-09: 1 g via INTRAVENOUS
  Filled 2023-06-09: qty 10

## 2023-06-09 MED ORDER — ACETAMINOPHEN 650 MG RE SUPP: 650.0000 mg | Freq: Four times a day (QID) | RECTAL | Status: DC | PRN

## 2023-06-09 MED ORDER — SODIUM CHLORIDE 0.9 % IV SOLN: INTRAVENOUS | Status: AC

## 2023-06-09 MED ORDER — INSULIN ASPART 100 UNIT/ML IJ SOLN
0.0000 [IU] | Freq: Three times a day (TID) | INTRAMUSCULAR | Status: DC
  Administered 2023-06-10: 2 [IU] via SUBCUTANEOUS
  Administered 2023-06-10: 3 [IU] via SUBCUTANEOUS

## 2023-06-09 MED ORDER — ACETAMINOPHEN 325 MG PO TABS: 650.0000 mg | ORAL_TABLET | Freq: Four times a day (QID) | ORAL | Status: DC | PRN

## 2023-06-09 MED ORDER — ONDANSETRON HCL 4 MG/2ML IJ SOLN: 4.0000 mg | Freq: Four times a day (QID) | INTRAMUSCULAR | Status: DC | PRN

## 2023-06-09 MED ORDER — ONDANSETRON HCL 4 MG PO TABS: 4.0000 mg | ORAL_TABLET | Freq: Four times a day (QID) | ORAL | Status: DC | PRN

## 2023-06-09 MED ORDER — SODIUM CHLORIDE 0.9 % IV SOLN: 1.0000 g | INTRAVENOUS | Status: DC

## 2023-06-09 MED ORDER — INSULIN ASPART 100 UNIT/ML IJ SOLN
0.0000 [IU] | Freq: Every day | INTRAMUSCULAR | Status: DC
  Administered 2023-06-09: 2 [IU] via SUBCUTANEOUS

## 2023-06-09 MED ORDER — LACTATED RINGERS IV BOLUS
1000.0000 mL | Freq: Once | INTRAVENOUS | Status: AC
  Administered 2023-06-09: 1000 mL via INTRAVENOUS

## 2023-06-09 MED ORDER — SENNOSIDES-DOCUSATE SODIUM 8.6-50 MG PO TABS: 1.0000 | ORAL_TABLET | Freq: Every evening | ORAL | Status: DC | PRN

## 2023-06-09 MED ORDER — INSULIN ASPART 100 UNIT/ML IJ SOLN
10.0000 [IU] | Freq: Once | INTRAMUSCULAR | Status: AC
  Administered 2023-06-09: 10 [IU] via SUBCUTANEOUS
  Filled 2023-06-09: qty 0.1

## 2023-06-09 MED ORDER — SODIUM CHLORIDE 0.9 % IV BOLUS
1000.0000 mL | Freq: Once | INTRAVENOUS | Status: AC
  Administered 2023-06-09: 1000 mL via INTRAVENOUS

## 2023-06-09 MED ORDER — INSULIN GLARGINE-YFGN 100 UNIT/ML ~~LOC~~ SOLN
30.0000 [IU] | Freq: Every day | SUBCUTANEOUS | Status: DC
  Administered 2023-06-09: 30 [IU] via SUBCUTANEOUS
  Filled 2023-06-09 (×2): qty 0.3

## 2023-06-09 MED ORDER — ENOXAPARIN SODIUM 40 MG/0.4ML IJ SOSY
40.0000 mg | PREFILLED_SYRINGE | INTRAMUSCULAR | Status: DC
  Administered 2023-06-10: 40 mg via SUBCUTANEOUS
  Filled 2023-06-09: qty 0.4

## 2023-06-09 NOTE — Plan of Care (Signed)
Problem: Metabolic: Goal: Ability to maintain appropriate glucose levels will improve Outcome: Progressing

## 2023-06-09 NOTE — ED Provider Notes (Signed)
[place] EMERGENCY DEPARTMENT AT [facility] Provider Note   CSN: [id number] Arrival date & time: 06/09/23  1534     History  Chief Complaint  Patient presents with   Fatigue   Dizziness    Wyatt Meyer is a 62 y.o. male.  Pt w hx iddm, c/o feeling fatigued, lightheaded when stands, intermittent abd cramping, and sleeping more than normal in past couple days. Has not taken his insulin for past three days due to his freestyle libre not working. +polydipsia. No dysuria, but indicates urine odorous/cloudy. No constant/focal abd pain. No vomiting. No diarrhea or constipation. Denies fever or chills. No chest pain or discomfort. No sob or unusual doe. Went to urgent care and was told to go to ER.   The history is provided by the patient, medical records and the spouse.  Dizziness Associated symptoms: no chest pain, no diarrhea, no headaches, no shortness of breath and no vomiting        Home Medications Prior to Admission medications   Medication Sig Start Date End Date Taking? Authorizing Provider  azelastine (ASTELIN) 0.1 % nasal spray Place 2 sprays into both nostrils 2 (two) times daily. 12/14/22   Ardith Dark, MD  Continuous Glucose Sensor (FREESTYLE LIBRE 2 SENSOR) MISC USE AS DIRECTED TO CHECK BS 04/16/23   Ardith Dark, MD  diclofenac (VOLTAREN) 75 MG EC tablet Take 1 tablet (75 mg total) by mouth 2 (two) times daily. 01/28/23   Ardith Dark, MD  insulin glargine (LANTUS SOLOSTAR) 100 UNIT/ML Solostar Pen Inject 60 Units into the skin daily. 12/14/22   Ardith Dark, MD  insulin lispro (HUMALOG) 100 UNIT/ML KwikPen Inject 5-15 Units into the skin 3 (three) times daily. Use according to sliding scale 12/14/22   Ardith Dark, MD  Insulin Pen Needle (BD PEN NEEDLE NANO U/F) 32G X 4 MM MISC use four times a day 03/31/21   Ardith Dark, MD  ONE [facility] LANCETS MISC Use 3x a day 01/17/18   Carlus Pavlov, MD  tirzepatide [facility]) 2.5 MG/0.5ML Pen Inject  2.5 mg into the skin once a week. 02/13/23   Ardith Dark, MD  tirzepatide [facility]) 5 MG/0.5ML Pen Inject 5 mg into the skin once a week. 02/13/23   Ardith Dark, MD      Allergies    Codeine    Review of Systems   Review of Systems  Constitutional:  Negative for fever.  HENT:  Negative for sore throat.   Eyes:  Negative for redness.  Respiratory:  Negative for cough and shortness of breath.   Cardiovascular:  Negative for chest pain.  Gastrointestinal:  Negative for diarrhea and vomiting.  Endocrine: Positive for polydipsia and polyuria.  Genitourinary:  Negative for dysuria and flank pain.  Musculoskeletal:  Negative for neck pain and neck stiffness.  Skin:  Negative for rash.  Neurological:  Positive for light-headedness. Negative for headaches.  Hematological:  Does not bruise/bleed easily.  Psychiatric/Behavioral:  Negative for confusion.     Physical Exam Updated Vital Signs BP (!) 152/86   Pulse 90   Temp 99.1 F (37.3 C) (Oral)   Resp 18   Ht 1.854 m (6\' 1" )   Wt 106.6 kg   SpO2 99%   BMI 31.00 kg/m  Physical Exam Vitals and nursing note reviewed.  Constitutional:      Appearance: Normal appearance. He is well-developed.  HENT:     Head: Atraumatic.  Nose: Nose normal.     Mouth/Throat:     Mouth: Mucous membranes are moist.     Pharynx: Oropharynx is clear.  Eyes:     General: No scleral icterus.    Conjunctiva/sclera: Conjunctivae normal.     Pupils: Pupils are equal, round, and reactive to light.  Neck:     Trachea: No tracheal deviation.  Cardiovascular:     Rate and Rhythm: Normal rate and regular rhythm.     Pulses: Normal pulses.     Heart sounds: Normal heart sounds. No murmur heard.    No friction rub. No gallop.  Pulmonary:     Effort: Pulmonary effort is normal. No accessory muscle usage or respiratory distress.     Breath sounds: Normal breath sounds.  Abdominal:     General: Bowel sounds are normal. There is no distension.      Palpations: Abdomen is soft. There is no mass.     Tenderness: There is no abdominal tenderness. There is no guarding.  Genitourinary:    Comments: No cva tenderness. Musculoskeletal:        General: No swelling or tenderness.     Cervical back: Normal range of motion and neck supple. No rigidity.     Right lower leg: No edema.     Left lower leg: No edema.  Skin:    General: Skin is warm and dry.     Findings: No rash.  Neurological:     Mental Status: He is alert.     Comments: Alert, speech clear. Motor/sens grossly intact bil.   Psychiatric:        Mood and Affect: Mood normal.     ED Results / Procedures / Treatments   Labs (all labs ordered are listed, but only abnormal results are displayed) Results for orders placed or performed during the hospital encounter of 06/09/23  Comprehensive metabolic panel  Result Value Ref Range   Sodium 130 (L) 135 - 145 mmol/L   Potassium 4.6 3.5 - 5.1 mmol/L   Chloride 98 98 - 111 mmol/L   CO2 24 22 - 32 mmol/L   Glucose, Bld 374 (H) 70 - 99 mg/dL   BUN 26 (H) 8 - 23 mg/dL   Creatinine, Ser 9.60 (H) 0.61 - 1.24 mg/dL   Calcium 9.0 8.9 - 45.4 mg/dL   Total Protein 8.2 (H) 6.5 - 8.1 g/dL   Albumin 3.4 (L) 3.5 - 5.0 g/dL   AST 20 15 - 41 U/L   ALT 17 0 - 44 U/L   Alkaline Phosphatase 85 38 - 126 U/L   Total Bilirubin 0.7 0.3 - 1.2 mg/dL   GFR, Estimated 39 (L) >60 mL/min   Anion gap 8 5 - 15  CBC  Result Value Ref Range   WBC 11.7 (H) 4.0 - 10.5 K/uL   RBC 3.89 (L) 4.22 - 5.81 MIL/uL   Hemoglobin 11.8 (L) 13.0 - 17.0 g/dL   HCT 09.8 (L) 11.9 - 14.7 %   MCV 96.9 80.0 - 100.0 fL   MCH 30.3 26.0 - 34.0 pg   MCHC 31.3 30.0 - 36.0 g/dL   RDW 82.9 56.2 - 13.0 %   Platelets 192 150 - 400 K/uL   nRBC 0.0 0.0 - 0.2 %  Urinalysis, Routine w reflex microscopic -Urine, Clean Catch  Result Value Ref Range   Color, Urine YELLOW YELLOW   APPearance CLOUDY (A) CLEAR   Specific Gravity, Urine 1.015 1.005 - 1.030   pH 5.0 5.0 -  8.0    Glucose, UA >=500 (A) NEGATIVE mg/dL   Hgb urine dipstick MODERATE (A) NEGATIVE   Bilirubin Urine NEGATIVE NEGATIVE   Ketones, ur NEGATIVE NEGATIVE mg/dL   Protein, ur 213 (A) NEGATIVE mg/dL   Nitrite NEGATIVE NEGATIVE   Leukocytes,Ua LARGE (A) NEGATIVE   RBC / HPF 6-10 0 - 5 RBC/hpf   WBC, UA >50 0 - 5 WBC/hpf   Bacteria, UA MANY (A) NONE SEEN   Squamous Epithelial / HPF 0-5 0 - 5 /HPF   WBC Clumps PRESENT    Hyaline Casts, UA PRESENT   CBG monitoring, ED  Result Value Ref Range   Glucose-Capillary 359 (H) 70 - 99 mg/dL  POC CBG, ED  Result Value Ref Range   Glucose-Capillary 275 (H) 70 - 99 mg/dL      EKG None  Radiology No results found.  Procedures Procedures    Medications Ordered in ED Medications  cefTRIAXone (ROCEPHIN) 1 g in sodium chloride 0.9 % 100 mL IVPB (has no administration in time range)  sodium chloride 0.9 % bolus 1,000 mL (0 mLs Intravenous Stopped 06/09/23 1859)  insulin aspart (novoLOG) injection 10 Units (10 Units Subcutaneous Given 06/09/23 1653)  lactated ringers bolus 1,000 mL (1,000 mLs Intravenous New Bag/Given 06/09/23 1905)    ED Course/ Medical Decision Making/ A&P                                 Medical Decision Making Problems Addressed: Acute UTI: acute illness or injury with systemic symptoms that poses a threat to life or bodily functions AKI (acute kidney injury) [facility]): acute illness or injury with systemic symptoms that poses a threat to life or bodily functions Dehydration: acute illness or injury with systemic symptoms that poses a threat to life or bodily functions Generalized weakness: acute illness or injury with systemic symptoms Hyperglycemia: acute illness or injury with systemic symptoms that poses a threat to life or bodily functions Non compliance w medication regimen: acute illness or injury  Amount and/or Complexity of Data Reviewed Independent Historian: spouse    Details: hx External Data Reviewed: labs and  notes. Labs: ordered. Decision-making details documented in ED Course.  Risk Prescription drug management. Decision regarding hospitalization.   Iv ns. Continuous pulse ox and cardiac monitoring. Labs ordered/sent.  Differential diagnosis includes dka, hyperglycemia, dehydration, aki, etc. Dispo decision including potential need for admission considered - will get labs and reassess.   Reviewed nursing notes and prior charts for additional history. External reports reviewed. Additional history from: spouse.  Cardiac monitor: sinus rhythm, rate 90.  IV ns bolus. Novolog sq.   Labs reviewed/interpreted by me - glcusoe high 374. Hco3 normal. AG normal. UA c/w uti. Cx sent. Rocephin iv.   Additional ivf. Po fluids.   Repeat cbg - improved.   Given hyperglycemia, aki, uti, will admit.  Hospitalists consulted for admission.          Final Clinical Impression(s) / ED Diagnoses Final diagnoses:  Hyperglycemia  AKI (acute kidney injury) (HCC)  Dehydration  Acute UTI  Generalized weakness  Non compliance w medication regimen    Rx / DC Orders ED Discharge Orders     None         Cathren Laine, MD 06/09/23 1951

## 2023-06-09 NOTE — H&P (Signed)
History and Physical    Wyatt Meyer YQM:[id number] DOB: 07-07-1961 DOA: 06/09/2023  PCP: Ardith Dark, MD  Patient coming from: Home  I have personally briefly reviewed patient's old medical records in [facility]  Chief Complaint: Generalized weakness, fatigue, dizziness  HPI: Wyatt Meyer is a 62 y.o. male with medical history significant for insulin-dependent T2DM, HLD, iron deficiency anemia, prostate cancer s/p prostatectomy, colon cancer s/p right hemicolectomy in remission who presents to the ED for evaluation of fatigue, dizziness, polyuria, polydipsia.  Patient states that he has not been taking his home insulin over the last 3 days due to issues with his freestyle libre not working.  He was unable to find his manual glucometer either.  Since he cannot check his blood sugar he has not been taking his insulin.  Since then he has developed polydipsia, polyuria.  He noted malodorous and cloudy urine.  He has been very fatigued and slept through most of the weekend.  Today he was getting lightheaded, dizzy, and clammy.  He initially went to [facility] urgent care.  Labs were obtained and showed creatinine 1.9 and serum glucose 382.  He was also orthostatic with BP dropping to 85/55 on standing.  He was sent to the ED for further evaluation.  Patient denies chest pain, dyspnea, abdominal pain.  He has had some nausea but no emesis.  He denies diarrhea.  ED Course  Labs/Imaging on admission: I have personally reviewed following labs and imaging studies.  Initial vitals showed BP 123/65, pulse 92, RR 18, temp 99.1 F, SpO2 97% on room air.  Labs show sodium 130 (137 when corrected for hyperglycemia), potassium 4.6, bicarb 24, BUN 26, creatinine 1.89 (1.29 on 02/05/2023), serum glucose 374, LFTs within normal limits, WBC 11.7, hemoglobin 11.8, platelets 192,000.  UA shows negative nitrates, large leukocytes, 6-10 RBCs, >50 WBCs, many bacteria on microscopy.  Urine  culture in process.  Patient was given 2 L IV fluid, 10 units subcutaneous NovoLog, IV ceftriaxone.  The hospitalist service was consulted to admit for further evaluation and management.  Review of Systems: All systems reviewed and are negative except as documented in history of present illness above.   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/23/2016   Hyperlipidemia    on meds   Iron deficiency anemia    on IRON and Geritol   Kidney stone    Klebsiella sepsis (HCC) 03/25/2015   Osteoarthritis 08/11/2012   Peripheral neuropathy 08/11/2012   Prostate cancer (HCC)    Smoker    SUI (stress urinary incontinence), male 02/03/2013   Uncontrolled diabetes mellitus with complications 03/23/2015   on meds    Past Surgical History:  Procedure Laterality Date   COLONOSCOPY  01/27/2021   Dr.Beavers   COLONOSCOPY WITH PROPOFOL N/A 06/11/2022   Procedure: COLONOSCOPY WITH PROPOFOL;  Surgeon: Tressia Danas, MD;  Location: [facility] ENDOSCOPY;  Service: Gastroenterology;  Laterality: N/A;   LAPAROSCOPIC RIGHT HEMI COLECTOMY Right 05/03/2021   Procedure: LAPAROSCOPIC RIGHT HEMI COLECTOMY WITH LYSIS OF ADHESIONS AND BILATERAL TAP BLOCK;  Surgeon: Andria Meuse, MD;  Location: [facility] ORS;  Service: General;  Laterality: Right;   PROSTATE SURGERY  09/25/2011   PROSTATE SURGERY     WISDOM TOOTH EXTRACTION      Social History:  reports that he has been smoking cigarettes. He has never used smokeless tobacco. He reports current alcohol use. He  reports that he does not use drugs.  Allergies  Allergen Reactions   Codeine Nausea And Vomiting    Family History  Problem Relation Age of Onset   Hyperlipidemia Mother    Diabetes Mother    Heart disease Father    Hyperlipidemia Sister    Thyroid cancer Sister    Thyroid cancer Sister    Esophageal cancer Neg Hx    Rectal cancer Neg Hx    Colon cancer Neg Hx    Stomach  cancer Neg Hx    Colon polyps Neg Hx      Prior to Admission medications   Medication Sig Start Date End Date Taking? Authorizing Provider  azelastine (ASTELIN) 0.1 % nasal spray Place 2 sprays into both nostrils 2 (two) times daily. 12/14/22   Ardith Dark, MD  Continuous Glucose Sensor (FREESTYLE LIBRE 2 SENSOR) MISC USE AS DIRECTED TO CHECK BS 04/16/23   Ardith Dark, MD  diclofenac (VOLTAREN) 75 MG EC tablet Take 1 tablet (75 mg total) by mouth 2 (two) times daily. 01/28/23   Ardith Dark, MD  insulin glargine (LANTUS SOLOSTAR) 100 UNIT/ML Solostar Pen Inject 60 Units into the skin daily. 12/14/22   Ardith Dark, MD  insulin lispro (HUMALOG) 100 UNIT/ML KwikPen Inject 5-15 Units into the skin 3 (three) times daily. Use according to sliding scale 12/14/22   Ardith Dark, MD  Insulin Pen Needle (BD PEN NEEDLE NANO U/F) 32G X 4 MM MISC use four times a day 03/31/21   Ardith Dark, MD  ONE [facility] LANCETS MISC Use 3x a day 01/17/18   Carlus Pavlov, MD  tirzepatide [facility]) 2.5 MG/0.5ML Pen Inject 2.5 mg into the skin once a week. 02/13/23   Ardith Dark, MD  tirzepatide [facility]) 5 MG/0.5ML Pen Inject 5 mg into the skin once a week. 02/13/23   Ardith Dark, MD    Physical Exam: Vitals:   06/09/23 1947 06/09/23 2000 06/09/23 2030 06/09/23 2124  BP:  (!) 169/88 (!) 166/87 (!) 151/79  Pulse:  86 84 86  Resp:    18  Temp: 99.1 F (37.3 C)   99 F (37.2 C)  TempSrc: Oral   Oral  SpO2:  100% 99% 100%  Weight:      Height:       Constitutional: Resting in bed, NAD, calm, comfortable Eyes: EOMI, lids and conjunctivae normal ENMT: Mucous membranes are moist. Posterior pharynx clear of any exudate or lesions.Normal dentition.  Neck: normal, supple, no masses. Respiratory: clear to auscultation bilaterally, no wheezing, no crackles. Normal respiratory effort. No accessory muscle use.  Cardiovascular: Regular rate and rhythm, no murmurs / rubs / gallops. No extremity edema.  2+ pedal pulses. Abdomen: no tenderness, no masses palpated. Musculoskeletal: no clubbing / cyanosis. No joint deformity upper and lower extremities. Good ROM, no contractures. Normal muscle tone.  Skin: no rashes, lesions, ulcers. No induration Neurologic: CN 2-12 grossly intact. Sensation intact. Strength 5/5 in all 4.  Psychiatric: Normal judgment and insight. Alert and oriented x 3. Normal mood.   EKG: Not performed.  Assessment/Plan Principal Problem:   AKI (acute kidney injury) (HCC) Active Problems:   Type 2 diabetes mellitus with hyperglycemia (HCC)   Urinary tract infection   Wyatt Meyer is a 62 y.o. male with medical history significant for insulin-dependent T2DM, HLD, iron deficiency anemia, prostate cancer s/p prostatectomy, colon cancer s/p right hemicolectomy in remission who is admitted with AKI, UTI, hyperglycemia.  Assessment  and Plan: Acute kidney injury: Creatinine 1.9 compared to previous 1.29 02/05/2023.  Secondary to hypovolemia.  Orthostatic vitals were positive at urgent care. -Continue IV fluid hydration overnight -Repeat labs in a.m.  Insulin-dependent type 2 diabetes with hyperglycemia: Hyperglycemic without evidence of DKA/HHS.  Due to not taking insulin when he has been unable to check his blood sugars. -Reduced Semglee 30 units nightly -Placed on SSI  Urinary tract infection: Continue IV ceftriaxone.  Follow urine culture.   DVT prophylaxis: enoxaparin (LOVENOX) injection 40 mg Start: 06/10/23 1000 Code Status: Full code, confirmed with patient on admission Family Communication: Spouse at bedside Disposition Plan: From home and likely discharge to home in 1-2 days Consults called: None Severity of Illness: The appropriate patient status for this patient is OBSERVATION. Observation status is judged to be reasonable and necessary in order to provide the required intensity of service to ensure the patient's safety. The patient's presenting  symptoms, physical exam findings, and initial radiographic and laboratory data in the context of their medical condition is felt to place them at decreased risk for further clinical deterioration. Furthermore, it is anticipated that the patient will be medically stable for discharge from the hospital within 2 midnights of admission.   Darreld Mclean MD [facility]  If 7PM-7AM, please contact night-coverage [email or web address]  06/09/2023, 10:11 PM

## 2023-06-09 NOTE — ED Notes (Signed)
ED TO INPATIENT HANDOFF REPORT  ED Nurse Name and Phone #: Raynelle Fanning, RN  S Name/Age/Gender Wyatt Meyer 62 y.o. male Room/Bed: WA18/WA18  Code Status   Code Status: Prior  Home/SNF/Other Home Patient oriented to: self, place, time, and situation Is this baseline? Yes   Triage Complete: Triage complete  Chief Complaint AKI (acute kidney injury) (HCC) [N17.9]  Triage Note Pt arrived with fatigue, dizziness and not feeling well over the past weekend. Pain across back and lower abdomen. Hx of Diabetes. Went to urgent care  today, states was sent over for eval. States BUN and creatinine were abnormal. Denies shob,chest pain or any other symptoms   Allergies Allergies  Allergen Reactions   Codeine Nausea And Vomiting    Level of Care/Admitting Diagnosis ED Disposition     ED Disposition  Admit   Condition  --   Comment  Hospital Area: [facility] [[id number]]  Level of Care: Med-Surg [16]  May place patient in observation at [facility] or [facility] if equivalent level of care is available:: No  Covid Evaluation: Asymptomatic - no recent exposure (last 10 days) testing not required  Diagnosis: AKI (acute kidney injury) [facility]) [[id number]]  Admitting Physician: Charlsie Quest [[id number]]  Attending Physician: Charlsie Quest [[id number]]          B Medical/Surgery History 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/23/2016   Hyperlipidemia    on meds   Iron deficiency anemia    on IRON and Geritol   Kidney stone    Klebsiella sepsis (HCC) 03/25/2015   Osteoarthritis 08/11/2012   Peripheral neuropathy 08/11/2012   Prostate cancer (HCC)    Smoker    SUI (stress urinary incontinence), male 02/03/2013   Uncontrolled diabetes mellitus with complications 03/23/2015   on meds   Past Surgical History:  Procedure Laterality Date   COLONOSCOPY  01/27/2021   Dr.Beavers   COLONOSCOPY  WITH PROPOFOL N/A 06/11/2022   Procedure: COLONOSCOPY WITH PROPOFOL;  Surgeon: Tressia Danas, MD;  Location: [facility] ENDOSCOPY;  Service: Gastroenterology;  Laterality: N/A;   LAPAROSCOPIC RIGHT HEMI COLECTOMY Right 05/03/2021   Procedure: LAPAROSCOPIC RIGHT HEMI COLECTOMY WITH LYSIS OF ADHESIONS AND BILATERAL TAP BLOCK;  Surgeon: Andria Meuse, MD;  Location: [facility] ORS;  Service: General;  Laterality: Right;   PROSTATE SURGERY  09/25/2011   PROSTATE SURGERY     WISDOM TOOTH EXTRACTION       A IV Location/Drains/Wounds Patient Lines/Drains/Airways Status     Active Line/Drains/Airways     Name Placement date Placement time Site Days   Peripheral IV 06/09/23 20 G 1" Anterior;Distal;Left;Upper Antecubital 06/09/23  1657  Antecubital  less than 1   Incision - 3 Ports Abdomen Right Left;Upper Left 05/03/21  1335  -- 767            Intake/Output Last 24 hours  Intake/Output Summary (Last 24 hours) at 06/09/2023 2055 Last data filed at 06/09/2023 2054 Gross per 24 hour  Intake 1099 ml  Output --  Net 1099 ml    Labs/Imaging Results for orders placed or performed during the hospital encounter of 06/09/23 (from the past 48 hour(s))  CBG monitoring, ED     Status: Abnormal   Collection Time: 06/09/23  3:47 PM  Result Value Ref Range   Glucose-Capillary 359 (H) 70 - 99 mg/dL    Comment: Glucose reference range applies only to samples  taken after fasting for at least 8 hours.  Comprehensive metabolic panel     Status: Abnormal   Collection Time: 06/09/23  4:54 PM  Result Value Ref Range   Sodium 130 (L) 135 - 145 mmol/L   Potassium 4.6 3.5 - 5.1 mmol/L   Chloride 98 98 - 111 mmol/L   CO2 24 22 - 32 mmol/L   Glucose, Bld 374 (H) 70 - 99 mg/dL    Comment: Glucose reference range applies only to samples taken after fasting for at least 8 hours.   BUN 26 (H) 8 - 23 mg/dL   Creatinine, Ser 8.29 (H) 0.61 - 1.24 mg/dL   Calcium 9.0 8.9 - 56.2 mg/dL   Total Protein 8.2 (H) 6.5 -  8.1 g/dL   Albumin 3.4 (L) 3.5 - 5.0 g/dL   AST 20 15 - 41 U/L   ALT 17 0 - 44 U/L   Alkaline Phosphatase 85 38 - 126 U/L   Total Bilirubin 0.7 0.3 - 1.2 mg/dL   GFR, Estimated 39 (L) >60 mL/min    Comment: (NOTE) Calculated using the CKD-EPI Creatinine Equation (2021)    Anion gap 8 5 - 15    Comment: Performed at [facility], [address], [place], [place] [address]  CBC     Status: Abnormal   Collection Time: 06/09/23  4:54 PM  Result Value Ref Range   WBC 11.7 (H) 4.0 - 10.5 K/uL   RBC 3.89 (L) 4.22 - 5.81 MIL/uL   Hemoglobin 11.8 (L) 13.0 - 17.0 g/dL   HCT 57.8 (L) 46.9 - 62.9 %   MCV 96.9 80.0 - 100.0 fL   MCH 30.3 26.0 - 34.0 pg   MCHC 31.3 30.0 - 36.0 g/dL   RDW 52.8 41.3 - 24.4 %   Platelets 192 150 - 400 K/uL   nRBC 0.0 0.0 - 0.2 %    Comment: Performed at [facility], [address], [place], [place] [address]  POC CBG, ED     Status: Abnormal   Collection Time: 06/09/23  7:04 PM  Result Value Ref Range   Glucose-Capillary 275 (H) 70 - 99 mg/dL    Comment: Glucose reference range applies only to samples taken after fasting for at least 8 hours.  Urinalysis, Routine w reflex microscopic -Urine, Clean Catch     Status: Abnormal   Collection Time: 06/09/23  7:19 PM  Result Value Ref Range   Color, Urine YELLOW YELLOW   APPearance CLOUDY (A) CLEAR   Specific Gravity, Urine 1.015 1.005 - 1.030   pH 5.0 5.0 - 8.0   Glucose, UA >=500 (A) NEGATIVE mg/dL   Hgb urine dipstick MODERATE (A) NEGATIVE   Bilirubin Urine NEGATIVE NEGATIVE   Ketones, ur NEGATIVE NEGATIVE mg/dL   Protein, ur 253 (A) NEGATIVE mg/dL   Nitrite NEGATIVE NEGATIVE   Leukocytes,Ua LARGE (A) NEGATIVE   RBC / HPF 6-10 0 - 5 RBC/hpf   WBC, UA >50 0 - 5 WBC/hpf   Bacteria, UA MANY (A) NONE SEEN   Squamous Epithelial / HPF 0-5 0 - 5 /HPF   WBC Clumps PRESENT    Hyaline Casts, UA PRESENT     Comment: Performed at [facility], [address] [place], [place] [address]  POC CBG, ED     Status: Abnormal   Collection Time: 06/09/23  8:01 PM  Result Value Ref Range   Glucose-Capillary 235 (H) 70 - 99 mg/dL  Comment: Glucose reference range applies only to samples taken after fasting for at least 8 hours.   No results found.  Pending Labs Unresulted Labs (From admission, onward)     Start     Ordered   06/09/23 1949  Urine Culture  Once,   URGENT       Question:  Indication  Answer:  Dysuria   06/09/23 1949            Vitals/Pain Today's Vitals   06/09/23 1930 06/09/23 1947 06/09/23 2000 06/09/23 2030  BP: (!) 141/75  (!) 169/88 (!) 166/87  Pulse: 88  86 84  Resp:      Temp:  99.1 F (37.3 C)    TempSrc:  Oral    SpO2: 100%  100% 99%  Weight:      Height:        Isolation Precautions No active isolations  Medications Medications  sodium chloride 0.9 % bolus 1,000 mL (0 mLs Intravenous Stopped 06/09/23 1859)  insulin aspart (novoLOG) injection 10 Units (10 Units Subcutaneous Given 06/09/23 1653)  lactated ringers bolus 1,000 mL (1,000 mLs Intravenous New Bag/Given 06/09/23 1905)  cefTRIAXone (ROCEPHIN) 1 g in sodium chloride 0.9 % 100 mL IVPB (0 g Intravenous Stopped 06/09/23 2054)    Mobility walks     Focused Assessments Cardiac Assessment Handoff:    No results found for: "CKTOTAL", "CKMB", "CKMBINDEX", "TROPONINI" No results found for: "DDIMER" Does the Patient currently have chest pain? No    R Recommendations: See Admitting Provider Note  Report given to:   Additional Notes: Pt is being admitted for AKI. He is A&O x 4

## 2023-06-09 NOTE — Hospital Course (Signed)
Wyatt Meyer is a 62 y.o. male with medical history significant for insulin-dependent T2DM, HLD, iron deficiency anemia, prostate cancer s/p prostatectomy, colon cancer s/p right hemicolectomy in remission who is admitted with AKI, UTI, hyperglycemia. He had been unable to check glucose levels at home and therefore stopped taking his insulin.  He also had developed decreased appetite and was eating/drinking less.  On admission he was found to have creatinine 1.89, hyperglycemia, 382, and hypotension, 85/55. He was admitted for fluid resuscitation and resuming his insulin. Creatinine trended back towards baseline and was 1.39 at discharge. He was still orthostatic and underwent further fluid bolus prior to discharge with recommendations for increasing hydration and oral intake for a few days at discharge. He had also described malodorous urine on admission.  UA was notable for large LE, negative nitrite, many bacteria, greater than 50 WBC.  He was started on Rocephin on admission and will continue on a course of cefadroxil at discharge.  Urine culture pending at discharge and will be followed up. Given overall improvement and stability, he was discharged home.

## 2023-06-09 NOTE — ED Triage Notes (Signed)
Pt arrived with fatigue, dizziness and not feeling well over the past weekend. Pain across back and lower abdomen. Hx of Diabetes. Went to urgent care  today, states was sent over for eval. States BUN and creatinine were abnormal. Denies shob,chest pain or any other symptoms

## 2023-06-10 DIAGNOSIS — Z794 Long term (current) use of insulin: Secondary | ICD-10-CM | POA: Diagnosis not present

## 2023-06-10 DIAGNOSIS — N179 Acute kidney failure, unspecified: Secondary | ICD-10-CM | POA: Diagnosis not present

## 2023-06-10 DIAGNOSIS — E1165 Type 2 diabetes mellitus with hyperglycemia: Secondary | ICD-10-CM | POA: Diagnosis not present

## 2023-06-10 DIAGNOSIS — N3001 Acute cystitis with hematuria: Secondary | ICD-10-CM | POA: Diagnosis not present

## 2023-06-10 LAB — CBC
HCT: 32.4 % — ABNORMAL LOW (ref 39.0–52.0)
Hemoglobin: 10.2 g/dL — ABNORMAL LOW (ref 13.0–17.0)
MCH: 30.4 pg (ref 26.0–34.0)
MCHC: 31.5 g/dL (ref 30.0–36.0)
MCV: 96.4 fL (ref 80.0–100.0)
Platelets: 180 10*3/uL (ref 150–400)
RBC: 3.36 MIL/uL — ABNORMAL LOW (ref 4.22–5.81)
RDW: 12.9 % (ref 11.5–15.5)
WBC: 9.2 10*3/uL (ref 4.0–10.5)
nRBC: 0 % (ref 0.0–0.2)

## 2023-06-10 LAB — GLUCOSE, CAPILLARY
Glucose-Capillary: 152 mg/dL — ABNORMAL HIGH (ref 70–99)
Glucose-Capillary: 132 mg/dL — ABNORMAL HIGH (ref 70–99)

## 2023-06-10 LAB — BASIC METABOLIC PANEL
Anion gap: 9 (ref 5–15)
BUN: 24 mg/dL — ABNORMAL HIGH (ref 8–23)
CO2: 23 mmol/L (ref 22–32)
Calcium: 8.6 mg/dL — ABNORMAL LOW (ref 8.9–10.3)
Chloride: 103 mmol/L (ref 98–111)
Creatinine, Ser: 1.39 mg/dL — ABNORMAL HIGH (ref 0.61–1.24)
GFR, Estimated: 57 mL/min — ABNORMAL LOW (ref >60)
Glucose, Bld: 190 mg/dL — ABNORMAL HIGH (ref 70–99)
Potassium: 4 mmol/L (ref 3.5–5.1)
Sodium: 135 mmol/L (ref 135–145)

## 2023-06-10 LAB — HIV ANTIBODY (ROUTINE TESTING W REFLEX): HIV Screen 4th Generation wRfx: NONREACTIVE

## 2023-06-10 MED ORDER — SODIUM CHLORIDE 0.9 % IV BOLUS
1000.0000 mL | Freq: Once | INTRAVENOUS | Status: AC
  Administered 2023-06-10: 1000 mL via INTRAVENOUS

## 2023-06-10 MED ORDER — CEFADROXIL 500 MG PO CAPS: 1000.0000 mg | ORAL_CAPSULE | Freq: Two times a day (BID) | ORAL | 0 refills | Status: AC

## 2023-06-10 MED ORDER — CEFADROXIL 500 MG PO CAPS
1000.0000 mg | ORAL_CAPSULE | Freq: Two times a day (BID) | ORAL | Status: DC
  Administered 2023-06-10: 1000 mg via ORAL
  Filled 2023-06-10: qty 2

## 2023-06-10 MED ORDER — INSULIN LISPRO (1 UNIT DIAL) 100 UNIT/ML (KWIKPEN): 2.0000 [IU] | PEN_INJECTOR | Freq: Three times a day (TID) | SUBCUTANEOUS | Status: DC

## 2023-06-10 NOTE — Progress Notes (Signed)
The patient is stable and reports feeling well at this time. He denies dizziness weakness, or lightheadedness. Discharge instructions were reviewed,questions, concerns denied at this time.

## 2023-06-10 NOTE — Discharge Summary (Signed)
Physician Discharge Summary   Wyatt Meyer UUV:[id number] DOB: [birth year] DOA: 06/09/2023  PCP: Ardith Dark, MD  Admit date: 06/09/2023 Discharge date: 06/10/2023   Admitted From: Home Disposition:  Home Discharging physician: Lewie Chamber, MD Barriers to discharge: none  Recommendations at discharge: Continue routine A1c checks Repeat UA; if still evidence of proteinuria or hematuria consider ACEi/ARB and/or referral to urology for persistent hematuria  Discharge Condition: stable CODE STATUS: Full Diet recommendation:  Diet Orders (From admission, onward)     Start     Ordered   06/10/23 0000  Diet - low sodium heart healthy        06/10/23 1123   06/10/23 0000  Diet Carb Modified        06/10/23 1123   06/09/23 2146  Diet heart healthy/carb modified Room service appropriate? Yes; Fluid consistency: Thin  Diet effective now       Question Answer Comment  Diet-HS Snack? Nothing   Room service appropriate? Yes   Fluid consistency: Thin      06/09/23 2146            Hospital Course: Wyatt Meyer is a 62 y.o. male with medical history significant for insulin-dependent T2DM, HLD, iron deficiency anemia, prostate cancer s/p prostatectomy, colon cancer s/p right hemicolectomy in remission who is admitted with AKI, UTI, hyperglycemia. He had been unable to check glucose levels at home and therefore stopped taking his insulin.  He also had developed decreased appetite and was eating/drinking less.  On admission he was found to have creatinine 1.89, hyperglycemia, 382, and hypotension, 85/55. He was admitted for fluid resuscitation and resuming his insulin. Creatinine trended back towards baseline and was 1.39 at discharge. He was still orthostatic and underwent further fluid bolus prior to discharge with recommendations for increasing hydration and oral intake for a few days at discharge. He had also described malodorous urine on admission.  UA was notable for large  LE, negative nitrite, many bacteria, greater than 50 WBC.  He was started on Rocephin on admission and will continue on a course of cefadroxil at discharge.  Urine culture pending at discharge and will be followed up. Given overall improvement and stability, he was discharged home.   The patient's acute and chronic medical conditions were treated accordingly. On day of discharge, patient was felt deemed stable for discharge. Patient/family member advised to call PCP or come back to ER if needed.   Principal Diagnosis: AKI (acute kidney injury) [facility])  Discharge Diagnoses: Active Hospital Problems   Diagnosis Date Noted   AKI (acute kidney injury) (HCC) 06/09/2023    Priority: 1.   Urinary tract infection 06/09/2023   Type 2 diabetes mellitus with hyperglycemia (HCC) 02/15/2017    Resolved Hospital Problems  No resolved problems to display.     Discharge Instructions     Diet - low sodium heart healthy   Complete by: As directed    Diet Carb Modified   Complete by: As directed    Increase activity slowly   Complete by: As directed       Allergies as of 06/10/2023       Reactions   Codeine Nausea And Vomiting        Medication List     TAKE these medications    BD Pen Needle Nano U/F 32G X 4 MM Misc Generic drug: Insulin Pen Needle use four times a day   cefadroxil 500 MG capsule Commonly known as: DURICEF Take 2 capsules (  1,000 mg total) by mouth 2 (two) times daily for 4 days.   FreeStyle Libre 2 Sensor Misc USE AS DIRECTED TO CHECK BS   insulin lispro 100 UNIT/ML KwikPen Commonly known as: HUMALOG Inject 2-15 Units into the skin 4 (four) times daily -  before meals and at bedtime. CBG 121 - 150: 2 units, CBG 151 - 200: 3 units, CBG 201 - 250: 5 units, CBG 251 - 300:  8 units, CBG 301 - 350: 11 units, CBG 351 - 400: 15 units, CBG > 400 call MD What changed:  how much to take when to take this additional instructions   Lantus SoloStar 100 UNIT/ML Solostar  Pen Generic drug: insulin glargine Inject 60 Units into the skin daily.   ONE TOUCH LANCETS Misc Use 3x a day        Allergies  Allergen Reactions   Codeine Nausea And Vomiting    Consultations:   Procedures:   Discharge Exam: BP (!) 149/75 (BP Location: Right Arm)   Pulse 83   Temp 98.5 F (36.9 C)   Resp 16   Ht 6\' 1"  (1.854 m)   Wt 106.6 kg   SpO2 99%   BMI 31.00 kg/m  Physical Exam Constitutional:      General: He is not in acute distress.    Appearance: Normal appearance.  HENT:     Head: Normocephalic and atraumatic.     Mouth/Throat:     Mouth: Mucous membranes are moist.  Eyes:     Extraocular Movements: Extraocular movements intact.  Cardiovascular:     Rate and Rhythm: Normal rate and regular rhythm.  Pulmonary:     Effort: Pulmonary effort is normal. No respiratory distress.     Breath sounds: Normal breath sounds. No wheezing.  Abdominal:     General: Bowel sounds are normal. There is no distension.     Palpations: Abdomen is soft.     Tenderness: There is no abdominal tenderness.  Musculoskeletal:        General: Normal range of motion.     Cervical back: Normal range of motion and neck supple.  Skin:    General: Skin is warm and dry.  Neurological:     General: No focal deficit present.     Mental Status: He is alert.  Psychiatric:        Mood and Affect: Mood normal.        Behavior: Behavior normal.      The results of significant diagnostics from this hospitalization (including imaging, microbiology, ancillary and laboratory) are listed below for reference.   Microbiology: No results found for this or any previous visit (from the past 240 hour(s)).   Labs: BNP (last 3 results) No results for input(s): "BNP" in the last 8760 hours. Basic Metabolic Panel: Recent Labs  Lab 06/09/23 1654 06/10/23 0336  NA 130* 135  K 4.6 4.0  CL 98 103  CO2 24 23  GLUCOSE 374* 190*  BUN 26* 24*  CREATININE 1.89* 1.39*  CALCIUM 9.0 8.6*    Liver Function Tests: Recent Labs  Lab 06/09/23 1654  AST 20  ALT 17  ALKPHOS 85  BILITOT 0.7  PROT 8.2*  ALBUMIN 3.4*   No results for input(s): "LIPASE", "AMYLASE" in the last 168 hours. No results for input(s): "AMMONIA" in the last 168 hours. CBC: Recent Labs  Lab 06/09/23 1654 06/10/23 0336  WBC 11.7* 9.2  HGB 11.8* 10.2*  HCT 37.7* 32.4*  MCV 96.9 96.4  PLT 192 180   Cardiac Enzymes: No results for input(s): "CKTOTAL", "CKMB", "CKMBINDEX", "TROPONINI" in the last 168 hours. BNP: Invalid input(s): "POCBNP" CBG: Recent Labs  Lab 06/09/23 1904 06/09/23 2001 06/09/23 2242 06/10/23 0721 06/10/23 1139  GLUCAP 275* 235* 225* 152* 132*   D-Dimer No results for input(s): "DDIMER" in the last 72 hours. Hgb A1c No results for input(s): "HGBA1C" in the last 72 hours. Lipid Profile No results for input(s): "CHOL", "HDL", "LDLCALC", "TRIG", "CHOLHDL", "LDLDIRECT" in the last 72 hours. Thyroid function studies No results for input(s): "TSH", "T4TOTAL", "T3FREE", "THYROIDAB" in the last 72 hours.  Invalid input(s): "FREET3" Anemia work up No results for input(s): "VITAMINB12", "FOLATE", "FERRITIN", "TIBC", "IRON", "RETICCTPCT" in the last 72 hours. Urinalysis    Component Value Date/Time   COLORURINE YELLOW 06/09/2023 1919   APPEARANCEUR CLOUDY (A) 06/09/2023 1919   LABSPEC 1.015 06/09/2023 1919   PHURINE 5.0 06/09/2023 1919   GLUCOSEU >=500 (A) 06/09/2023 1919   GLUCOSEU >=1000 (A) 01/04/2017 0806   HGBUR MODERATE (A) 06/09/2023 1919   BILIRUBINUR NEGATIVE 06/09/2023 1919   BILIRUBINUR Negative 04/18/2023 1559   KETONESUR NEGATIVE 06/09/2023 1919   PROTEINUR 100 (A) 06/09/2023 1919   UROBILINOGEN 0.2 (A) 04/18/2023 1559   UROBILINOGEN 0.2 01/04/2017 0806   NITRITE NEGATIVE 06/09/2023 1919   LEUKOCYTESUR LARGE (A) 06/09/2023 1919   Sepsis Labs Recent Labs  Lab 06/09/23 1654 06/10/23 0336  WBC 11.7* 9.2   Microbiology No results found for this or  any previous visit (from the past 240 hour(s)).  Procedures/Studies: No results found.   Time coordinating discharge: Over 30 minutes    Lewie Chamber, MD  [facility] 06/10/2023, 2:47 PM

## 2023-06-11 ENCOUNTER — Telehealth: Payer: Self-pay

## 2023-06-11 LAB — URINE CULTURE: Culture: >=100000 — AB

## 2023-06-11 NOTE — Transitions of Care (Post Inpatient/ED Visit) (Unsigned)
06/11/2023  Name: Wyatt Meyer MRN: [id number] DOB: 1961-02-25  Today's TOC FU Call Status: Today's TOC FU Call Status:: Unsuccessful Call (1st Attempt) Unsuccessful Call (1st Attempt) Date: 06/11/23  Attempted to reach the patient regarding the most recent Inpatient/ED visit.  Follow Up Plan: Additional outreach attempts will be made to reach the patient to complete the Transitions of Care (Post Inpatient/ED visit) call.   Signature Karena Addison, LPN [facility] Nurse Health Advisor Direct Dial [phone]

## 2023-06-12 NOTE — Transitions of Care (Post Inpatient/ED Visit) (Unsigned)
06/12/2023  Name: Wyatt Meyer MRN: [id number] DOB: 06-23-1961  Today's TOC FU Call Status: Today's TOC FU Call Status:: Unsuccessful Call (2nd Attempt) Unsuccessful Call (1st Attempt) Date: 06/11/23 Unsuccessful Call (2nd Attempt) Date: 06/12/23  Attempted to reach the patient regarding the most recent Inpatient/ED visit.  Follow Up Plan: Additional outreach attempts will be made to reach the patient to complete the Transitions of Care (Post Inpatient/ED visit) call.   Signature Karena Addison, LPN [facility] Nurse Health Advisor Direct Dial [phone]

## 2023-06-13 NOTE — Transitions of Care (Post Inpatient/ED Visit) (Signed)
06/13/2023  Name: Wyatt Meyer MRN: [id number] DOB: 02-12-1961  Today's TOC FU Call Status: Today's TOC FU Call Status:: Unsuccessful Call (3rd Attempt) Unsuccessful Call (1st Attempt) Date: 06/11/23 Unsuccessful Call (2nd Attempt) Date: 06/12/23 Unsuccessful Call (3rd Attempt) Date: 06/13/23  Attempted to reach the patient regarding the most recent Inpatient/ED visit.  Follow Up Plan: No further outreach attempts will be made at this time. We have been unable to contact the patient.  Signature Karena Addison, LPN [facility] Nurse Health Advisor Direct Dial [phone]

## 2023-06-24 ENCOUNTER — Ambulatory Visit: Payer: BC Managed Care – PPO | Admitting: Internal Medicine

## 2023-06-24 NOTE — Progress Notes (Deleted)
Name: Wyatt Meyer  MRN/ DOB: [id number], 29-May-1961   Age/ Sex: 62 y.o., male    PCP: Ardith Dark, MD   Reason for Endocrinology Evaluation: Type 2 Diabetes Mellitus     Date of Initial Endocrinology Visit: 02/15/2017    PATIENT IDENTIFIER: Wyatt Meyer is a 62 y.o. male with a past medical history of DM, dyslipidemia, Hx right hemicolectomy secondary to colon cancer.. The patient presented for initial endocrinology clinic visit on 02/15/2017 for consultative assistance with his diabetes management.    HPI: [name] was    Diagnosed with DM in 2015 Prior Medications tried/Intolerance: he was started on insulin in 2017 Hemoglobin A1c has ranged from 9.6% in 2021, peaking at 12.5% in 2022.   Patient was seen by Dr. Elvera Lennox in 2018, transitioned to Dr. George Hugh care 10/2021, he was lost to follow-up until his return to our office by 05/2023  SUBJECTIVE:   During the last visit (11/22/2021): saw Dr. Everardo All   Today (06/24/23): [name] is here for a follow up on diabetes management. He checks his blood sugars *** times daily. The patient has *** had hypoglycemic episodes since the last clinic visit. The patient is *** symptomatic with these episodes.    HOME DIABETES REGIMEN: Lantus Humalog   Statin: No ACE-I/ARB: No  METER DOWNLOAD SUMMARY: Date range evaluated: *** Fingerstick Blood Glucose Tests = *** Average Number Tests/Day = *** Overall Mean FS Glucose = *** Standard Deviation = ***  BG Ranges: Low = *** High = ***   Hypoglycemic Events/30 Days: BG < 50 = *** Episodes of symptomatic severe hypoglycemia = ***   DIABETIC COMPLICATIONS: Microvascular complications:  *** Denies: CKD Last eye exam: Completed   Macrovascular complications:   Denies: CAD, PVD, CVA   PAST HISTORY: Past Medical History:  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/23/2016    Hyperlipidemia    on meds   Iron deficiency anemia    on IRON and Geritol   Kidney stone    Klebsiella sepsis (HCC) 03/25/2015   Osteoarthritis 08/11/2012   Peripheral neuropathy 08/11/2012   Prostate cancer (HCC)    Smoker    SUI (stress urinary incontinence), male 02/03/2013   Uncontrolled diabetes mellitus with complications 03/23/2015   on meds   Past Surgical History:  Past Surgical History:  Procedure Laterality Date   COLONOSCOPY  01/27/2021   Dr.Beavers   COLONOSCOPY WITH PROPOFOL N/A 06/11/2022   Procedure: COLONOSCOPY WITH PROPOFOL;  Surgeon: Tressia Danas, MD;  Location: [facility] ENDOSCOPY;  Service: Gastroenterology;  Laterality: N/A;   LAPAROSCOPIC RIGHT HEMI COLECTOMY Right 05/03/2021   Procedure: LAPAROSCOPIC RIGHT HEMI COLECTOMY WITH LYSIS OF ADHESIONS AND BILATERAL TAP BLOCK;  Surgeon: Andria Meuse, MD;  Location: [facility] ORS;  Service: General;  Laterality: Right;   PROSTATE SURGERY  09/25/2011   PROSTATE SURGERY     WISDOM TOOTH EXTRACTION      Social History:  reports that he has been smoking cigarettes. He has never used smokeless tobacco. He reports current alcohol use. He reports that he does not use drugs. Family History:  Family History  Problem Relation Age of Onset   Hyperlipidemia Mother    Diabetes Mother    Heart disease Father    Hyperlipidemia Sister    Thyroid cancer Sister    Thyroid cancer Sister    Esophageal cancer Neg Hx    Rectal cancer  Neg Hx    Colon cancer Neg Hx    Stomach cancer Neg Hx    Colon polyps Neg Hx      HOME MEDICATIONS: Allergies as of 06/24/2023       Reactions   Codeine Nausea And Vomiting        Medication List        Accurate as of June 24, 2023  6:45 AM. If you have any questions, ask your nurse or doctor.          BD Pen Needle Nano U/F 32G X 4 MM Misc Generic drug: Insulin Pen Needle use four times a day   FreeStyle Libre 2 Sensor Misc USE AS DIRECTED TO CHECK BS   insulin lispro 100  UNIT/ML KwikPen Commonly known as: HUMALOG Inject 2-15 Units into the skin 4 (four) times daily -  before meals and at bedtime. CBG 121 - 150: 2 units, CBG 151 - 200: 3 units, CBG 201 - 250: 5 units, CBG 251 - 300:  8 units, CBG 301 - 350: 11 units, CBG 351 - 400: 15 units, CBG > 400 call MD   Lantus SoloStar 100 UNIT/ML Solostar Pen Generic drug: insulin glargine Inject 60 Units into the skin daily.   ONE TOUCH LANCETS Misc Use 3x a day         ALLERGIES: Allergies  Allergen Reactions   Codeine Nausea And Vomiting     REVIEW OF SYSTEMS: A comprehensive ROS was conducted with the patient and is negative except as per HPI and below:  ROS    OBJECTIVE:   VITAL SIGNS: There were no vitals taken for this visit.   PHYSICAL EXAM:  General: Pt appears well and is in NAD  Neck: General: Supple without adenopathy or carotid bruits. Thyroid: Thyroid size normal.  No goiter or nodules appreciated.   Lungs: Clear with good BS bilat   Heart: RRR   Abdomen:  soft, nontender  Extremities:  Lower extremities - No pretibial edema.   Neuro: MS is good with appropriate affect, pt is alert and Ox3    DM foot exam:    DATA REVIEWED:  Lab Results  Component Value Date   HGBA1C 11.7 (A) 02/13/2023   HGBA1C 12.0 (A) 10/02/2022   HGBA1C 10.1 (A) 11/14/2021    Latest Reference Range & Units 06/10/23 03:36  Sodium 135 - 145 mmol/L 135  Potassium 3.5 - 5.1 mmol/L 4.0  Chloride 98 - 111 mmol/L 103  CO2 22 - 32 mmol/L 23  Glucose 70 - 99 mg/dL 865 (H)  BUN 8 - 23 mg/dL 24 (H)  Creatinine 7.84 - 1.24 mg/dL 6.96 (H)  Calcium 8.9 - 10.3 mg/dL 8.6 (L)  Anion gap 5 - 15  9  GFR, Estimated >60 mL/min 57 (L)    ASSESSMENT / PLAN / RECOMMENDATIONS:   1) Type 2 Diabetes Mellitus, ***controlled, With*** complications - Most recent A1c of *** %. Goal A1c < 7.0 %.    Plan: GENERAL: ***  MEDICATIONS: ***  EDUCATION / INSTRUCTIONS: BG monitoring instructions: Patient is instructed  to check his blood sugars *** times a day, ***. Call [facility] if: BG persistently < 70  I reviewed the Rule of 15 for the treatment of hypoglycemia in detail with the patient. Literature supplied.   2) Diabetic complications:  Eye: Does *** have known diabetic retinopathy.  Neuro/ Feet: Does *** have known diabetic peripheral neuropathy. Renal: Patient does not have known baseline CKD. He is  not on an ACEI/ARB at present.  3) Dyslipidemia:      Signed electronically by: Lyndle Herrlich, MD  [facility] Endocrinology  [facility] [address], [address] [place], [place] [address] Phone: [phone] FAX: [phone]   CC: Ardith Dark, MD [address] [place] [address] Phone: [phone]  Fax: [phone]    Return to Endocrinology clinic as below: Future Appointments  Date Time Provider Department Center  06/24/2023  8:30 AM Sophronia Varney, Konrad Dolores, MD [facility] None  08/15/2023  7:45 AM [facility] PHLEBOTOMIST [facility] None  08/15/2023  8:20 AM Ladene Artist, MD [facility] None

## 2023-06-25 ENCOUNTER — Ambulatory Visit: Payer: BC Managed Care – PPO | Admitting: Endocrinology

## 2023-08-15 ENCOUNTER — Telehealth: Payer: Self-pay

## 2023-08-15 ENCOUNTER — Inpatient Hospital Stay: Payer: BC Managed Care – PPO | Attending: Oncology

## 2023-08-15 ENCOUNTER — Other Ambulatory Visit: Payer: Self-pay

## 2023-08-15 ENCOUNTER — Inpatient Hospital Stay: Payer: BC Managed Care – PPO | Admitting: Oncology

## 2023-08-15 VITALS — BP 142/67 | HR 78 | Temp 98.1°F | Resp 18 | Ht 73.0 in | Wt 239.9 lb

## 2023-08-15 DIAGNOSIS — R300 Dysuria: Secondary | ICD-10-CM | POA: Diagnosis not present

## 2023-08-15 DIAGNOSIS — Z08 Encounter for follow-up examination after completed treatment for malignant neoplasm: Secondary | ICD-10-CM | POA: Diagnosis present

## 2023-08-15 DIAGNOSIS — E1142 Type 2 diabetes mellitus with diabetic polyneuropathy: Secondary | ICD-10-CM | POA: Insufficient documentation

## 2023-08-15 DIAGNOSIS — Z8546 Personal history of malignant neoplasm of prostate: Secondary | ICD-10-CM | POA: Diagnosis not present

## 2023-08-15 DIAGNOSIS — Z85038 Personal history of other malignant neoplasm of large intestine: Secondary | ICD-10-CM | POA: Diagnosis present

## 2023-08-15 DIAGNOSIS — C182 Malignant neoplasm of ascending colon: Secondary | ICD-10-CM

## 2023-08-15 DIAGNOSIS — N3001 Acute cystitis with hematuria: Secondary | ICD-10-CM

## 2023-08-15 DIAGNOSIS — D509 Iron deficiency anemia, unspecified: Secondary | ICD-10-CM | POA: Insufficient documentation

## 2023-08-15 LAB — URINALYSIS, COMPLETE (UACMP) WITH MICROSCOPIC
Bilirubin Urine: NEGATIVE
Glucose, UA: 250 mg/dL — AB
Ketones, ur: NEGATIVE mg/dL
Nitrite: NEGATIVE
Protein, ur: 100 mg/dL — AB
Specific Gravity, Urine: 1.024 (ref 1.005–1.030)
WBC, UA: >50 WBC/hpf (ref 0–5)
pH: 6 (ref 5.0–8.0)

## 2023-08-15 LAB — CEA (ACCESS): CEA (CHCC): 5.46 ng/mL — ABNORMAL HIGH (ref 0.00–5.00)

## 2023-08-15 MED ORDER — CIPROFLOXACIN HCL 500 MG PO TABS: 500.0000 mg | ORAL_TABLET | Freq: Two times a day (BID) | ORAL | 0 refills | Status: DC

## 2023-08-15 NOTE — Progress Notes (Signed)
[facility] OFFICE PROGRESS NOTE   Diagnosis: Colon cancer  INTERVAL HISTORY:   Mr Wyatt Meyer returns as scheduled.  He generally feels well.  He has intermittent urinary incontinence.  He feels he may have a urinary tract infection at present due to foul-smelling and dark urine.  No difficulty with bowel function.  No rectal bleeding.  He is working.  He continues smoking.  He did not return for a CEA while off of cigarettes earlier this year. Objective:  Vital signs in last 24 hours:  Blood pressure (!) 142/67, pulse 78, temperature 98.1 F (36.7 C), temperature source Temporal, resp. rate 18, height 6\' 1"  (1.854 m), weight 239 lb 14.4 oz (108.8 kg), SpO2 98%.     Lymphatics: No cervical, supraclavicular, axillary, or inguinal nodes Resp: Lungs clear bilaterally Cardio: Regular rate and rhythm GI: No hepatosplenomegaly, no mass, nontender Vascular: No leg edema   Lab Results:  Lab Results  Component Value Date   WBC 9.2 06/10/2023   HGB 10.2 (L) 06/10/2023   HCT 32.4 (L) 06/10/2023   MCV 96.4 06/10/2023   PLT 180 06/10/2023   NEUTROABS 4.4 05/16/2022    CMP  Lab Results  Component Value Date   NA 135 06/10/2023   K 4.0 06/10/2023   CL 103 06/10/2023   CO2 23 06/10/2023   GLUCOSE 190 (H) 06/10/2023   BUN 24 (H) 06/10/2023   CREATININE 1.39 (H) 06/10/2023   CALCIUM 8.6 (L) 06/10/2023   PROT 8.2 (H) 06/09/2023   ALBUMIN 3.4 (L) 06/09/2023   AST 20 06/09/2023   ALT 17 06/09/2023   ALKPHOS 85 06/09/2023   BILITOT 0.7 06/09/2023   GFRNONAA 57 (L) 06/10/2023   GFRAA 84 04/04/2016    Lab Results  Component Value Date   CEA 6.90 (H) 02/05/2023     Medications: I have reviewed the patient's current medications.   Assessment/Plan:  Colon cancer, cecum, stage IIb (T4a N0 M0), status post a laparoscopic right colectomy 05/03/2021 0/19 lymph nodes, no perineural or lymphovascular invasion, negative resection margins, no tumor deposits, tumor  extends into subserosal connective tissue with microscopic involvement of the serosa, MSS, no loss of mismatch repair protein expression CT the abdomen/pelvis 02/03/2021-no bowel obstruction, no adenopathy, probable fatty liver CT chest 02/14/2021-2 mm right middle lobe and 6 mm left upper lobe fissural nodule, nonspecific Elevated preoperative CEA, repeat CEA 06/05/2021-4.3 Guardant Reveal 06/05/2021-negative Cycle 1 adjuvant Xeloda 06/05/2021 Cycle 2 Xeloda 06/26/2021--reported he was still taking Xeloda 07/14/2021, instructed to stop further Xeloda with the plan to resume with cycle 3 on 07/24/2021 Cycle 3 Xeloda 07/24/2021 Cycle 4 Xeloda 08/14/2021-reported he was still taking Xeloda 08/31/2021, instructed to stop further Xeloda with the plan to resume with cycle 5 on 09/08/2021 Guardant reveal 08/31/2021-negative Cycle 5 Xeloda 09/08/2021 Xeloda placed on hold due to hand-foot syndrome 09/28/2021 Cycle 6 Xeloda resumed 10/16/2021, dose reduced to 1000 mg twice daily for 14 days/7 day break Cycle 7 Xeloda 11/20/2021 Cycle 8 Xeloda 12/11/2021 CTs 02/12/2022-no evidence of recurrent disease, stable tiny bilateral pulmonary nodules-benign Colonoscopy 06/11/2022-diverticulosis in the sigmoid colon CTs 02/05/2023-no evidence of recurrent disease, stable tiny pulmonary nodules   Iron deficiency anemia secondary to #1 Diabetes Peripheral neuropathy History of prostate cancer, status post prostatectomy 08/29/2012, pT2c, N0, MX, Gleason 7 Hand/foot syndrome-Xeloda placed on hold 09/28/2021, improved Chronic mild elevation of the CEA     Disposition: [name] is in clinical remission from colon cancer.  He has chronic mild elevation of CEA, likely  secondary to ongoing tobacco use.  We will follow-up on the CEA from today.  He will return for an office visit and surveillance CTs in 6 months.  [name] will discontinue tobacco use for 4 weeks and return for a repeat CEA 09/23/2023.  He has symptoms of a  urinary tract infection today.  He will return to the lab for urinalysis and culture. Thornton Papas, MD  08/15/2023  8:35 AM      Thornton Papas, MD  08/15/2023  8:35 AM

## 2023-08-15 NOTE — Telephone Encounter (Signed)
-----   Message from Thornton Papas sent at 08/15/2023  2:59 PM EST ----- Please call patient, urinalysis is consistent with an infection, start ciprofloxacin 500 mg twice daily for 5 days, follow-up with primary provider if symptoms do not improve

## 2023-08-17 LAB — URINE CULTURE: Culture: >=100000 — AB

## 2023-09-23 ENCOUNTER — Inpatient Hospital Stay: Payer: BC Managed Care – PPO | Attending: Oncology

## 2023-09-23 DIAGNOSIS — Z08 Encounter for follow-up examination after completed treatment for malignant neoplasm: Secondary | ICD-10-CM | POA: Insufficient documentation

## 2023-09-23 DIAGNOSIS — C182 Malignant neoplasm of ascending colon: Secondary | ICD-10-CM

## 2023-09-23 DIAGNOSIS — Z85038 Personal history of other malignant neoplasm of large intestine: Secondary | ICD-10-CM | POA: Diagnosis present

## 2023-09-23 LAB — CEA (ACCESS): CEA (CHCC): 7 ng/mL — ABNORMAL HIGH (ref 0.00–5.00)

## 2023-09-24 ENCOUNTER — Telehealth: Payer: Self-pay | Admitting: *Deleted

## 2023-09-24 NOTE — Telephone Encounter (Signed)
 Informed Wyatt Meyer that CEA remains mildly elevated and stable, likely a benign finding, follow-up as scheduled. He said he smokes a small amount. Informed him that smoking can also elevate the CEA

## 2023-09-24 NOTE — Telephone Encounter (Signed)
-----   Message from Thornton Papas sent at 09/23/2023  5:06 PM EST ----- Please call patient, CEA remains mildly elevated and stable, likely a benign finding, follow-up as scheduled, is he smoking?

## 2024-01-01 ENCOUNTER — Other Ambulatory Visit: Payer: Self-pay | Admitting: *Deleted

## 2024-01-01 MED ORDER — LANTUS SOLOSTAR 100 UNIT/ML ~~LOC~~ SOPN: 60.0000 [IU] | PEN_INJECTOR | Freq: Every day | SUBCUTANEOUS | 1 refills | Status: DC

## 2024-02-07 ENCOUNTER — Encounter: Payer: Self-pay | Admitting: Family Medicine

## 2024-02-07 ENCOUNTER — Ambulatory Visit (INDEPENDENT_AMBULATORY_CARE_PROVIDER_SITE_OTHER): Admitting: Family Medicine

## 2024-02-07 VITALS — BP 123/70 | HR 83 | Temp 97.0°F | Ht 73.0 in | Wt 234.0 lb

## 2024-02-07 DIAGNOSIS — Z8546 Personal history of malignant neoplasm of prostate: Secondary | ICD-10-CM | POA: Diagnosis not present

## 2024-02-07 DIAGNOSIS — R0609 Other forms of dyspnea: Secondary | ICD-10-CM | POA: Diagnosis not present

## 2024-02-07 DIAGNOSIS — Z794 Long term (current) use of insulin: Secondary | ICD-10-CM

## 2024-02-07 DIAGNOSIS — E785 Hyperlipidemia, unspecified: Secondary | ICD-10-CM

## 2024-02-07 DIAGNOSIS — E1165 Type 2 diabetes mellitus with hyperglycemia: Secondary | ICD-10-CM

## 2024-02-07 DIAGNOSIS — E538 Deficiency of other specified B group vitamins: Secondary | ICD-10-CM | POA: Diagnosis not present

## 2024-02-07 LAB — LIPID PANEL
Cholesterol: 289 mg/dL — ABNORMAL HIGH (ref 0–200)
HDL: 41 mg/dL (ref >39.00)
LDL Cholesterol: 200 mg/dL — ABNORMAL HIGH (ref 0–99)
NonHDL: 247.85
Total CHOL/HDL Ratio: 7
Triglycerides: 239 mg/dL — ABNORMAL HIGH (ref 0.0–149.0)
VLDL: 47.8 mg/dL — ABNORMAL HIGH (ref 0.0–40.0)

## 2024-02-07 LAB — COMPREHENSIVE METABOLIC PANEL WITH GFR
ALT: 15 U/L (ref 0–53)
AST: 18 U/L (ref 0–37)
Albumin: 3.9 g/dL (ref 3.5–5.2)
Alkaline Phosphatase: 102 U/L (ref 39–117)
BUN: 26 mg/dL — ABNORMAL HIGH (ref 6–23)
CO2: 29 meq/L (ref 19–32)
Calcium: 9.4 mg/dL (ref 8.4–10.5)
Chloride: 103 meq/L (ref 96–112)
Creatinine, Ser: 1.79 mg/dL — ABNORMAL HIGH (ref 0.40–1.50)
GFR: 39.49 mL/min — ABNORMAL LOW (ref >60.00)
Glucose, Bld: 129 mg/dL — ABNORMAL HIGH (ref 70–99)
Potassium: 4.5 meq/L (ref 3.5–5.1)
Sodium: 138 meq/L (ref 135–145)
Total Bilirubin: 0.4 mg/dL (ref 0.2–1.2)
Total Protein: 7.1 g/dL (ref 6.0–8.3)

## 2024-02-07 LAB — HEMOGLOBIN A1C: Hgb A1c MFr Bld: 12.7 % — ABNORMAL HIGH (ref 4.6–6.5)

## 2024-02-07 LAB — CBC
HCT: 35.7 % — ABNORMAL LOW (ref 39.0–52.0)
Hemoglobin: 11.7 g/dL — ABNORMAL LOW (ref 13.0–17.0)
MCHC: 32.8 g/dL (ref 30.0–36.0)
MCV: 93.3 fl (ref 78.0–100.0)
Platelets: 239 10*3/uL (ref 150.0–400.0)
RBC: 3.83 Mil/uL — ABNORMAL LOW (ref 4.22–5.81)
RDW: 13.6 % (ref 11.5–15.5)
WBC: 6.6 10*3/uL (ref 4.0–10.5)

## 2024-02-07 MED ORDER — INSULIN LISPRO (1 UNIT DIAL) 100 UNIT/ML (KWIKPEN): 2.0000 [IU] | PEN_INJECTOR | Freq: Three times a day (TID) | SUBCUTANEOUS | 3 refills | Status: AC

## 2024-02-07 NOTE — Assessment & Plan Note (Signed)
 Check A1c.  I had lengthy discussion with patient today regarding A1c control and importance of maintaining good glycemic control to prevent complications from diabetes.  Patient does admit that he has had difficulty with staying consistent with his treatment plan with insulin  and checking his sugar regularly.  He is currently on Lantus  60 units daily and Humalog  as needed though he has not been consistent with this regimen.  We will check labs today.  Will also refer to endocrinology though will likely start GLP-1 agonist depending on results of today's A1c.

## 2024-02-07 NOTE — Progress Notes (Signed)
 Wyatt Meyer is a 63 y.o. male who presents today for an office visit.  Assessment/Plan:  New/Acute Problems: Right Hip Pain Exam consistent with trochanteric bursitis.  We discussed home exercise program and handout was given.  Can also use ice to the area as well.  He had is okay for him to use Tylenol .  Will avoid NSAIDs for now given his concern for cardiac issues as below.  Also has GFR 57 on most recent labs.  Will check labs today.  Will also need to avoid prednisone due to his history of uncontrolled diabetes.  We did discuss referral to physical therapy however he like to hold off on that for now.  He will follow-up with us  in a couple weeks.  Not improving would consider referral to sports medicine or PT at that time.  Dyspnea on Exertion  Not currently having any symptoms.  EKG in the office shows NSR without any ischemic changes.  Concern that his dyspnea on exertion may represent anginal equivalent especially in light of his uncontrolled diabetes.  We will check labs today though he needs cardiac evaluation.  Will place urgent referral to cardiology today.  We discussed reasons to return to care and seek emergent care.  Chronic Problems Addressed Today: Hyperlipidemia He has been off all medications for over the last year since his last visit here.  Not currently on statin.  Will check labs today though will likely need to restart statin.   History of prostate cancer Has not had PSA checked in a few years.  Last saw urology a few years ago.  Will check PSA today.  Type 2 diabetes mellitus with hyperglycemia (HCC) Check A1c.  I had lengthy discussion with patient today regarding A1c control and importance of maintaining good glycemic control to prevent complications from diabetes.  Patient does admit that he has had difficulty with staying consistent with his treatment plan with insulin  and checking his sugar regularly.  He is currently on Lantus  60 units daily and Humalog  as  needed though he has not been consistent with this regimen.  We will check labs today.  Will also refer to endocrinology though will likely start GLP-1 agonist depending on results of today's A1c.  Vitamin B12 deficiency Check B12.     Subjective:  HPI:  See Assessment / plan for status of chronic conditions. Patient here with right hip start his EKG at notes for some mild pain. Pain comes and goes. Intermittent in nature. Started a couple of weeks ago.  No obvious injuries or precipitating events. Worse with getting up. Pain does not go down his leg. Tried using topical biofreeze and advil  with some improvement.  Pain has been stable the last few weeks.  No other obvious aggravating or alleviating factors.  No numbness or tingling.  Patient was last seen here about a year ago.  At that time A1c was 11.7.  Patient does admit that he has not been consistent with checking his sugar or taking his insulin .  He is additionally has some issues with getting his Kenalog at the pharmacy and has had to stretch this out as well.  He has been consistent with his Lantus .  Patient is interested in seeing an endocrinologist to help with diabetes management though may be interested in trying a new medication such as Ozempic or Mounjaro .  Patient also reports that he has had ongoing shortness of breath with exertion for the last couple of months.  Seems to be progressive  over that time.  He has recently noticed that he becomes more winded or short of breath when doing even mild to modest exertion such as short inclines or doing yard work such as Engineer, water.  His shortness of breath quickly resolves upon resting.  He has not had any chest pain.  Has had occasional lightheadedness.         Objective:  Physical Exam: BP 123/70   Pulse 83   Temp (!) 97 F (36.1 C) (Temporal)   Ht 6\' 1"  (1.854 m)   Wt 234 lb (106.1 kg)   SpO2 100%   BMI 30.87 kg/m   Gen: No acute distress, resting comfortably CV: Regular rate  and rhythm with no murmurs appreciated Pulm: Normal work of breathing, clear to auscultation bilaterally with no crackles, wheezes, or rhonchi MUSCULOSKELETAL: - Right Hip: No deformities.  Tenderness palpation along the right greater trochanter.  Neurovascularly intact distally.  Pain elicited in right hip with internal and external rotation. Neuro: Grossly normal, moves all extremities Psych: Normal affect and thought content  EKG: NSR. No acute ischemic changes  Time Spent: 50 minutes of total time was spent on the date of the encounter performing the following actions: chart review prior to seeing the patient, obtaining history, performing a medically necessary exam, counseling on the treatment plan, placing orders, and documenting in our EHR.  This does not include time spent reviewing above EKG.       Jinny Mounts. Daneil Dunker, MD 02/07/2024 9:17 AM

## 2024-02-07 NOTE — Assessment & Plan Note (Signed)
 He has been off all medications for over the last year since his last visit here.  Not currently on statin.  Will check labs today though will likely need to restart statin.

## 2024-02-07 NOTE — Assessment & Plan Note (Signed)
 Check B12

## 2024-02-07 NOTE — Assessment & Plan Note (Signed)
 Has not had PSA checked in a few years.  Last saw urology a few years ago.  Will check PSA today.

## 2024-02-07 NOTE — Patient Instructions (Signed)
 It was very nice to see you today!  You may have bursitis in your hip.  Work on the exercises.  Use ice to the area.  You can use Tylenol  as needed.  Let us  know if not improving.  It is very told that we work on getting her sugar lower.  We will check labs today.  I will refill your insulin .  We may need to start a once weekly injection such as Ozempic or Mounjaro  depending on today's results.  I will refer you to see cardiology for further testing on your heart.  Return for Annual Physical.   Take care, Dr Daneil Dunker  PLEASE NOTE:  If you had any lab tests, please let us  know if you have not heard back within a few days. You may see your results on mychart before we have a chance to review them but we will give you a call once they are reviewed by us .   If we ordered any referrals today, please let us  know if you have not heard from their office within the next week.   If you had any urgent prescriptions sent in today, please check with the pharmacy within an hour of our visit to make sure the prescription was transmitted appropriately.   Please try these tips to maintain a healthy lifestyle:  Eat at least 3 REAL meals and 1-2 snacks per day.  Aim for no more than 5 hours between eating.  If you eat breakfast, please do so within one hour of getting up.   Each meal should contain half fruits/vegetables, one quarter protein, and one quarter carbs (no bigger than a computer mouse)  Cut down on sweet beverages. This includes juice, soda, and sweet tea.   Drink at least 1 glass of water with each meal and aim for at least 8 glasses per day  Exercise at least 150 minutes every week.

## 2024-02-11 ENCOUNTER — Ambulatory Visit: Payer: Self-pay | Admitting: Family Medicine

## 2024-02-11 LAB — VITAMIN B12: Vitamin B-12: 247 pg/mL (ref 211–911)

## 2024-02-11 LAB — PSA: PSA: 5.73 ng/mL — ABNORMAL HIGH (ref 0.10–4.00)

## 2024-02-11 LAB — TSH: TSH: 1.05 u[IU]/mL (ref 0.35–5.50)

## 2024-02-11 NOTE — Progress Notes (Signed)
 His PSA is elevated.  Recommend he follow-up with urology ASAP.  Please place referral if needed.  His A1c is not controlled at 12.7.  We need to work on getting this lower.  Recommend that we start Mounjaro  2.5 mg weekly.  Please send in this prescription if he is agreeable.  He should follow-up with us  in a few weeks to let us  know how this is working though hopefully he will be able to get into see endocrinology soon.  Cholesterol is very high.  We need to work on lowering this also to improve his numbers and low risk of heart attack and stroke.  Please send in Lipitor 40 mg daily if he is agreeable to start.  We should recheck this in 3 to 6 months.  His kidney numbers are elevated.  This is probably due to his elevated blood sugar.  We should recheck this again in a few months though hopefully will improve if we improve his diabetes.  Please place future order for CMET.  He is slightly anemic but this is also near his baseline.  We can recheck again with next blood draw.

## 2024-02-12 ENCOUNTER — Ambulatory Visit: Payer: Self-pay | Admitting: Oncology

## 2024-02-12 ENCOUNTER — Telehealth: Payer: Self-pay | Admitting: *Deleted

## 2024-02-12 ENCOUNTER — Ambulatory Visit (HOSPITAL_BASED_OUTPATIENT_CLINIC_OR_DEPARTMENT_OTHER)
Admission: RE | Admit: 2024-02-12 | Discharge: 2024-02-12 | Disposition: A | Discharge status: Home / Self Care | Payer: Self-pay | Source: Ambulatory Visit | Attending: Oncology | Admitting: Oncology

## 2024-02-12 ENCOUNTER — Inpatient Hospital Stay: Payer: Self-pay | Attending: Oncology

## 2024-02-12 DIAGNOSIS — R82998 Other abnormal findings in urine: Secondary | ICD-10-CM | POA: Insufficient documentation

## 2024-02-12 DIAGNOSIS — R8290 Unspecified abnormal findings in urine: Secondary | ICD-10-CM | POA: Insufficient documentation

## 2024-02-12 DIAGNOSIS — C182 Malignant neoplasm of ascending colon: Secondary | ICD-10-CM | POA: Insufficient documentation

## 2024-02-12 DIAGNOSIS — E119 Type 2 diabetes mellitus without complications: Secondary | ICD-10-CM | POA: Insufficient documentation

## 2024-02-12 DIAGNOSIS — R32 Unspecified urinary incontinence: Secondary | ICD-10-CM | POA: Insufficient documentation

## 2024-02-12 DIAGNOSIS — Z8546 Personal history of malignant neoplasm of prostate: Secondary | ICD-10-CM | POA: Insufficient documentation

## 2024-02-12 DIAGNOSIS — F17210 Nicotine dependence, cigarettes, uncomplicated: Secondary | ICD-10-CM | POA: Insufficient documentation

## 2024-02-12 LAB — BASIC METABOLIC PANEL - CANCER CENTER ONLY
Anion gap: 13 (ref 5–15)
BUN: 25 mg/dL — ABNORMAL HIGH (ref 8–23)
CO2: 23 mmol/L (ref 22–32)
Calcium: 9.8 mg/dL (ref 8.9–10.3)
Chloride: 99 mmol/L (ref 98–111)
Creatinine: 2.03 mg/dL — ABNORMAL HIGH (ref 0.61–1.24)
GFR, Estimated: 36 mL/min — ABNORMAL LOW (ref >60)
Glucose, Bld: 515 mg/dL (ref 70–99)
Potassium: 5 mmol/L (ref 3.5–5.1)
Sodium: 135 mmol/L (ref 135–145)

## 2024-02-12 LAB — CEA (ACCESS): CEA (CHCC): 6.65 ng/mL — ABNORMAL HIGH (ref 0.00–5.00)

## 2024-02-12 MED ORDER — IOHEXOL 300 MG/ML  SOLN
100.0000 mL | Freq: Once | INTRAMUSCULAR | Status: AC | PRN
  Administered 2024-02-12: 80 mL via INTRAVENOUS

## 2024-02-12 NOTE — Telephone Encounter (Signed)
 CRITICAL VALUE STICKER  CRITICAL VALUE: glucose 515  RECEIVER (on-site recipient of call):Cheris Tweten,RN  DATE & TIME NOTIFIED: 02/12/24 @ 1242  MESSENGER (representative from lab):Mylinda Asa  MD NOTIFIED: Dr. Scherrie Curt  TIME OF NOTIFICATION:1243  RESPONSE: Reach out to PCP and patient. Unable to reach patient and spoke w/Mrs. Kreischer and she will contact him to call PCP and take his sliding scale insulin  and push water po. This RN faxed Bmet results to Dr. Daneil Dunker and called office and spoke with Adreana with result. She will make provider aware. Creatinine is elevated today as well. Called radiology and reported result and was told that radiologist will give reduced dose of IV contrast.

## 2024-02-12 NOTE — Telephone Encounter (Signed)
 Copied from CRM [id number]. Topic: Clinical - Red Word Triage >> Feb 12, 2024  1:06 PM Jenice Mitts wrote: Red Word that prompted transfer to Nurse Triage: High blood sugar   Nurse from Dr Coni Deep office is calling because they have received lab work back for the patient and his glucose is 515 they were not able to get in contact with the patient so she called to let us  know

## 2024-02-12 NOTE — Telephone Encounter (Signed)
 Spoke with patient, stated he took insulin  after reading. Patient asymptomatic  Stated will recheck glucose when he goes home, at work now  Advise to send a message with glucose results on my chart, if still elevated proceed to go to ED  Patient verbalized understanding

## 2024-02-13 NOTE — Telephone Encounter (Signed)
 Wyatt Meyer informed of his CT being negative for cancer. He reports he did f/u with Dr. Daneil Dunker and his glucose is 140 today.

## 2024-02-13 NOTE — Telephone Encounter (Signed)
 Noted. Agree with plan.  I would like for him to follow-up with us  in a week or 2 for insulin  adjustment though he should let us  know if he has any extreme elevations of his glucose at home.

## 2024-02-13 NOTE — Telephone Encounter (Signed)
 Patient has an OV on 02/18/2024

## 2024-02-18 ENCOUNTER — Other Ambulatory Visit: Payer: Self-pay | Admitting: *Deleted

## 2024-02-18 ENCOUNTER — Inpatient Hospital Stay: Payer: BC Managed Care – PPO | Admitting: Oncology

## 2024-02-18 ENCOUNTER — Telehealth: Payer: Self-pay | Admitting: Nurse Practitioner

## 2024-02-18 DIAGNOSIS — D593 Hemolytic-uremic syndrome, unspecified: Secondary | ICD-10-CM

## 2024-02-18 DIAGNOSIS — R972 Elevated prostate specific antigen [PSA]: Secondary | ICD-10-CM

## 2024-02-18 MED ORDER — TIRZEPATIDE 2.5 MG/0.5ML ~~LOC~~ SOAJ: 2.5000 mg | SUBCUTANEOUS | 0 refills | Status: DC

## 2024-02-18 MED ORDER — ATORVASTATIN CALCIUM 40 MG PO TABS: 40.0000 mg | ORAL_TABLET | Freq: Every day | ORAL | 3 refills | Status: AC

## 2024-02-18 NOTE — Telephone Encounter (Signed)
 Re-scheduled PT appt., voicemail not set up.

## 2024-02-21 ENCOUNTER — Other Ambulatory Visit (HOSPITAL_COMMUNITY): Payer: Self-pay

## 2024-02-21 ENCOUNTER — Ambulatory Visit: Attending: Cardiology | Admitting: Cardiology

## 2024-02-21 ENCOUNTER — Telehealth: Payer: Self-pay

## 2024-02-21 ENCOUNTER — Encounter: Payer: Self-pay | Admitting: Cardiology

## 2024-02-21 VITALS — BP 139/70 | HR 86 | Ht 73.0 in | Wt 239.8 lb

## 2024-02-21 DIAGNOSIS — E785 Hyperlipidemia, unspecified: Secondary | ICD-10-CM

## 2024-02-21 DIAGNOSIS — R0602 Shortness of breath: Secondary | ICD-10-CM | POA: Diagnosis not present

## 2024-02-21 DIAGNOSIS — R600 Localized edema: Secondary | ICD-10-CM

## 2024-02-21 MED ORDER — METOPROLOL TARTRATE 100 MG PO TABS: 100.0000 mg | ORAL_TABLET | Freq: Once | ORAL | 0 refills | Status: AC

## 2024-02-21 MED ORDER — VARENICLINE TARTRATE 0.5 MG PO TABS
0.5000 mg | ORAL_TABLET | Freq: Two times a day (BID) | ORAL | 3 refills | Status: AC
Start: 2024-02-21 — End: ?

## 2024-02-21 NOTE — Telephone Encounter (Signed)
 Pharmacy Patient Advocate Encounter   Received notification from CoverMyMeds that prior authorization for Mounjaro  2.5 is required/requested.   Insurance verification completed.   The patient is insured through [facility] AD[employer]/RX ADVANCE .   Per test claim: PA required; PA submitted to above mentioned insurance via CoverMyMeds Key/confirmation #/EOC [id number] Status is pending

## 2024-02-21 NOTE — Telephone Encounter (Signed)
 Pharmacy Patient Advocate Encounter  Received notification from [employer] that Prior Authorization for Mounjaro  2.5 has been APPROVED from 02/21/24 to 02/21/27. Ran test claim, Copay is $25.00. This test claim was processed through [facility]- copay amounts may vary at other pharmacies due to pharmacy/plan contracts, or as the patient moves through the different stages of their insurance plan.   PA #/Case ID/Reference #: [id number]

## 2024-02-21 NOTE — Patient Instructions (Addendum)
 Medication Instructions:  Start Chantix 0.5 mg twice daily *If you need a refill on your cardiac medications before your next appointment, please call your pharmacy*  Lab Work: Lpa and BNP today If you have labs (blood work) drawn today and your tests are completely normal, you will receive your results only by: MyChart Message (if you have MyChart) OR A paper copy in the mail If you have any lab test that is abnormal or we need to change your treatment, we will call you to review the results.  Testing/Procedures: Coronary CTA  Follow-Up: At [facility], you and your health needs are our priority.  As part of our continuing mission to provide you with exceptional heart care, our providers are all part of one team.  This team includes your primary Cardiologist (physician) and Advanced Practice Providers or APPs (Physician Assistants and Nurse Practitioners) who all work together to provide you with the care you need, when you need it.  Your next appointment:   After Coronary CTA  Provider:   Lavonne Prairie or Chad  We recommend signing up for the patient portal called "MyChart".  Sign up information is provided on this After Visit Summary.  MyChart is used to connect with patients for Virtual Visits (Telemedicine).  Patients are able to view lab/test results, encounter notes, upcoming appointments, etc.  Non-urgent messages can be sent to your provider as well.   To learn more about what you can do with MyChart, go to [email or web address].   Other Instructions   Your cardiac CT will be scheduled at one of the below locations:   [facility] [address] [place], [place] [address] [phone]   If scheduled at [facility], please arrive at the [facility] (Entrance C2) of [facility] 30 minutes prior to test start time. You can use the FREE valet parking offered at entrance C (encouraged to control the heart rate for the  test)  Proceed to the [facility] Radiology Department (first floor) to check-in and test prep.   All radiology patients and guests should use [facility] C2 at [facility], [facility] from [facility], even though the hospital's physical address listed is [address]    Please follow these instructions carefully (unless otherwise directed):  An IV will be required for this test and Nitroglycerin will be given.  Hold all erectile dysfunction medications at least 3 days (72 hrs) prior to test. (Ie viagra, cialis, sildenafil, tadalafil, etc)   On the Night Before the Test: Be sure to Drink plenty of water. Do not consume any caffeinated/decaffeinated beverages or chocolate 12 hours prior to your test. Do not take any antihistamines 12 hours prior to your test.  On the Day of the Test: Drink plenty of water until 1 hour prior to the test. Do not eat any food 1 hour prior to test. You may take your regular medications prior to the test.  Take metoprolol (Lopressor) 100 mg two hours prior to test. If you take Furosemide/Hydrochlorothiazide/Spironolactone/Chlorthalidone, please HOLD on the morning of the test. Patients who wear a continuous glucose monitor MUST remove the device prior to scanning. FEMALES- please wear underwire-free bra if available, avoid dresses & tight clothing      After the Test: Drink plenty of water. After receiving IV contrast, you may experience a mild flushed feeling. This is normal. On occasion, you may experience a mild rash up to 24 hours after the test. This is  not dangerous. If this occurs, you can take Benadryl  25 mg, Zyrtec, Claritin, or Allegra and increase your fluid intake. (Patients taking Tikosyn should avoid Benadryl , and may take Zyrtec, Claritin, or Allegra) If you experience trouble breathing, this can be serious. If it is severe call 911 IMMEDIATELY. If it is mild, please call our office.  We will call to schedule your  test 2-4 weeks out understanding that some insurance companies will need an authorization prior to the service being performed.   For more information and frequently asked questions, please visit our website : [email or web address]  For non-scheduling related questions, please contact the cardiac imaging nurse navigator should you have any questions/concerns: Cardiac Imaging Nurse Navigators Direct Office Dial : [phone]   For scheduling needs, including cancellations and rescheduling, please call [place], [phone].

## 2024-02-21 NOTE — Progress Notes (Signed)
 Cardiology Office Note   Date:  02/21/2024   ID:  Wyatt Meyer, DOB 24-Jan-1961, MRN [id number]  PCP:  Rodney Clamp, MD  Cardiologist:   None Referring:  Rodney Clamp, MD  Chief Complaint  Patient presents with   Shortness of Breath      History of Present Illness: Wyatt Meyer is a 63 y.o. male who presents for evaluation of SOB.  He was referred today by Rodney Clamp, MD .   He has no past cardiac history.  He has significant cardiovascular risk factors.  He had longstanding diabetes and his A1c is currently 12.7.  He has dyslipidemia with an LDL of 200.  He does have coronary calcium  I see incidentally on a CT that he has had previously.  He has not had any prior cardiac workup however.  He does work in Nurse, learning disability at [employer].  He is getting increasing shortness of breath climbing the stairs.  He has to stop at the top of stairs.  This has been progressive.  He is not having resting shortness of breath, PND or orthopnea.  He is not having palpitations, presyncope or syncope.  He denies any chest pressure, neck or arm discomfort.  He had a little lower extremity edema.   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/23/2016   Hyperlipidemia    on meds   Iron  deficiency anemia    on IRON  and Geritol   Kidney stone    Klebsiella sepsis (HCC) 03/25/2015   Osteoarthritis 08/11/2012   Peripheral neuropathy 08/11/2012   Prostate cancer (HCC)    Smoker    SUI (stress urinary incontinence), male 02/03/2013   Uncontrolled diabetes mellitus with complications 03/23/2015   on meds    Past Surgical History:  Procedure Laterality Date   COLONOSCOPY  01/27/2021   Dr.Beavers   COLONOSCOPY WITH PROPOFOL  N/A 06/11/2022   Procedure: COLONOSCOPY WITH PROPOFOL ;  Surgeon: Lindle Rhea, MD;  Location: [facility] ENDOSCOPY;  Service: Gastroenterology;  Laterality: N/A;   LAPAROSCOPIC RIGHT  HEMI COLECTOMY Right 05/03/2021   Procedure: LAPAROSCOPIC RIGHT HEMI COLECTOMY WITH LYSIS OF ADHESIONS AND BILATERAL TAP BLOCK;  Surgeon: Melvenia Stabs, MD;  Location: [facility] ORS;  Service: General;  Laterality: Right;   PROSTATE SURGERY  09/25/2011   PROSTATE SURGERY     WISDOM TOOTH EXTRACTION       Current Outpatient Medications  Medication Sig Dispense Refill   atorvastatin  (LIPITOR) 40 MG tablet Take 1 tablet (40 mg total) by mouth daily. 90 tablet 3   Continuous Glucose Sensor (FREESTYLE LIBRE 2 SENSOR) MISC USE AS DIRECTED TO CHECK BS 6 each 5   insulin  glargine (LANTUS  SOLOSTAR) 100 UNIT/ML Solostar Pen Inject 60 Units into the skin daily. 15 mL 1   insulin  lispro (HUMALOG ) 100 UNIT/ML KwikPen Inject 2-15 Units into the skin 4 (four) times daily -  before meals and at bedtime. CBG 121 - 150: 2 units, CBG 151 - 200: 3 units, CBG 201 - 250: 5 units, CBG 251 - 300:  8 units, CBG 301 - 350: 11 units, CBG 351 - 400: 15 units, CBG > 400 call MD 15 mL 3   Insulin  Pen Needle (BD PEN NEEDLE NANO U/F) 32G X 4 MM MISC use four times a day 400 each 3   metoprolol tartrate (LOPRESSOR) 100 MG tablet Take 1 tablet (100 mg total) by  mouth once for 1 dose. Take 90-120 minutes prior to scan. Hold for SBP less than 110. 1 tablet 0   ONE TOUCH LANCETS MISC Use 3x a day 200 each 11   tirzepatide  (MOUNJARO ) 2.5 MG/0.5ML Pen Inject 2.5 mg into the skin once a week. 2 mL 0   varenicline (CHANTIX) 0.5 MG tablet Take 1 tablet (0.5 mg total) by mouth 2 (two) times daily. 180 tablet 3   No current facility-administered medications for this visit.    Allergies:   Codeine    Social History:  The patient  reports that he has been smoking cigarettes. He has never used smokeless tobacco. He reports current alcohol use. He reports that he does not use drugs.   Family History:  The patient's family history includes Diabetes in his mother; Heart disease in his father and mother; Hyperlipidemia in his mother and  sister; Stroke in his mother; Thyroid cancer in his sister and sister.    ROS:  Please see the history of present illness.   Otherwise, review of systems are positive for none.   All other systems are reviewed and negative.    PHYSICAL EXAM: VS:  BP 139/70 (BP Location: Left Arm, Patient Position: Sitting, Cuff Size: Large)   Pulse 86   Ht 6\' 1"  (1.854 m)   Wt 239 lb 12.8 oz (108.8 kg)   SpO2 99%   BMI 31.64 kg/m  , BMI Body mass index is 31.64 kg/m. GENERAL:  Well appearing HEENT:  Pupils equal round and reactive, fundi not visualized, oral mucosa unremarkable NECK:  No jugular venous distention, waveform within normal limits, carotid upstroke brisk and symmetric, no bruits, no thyromegaly LYMPHATICS:  No cervical, inguinal adenopathy LUNGS:  Clear to auscultation bilaterally BACK:  No CVA tenderness CHEST:  Unremarkable HEART:  PMI not displaced or sustained,S1 and S2 within normal limits, no S3, no S4, no clicks, no rubs, no murmurs ABD:  Flat, positive bowel sounds normal in frequency in pitch, no bruits, no rebound, no guarding, no midline pulsatile mass, no hepatomegaly, no splenomegaly EXT:  2 plus pulses throughout, no edema, no cyanosis no clubbing SKIN:  No rashes no nodules NEURO:  Cranial nerves II through XII grossly intact, motor grossly intact throughout PSYCH:  Cognitively intact, oriented to person place and time    EKG: Sinus rhythm, rate 81, axis within normal limits, intervals within normal limits, no acute ST-T wave changes.    Recent Labs: 02/07/2024: ALT 15; Hemoglobin 11.7; Platelets 239.0; TSH 1.05 02/12/2024: BUN 25; Creatinine 2.03; Potassium 5.0; Sodium 135    Lipid Panel    Component Value Date/Time   CHOL 289 (H) 02/07/2024 0924   TRIG 239.0 (H) 02/07/2024 0924   HDL 41.00 02/07/2024 0924   CHOLHDL 7 02/07/2024 0924   VLDL 47.8 (H) 02/07/2024 0924   LDLCALC 200 (H) 02/07/2024 0924   LDLCALC 170 (H) 04/19/2020 1228   LDLDIRECT 166.0  01/04/2017 0806      Wt Readings from Last 3 Encounters:  02/21/24 239 lb 12.8 oz (108.8 kg)  02/07/24 234 lb (106.1 kg)  08/15/23 239 lb 14.4 oz (108.8 kg)      Other studies Reviewed: Additional studies/ records that were reviewed today include: Labs. Review of the above records demonstrates:  Please see elsewhere in the note.     ASSESSMENT AND PLAN:  SOB: This shortness of breath certainly could be an unstable angina equivalent.  It is new onset and progressive.  He has significant cardiovascular  risk factors and known coronary calcium .  The pretest probability of obstructive coronary disease is moderately high to high.  Emina start with a coronary CTA.  Further evaluation and management will based on this.  I will check a BNP.  If he has no obstructive coronary disease I would follow-up with an echocardiogram.  Elevated coronary calcium : At the least he has some nonobstructive plaque but I will evaluate as above.  He needs aggressive risk reduction.  Diabetes mellitus: He is about to start to see an endocrinologist and I agree with this.  Management per his PCP and endocrinology.  We talked at length about diet.  He already has good physical activity.  Dyslipidemia: I will check an LP(a).  He has not started taking Lipitor.  I think is reasonable to start with 40 mg though I suspect he will need more.  I think his goal LDL will be at least 70 but will be based on the results of the above testing.   Current medicines are reviewed at length with the patient today.  The patient does not have concerns regarding medicines.  The following changes have been made:  no change  Labs/ tests ordered today include:   Orders Placed This Encounter  Procedures   CT CORONARY MORPH W/CTA COR W/SCORE W/CA W/CM &/OR WO/CM   Lipoprotein A (LPA)   Pro b natriuretic peptide (BNP)     Disposition:   FU in 2 months or sooner based on the results of the above.     Signed, Eilleen Grates, MD   02/21/2024 12:13 PM    [facility]

## 2024-02-21 NOTE — Telephone Encounter (Signed)
 I called pt in regards to approval, No questions at this time.

## 2024-02-22 LAB — PRO B NATRIURETIC PEPTIDE: NT-Pro BNP: 223 pg/mL — ABNORMAL HIGH (ref 0–210)

## 2024-02-22 LAB — LIPOPROTEIN A (LPA): Lipoprotein (a): 231.8 nmol/L — ABNORMAL HIGH (ref <75.0)

## 2024-02-23 ENCOUNTER — Ambulatory Visit: Payer: Self-pay | Admitting: Cardiology

## 2024-02-24 NOTE — Telephone Encounter (Signed)
 Pt returning call

## 2024-02-27 ENCOUNTER — Other Ambulatory Visit

## 2024-03-04 ENCOUNTER — Telehealth (HOSPITAL_COMMUNITY): Payer: Self-pay | Admitting: Emergency Medicine

## 2024-03-04 NOTE — Telephone Encounter (Signed)
 Reaching out to patient to offer assistance regarding upcoming cardiac imaging study; pt verbalizes understanding of appt date/time, parking situation and where to check in, pre-test NPO status and medications ordered, and verified current allergies; name and call back number provided for further questions should they arise Rockwell Alexandria RN Navigator Cardiac Imaging [facility] [phone] office [phone] cell

## 2024-03-05 ENCOUNTER — Ambulatory Visit (HOSPITAL_COMMUNITY)
Admission: RE | Admit: 2024-03-05 | Discharge: 2024-03-05 | Disposition: A | Discharge status: Home / Self Care | Source: Ambulatory Visit | Attending: Cardiology | Admitting: Cardiology

## 2024-03-05 DIAGNOSIS — R0602 Shortness of breath: Secondary | ICD-10-CM | POA: Diagnosis present

## 2024-03-05 DIAGNOSIS — I2510 Atherosclerotic heart disease of native coronary artery without angina pectoris: Secondary | ICD-10-CM

## 2024-03-05 MED ORDER — DILTIAZEM HCL 25 MG/5ML IV SOLN
10.0000 mg | INTRAVENOUS | Status: DC | PRN
Start: 2024-03-05 — End: 2024-03-06

## 2024-03-05 MED ORDER — IOHEXOL 350 MG/ML SOLN
100.0000 mL | Freq: Once | INTRAVENOUS | Status: AC | PRN
  Administered 2024-03-05: 100 mL via INTRAVENOUS

## 2024-03-05 MED ORDER — NITROGLYCERIN 0.4 MG SL SUBL
0.8000 mg | SUBLINGUAL_TABLET | Freq: Once | SUBLINGUAL | Status: AC
  Administered 2024-03-05: 0.8 mg via SUBLINGUAL

## 2024-03-05 MED ORDER — METOPROLOL TARTRATE 5 MG/5ML IV SOLN: 10.0000 mg | Freq: Once | INTRAVENOUS | Status: DC | PRN

## 2024-03-10 ENCOUNTER — Encounter: Payer: Self-pay | Admitting: Nurse Practitioner

## 2024-03-10 ENCOUNTER — Inpatient Hospital Stay: Attending: Oncology | Admitting: Nurse Practitioner

## 2024-03-10 VITALS — BP 149/76 | HR 77 | Temp 98.1°F | Resp 18 | Ht 73.0 in | Wt 236.1 lb

## 2024-03-10 DIAGNOSIS — Z85038 Personal history of other malignant neoplasm of large intestine: Secondary | ICD-10-CM | POA: Diagnosis not present

## 2024-03-10 DIAGNOSIS — C182 Malignant neoplasm of ascending colon: Secondary | ICD-10-CM | POA: Diagnosis present

## 2024-03-10 DIAGNOSIS — D508 Other iron deficiency anemias: Secondary | ICD-10-CM | POA: Insufficient documentation

## 2024-03-10 DIAGNOSIS — Z9221 Personal history of antineoplastic chemotherapy: Secondary | ICD-10-CM | POA: Insufficient documentation

## 2024-03-10 NOTE — Progress Notes (Signed)
  Wyatt Meyer OFFICE PROGRESS NOTE   Diagnosis: Colon cancer  INTERVAL HISTORY:   Wyatt Meyer returns for follow-up.  No change in bowel habits.  He denies bleeding.  He is up-to-date on colonoscopy surveillance.  He continues to smoke, plans on beginning Chantix  in the near future.  Main complaint is poor energy.  Objective:  Vital signs in last 24 hours:  Blood pressure (!) 149/76, pulse 77, temperature 98.1 F (36.7 C), temperature source Temporal, resp. rate 18, height 6' 1 (1.854 m), weight 236 lb 1.6 oz (107.1 kg), SpO2 100%.    Lymphatics: No palpable cervical, supraclavicular, axillary or inguinal lymph nodes. Resp: Lungs clear bilaterally. Cardio: Regular rate and rhythm. GI: No hepatosplenomegaly.  No mass. Vascular: No leg edema.   Lab Results:  Lab Results  Component Value Date   WBC 6.6 02/07/2024   HGB 11.7 (L) 02/07/2024   HCT 35.7 (L) 02/07/2024   MCV 93.3 02/07/2024   PLT 239.0 02/07/2024   NEUTROABS 4.4 05/16/2022    Imaging:  No results found.  Medications: I have reviewed the patient's current medications.  Assessment/Plan: Colon cancer, cecum, stage IIb (T4a N0 M0), status post a laparoscopic right colectomy 05/03/2021 0/19 lymph nodes, no perineural or lymphovascular invasion, negative resection margins, no tumor deposits, tumor extends into subserosal connective tissue with microscopic involvement of the serosa, MSS, no loss of mismatch repair protein expression CT the abdomen/pelvis 02/03/2021-no bowel obstruction, no adenopathy, probable fatty liver CT chest 02/14/2021-2 mm right middle lobe and 6 mm left upper lobe fissural nodule, nonspecific Elevated preoperative CEA, repeat CEA 06/05/2021-4.3 Guardant Reveal 06/05/2021-negative Cycle 1 adjuvant Xeloda  06/05/2021 Cycle 2 Xeloda  06/26/2021--reported he was still taking Xeloda  07/14/2021, instructed to stop further Xeloda  with the plan to resume with cycle 3 on 07/24/2021 Cycle 3  Xeloda  07/24/2021 Cycle 4 Xeloda  08/14/2021-reported he was still taking Xeloda  08/31/2021, instructed to stop further Xeloda  with the plan to resume with cycle 5 on 09/08/2021 Guardant reveal 08/31/2021-negative Cycle 5 Xeloda  09/08/2021 Xeloda  placed on hold due to hand-foot syndrome 09/28/2021 Cycle 6 Xeloda  resumed 10/16/2021, dose reduced to 1000 mg twice daily for 14 days/7 day break Cycle 7 Xeloda  11/20/2021 Cycle 8 Xeloda  12/11/2021 CTs 02/12/2022-no evidence of recurrent disease, stable tiny bilateral pulmonary nodules-benign Colonoscopy 06/11/2022-diverticulosis in the sigmoid colon CTs 02/05/2023-no evidence of recurrent disease, stable tiny pulmonary nodules CTs 02/12/2024-no evidence of recurrent disease   Iron  deficiency anemia secondary to #1 Diabetes Peripheral neuropathy History of prostate cancer, status post prostatectomy 08/29/2012, pT2c, N0, MX, Gleason 7 Hand/foot syndrome-Xeloda  placed on hold 09/28/2021, improved Chronic mild elevation of the CEA    Disposition: Wyatt Meyer is now approximately 3 years out from the original diagnosis of colon cancer.  He remains in clinical remission.  Recent CT scans negative for recurrent/metastatic disease.  He has chronic mild elevation of the CEA, possibly related to smoking.  He plans on beginning Chantix  in the near future.  He will return for a CEA and follow-up visit in 6 months.  We are available to see him sooner if needed.    Wyatt Meyer ANP/GNP-BC   03/10/2024  10:19 AM

## 2024-04-02 ENCOUNTER — Telehealth: Payer: Self-pay | Admitting: Family Medicine

## 2024-04-02 NOTE — Telephone Encounter (Unsigned)
 Copied from CRM [id number]. Topic: Referral - Question >> Apr 02, 2024  4:41 PM Chasity T wrote: Reason for CRM: Santana from [facility] has called in because she received an referral for patient and she states due to a now show he is unable to be seen at the office.

## 2024-04-03 NOTE — Telephone Encounter (Signed)
 FYI

## 2024-04-03 NOTE — Telephone Encounter (Signed)
 Noted. We will have to refer to [facility] as he has already been dismissed from [facility]

## 2024-04-30 ENCOUNTER — Ambulatory Visit: Payer: Self-pay

## 2024-04-30 NOTE — Telephone Encounter (Signed)
 FYI Only or Action Required?: FYI only for provider.  Patient was last seen in primary care on 02/07/2024 by Kennyth Worth HERO, MD.  Called Nurse Triage reporting Flank Pain.  Symptoms began 1-2 weeks ago.  Interventions attempted: Nothing.  Symptoms are: gradually worsening.  Triage Disposition: See HCP Within 4 Hours (Or PCP Triage)  Patient/caregiver understands and will follow disposition?: No appointment until Monday, advised to go to urgent care      Copied from CRM #[id number]. Topic: Clinical - Red Word Triage >> Apr 30, 2024  2:59 PM Elle L wrote: Red Word that prompted transfer to Nurse Triage: The patient states he has been having severe back pain and he is concerned about his kidneys or a possible UTI.       Reason for Disposition  Pain or burning with passing urine (urination)  Answer Assessment - Initial Assessment Questions 1. LOCATION: Where does it hurt? (e.g., left, right)     Left sided  2. ONSET: When did the pain start?     1-2 weeks ago  3. SEVERITY: How bad is the pain? (e.g., Scale 1-10; mild, moderate, or severe)     Mild to moderate  4. PATTERN: Does the pain come and go, or is it constant?      Intermittent  5. CAUSE: What do you think is causing the pain?     Possible UTI 6. OTHER SYMPTOMS:  Do you have any other symptoms? (e.g., fever, abdomen pain, vomiting, leg weakness, burning with urination, blood in urine)     Foul smelling urine  Protocols used: Flank Pain-A-AH

## 2024-05-01 NOTE — Telephone Encounter (Signed)
 Please schedule an office visit with any open provider  I spoke with patient stated possible UTI, and blister on hands

## 2024-05-12 DIAGNOSIS — R0602 Shortness of breath: Secondary | ICD-10-CM | POA: Insufficient documentation

## 2024-05-12 DIAGNOSIS — E118 Type 2 diabetes mellitus with unspecified complications: Secondary | ICD-10-CM | POA: Insufficient documentation

## 2024-05-12 DIAGNOSIS — R931 Abnormal findings on diagnostic imaging of heart and coronary circulation: Secondary | ICD-10-CM | POA: Insufficient documentation

## 2024-05-12 NOTE — Progress Notes (Unsigned)
  Cardiology Office Note:   Date:  05/14/2024  ID:  Wyatt Meyer, DOB 14-Sep-1961, MRN [id number] PCP: Kennyth Worth HERO, MD  [facility] Providers Cardiologist:  None {  History of Present Illness:   Wyatt Meyer is a 63 y.o. male who I saw previously for evaluation of SOB.  He had coronary CT non obstructive CAD.  He still does not have the energy that he would like to have.  The patient denies any new symptoms such as chest discomfort, neck or arm discomfort. There has been no new shortness of breath, PND or orthopnea. There have been no reported palpitations, presyncope or syncope.  He just moved a lot of boxes as his wife moved back home from a job that was out of town.  He had to move her.  He does yard work.  He is not getting any new cardiovascular symptoms with this.  ROS: As stated in the HPI and negative for all other systems.  Studies Reviewed:    EKG:     NA  Risk Assessment/Calculations:              Physical Exam:   VS:  BP 137/80   Pulse 88   Ht 6' 1 (1.854 m)   Wt 230 lb (104.3 kg)   SpO2 100%   BMI 30.34 kg/m    Wt Readings from Last 3 Encounters:  05/14/24 230 lb (104.3 kg)  03/10/24 236 lb 1.6 oz (107.1 kg)  02/21/24 239 lb 12.8 oz (108.8 kg)     GEN: Well nourished, well developed in no acute distress NECK: No JVD; No carotid bruits CARDIAC: RRR, no murmurs, rubs, gallops RESPIRATORY:  Clear to auscultation without rales, wheezing or rhonchi  ABDOMEN: Soft, non-tender, non-distended EXTREMITIES:  No edema; No deformity   ASSESSMENT AND PLAN:   SOB: He does have some mild elevated BNP.  I will give him a trial of Lasix  and see if that helps with any of his dyspnea.  I am going to check an echocardiogram.   He is gena work on other risk factors and let me know if his breathing improves.   Elevated coronary calcium /CAD: He has nonobstructive coronary disease demonstrated on CT.  At this point I do not think intervention Is indicated.  He  certainly at high risk for plaque rupture given his risk factors and we had a long conversation about this.  We are going to pursue aggressive risk reduction.   Diabetes mellitus: His A1c was out of control at 12.7.  He understands the risk of this and is following up with his primary provider and endocrinologist.    Dyslipidemia: I did start him on statin previously.  In a couple of months we will check a lipid profile.  He had an LP(a) that was elevated and I will see if he qualifies for one of our primary prevention trials.  We talked about diet.  CKD: This is likely related to untreated risk factors particularly his diabetes and he is going to follow with Kennyth Worth HERO, MD and with risk reduction  Follow up with me in 6 months  Signed, Wyatt Schilling, MD

## 2024-05-14 ENCOUNTER — Encounter: Payer: Self-pay | Admitting: Cardiology

## 2024-05-14 ENCOUNTER — Ambulatory Visit: Attending: Cardiology | Admitting: Cardiology

## 2024-05-14 VITALS — BP 137/80 | HR 88 | Ht 73.0 in | Wt 230.0 lb

## 2024-05-14 DIAGNOSIS — E785 Hyperlipidemia, unspecified: Secondary | ICD-10-CM | POA: Diagnosis not present

## 2024-05-14 DIAGNOSIS — E118 Type 2 diabetes mellitus with unspecified complications: Secondary | ICD-10-CM

## 2024-05-14 DIAGNOSIS — R0602 Shortness of breath: Secondary | ICD-10-CM | POA: Diagnosis not present

## 2024-05-14 DIAGNOSIS — R931 Abnormal findings on diagnostic imaging of heart and coronary circulation: Secondary | ICD-10-CM | POA: Diagnosis not present

## 2024-05-14 MED ORDER — FUROSEMIDE 20 MG PO TABS: 20.0000 mg | ORAL_TABLET | Freq: Every day | ORAL | 0 refills | Status: DC

## 2024-05-14 NOTE — Patient Instructions (Addendum)
 Medication Instructions:  Start Lasix  20 mg once daily for 5 days, let us  know if your shortness of breath has not improved *If you need a refill on your cardiac medications before your next appointment, please call your pharmacy*  Lab Work: Fasting Lipid panel in 3 months If you have labs (blood work) drawn today and your tests are completely normal, you will receive your results only by: MyChart Message (if you have MyChart) OR A paper copy in the mail If you have any lab test that is abnormal or we need to change your treatment, we will call you to review the results.  Testing/Procedures: Echocardiogram Your physician has requested that you have an echocardiogram. Echocardiography is a painless test that uses sound waves to create images of your heart. It provides your doctor with information about the size and shape of your heart and how well your heart's chambers and valves are working. This procedure takes approximately one hour. There are no restrictions for this procedure. Please do NOT wear cologne, perfume, aftershave, or lotions (deodorant is allowed). Please arrive 15 minutes prior to your appointment time.  Please note: We ask at that you not bring children with you during ultrasound (echo/ vascular) testing. Due to room size and safety concerns, children are not allowed in the ultrasound rooms during exams. Our front office staff cannot provide observation of children in our lobby area while testing is being conducted. An adult accompanying a patient to their appointment will only be allowed in the ultrasound room at the discretion of the ultrasound technician under special circumstances. We apologize for any inconvenience.   Follow-Up: At [facility], you and your health needs are our priority.  As part of our continuing mission to provide you with exceptional heart care, our providers are all part of one team.  This team includes your primary Cardiologist (physician)  and Advanced Practice Providers or APPs (Physician Assistants and Nurse Practitioners) who all work together to provide you with the care you need, when you need it.  Your next appointment:   6 month(s)  Provider:   [place], MD  We recommend signing up for the patient portal called MyChart.  Sign up information is provided on this After Visit Summary.  MyChart is used to connect with patients for Virtual Visits (Telemedicine).  Patients are able to view lab/test results, encounter notes, upcoming appointments, etc.  Non-urgent messages can be sent to your provider as well.   To learn more about what you can do with MyChart, go to [email or web address].

## 2024-05-22 ENCOUNTER — Other Ambulatory Visit (HOSPITAL_COMMUNITY): Payer: Self-pay | Admitting: Urology

## 2024-05-22 DIAGNOSIS — C61 Malignant neoplasm of prostate: Secondary | ICD-10-CM

## 2024-06-01 ENCOUNTER — Telehealth: Payer: Self-pay | Admitting: Radiation Oncology

## 2024-06-01 ENCOUNTER — Encounter (HOSPITAL_COMMUNITY)
Admission: RE | Admit: 2024-06-01 | Discharge: 2024-06-01 | Disposition: A | Discharge status: Home / Self Care | Source: Ambulatory Visit | Attending: Urology

## 2024-06-01 DIAGNOSIS — C61 Malignant neoplasm of prostate: Secondary | ICD-10-CM | POA: Insufficient documentation

## 2024-06-01 MED ORDER — FLOTUFOLASTAT F 18 GALLIUM 296-5846 MBQ/ML IV SOLN
7.5500 | Freq: Once | INTRAVENOUS | Status: AC
Start: 2024-06-01 — End: 2024-06-01
  Administered 2024-06-01: 7.55 via INTRAVENOUS

## 2024-06-01 NOTE — Telephone Encounter (Signed)
Left message for patient to call back to schedule consult per 8/28 referral.

## 2024-06-02 ENCOUNTER — Telehealth: Payer: Self-pay | Admitting: Radiation Oncology

## 2024-06-02 ENCOUNTER — Ambulatory Visit: Admitting: Internal Medicine

## 2024-06-02 NOTE — Telephone Encounter (Signed)
Left message for patient to call back to schedule consult per 8/28 referral.

## 2024-06-03 ENCOUNTER — Other Ambulatory Visit: Payer: Self-pay | Admitting: *Deleted

## 2024-06-03 ENCOUNTER — Telehealth: Payer: Self-pay | Admitting: Radiation Oncology

## 2024-06-03 DIAGNOSIS — E1165 Type 2 diabetes mellitus with hyperglycemia: Secondary | ICD-10-CM

## 2024-06-03 MED ORDER — FREESTYLE LIBRE 2 SENSOR MISC: 5 refills | Status: DC

## 2024-06-03 NOTE — Telephone Encounter (Signed)
Left message for patient to call back to schedule consult per 8/28 referral.

## 2024-06-17 ENCOUNTER — Encounter (HOSPITAL_COMMUNITY): Payer: Self-pay | Admitting: Cardiology

## 2024-06-17 ENCOUNTER — Ambulatory Visit (HOSPITAL_COMMUNITY): Admission: RE | Admit: 2024-06-17 | Source: Ambulatory Visit | Attending: Cardiology | Admitting: Cardiology

## 2024-06-25 NOTE — Progress Notes (Signed)
 GU Location of Tumor / Histology: Prostate Ca  Radical Prostatectomy (08/2012)  PSA  5.73 02/07/2024  Gwendlyn JONETTA Hahn presented as referral from Dr. Donnice Siad ([facility]) elevated PSA.  06/01/2024 Dr. Donnice Siad NM PET (PSMA) Skull to Mid Thigh CLINICAL DATA:  PSA:  5.7 5/16; PET for Prostate cancer; Prostatectomy approx 10 yrs ago; LAPAROSCOPIC RIGHT HEMI COLECTOMY 04/2021   IMPRESSION: 1. Focal tracer avid soft tissue nodule in the left side of the prostate bed, measuring 2.0 cm with SUV max of 23.8, increased from 1.5 cm on the prior exam, concerning for recurrent prostate cancer. 2. Stable size of mildly enlarged mediastinal lymph nodes which exhibit mild tracer uptake, equivocal for nodal metastasis. No signs of tracer avid nodal metastasis within the abdomen or pelvis. 3. Small focus of tracer uptake in the posterior aspect of the T6 vertebral body with SUV max of 4.9, without corresponding lytic or sclerotic bone changes. Cannot exclude early bone metastasis.   Past/Anticipated interventions by urology, if any:  Dr. Donnice Siad   Past/Anticipated interventions by medical oncology, if any:  NA  Weight changes, if any:  No  IPSS:  4 SHIM:  6  Bowel/Bladder complaints, if any:  No  Nausea/Vomiting, if any: No  Pain issues, if any:  0/10  SAFETY ISSUES: Prior radiation?  No Pacemaker/ICD? No Possible current pregnancy? Male Is the patient on methotrexate? No  Current Complaints / other details:   None  30 minutes spent total, including time for meaningful use questions, reviewing medication, as well as spent in face-to-face time in nurse evaluation with the patient.

## 2024-06-26 ENCOUNTER — Other Ambulatory Visit: Payer: Self-pay | Admitting: Family Medicine

## 2024-06-30 ENCOUNTER — Encounter: Payer: Self-pay | Admitting: Radiation Oncology

## 2024-06-30 ENCOUNTER — Telehealth: Payer: Self-pay | Admitting: Emergency Medicine

## 2024-06-30 ENCOUNTER — Ambulatory Visit
Admission: RE | Admit: 2024-06-30 | Discharge: 2024-06-30 | Disposition: A | Source: Ambulatory Visit | Attending: Radiation Oncology | Admitting: Radiation Oncology

## 2024-06-30 ENCOUNTER — Ambulatory Visit
Admission: RE | Admit: 2024-06-30 | Discharge: 2024-06-30 | Disposition: A | Discharge status: Home / Self Care | Source: Ambulatory Visit | Attending: Radiation Oncology | Admitting: Radiation Oncology

## 2024-06-30 VITALS — BP 129/85 | HR 90 | Temp 98.3°F | Resp 18 | Ht 73.0 in | Wt 233.4 lb

## 2024-06-30 DIAGNOSIS — G629 Polyneuropathy, unspecified: Secondary | ICD-10-CM | POA: Diagnosis not present

## 2024-06-30 DIAGNOSIS — Z7985 Long-term (current) use of injectable non-insulin antidiabetic drugs: Secondary | ICD-10-CM | POA: Diagnosis not present

## 2024-06-30 DIAGNOSIS — R590 Localized enlarged lymph nodes: Secondary | ICD-10-CM | POA: Insufficient documentation

## 2024-06-30 DIAGNOSIS — Z87442 Personal history of urinary calculi: Secondary | ICD-10-CM | POA: Diagnosis not present

## 2024-06-30 DIAGNOSIS — R970 Elevated carcinoembryonic antigen [CEA]: Secondary | ICD-10-CM | POA: Diagnosis not present

## 2024-06-30 DIAGNOSIS — E785 Hyperlipidemia, unspecified: Secondary | ICD-10-CM | POA: Diagnosis not present

## 2024-06-30 DIAGNOSIS — Z794 Long term (current) use of insulin: Secondary | ICD-10-CM | POA: Insufficient documentation

## 2024-06-30 DIAGNOSIS — E1165 Type 2 diabetes mellitus with hyperglycemia: Secondary | ICD-10-CM | POA: Diagnosis not present

## 2024-06-30 DIAGNOSIS — C189 Malignant neoplasm of colon, unspecified: Secondary | ICD-10-CM

## 2024-06-30 DIAGNOSIS — F17210 Nicotine dependence, cigarettes, uncomplicated: Secondary | ICD-10-CM | POA: Diagnosis not present

## 2024-06-30 DIAGNOSIS — Z85038 Personal history of other malignant neoplasm of large intestine: Secondary | ICD-10-CM | POA: Insufficient documentation

## 2024-06-30 DIAGNOSIS — R9721 Rising PSA following treatment for malignant neoplasm of prostate: Secondary | ICD-10-CM | POA: Diagnosis not present

## 2024-06-30 DIAGNOSIS — Z79899 Other long term (current) drug therapy: Secondary | ICD-10-CM | POA: Diagnosis not present

## 2024-06-30 DIAGNOSIS — D509 Iron deficiency anemia, unspecified: Secondary | ICD-10-CM | POA: Insufficient documentation

## 2024-06-30 DIAGNOSIS — C61 Malignant neoplasm of prostate: Secondary | ICD-10-CM

## 2024-06-30 DIAGNOSIS — M1990 Unspecified osteoarthritis, unspecified site: Secondary | ICD-10-CM | POA: Insufficient documentation

## 2024-06-30 NOTE — Progress Notes (Signed)
 Radiation Oncology         [phone] ________________________________  Initial Outpatient Consultation  Name: Wyatt Meyer MRN: [id number]  Date: 06/30/2024  DOB: 08-Feb-1961  RR:Ejmxzm, Worth HERO, MD  Selma Donnice SAUNDERS, MD   REFERRING PHYSICIAN: Selma Donnice SAUNDERS, MD  DIAGNOSIS: 63 y.o. gentleman with a rising PSA of 5.73 s/p RALP 08/2012 for Stage pT2cN0, Gleason 3+4 prostate cancer now with evidence of local recurrence in prostate fossa on PSMA PET imaging    ICD-10-CM   1. Malignant neoplasm of prostate (HCC)  C61       HISTORY OF PRESENT ILLNESS: Wyatt Meyer is a 63 y.o. male with a diagnosis of locally recurrent prostate cancer. He was initially diagnosed with Gleason 3+4 prostate cancer on biopsy 03/06/12 under the care of Dr. Aleene. He opted to proceed with RALP on 08/29/12 under the care of of Dr. Zackery at [facility].  Final surgical pathology revealed Gleason 3+4 prostatic adenocarcinoma with no high-risk/adverse features. Specifically, there was evidence of extraprostatic extension or involvement of lymph nodes, seminal vesicles, or surgical margins and his postoperative PSA was undetectable.  His PSA became very low detectable at 0.02 in 02/2014 and stabilized at 0.03, where it stayed through 12/2015 before rising again to 0.06 in 09/2016. He was lost to follow up with [facility] after his follow up visit in 09/2016, and a PSA with his PCP in 06/2017 was overall stable at 0.07. He was diagnosed with adenocarcinoma of the ascending colon in 01/2021 and was treated with right hemicolectomy on 05/03/21 under the care of of Dr. Teresa with 8 cycles of adjuvant chemotherapy with Xeloda  through 11/2021 under the care of of Dr. Cloretta. He has had no evidence of recurrence of the colon cancer on annual surveillance CT C/A/P scans, most recently in 01/2024.  He had an elevated PSA at 2.8 in August 2023 and more recently, had a repeat PSA with his PCP, Dr. Kennyth, in 01/2024, which revealed  further elevation to 5.73. He was subsequently referred for re-evaluation in urology by Dr. Selma on 05/21/24. He underwent a restaging PSMA PET scan on 06/01/24 showing a 2 cm focal tracer-avid soft tissue nodule in the left side of prostate bed and stable size of mildly-enlarged mediastinal lymph nodes which exhibit mild tracer uptake, equivocal for nodal metastasis. There were no signs of tracer-avid nodal metastasis within abdomen or pelvis but there was a small focus of tracer uptake in the posterior aspect of the T6 vertebral body without corresponding lytic or sclerotic bone changes on the CT portion of the exam. On retrospective review  of his recent surveillance CT C/A/P from 02/12/24 by the radiologist, the prostate bed nodule was present and measured 1.5 cm.    The patient reviewed the imaging and PSA results with his urologist and he has kindly been referred today for discussion of potential salvage radiation treatment options.  He is accompanied by his wife, Wyatt Meyer, for today's visit.  Of note, he is a current smoker and has a chronically elevated CEA level post treatment of his colon cancer.     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/23/2015   on meds      PAST SURGICAL HISTORY: Past Surgical History:  Procedure Laterality Date   COLONOSCOPY  01/27/2021   Dr.Beavers   COLONOSCOPY WITH PROPOFOL  N/A 06/11/2022   Procedure: COLONOSCOPY WITH PROPOFOL ;  Surgeon: Eda Iha, MD;  Location: [facility] ENDOSCOPY;   Service: Gastroenterology;  Laterality: N/A;   LAPAROSCOPIC RIGHT HEMI COLECTOMY Right 05/03/2021   Procedure: LAPAROSCOPIC RIGHT HEMI COLECTOMY WITH LYSIS OF ADHESIONS AND BILATERAL TAP BLOCK;  Surgeon: Teresa Lonni HERO, MD;  Location: [facility] ORS;  Service: General;  Laterality: Right;   PROSTATE SURGERY  09/25/2011   PROSTATE SURGERY     WISDOM TOOTH EXTRACTION      FAMILY HISTORY:  Family History  Problem Relation Age of Onset   Hyperlipidemia Mother    Diabetes Mother    Stroke Mother    Heart disease Mother        Pacemaker   Heart disease Father        Heart attack no details.   Hyperlipidemia Sister    Thyroid  cancer Sister    Thyroid  cancer Sister    Esophageal cancer Neg Hx    Rectal cancer Neg Hx    Colon cancer Neg Hx    Stomach cancer Neg Hx    Colon polyps Neg Hx     SOCIAL HISTORY:  Social History   Socioeconomic History   Marital status: Married    Spouse name: Not on file   Number of children: 3   Years of education: Not on file   Highest education level: Not on file  Occupational History   Occupation: Counsellor  Tobacco Use   Smoking status: Every Day    Current packs/day: 0.50    Types: Cigarettes   Smokeless tobacco: Never   Tobacco comments:    tobacco info given 09/27/2020  Vaping Use   Vaping status: Never Used  Substance and Sexual Activity   Alcohol use: Yes    Comment: 1-2 beers per month   Drug use: No   Sexual activity: Yes  Other Topics Concern   Not on file  Social History Narrative   Married.  3 children.     Social Drivers of Corporate investment banker Strain: Not on file  Food Insecurity: No Food Insecurity (06/09/2023)   Hunger Vital Sign    Worried About Running Out of Food in the Last Year: Never true    Ran Out of Food in the Last Year: Never true  Transportation Needs: No Transportation Needs (06/09/2023)   PRAPARE - Administrator, Civil Service (Medical): No    Lack of Transportation  (Non-Medical): No  Physical Activity: Not on file  Stress: Not on file  Social Connections: Not on file  Intimate Partner Violence: Not At Risk (06/09/2023)   Humiliation, Afraid, Rape, and Kick questionnaire    Fear of Current or Ex-Partner: No    Emotionally Abused: No    Physically Abused: No    Sexually Abused: No    ALLERGIES: Codeine  MEDICATIONS:  Current Outpatient Medications  Medication Sig Dispense Refill   atorvastatin  (LIPITOR) 40 MG tablet Take 1 tablet (40 mg total) by mouth daily. 90 tablet 3   Continuous Glucose Sensor (FREESTYLE LIBRE 2 SENSOR) MISC USE AS DIRECTED TO CHECK BS 6 each 5  furosemide  (LASIX ) 20 MG tablet Take 1 tablet (20 mg total) by mouth daily. 30 tablet 0   insulin  glargine (LANTUS  SOLOSTAR) 100 UNIT/ML Solostar Pen Inject 60 Units into the skin daily. 15 mL 1   insulin  lispro (HUMALOG ) 100 UNIT/ML KwikPen Inject 2-15 Units into the skin 4 (four) times daily -  before meals and at bedtime. CBG 121 - 150: 2 units, CBG 151 - 200: 3 units, CBG 201 - 250: 5 units, CBG 251 - 300:  8 units, CBG 301 - 350: 11 units, CBG 351 - 400: 15 units, CBG > 400 call MD 15 mL 3   Insulin  Pen Needle (BD PEN NEEDLE NANO U/F) 32G X 4 MM MISC use four times a day 400 each 3   metoprolol  tartrate (LOPRESSOR ) 100 MG tablet Take 1 tablet (100 mg total) by mouth once for 1 dose. Take 90-120 minutes prior to scan. Hold for SBP less than 110. 1 tablet 0   MOUNJARO  2.5 MG/0.5ML Pen ADMINISTER 2.5 MG UNDER THE SKIN 1 TIME A WEEK 2 mL 0   ONE TOUCH LANCETS MISC Use 3x a day 200 each 11   varenicline  (CHANTIX ) 0.5 MG tablet Take 1 tablet (0.5 mg total) by mouth 2 (two) times daily. 180 tablet 3   No current facility-administered medications for this encounter.    REVIEW OF SYSTEMS:  On review of systems, the patient reports that he is doing well overall. He denies any chest pain, shortness of breath, cough, fevers, chills, night sweats, unintended weight changes. He denies any bowel  disturbances, and denies abdominal pain, nausea or vomiting. He denies any new musculoskeletal or joint aches or pains. His IPSS was 4, indicating mild urinary symptoms. His SHIM was 6, indicating he has postoperative erectile dysfunction.  He denies any pain in his thoracic spine but does have some chronic, intermittent low back pain that is exacerbated with activity.  A complete review of systems is obtained and is otherwise negative.    PHYSICAL EXAM:  Wt Readings from Last 3 Encounters:  06/30/24 233 lb 6.4 oz (105.9 kg)  05/14/24 230 lb (104.3 kg)  03/10/24 236 lb 1.6 oz (107.1 kg)   Temp Readings from Last 3 Encounters:  06/30/24 98.3 F (36.8 C) (Oral)  03/10/24 98.1 F (36.7 C) (Temporal)  02/07/24 (!) 97 F (36.1 C) (Temporal)   BP Readings from Last 3 Encounters:  06/30/24 129/85  05/14/24 137/80  03/10/24 (!) 149/76   Pulse Readings from Last 3 Encounters:  06/30/24 90  05/14/24 88  03/10/24 77   Pain Assessment Pain Score: 0-No pain/10  In general this is a well appearing African-American man in no acute distress. He's alert and oriented x4 and appropriate throughout the examination. Cardiopulmonary assessment is negative for acute distress, and he exhibits normal effort.     KPS = 100  100 - Normal; no complaints; no evidence of disease. 90   - Able to carry on normal activity; minor signs or symptoms of disease. 80   - Normal activity with effort; some signs or symptoms of disease. 58   - Cares for self; unable to carry on normal activity or to do active work. 60   - Requires occasional assistance, but is able to care for most of his personal needs. 50   - Requires considerable assistance and frequent medical care. 40   - Disabled; requires special care and assistance. 30   - Severely disabled; hospital admission is indicated although death not  imminent. 20   - Very sick; hospital admission necessary; active supportive treatment necessary. 10   - Moribund;  fatal processes progressing rapidly. 0     - Dead  Karnofsky DA, Abelmann WH, Craver LS and Burchenal [facility] [phone]) The use of the nitrogen mustards in the palliative treatment of carcinoma: with particular reference to bronchogenic carcinoma Cancer 1 [id number]  LABORATORY DATA:  Lab Results  Component Value Date   WBC 6.6 02/07/2024   HGB 11.7 (L) 02/07/2024   HCT 35.7 (L) 02/07/2024   MCV 93.3 02/07/2024   PLT 239.0 02/07/2024   Lab Results  Component Value Date   NA 135 02/12/2024   K 5.0 02/12/2024   CL 99 02/12/2024   CO2 23 02/12/2024   Lab Results  Component Value Date   ALT 15 02/07/2024   AST 18 02/07/2024   ALKPHOS 102 02/07/2024   BILITOT 0.4 02/07/2024     RADIOGRAPHY: NM PET (PSMA) SKULL TO MID THIGH Result Date: 06/06/2024 EXAM: PROSTATE PET SKULL BASE TO MID THIGHS 06/01/2024 04:30:59 PM TECHNIQUE: CT chest, abdomen and pelvis RADIOPHARMACEUTICAL: 7.55 mCi F18 Posluma  (Flotufolastat) injected intravenously. PET imaging was obtained from skull vertex to mid thighs. Computed tomography was used for attenuation correction and localization. Fusion imaging was obtained. Uptake time 1513 mins. COMPARISON: 02/12/2024 CLINICAL HISTORY: PSA: 5.7 5/16; PET for Prostate cancer; Prostatectomy approx 10 yrs ago; LAPAROSCOPIC RIGHT HEMI COLECTOMY 04/2021 FINDINGS: PROSTATE AND PROSTATE BED: Within the left side of the prostate bed there is a focal tracer avid soft tissue nodule between the bladder base and anterior wall of the rectum measuring 2.0 cm with SUV max of 23.8, axial image 195. In retrospect, this was present on the prior exam measuring 1.5 cm. LYMPH NODES: No tracer avid cervical lymph nodes. There is mild increased tracer uptake within multiple mediastinal lymph nodes, including: -High right paratracheal lymph node measures 1.6 cm with SUV max of 3.9, axial image 50. This is unchanged in size from 02/12/2024. -Subcarinal lymph node measures 1.4 cm with SUV max of 5.1, axial image  70. On the previous exam this measured the same. No tracer avid abdominal or pelvic lymph nodes. BONES: Within the posterior aspect of the T6 vertebral body there is a small focus of tracer uptake with SUV max of 4.9, axial image 75. No corresponding lytic or sclerotic bone changes identified. OTHER PET FINDINGS: No suspicious lung nodules. Mild cardiac enlargement and aortic atherosclerotic calcification. Postop changes from partial right hemicolectomy. Moderate volume of well-formed desiccated stool noted in the rectum. IMPRESSION: 1. Focal tracer avid soft tissue nodule in the left side of the prostate bed, measuring 2.0 cm with SUV max of 23.8, increased from 1.5 cm on the prior exam, concerning for recurrent prostate cancer. 2. Stable size of mildly enlarged mediastinal lymph nodes which exhibit mild tracer uptake, equivocal for nodal metastasis. No signs of tracer avid nodal metastasis within the abdomen or pelvis. 3. Small focus of tracer uptake in the posterior aspect of the T6 vertebral body with SUV max of 4.9, without corresponding lytic or sclerotic bone changes. Cannot exclude early bone metastasis. Electronically signed by: Waddell Calk MD 06/06/2024 09:30 AM EDT RP Workstation: [id number]      IMPRESSION/PLAN: 1. 63 y.o. gentleman with a rising PSA of 5.73 s/p RALP 08/2012 for Stage pT2cN0, Gleason 3+4 prostate cancer now with evidence of local recurrence in prostate fossa on PSMA PET imaging  Today we reviewed the findings and workup thus far.  We  discussed the natural history of prostate cancer.  We reviewed the the implications of positive margins, extracapsular extension, and seminal vesicle involvement on the risk of prostate cancer recurrence. In his case, there was no adverse pathology and postoperative PSA was initially undetectable. Unfortunately, the PSA is now detectable and rising and there is evidence of local recurrence on PSMA PET imaging, questionable involvement in the thoracic  spine.  We discussed additional imaging with MRI of the thoracic spine to further evaluate the activity in the T6 vertebral body.  If this indeed appears to be a metastatic lesion on MRI, we would plan to treat this with a 5 fraction course of stereotactic body radiotherapy (SBRT) while treating the prostate fossa and pelvic lymph nodes. We reviewed some of the evidence suggesting an advantage for patients who undergo salvage radiotherapy in this setting in terms of disease control and overall survival.  We discussed salvage radiation treatment directed to the prostatic fossa and pelvic lymph nodes with regard to the logistics and delivery of a 7.5-week course of daily external beam radiation treatment +/- SBRT for oligometastatic lesions.  At the conclusion of our conversation, the patient is interested in agreement to proceed with MRI of the thoracic spine to further evaluate the PET activity at T6 and determine whether this appears to be a metastatic lesion or not.  Pending those findings, he would like to move forward with the recommended 7.5 week course of daily salvage external beam therapy +/- SBRT to T6, in combination with ADT. We will share our discussion with Dr. Selma and make arrangements for a follow-up visit in the urology office, first available, to start ADT now. The patient appears to have a good understanding of his disease and the need for further evaluation prior to making formal treatment recommendations and is in agreement with the stated plan.  Therefore, we will move forward with scheduling the MRI thoracic spine and starting ADT, in anticipation of beginning IMRT approximately 2 months after the start of ADT.  2. PET positive mediastinal lymphadenopathy.  On recent PSMA PET imaging for restaging of his recurrent prostate cancer, he was noted to have stable size of mildly enlarged mediastinal lymph nodes with mild tracer uptake.  He does have a history of colon cancer and is a current  smoker so we have recommended further evaluation with bronchoscopy for tissue biopsy.  We will coordinate for a consult visit with Dr. Shelah, first available to discuss this further.  We personally spent 70 minutes in this encounter including chart review, reviewing radiological studies, meeting face-to-face with the patient, entering orders and completing documentation.     Sabra MICAEL Rusk, PA-C    Donnice Barge, MD  [facility]  Radiation Oncology Direct Dial : [phone]  Fax: [phone] [email or web address]  Skype  LinkedIn   This document serves as a record of services personally performed by Donnice Barge, MD and Sabra Rusk, PA-C. It was created on their behalf by Izetta Neither, a trained medical scribe. The creation of this record is based on the scribe's personal observations and the provider's statements to them. This document has been checked and approved by the attending provider.

## 2024-06-30 NOTE — Telephone Encounter (Signed)
 Please schedule the following:  Provider performing procedure: Cruz Bong Diagnosis: Mediastinal adenopathy Which side if for nodule / mass?  Bilateral Procedure: EBUS Has patient been spoken to by Provider and given informed consent?  Yes Anesthesia: General Do you need Fluro?  No Duration of procedure: 45 minutes Date: 07/07/2024 Alternate Date:   Time: Any Location: [facility] Does patient have OSA?  No DM?  No or Latex allergy?  No Medication Restriction/ Anticoagulate/Antiplatelet: Patient needs to stop his Mounjaro  1 week. Pre-op Labs Ordered:determined by Anesthesia Imaging request: None  (If, SuperDimension CT Chest, please have STAT courier sent to ENDO)

## 2024-07-01 ENCOUNTER — Telehealth: Payer: Self-pay | Admitting: *Deleted

## 2024-07-01 ENCOUNTER — Encounter: Payer: Self-pay | Admitting: Emergency Medicine

## 2024-07-01 NOTE — Telephone Encounter (Signed)
 I will call the patient and give appt info will send to [facility] to get auth

## 2024-07-01 NOTE — Telephone Encounter (Signed)
 RN called Mr. Noyce to inform him of scheduled bronchoscopy on 07/07/2024 per Ashlyn Bruning, PA-C.  He is to stop taking Mounjaro  1 week prior to this appointment.  Message left on his voicemail.

## 2024-07-01 NOTE — Telephone Encounter (Signed)
 I have left a detailed message for the patient with instructions the letter is also in [place]

## 2024-07-01 NOTE — Telephone Encounter (Signed)
 CALLED PATIENT'S WIFE- JUNE TO INFORM OF MRI FOR 07-07-24- ARRIVAL TIME- 4 PM  @ [facility], NO RESTRICTIONS TO SCAN

## 2024-07-02 ENCOUNTER — Telehealth: Payer: Self-pay

## 2024-07-02 ENCOUNTER — Other Ambulatory Visit (HOSPITAL_COMMUNITY): Payer: Self-pay

## 2024-07-02 NOTE — Telephone Encounter (Signed)
 Noted

## 2024-07-02 NOTE — Telephone Encounter (Signed)
 Pharmacy Patient Advocate Encounter   Received notification from Onbase that prior authorization for Insulin  Lispro 100 unit/ml kwikpen is required/requested.   Insurance verification completed.   The patient is insured through [facility]   Per test claim:  Fiasp  or Novolog  is preferred by the insurance.  If suggested medication is appropriate, Please send in a new RX and discontinue this one. If not, please advise as to why it's not appropriate so that we may request a Prior Authorization. Please note, some preferred medications may still require a PA.  If the suggested medications have not been trialed and there are no contraindications to their use, the PA will not be submitted, as it will not be approved.

## 2024-07-06 ENCOUNTER — Encounter (HOSPITAL_COMMUNITY): Payer: Self-pay | Admitting: Emergency Medicine

## 2024-07-06 ENCOUNTER — Other Ambulatory Visit: Payer: Self-pay

## 2024-07-06 NOTE — Progress Notes (Signed)
 SDW CALL  Patient was given pre-op instructions over the phone. The opportunity was given for the patient to ask questions. No further questions asked. Patient verbalized understanding of instructions given.   PCP - [place] Cardiologist - Dr. Lavona  PPM/ICD - denies   Chest x-ray -  EKG - 02/07/24 Stress Test - denies ECHO - 2018 - was scheduled for one in August but did not go Cardiac Cath - denies  Sleep Study - denies CPAP - n/a  Fasting Blood Sugar - 180-210 Pt. Wears a [employer] on left arm  Last dose of GLP1 agonist-  last dose of Ozempic was 3 weeks ago GLP1 instructions: pt does not have Ozempic at this time  Blood Thinner Instructions: n/a Aspirin  Instructions: n/a  ERAS Protcol - NPO PRE-SURGERY Ensure or G2- n/a  COVID TEST- n/a   Anesthesia review: yes - Ozempic, echo not completed in August  Patient denies shortness of breath, fever, cough and chest pain over the phone call   All instructions explained to the patient, with a verbal understanding of the material. Patient agrees to go over the instructions while at home for a better understanding.

## 2024-07-06 NOTE — Progress Notes (Signed)
 Anesthesia Chart Review:  63 yo current smoker with pertinent hx including uncontrolled IDDM2 (A1c 12.7 on 02/07/24), peripheral neuropathy, colon cancer s/p laparoscopic right colectomy 04/2021, prostate cancer s/p prostatectomy 2013.  Recently noted to have rising PSA with evidence of local recurrence on PET imaging. Pet imaging also noted stable size of mildly enlarged mediastinal lymph nodes with mild tracer uptake. He was referred to pulmonology for bronchoscopy and biopsy.  Pt was recently evaluated by cardiologist Dr. Lavona for SOB/fatigue. 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 At followup on 05/14/24 he was noted to have milsly elevated BNP. Per note, He does have some mild elevated BNP.  I will give him a trial of Lasix  and see if that helps with any of his dyspnea.  I am going to check an echocardiogram.   He is gena work on other risk factors and let me know if his breathing improves.  He has not yet had echocardiogram. Prior echo 07/2017 with EF 55-60%, grade 1 dd, normal valves.  History if difficult intubation. Per anesthesia intubation documentation 05/03/21:  Procedure Name: Intubation Date/Time: 05/03/2021 12:17 PM Performed by: Janit Clarita BRAVO, CRNA Pre-anesthesia Checklist: Patient identified, Emergency Drugs available, Suction available and Patient being monitored Patient Re-evaluated:Patient Re-evaluated prior to induction Oxygen Delivery Method: Circle system utilized Preoxygenation: Pre-oxygenation with 100% oxygen Induction Type: IV induction Ventilation: Mask ventilation without difficulty Laryngoscope Size: Glidescope and 4 Grade View: Grade III Tube type: Oral Tube size: 8.0 mm Number of attempts: 3 Airway Equipment and Method: Stylet and Oral airway Placement Confirmation: ETT inserted through vocal cords under direct vision, positive ETCO2 and breath sounds checked- equal and bilateral Secured at: 23 cm Tube secured with: Tape Dental Injury: Teeth  and Oropharynx as per pre-operative assessment  Difficulty Due To: Difficulty was anticipated, Difficult Airway- due to anterior larynx and Difficult Airway- due to dentition Comments: Attempted  intubation with Cleotilde 2 and mac4 by S.  Casimir CRNA, glidescope 4 blade with good visualization. ETT passed easily thru cords, front tooth still intact. Very deep larynx  Pt will need DOS labs and evaluation.  EKG 02/07/24: Sinus rhythm. Rate 81.  Coronary CTA 03/19/24: IMPRESSION: 1. Coronary artery calcium  score 59.3 Agatston units. This places the patient in the 71st percentile for age and gender, suggesting intermediate risk for future cardiac events.   2. Plaque noted primarily in the LAD and LCx with prominent noncalcified component. Coronary disease does not appear hemodynamically significant by CT FFR.   3. Patient is around 50th percentile for total plaque (256 mm^3), but 96% of the plaque is noncalcified suggesting higher risk.       Lynwood Geofm RIGGERS [facility] Short Stay Center/Anesthesiology Phone [phone] 07/06/2024 5:37 PM

## 2024-07-06 NOTE — Anesthesia Preprocedure Evaluation (Signed)
 Anesthesia Evaluation  Patient identified by MRN, date of birth, ID band Patient awake    Reviewed: Allergy & Precautions, NPO status , Patient's Chart, lab work & pertinent test results  Airway Mallampati: II  TM Distance: >3 FB Neck ROM: Full    Dental no notable dental hx.    Pulmonary Current Smoker and Patient abstained from smoking.   Pulmonary exam normal breath sounds clear to auscultation       Cardiovascular negative cardio ROS Normal cardiovascular exam Rhythm:Regular Rate:Normal     Neuro/Psych negative neurological ROS  negative psych ROS   GI/Hepatic negative GI ROS, Neg liver ROS,,,  Endo/Other  diabetes, Type 2    Renal/GU negative Renal ROS  negative genitourinary   Musculoskeletal  (+) Arthritis , Osteoarthritis,    Abdominal  (+) + obese  Peds negative pediatric ROS (+)  Hematology negative hematology ROS (+)   Anesthesia Other Findings   Reproductive/Obstetrics negative OB ROS                              Anesthesia Physical Anesthesia Plan  ASA: 3  Anesthesia Plan: General   Post-op Pain Management:    Induction: Intravenous  PONV Risk Score and Plan: 1 and Ondansetron  and Treatment may vary due to age or medical condition  Airway Management Planned: Oral ETT  Additional Equipment:   Intra-op Plan:   Post-operative Plan: Extubation in OR  Informed Consent: I have reviewed the patients History and Physical, chart, labs and discussed the procedure including the risks, benefits and alternatives for the proposed anesthesia with the patient or authorized representative who has indicated his/her understanding and acceptance.     Dental advisory given  Plan Discussed with: CRNA  Anesthesia Plan Comments: (PAT note by Wyatt Hope, PA-C: 63 yo current smoker with pertinent hx including uncontrolled IDDM2 (A1c 12.7 on 02/07/24), peripheral neuropathy, colon  cancer s/p laparoscopic right colectomy 04/2021, prostate cancer s/p prostatectomy 2013.  Recently noted to have rising PSA with evidence of local recurrence on PET imaging. Pet imaging also noted stable size of mildly enlarged mediastinal lymph nodes with mild tracer uptake. He was referred to pulmonology for bronchoscopy and biopsy.  Pt was recently evaluated by cardiologist Dr. Lavona for SOB/fatigue. 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 At followup on 05/14/24 he was noted to have milsly elevated BNP. Per note, He does have some mild elevated BNP.  I will give him a trial of Lasix  and see if that helps with any of his dyspnea.  I am going to check an echocardiogram.   He is gena work on other risk factors and let me know if his breathing improves.  He has not yet had echocardiogram. Prior echo 07/2017 with EF 55-60%, grade 1 dd, normal valves.  History if difficult intubation. Per anesthesia intubation documentation 05/03/21:  Procedure Name: Intubation Date/Time: 05/03/2021 12:17 PM Performed by: Janit Clarita BRAVO, CRNA Pre-anesthesia Checklist: Patient identified, Emergency Drugs available, Suction available and Patient being monitored Patient Re-evaluated:Patient Re-evaluated prior to induction Oxygen Delivery Method: Circle system utilized Preoxygenation: Pre-oxygenation with 100% oxygen Induction Type: IV induction Ventilation: Mask ventilation without difficulty Laryngoscope Size: Glidescope and 4 Grade View: Grade III Tube type: Oral Tube size: 8.0 mm Number of attempts: 3 Airway Equipment and Method: Stylet and Oral airway Placement Confirmation: ETT inserted through vocal cords under direct vision, positive ETCO2 and breath sounds checked- equal and bilateral Secured at: 23 cm Tube  secured with: Tape Dental Injury: Teeth and Oropharynx as per pre-operative assessment  Difficulty Due To: Difficulty was anticipated, Difficult Airway- due to anterior larynx and Difficult  Airway- due to dentition Comments: Attempted  intubation with Cleotilde 2 and mac4 by S.  Casimir CRNA, glidescope 4 blade with good visualization. ETT passed easily thru cords, front tooth still intact. Very deep larynx  Pt will need DOS labs and evaluation.  EKG 02/07/24: Sinus rhythm. Rate 81.  Coronary CTA 03/19/24: IMPRESSION: 1. Coronary artery calcium  score 59.3 Agatston units. This places the patient in the 71st percentile for age and gender, suggesting intermediate risk for future cardiac events.  2. Plaque noted primarily in the LAD and LCx with prominent noncalcified component. Coronary disease does not appear hemodynamically significant by CT FFR.  3. Patient is around 50th percentile for total plaque (256 mm^3), but 96% of the plaque is noncalcified suggesting higher risk.   )         Anesthesia Quick Evaluation

## 2024-07-07 ENCOUNTER — Encounter (HOSPITAL_COMMUNITY): Admission: RE | Disposition: A | Payer: Self-pay | Source: Home / Self Care | Attending: Emergency Medicine

## 2024-07-07 ENCOUNTER — Ambulatory Visit (HOSPITAL_COMMUNITY)
Admission: RE | Admit: 2024-07-07 | Discharge: 2024-07-07 | Disposition: A | Discharge status: Home / Self Care | Attending: Emergency Medicine | Admitting: Emergency Medicine

## 2024-07-07 ENCOUNTER — Encounter (HOSPITAL_COMMUNITY): Payer: Self-pay | Admitting: Emergency Medicine

## 2024-07-07 ENCOUNTER — Ambulatory Visit (HOSPITAL_COMMUNITY)
Admission: RE | Admit: 2024-07-07 | Discharge: 2024-07-07 | Disposition: A | Discharge status: Home / Self Care | Source: Ambulatory Visit | Attending: Urology | Admitting: Urology

## 2024-07-07 ENCOUNTER — Ambulatory Visit (HOSPITAL_COMMUNITY): Payer: Self-pay | Admitting: Physician Assistant

## 2024-07-07 ENCOUNTER — Other Ambulatory Visit: Payer: Self-pay

## 2024-07-07 DIAGNOSIS — E1142 Type 2 diabetes mellitus with diabetic polyneuropathy: Secondary | ICD-10-CM | POA: Diagnosis not present

## 2024-07-07 DIAGNOSIS — Z9049 Acquired absence of other specified parts of digestive tract: Secondary | ICD-10-CM | POA: Insufficient documentation

## 2024-07-07 DIAGNOSIS — Z794 Long term (current) use of insulin: Secondary | ICD-10-CM | POA: Diagnosis not present

## 2024-07-07 DIAGNOSIS — E1165 Type 2 diabetes mellitus with hyperglycemia: Secondary | ICD-10-CM | POA: Diagnosis not present

## 2024-07-07 DIAGNOSIS — E1140 Type 2 diabetes mellitus with diabetic neuropathy, unspecified: Secondary | ICD-10-CM

## 2024-07-07 DIAGNOSIS — Z9079 Acquired absence of other genital organ(s): Secondary | ICD-10-CM | POA: Diagnosis not present

## 2024-07-07 DIAGNOSIS — Z8546 Personal history of malignant neoplasm of prostate: Secondary | ICD-10-CM | POA: Insufficient documentation

## 2024-07-07 DIAGNOSIS — Z85038 Personal history of other malignant neoplasm of large intestine: Secondary | ICD-10-CM | POA: Diagnosis not present

## 2024-07-07 DIAGNOSIS — C61 Malignant neoplasm of prostate: Secondary | ICD-10-CM | POA: Insufficient documentation

## 2024-07-07 DIAGNOSIS — E785 Hyperlipidemia, unspecified: Secondary | ICD-10-CM | POA: Diagnosis not present

## 2024-07-07 DIAGNOSIS — F17210 Nicotine dependence, cigarettes, uncomplicated: Secondary | ICD-10-CM | POA: Diagnosis not present

## 2024-07-07 DIAGNOSIS — R590 Localized enlarged lymph nodes: Secondary | ICD-10-CM | POA: Diagnosis present

## 2024-07-07 DIAGNOSIS — Z7985 Long-term (current) use of injectable non-insulin antidiabetic drugs: Secondary | ICD-10-CM | POA: Diagnosis not present

## 2024-07-07 HISTORY — DX: Personal history of urinary calculi: Z87.442

## 2024-07-07 HISTORY — PX: VIDEO BRONCHOSCOPY WITH ENDOBRONCHIAL ULTRASOUND: SHX6177

## 2024-07-07 LAB — BASIC METABOLIC PANEL WITH GFR
Anion gap: 9 (ref 5–15)
BUN: 29 mg/dL — ABNORMAL HIGH (ref 8–23)
CO2: 22 mmol/L (ref 22–32)
Calcium: 9 mg/dL (ref 8.9–10.3)
Chloride: 111 mmol/L (ref 98–111)
Creatinine, Ser: 1.57 mg/dL — ABNORMAL HIGH (ref 0.61–1.24)
GFR, Estimated: 49 mL/min — ABNORMAL LOW (ref >60)
Glucose, Bld: 111 mg/dL — ABNORMAL HIGH (ref 70–99)
Potassium: 4.4 mmol/L (ref 3.5–5.1)
Sodium: 142 mmol/L (ref 135–145)

## 2024-07-07 LAB — CBC
HCT: 35.5 % — ABNORMAL LOW (ref 39.0–52.0)
Hemoglobin: 10.8 g/dL — ABNORMAL LOW (ref 13.0–17.0)
MCH: 30.5 pg (ref 26.0–34.0)
MCHC: 30.4 g/dL (ref 30.0–36.0)
MCV: 100.3 fL — ABNORMAL HIGH (ref 80.0–100.0)
Platelets: 237 K/uL (ref 150–400)
RBC: 3.54 MIL/uL — ABNORMAL LOW (ref 4.22–5.81)
RDW: 13.5 % (ref 11.5–15.5)
WBC: 7 K/uL (ref 4.0–10.5)
nRBC: 0 % (ref 0.0–0.2)

## 2024-07-07 LAB — GLUCOSE, CAPILLARY: Glucose-Capillary: 119 mg/dL — ABNORMAL HIGH (ref 70–99)

## 2024-07-07 SURGERY — BRONCHOSCOPY, WITH EBUS
Anesthesia: General | Laterality: Bilateral

## 2024-07-07 MED ORDER — SUCCINYLCHOLINE CHLORIDE 200 MG/10ML IV SOSY
PREFILLED_SYRINGE | INTRAVENOUS | Status: DC | PRN
  Administered 2024-07-07: 120 mg via INTRAVENOUS

## 2024-07-07 MED ORDER — GADOBUTROL 1 MMOL/ML IV SOLN
10.0000 mL | Freq: Once | INTRAVENOUS | Status: AC | PRN
  Administered 2024-07-07: 10 mL via INTRAVENOUS

## 2024-07-07 MED ORDER — ONDANSETRON HCL 4 MG/2ML IJ SOLN
INTRAMUSCULAR | Status: DC | PRN
  Administered 2024-07-07: 4 mg via INTRAVENOUS

## 2024-07-07 MED ORDER — DEXAMETHASONE SOD PHOSPHATE PF 10 MG/ML IJ SOLN
INTRAMUSCULAR | Status: DC | PRN
  Administered 2024-07-07: 5 mg via INTRAVENOUS

## 2024-07-07 MED ORDER — LACTATED RINGERS IV SOLN: INTRAVENOUS | Status: DC

## 2024-07-07 MED ORDER — ROCURONIUM BROMIDE 10 MG/ML (PF) SYRINGE
PREFILLED_SYRINGE | INTRAVENOUS | Status: DC | PRN
  Administered 2024-07-07: 40 mg via INTRAVENOUS

## 2024-07-07 MED ORDER — INSULIN ASPART 100 UNIT/ML IJ SOLN: 0.0000 [IU] | INTRAMUSCULAR | Status: DC | PRN

## 2024-07-07 MED ORDER — PROPOFOL 10 MG/ML IV BOLUS
INTRAVENOUS | Status: DC | PRN
  Administered 2024-07-07: 200 mg via INTRAVENOUS

## 2024-07-07 MED ORDER — SUGAMMADEX SODIUM 200 MG/2ML IV SOLN
INTRAVENOUS | Status: DC | PRN
  Administered 2024-07-07: 200 mg via INTRAVENOUS

## 2024-07-07 MED ORDER — CHLORHEXIDINE GLUCONATE 0.12 % MT SOLN
15.0000 mL | Freq: Once | OROMUCOSAL | Status: AC
Start: 2024-07-07 — End: 2024-07-07
  Administered 2024-07-07: 15 mL via OROMUCOSAL
  Filled 2024-07-07: qty 15

## 2024-07-07 MED ORDER — LIDOCAINE 2% (20 MG/ML) 5 ML SYRINGE
INTRAMUSCULAR | Status: DC | PRN
Start: 2024-07-07 — End: 2024-07-07
  Administered 2024-07-07: 100 mg via INTRAVENOUS

## 2024-07-07 MED ORDER — PROPOFOL 500 MG/50ML IV EMUL
INTRAVENOUS | Status: DC | PRN
  Administered 2024-07-07: 125 ug/kg/min via INTRAVENOUS

## 2024-07-07 MED ORDER — MIDAZOLAM HCL 2 MG/2ML IJ SOLN
INTRAMUSCULAR | Status: AC
  Filled 2024-07-07: qty 2

## 2024-07-07 MED ORDER — MIDAZOLAM HCL 2 MG/2ML IJ SOLN
INTRAMUSCULAR | Status: DC | PRN
  Administered 2024-07-07: 2 mg via INTRAVENOUS

## 2024-07-07 MED ORDER — FENTANYL CITRATE (PF) 100 MCG/2ML IJ SOLN
INTRAMUSCULAR | Status: AC
  Filled 2024-07-07: qty 2

## 2024-07-07 MED ORDER — PHENYLEPHRINE HCL-NACL 20-0.9 MG/250ML-% IV SOLN
INTRAVENOUS | Status: DC | PRN
  Administered 2024-07-07: 25 ug/min via INTRAVENOUS

## 2024-07-07 MED ORDER — FENTANYL CITRATE (PF) 250 MCG/5ML IJ SOLN
INTRAMUSCULAR | Status: DC | PRN
Start: 2024-07-07 — End: 2024-07-07
  Administered 2024-07-07: 100 ug via INTRAVENOUS

## 2024-07-07 NOTE — Transfer of Care (Signed)
 Immediate Anesthesia Transfer of Care Note  Patient: Wyatt Meyer  Procedure(s) Performed: BRONCHOSCOPY, WITH EBUS (Bilateral)  Patient Location: Endoscopy Unit  Anesthesia Type:General  Level of Consciousness: drowsy and patient cooperative  Airway & Oxygen Therapy: Patient Spontanous Breathing and Patient connected to face mask oxygen  Post-op Assessment: Report given to RN and Post -op Vital signs reviewed and stable  Post vital signs: Reviewed and stable  Last Vitals:  Vitals Value Taken Time  BP 165/80 07/07/24 12:43  Temp 36.1 C 07/07/24 12:43  Pulse 76 07/07/24 12:45  Resp 18 07/07/24 12:45  SpO2 100 % 07/07/24 12:45  Vitals shown include unfiled device data.  Last Pain:  Vitals:   07/07/24 1243  TempSrc: Temporal  PainSc:          Complications: No notable events documented.

## 2024-07-07 NOTE — Anesthesia Procedure Notes (Signed)
 Procedure Name: Intubation Date/Time: 07/07/2024 11:43 AM  Performed by: Thomasina Laurence GRADE, RNPre-anesthesia Checklist: Patient identified, Emergency Drugs available, Suction available and Patient being monitored Patient Re-evaluated:Patient Re-evaluated prior to induction Oxygen Delivery Method: Circle System Utilized Preoxygenation: Pre-oxygenation with 100% oxygen Induction Type: IV induction Ventilation: Mask ventilation without difficulty Laryngoscope Size: 4 and McGrath Grade View: Grade I Tube type: Oral Tube size: 8.5 mm Number of attempts: 1 Airway Equipment and Method: Stylet Placement Confirmation: ETT inserted through vocal cords under direct vision, positive ETCO2 and breath sounds checked- equal and bilateral Secured at: 24 cm Tube secured with: Tape Dental Injury: Teeth and Oropharynx as per pre-operative assessment

## 2024-07-07 NOTE — Discharge Instructions (Signed)
 Flexible Bronchoscopy, Care After This sheet gives you information about how to care for yourself after your test. Your doctor may also give you more specific instructions. If you have problems or questions, contact your doctor. Follow these instructions at home: Eating and drinking When you are wide awake, your numbness is gone and your cough and gag reflexes have come back, you may: Start eating only soft foods. Slowly drink liquids. Six hours after the test, go back to your normal diet. Driving Do not drive for 24 hours if you were given a medicine to help you relax (sedative). Do not drive or use heavy machinery while taking prescription pain medicine. General instructions Take over-the-counter and prescription medicines only as told by your doctor. Return to your normal activities as told. Ask what activities are safe for you. Do not use any products that have nicotine  or tobacco in them. This includes cigarettes and e-cigarettes. If you need help quitting, ask your doctor. Keep all follow-up visits as told by your doctor. This is important. It is very important if you had a tissue sample (biopsy) taken. Get help right away if: You have shortness of breath that gets worse. You get light-headed. You feel like you are going to pass out (faint). You have chest pain. You cough up: More than a little blood. More blood than before. Summary Do not use cigarettes. Do not use e-cigarettes. Seek care in the Emergency Department right away if you have chest pain or shortness of breath. Call or MyChart Message our office for any questions or problems at [phone].  Okay to restart your Mounjaro  per your usual schedule.   This information is not intended to replace advice given to you by your health care provider. Make sure you discuss any questions you have with your health care provider.

## 2024-07-07 NOTE — Op Note (Signed)
 Video Bronchoscopy with Endobronchial Ultrasound Procedure Note  Date of Operation: 07/07/2024  Pre-op Diagnosis: Mediastinal lymphadenopathy  Post-op Diagnosis: Same  Surgeon: LAMAR CHRIS  Assistants: None  Anesthesia: General endotracheal anesthesia  Operation: Flexible video fiberoptic bronchoscopy with endobronchial ultrasound and biopsies.  Estimated Blood Loss: Minimal  Complications: None apparent  Indications and History: Wyatt Meyer is a 63 y.o. male with history of prostate cancer and colon cancer.  He was found to have mediastinal adenopathy with some mild hypermetabolism on prostate-specific PET scan.  Recommendation made to achieve a tissue diagnosis via endobronchial ultrasound with biopsies.  The risks, benefits, complications, treatment options and expected outcomes were discussed with the patient.  The possibilities of pneumothorax, pneumonia, reaction to medication, pulmonary aspiration, perforation of a viscus, bleeding, failure to diagnose a condition and creating a complication requiring transfusion or operation were discussed with the patient who freely signed the consent.    Description of Procedure: The patient was examined in the preoperative area and history and data from the preprocedure consultation were reviewed. It was deemed appropriate to proceed.  The patient was taken to [facility] Endoscopy room 3, identified as Wyatt Meyer and the procedure verified as Flexible Video Fiberoptic Bronchoscopy.  A Time Out was held and the above information confirmed. After being taken to the operating room general anesthesia was initiated and the patient  was orally intubated. The video fiberoptic bronchoscope was introduced via the endotracheal tube and a general inspection was performed which showed normal airways throughout. The standard scope was then withdrawn and the endobronchial ultrasound was used to identify and characterize the peritracheal, hilar and  bronchial lymph nodes. Inspection showed enlargement at station 4L, 4R.  The most enlarged node was a low station 7 node that was accessible from the bronchus intermedius. Using real-time ultrasound guidance Wang needle biopsies were take from Station 4L, 4R, 7 nodes and were sent for cytology. The patient tolerated the procedure well without apparent complications. There was no significant blood loss. The bronchoscope was withdrawn. Anesthesia was reversed and the patient was taken to the PACU for recovery.   Samples: 1. Wang needle biopsies from 4L node 2. Wang needle biopsies from 4R node 3. Wang needle biopsies from 7 node  Plans:  The patient will be discharged from the PACU to home when recovered from anesthesia. We will review the cytology, pathology results with the patient when they become available. Outpatient followup will be with Wyatt Lites, NP and with Radiation Oncology.    Wyatt Felix S. 07/07/2024

## 2024-07-07 NOTE — H&P (Signed)
 Wyatt Meyer is an 63 y.o. male.   Chief Complaint: Mediastinal lymphadenopathy HPI: 63 year old gentleman with a history of tobacco use, prostate cancer and colon cancer.  He had some elevation of his PSA and has been tracked with prostate-specific PET scans.  The most recent PET was 06/01/2024 that had a 2 cm focal tracer avid soft tissue nodule in the left side of the prostate bed, a small focus of tracer uptake in the posterior aspect of T6, and some mildly enlarged mediastinal lymph nodes with mild tracer uptake of unclear significance.  Most prominent was in the right paratracheal region, subcarinal region.  He presents now for endobronchial ultrasound to evaluate the hilar adenopathy, mediastinal adenopathy.  He denies any significant respiratory complaints.    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/23/2016   History of kidney stones    Hyperlipidemia    on meds   Iron  deficiency anemia    on IRON  and Geritol   Klebsiella sepsis (HCC) 03/25/2015   Osteoarthritis 08/11/2012   Peripheral neuropathy 08/11/2012   Prostate cancer (HCC)    Smoker    SUI (stress urinary incontinence), male 02/03/2013   Uncontrolled diabetes mellitus with complications 03/23/2015   on meds    Past Surgical History:  Procedure Laterality Date   COLONOSCOPY  01/27/2021   Dr.Beavers   COLONOSCOPY WITH PROPOFOL  N/A 06/11/2022   Procedure: COLONOSCOPY WITH PROPOFOL ;  Surgeon: Eda Iha, MD;  Location: [facility] ENDOSCOPY;  Service: Gastroenterology;  Laterality: N/A;   LAPAROSCOPIC RIGHT HEMI COLECTOMY Right 05/03/2021   Procedure: LAPAROSCOPIC RIGHT HEMI COLECTOMY WITH LYSIS OF ADHESIONS AND BILATERAL TAP BLOCK;  Surgeon: Teresa Lonni HERO, MD;  Location: [facility] ORS;  Service: General;  Laterality: Right;   PROSTATE SURGERY  09/25/2011   PROSTATE SURGERY     WISDOM TOOTH EXTRACTION      Family History  Problem Relation  Age of Onset   Hyperlipidemia Mother    Diabetes Mother    Stroke Mother    Heart disease Mother        Pacemaker   Heart disease Father        Heart attack no details.   Hyperlipidemia Sister    Thyroid  cancer Sister    Thyroid  cancer Sister    Esophageal cancer Neg Hx    Rectal cancer Neg Hx    Colon cancer Neg Hx    Stomach cancer Neg Hx    Colon polyps Neg Hx    Social History:  reports that he has been smoking cigarettes. He has never used smokeless tobacco. He reports that he does not currently use alcohol. He reports that he does not use drugs.  Allergies:  Allergies  Allergen Reactions   Codeine Nausea And Vomiting    Medications Prior to Admission  Medication Sig Dispense Refill   atorvastatin  (LIPITOR) 40 MG tablet Take 1 tablet (40 mg total) by mouth daily. 90 tablet 3   furosemide  (LASIX ) 20 MG tablet Take 1 tablet (20 mg total) by mouth daily. 30 tablet 0   insulin  glargine (LANTUS  SOLOSTAR) 100 UNIT/ML Solostar Pen Inject 60 Units into the skin daily. 15 mL 1   insulin  lispro (HUMALOG ) 100 UNIT/ML KwikPen Inject 2-15 Units into the skin 4 (four) times daily -  before meals and at bedtime. CBG 121 - 150: 2 units, CBG 151 - 200: 3 units, CBG 201 - 250: 5 units, CBG 251 -  300:  8 units, CBG 301 - 350: 11 units, CBG 351 - 400: 15 units, CBG > 400 call MD 15 mL 3   metoprolol  tartrate (LOPRESSOR ) 100 MG tablet Take 1 tablet (100 mg total) by mouth once for 1 dose. Take 90-120 minutes prior to scan. Hold for SBP less than 110. 1 tablet 0   varenicline  (CHANTIX ) 0.5 MG tablet Take 1 tablet (0.5 mg total) by mouth 2 (two) times daily. 180 tablet 3   Continuous Glucose Sensor (FREESTYLE LIBRE 2 SENSOR) MISC USE AS DIRECTED TO CHECK BS 6 each 5   Insulin  Pen Needle (BD PEN NEEDLE NANO U/F) 32G X 4 MM MISC use four times a day 400 each 3   MOUNJARO  2.5 MG/0.5ML Pen ADMINISTER 2.5 MG UNDER THE SKIN 1 TIME A WEEK 2 mL 0   ONE TOUCH LANCETS MISC Use 3x a day 200 each 11     Results for orders placed or performed during the hospital encounter of 07/07/24 (from the past 48 hours)  Glucose, capillary     Status: Abnormal   Collection Time: 07/07/24  8:52 AM  Result Value Ref Range   Glucose-Capillary 119 (H) 70 - 99 mg/dL    Comment: Glucose reference range applies only to samples taken after fasting for at least 8 hours.  Basic metabolic panel per protocol     Status: Abnormal   Collection Time: 07/07/24  9:13 AM  Result Value Ref Range   Sodium 142 135 - 145 mmol/L   Potassium 4.4 3.5 - 5.1 mmol/L   Chloride 111 98 - 111 mmol/L   CO2 22 22 - 32 mmol/L   Glucose, Bld 111 (H) 70 - 99 mg/dL    Comment: Glucose reference range applies only to samples taken after fasting for at least 8 hours.   BUN 29 (H) 8 - 23 mg/dL   Creatinine, Ser 8.42 (H) 0.61 - 1.24 mg/dL   Calcium  9.0 8.9 - 10.3 mg/dL   GFR, Estimated 49 (L) >60 mL/min    Comment: (NOTE) Calculated using the CKD-EPI Creatinine Equation (2021)    Anion gap 9 5 - 15    Comment: Performed at [facility], [address], [place], [place] [address]  CBC per protocol     Status: Abnormal   Collection Time: 07/07/24  9:13 AM  Result Value Ref Range   WBC 7.0 4.0 - 10.5 K/uL   RBC 3.54 (L) 4.22 - 5.81 MIL/uL   Hemoglobin 10.8 (L) 13.0 - 17.0 g/dL   HCT 64.4 (L) 60.9 - 47.9 %   MCV 100.3 (H) 80.0 - 100.0 fL   MCH 30.5 26.0 - 34.0 pg   MCHC 30.4 30.0 - 36.0 g/dL   RDW 86.4 88.4 - 84.4 %   Platelets 237 150 - 400 K/uL   nRBC 0.0 0.0 - 0.2 %    Comment: Performed at [facility], [address], [place], [place] [address]   No results found.  Review of Systems As per HPI Blood pressure (!) 168/83, pulse 78, temperature 98.7 F (37.1 C), temperature source Oral, resp. rate 18, height 6' 1 (1.854 m), weight 105.7 kg, SpO2 100%. Physical Exam  Gen: Pleasant, well-nourished, in no distress,  normal affect  ENT: No lesions,  mouth clear,  oropharynx clear, no postnasal drip  Neck:  No JVD, no stridor  Lungs: No use of accessory muscles, no crackles or wheezing on normal respiration, no wheeze on forced expiration  Cardiovascular: RRR, heart sounds normal, no murmur or gallops, no peripheral edema  Abdomen: soft and NT, no HSM,  BS normal  Musculoskeletal: No deformities, no cyanosis or clubbing  Neuro: alert, awake, non focal  Skin: Warm, no lesions or rashes    Assessment/Plan Mediastinal adenopathy with mild prostate-specific PET activity of unclear significance.  Patient with a history of both prostate cancer and colon cancer.  Presents now for further evaluation of his adenopathy by endobronchial ultrasound and nodal biopsies.  Patient understands rationale, risks, benefits and agrees to proceed.  No barriers identified.  Lamar GORMAN Chris, MD 07/07/2024, 11:11 AM

## 2024-07-08 ENCOUNTER — Encounter (HOSPITAL_COMMUNITY): Payer: Self-pay | Admitting: Emergency Medicine

## 2024-07-08 LAB — CYTOLOGY - NON PAP

## 2024-07-08 NOTE — Anesthesia Postprocedure Evaluation (Signed)
 Anesthesia Post Note  Patient: Wyatt Meyer  Procedure(s) Performed: BRONCHOSCOPY, WITH EBUS (Bilateral)     Patient location during evaluation: PACU Anesthesia Type: General Level of consciousness: awake and alert Pain management: pain level controlled Vital Signs Assessment: post-procedure vital signs reviewed and stable Respiratory status: spontaneous breathing, nonlabored ventilation and respiratory function stable Cardiovascular status: blood pressure returned to baseline and stable Postop Assessment: no apparent nausea or vomiting Anesthetic complications: no   No notable events documented.  Last Vitals:  Vitals:   07/07/24 1300 07/07/24 1310  BP: (!) 148/79 (!) 169/89  Pulse: 73 72  Resp: 13 15  Temp:    SpO2: 99% 100%    Last Pain:  Vitals:   07/07/24 1310  TempSrc:   PainSc: 0-No pain                 Butler Levander Pinal

## 2024-07-10 ENCOUNTER — Telehealth: Payer: Self-pay | Admitting: *Deleted

## 2024-07-10 ENCOUNTER — Telehealth: Payer: Self-pay

## 2024-07-10 NOTE — Telephone Encounter (Signed)
 RN returned call to patient wanting to know about MRI results and next steps for starting radiation treatment.  Per Birdena Rusk, PA-C patient was told his thoracic spine MRI does not show any suspicious lesion at T6 to suggest metastasis and his bronchoscopy for the PET avid mediastinal nodes that was performed 07/07/24 was negative.  He needs to be started on ADT ASAP with Dr. Devere, with plans to start salvage XRT approximately 2 months thereafter. He was told that Vertell (navigator) will follow up with [facility] to see where we are with getting the ADT started. Once we have that date, we can look ahead at the calendar to get the CT SIM/treatment planning appointment scheduled in 08/2024 or 09/2024. Wyatt Meyer reports he has an appointment with [facility] already scheduled for 07/16/2024 to start ADT.  He really hope to finish radiation treatment in December.  He was very appreciative of the good news from results of his MRI and can't wait to get started.

## 2024-07-10 NOTE — Progress Notes (Signed)
 RN spoke with patient to follow up after recent consult.   Patient notified of MRI/Bx results being negative for any malignancy.  Patient will pick up ADT, Orgovyx, at [facility] on 07/16/24.  Will coordinate for CT Simulation for early to mid December for salvage radiation.  All questions answered, no additional needs at this time.

## 2024-07-10 NOTE — Progress Notes (Addendum)
 Late Entry from visit on 06/30/2024:   Introduced myself to the patient as the prostate nurse navigator.  He is here to discuss his radiation treatment options. Per MD recommendations patient will proceed with MRI of T-Spine, and bronchoscopy for tissue biopsy due to PET positive mediastinal lymphadenopathy.  RN will follow to ensure coordination of care.  I gave him my business card and asked him to call me with questions or concerns.  Verbalized understanding.

## 2024-07-10 NOTE — Telephone Encounter (Signed)
 xxxx

## 2024-07-13 ENCOUNTER — Encounter: Payer: Self-pay | Admitting: Acute Care

## 2024-07-13 ENCOUNTER — Ambulatory Visit: Admitting: Acute Care

## 2024-07-13 VITALS — BP 125/71 | HR 84 | Temp 98.4°F | Ht 73.0 in | Wt 244.0 lb

## 2024-07-13 DIAGNOSIS — R591 Generalized enlarged lymph nodes: Secondary | ICD-10-CM | POA: Diagnosis not present

## 2024-07-13 DIAGNOSIS — F17200 Nicotine dependence, unspecified, uncomplicated: Secondary | ICD-10-CM | POA: Diagnosis not present

## 2024-07-13 DIAGNOSIS — Z9889 Other specified postprocedural states: Secondary | ICD-10-CM

## 2024-07-13 DIAGNOSIS — R942 Abnormal results of pulmonary function studies: Secondary | ICD-10-CM

## 2024-07-13 NOTE — Patient Instructions (Addendum)
 It is good to see you today. I am glad you have done well after your bronchoscopy with biopsies. We have reviewed the results of your biopsies and they show no malignant cells. This is reassuring. I have messaged Dr. Patrcia and Dr. Cloretta to let them know the results of the biopsies. Please follow-up with both radiation oncology and medical oncology as a scheduled. Please work on quitting smoking as you are currently dealing, keep up the great work. Metta Sours with your treatment moving forward.  You can receive free nicotine  replacement therapy (patches, gum, or mints) by calling 1-800-QUIT NOW. Please call so we can get you on the path to becoming a non-smoker. I know it is hard, but you can do this!  Hypnosis for smoking cessation  [employer] [phone]  Acupuncture for smoking cessation  [employer] [phone]

## 2024-07-13 NOTE — Progress Notes (Signed)
 History of Present Illness Wyatt Meyer is a 63 y.o. male current every day smoker with history of prostate cancer and colon cancer, referred to Dr. Shelah 06/2024 for consideration of bronchoscopy with biopsies for abnormal chest imaging on a restaging PET scan .    Synopsis 3 year old gentleman current every day smoker with history of  prostate cancer and colon cancer.  He had some elevation of his PSA and has been tracked with prostate-specific PET scans.  The most recent PET was 06/01/2024 that had a 2 cm focal tracer avid soft tissue nodule in the left side of the prostate bed, a small focus of tracer uptake in the posterior aspect of T6, and some mildly enlarged mediastinal lymph nodes with mild tracer uptake of unclear significance.  Most prominent was in the right paratracheal region, subcarinal region.  He presents now for endobronchial ultrasound to evaluate the hilar adenopathy, mediastinal adenopathy.  He denies any significant respiratory complaints.    07/13/2024 Discussed the use of AI scribe software for clinical note transcription with the patient, who gave verbal consent to proceed.  History of Present Illness Wyatt Meyer is a 63 year old male with a history of prostate and colon cancer who presents for follow-up after a mediastinal lymph node biopsy. He was referred by Dr. Patrcia for a biopsy of mediastinal lymph nodes to rule out metastatic disease as he hd an abnormal staging PET scan. He is scheduled for salvage radiation treatments for salvage radiation treatment directed to the prostatic fossa and pelvic lymph nodes per radiation oncology.   He underwent a PET scan for staging purposes, which revealed slightly enlarged mediastinal lymph nodes. A biopsy was performed on the 4R, 4L, and 7 lymph nodes, showing no malignant cells. Post-procedure, he experienced no bleeding, hemoptysis, fever, or worsening dyspnea.   He has a history of prostate cancer and is preparing  to undergo hormone and radiation treatment.   He is a current smoker, which is relevant to his medical history and ongoing health concerns. He states he is taking Chantix , and is smoking very little. I will give him additional options to assist with his smoking cessation. See AVS.  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     Test Results: Cytology 07/07/2024  FINAL MICROSCOPIC DIAGNOSIS:  A. LYMPH NODE, 7, FINE NEEDLE ASPIRATION:  - No malignant cells identified  - Lymphocytes   B. LYMPH NODE, 4R, FINE NEEDLE ASPIRATION:  - No malignant cells identified  - Lymphocytes    C. LYMPH NODE, 4L, FINE NEEDLE ASPIRATION:  - No malignant cells identified  - Lymphocytes and benign bronchial cells     Latest Ref Rng & Units 07/07/2024    9:13 AM 02/07/2024    9:24 AM 06/10/2023    3:36 AM  CBC  WBC 4.0 - 10.5 K/uL 7.0  6.6  9.2   Hemoglobin 13.0 - 17.0 g/dL 89.1  88.2  89.7   Hematocrit 39.0 - 52.0 % 35.5  35.7  32.4   Platelets 150 - 400 K/uL 237  239.0  180        Latest Ref Rng & Units 07/07/2024    9:13 AM 02/12/2024   12:04 PM 02/07/2024    9:24 AM  BMP  Glucose 70 - 99 mg/dL 888  484  870   BUN 8 - 23 mg/dL 29  25  26    Creatinine 0.61 - 1.24 mg/dL 8.42  2.03  1.79   Sodium 135 - 145 mmol/L 142  135  138   Potassium 3.5 - 5.1 mmol/L 4.4  5.0  4.5   Chloride 98 - 111 mmol/L 111  99  103   CO2 22 - 32 mmol/L 22  23  29    Calcium  8.9 - 10.3 mg/dL 9.0  9.8  9.4     BNP No results found for: BNP  ProBNP    Component Value Date/Time   PROBNP 223 (H) 02/21/2024 1224    PFT No results found for: FEV1PRE, FEV1POST, FVCPRE, FVCPOST, TLC, DLCOUNC, PREFEV1FVCRT, PSTFEV1FVCRT  MR THORACIC SPINE W WO CONTRAST Result Date: 07/10/2024 EXAM: MRI THORACIC SPINE WITHOUT AND WITH INTRAVENOUS CONTRAST 07/07/2024 05:00:05 PM TECHNIQUE: Multiplanar multisequence MRI of the thoracic spine was performed without and with the  administration of 10 mL Gadavist intravenous contrast. COMPARISON: PET CT 06/01/2024. CT chest, abdomen, and pelvis 02/12/2024 and earlier. CLINICAL HISTORY: 63 year old male with T6 thoracic lesion on PET CT last month for prostate cancer. FINDINGS: Visible cervical spine; some chronic cervical disc and endplate degeneration is evident. Thoracic spine segmentation is normal on the prior CT. BONES AND ALIGNMENT: Normal alignment. Normal vertebral body heights. No abnormal enhancement. T6 vertebral marrow signal also appears normal. There is no left T6 vertebral body lesion by MRI to correspond to the PET CT finding last month. The basivertebral venous plexus there appears slightly asymmetric to the left of midline (series 23 image 12, series 19 image 11) but within normal limits. No marrow edema, no suspicious marrow lesion or enhancement identified. SPINAL CORD: Normal spinal cord volume. Normal spinal cord signal. Conus medullaris occurs below T12. No abnormal intradural enhancement or dural thickening. SOFT TISSUES: Aberrant origin of the right subclavian artery, normal variant. Otherwise grossly negative visible chest and upper abdomen. DEGENERATIVE CHANGES: Degenerative spinal stenosis is evident at C6-C7 related to disc osteophyte disease on series 17 image 12. There is no age advanced thoracic disc degeneration. No thoracic disc herniation or spinal stenosis. Chronic facet degeneration and degenerative facet hypertrophy is visible in both the lower cervical spine, at the cervicothoracic junction, and in the lower thoracic spine (most pronounced at T10-T11 in the thoracic spine). There is associated moderate to severe bilateral T1 and left T10 neural foraminal stenosis. IMPRESSION: 1. No T6 vertebral lesion by MRI to correspond to the PET finding. No acute or metastatic process identified in the Thoracic spine. 2. Generally mild for age thoracic spine degeneration. More advanced cervical degeneration, with  evidence of lower cervical spinal stenosis. Electronically signed by: Helayne Hurst MD 07/10/2024 08:29 AM EDT RP Workstation: [id number]     Past medical hx 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/23/2016   History of kidney stones    Hyperlipidemia    on meds   Iron  deficiency anemia    on IRON  and Geritol   Klebsiella sepsis (HCC) 03/25/2015   Osteoarthritis 08/11/2012   Peripheral neuropathy 08/11/2012   Prostate cancer (HCC)    Smoker    SUI (stress urinary incontinence), male 02/03/2013   Uncontrolled diabetes mellitus with complications 03/23/2015   on meds     Social History   Tobacco Use   Smoking status: Former    Current packs/day: 0.00    Types: Cigarettes    Quit date: 07/06/2024    Years since quitting: 0.0   Smokeless tobacco: Never   Tobacco  comments:    tobacco info given 09/27/2020  Vaping Use   Vaping status: Never Used  Substance Use Topics   Alcohol use: Not Currently    Comment: 1-2 beers per month   Drug use: No    Mr.Cradle reports that he quit smoking 7 days ago. His smoking use included cigarettes. He has never used smokeless tobacco. He reports that he does not currently use alcohol. He reports that he does not use drugs.  Tobacco Cessation: Counseling given: Not Answered Tobacco comments: tobacco info given 09/27/2020 Current some day smoker, on Chantix , working very hard to quit. Counseled x 3 minutes today. See AVS for resources provided to aid with smoking cessation.   Past surgical hx, Family hx, Social hx all reviewed.  Current Outpatient Medications on File Prior to Visit  Medication Sig   atorvastatin  (LIPITOR) 40 MG tablet Take 1 tablet (40 mg total) by mouth daily.   Continuous Glucose Sensor (FREESTYLE LIBRE 2 SENSOR) MISC USE AS DIRECTED TO CHECK BS   insulin  glargine (LANTUS  SOLOSTAR) 100 UNIT/ML Solostar Pen Inject 60 Units into the skin daily.    insulin  lispro (HUMALOG ) 100 UNIT/ML KwikPen Inject 2-15 Units into the skin 4 (four) times daily -  before meals and at bedtime. CBG 121 - 150: 2 units, CBG 151 - 200: 3 units, CBG 201 - 250: 5 units, CBG 251 - 300:  8 units, CBG 301 - 350: 11 units, CBG 351 - 400: 15 units, CBG > 400 call MD   Insulin  Pen Needle (BD PEN NEEDLE NANO U/F) 32G X 4 MM MISC use four times a day   metoprolol  tartrate (LOPRESSOR ) 100 MG tablet Take 1 tablet (100 mg total) by mouth once for 1 dose. Take 90-120 minutes prior to scan. Hold for SBP less than 110.   ONE TOUCH LANCETS MISC Use 3x a day   ORGOVYX 120 MG tablet Take 120 mg by mouth daily.   varenicline  (CHANTIX ) 0.5 MG tablet Take 1 tablet (0.5 mg total) by mouth 2 (two) times daily.   MOUNJARO  2.5 MG/0.5ML Pen ADMINISTER 2.5 MG UNDER THE SKIN 1 TIME A WEEK (Patient not taking: Reported on 07/13/2024)   No current facility-administered medications on file prior to visit.     Allergies  Allergen Reactions   Codeine Nausea And Vomiting    Review Of Systems:  Constitutional:   No  weight loss, night sweats,  Fevers, chills, fatigue, or  lassitude.  HEENT:   No headaches,  Difficulty swallowing,  Tooth/dental problems, or  Sore throat,                No sneezing, itching, ear ache, nasal congestion, post nasal drip,   CV:  No chest pain,  Orthopnea, PND, swelling in lower extremities, anasarca, dizziness, palpitations, syncope.   GI  No heartburn, indigestion, abdominal pain, nausea, vomiting, diarrhea, change in bowel habits, loss of appetite, bloody stools.   Resp: No shortness of breath with exertion none at rest.  No excess mucus, no productive cough,  No non-productive cough,  No coughing up of blood.  No change in color of mucus.  No wheezing.  No chest wall deformity  Skin: no rash or lesions.  GU: no dysuria, change in color of urine, no urgency or frequency.  No flank pain, no hematuria   MS:  No joint pain or swelling.  No decreased range  of motion.  No back pain.  Psych:  No change in mood or affect.  No depression or anxiety.  No memory loss.   Vital Signs BP 125/71   Pulse 84   Temp 98.4 F (36.9 C) (Oral)   Ht 6' 1 (1.854 m)   Wt 244 lb (110.7 kg)   SpO2 99%   BMI 32.19 kg/m    Physical Exam:  General- No distress,  A&Ox3, pleasant ENT: No sinus tenderness, TM clear, pale nasal mucosa, no oral exudate,no post nasal drip, no LAN Cardiac: S1, S2, regular rate and rhythm, no murmur Chest: No wheeze/ rales/ dullness; no accessory muscle use, no nasal flaring, no sternal retractions, slightly diminished per bases.  Abd.: Soft Non-tender, ND, BS +, Body mass index is 32.19 kg/m. Ext: No clubbing cyanosis, edema, no obvious deformities  Neuro:  normal strength, MAE x 4, A&O x 3 appropriate demeanor Skin: No rashes, warm and dry, no obvious skin lesions  Psych: normal mood and behavior Assessment & Plan Mediastinal lymphadenopathy, status post bronchoscopy with  biopsy Mediastinal lymphadenopathy with negative biopsy results from 4L, 4R, and 7 lymph nodes, reassuring for ruling out malignancy.  No post-procedure complications. MRI Negative for thoracic involvement - Send biopsy results to Dr. Patrcia and Dr.  Cloretta  - Continue follow-up with radiation and medical oncology teams for prostate cancer treatment.  Current Some Day smoker On Chantix  Working hard to quit Plan  Counseled today, see above.  AVS 07/13/2024 I am glad you have done well after your bronchoscopy with biopsies. We have reviewed the results of your biopsies and they show no malignant cells. This is reassuring. I have messaged Dr. Patrcia and Dr. Cloretta to let them know the results of the biopsies. Please follow-up with both radiation oncology and medical oncology as a scheduled. Please work on quitting smoking as you are currently dealing, keep up the great work. Metta Sours with your treatment moving forward.  You can receive free  nicotine  replacement therapy (patches, gum, or mints) by calling 1-800-QUIT NOW. Please call so we can get you on the path to becoming a non-smoker. I know it is hard, but you can do this!  Hypnosis for smoking cessation  [employer] [phone]  Acupuncture for smoking cessation  [employer] [phone]    I spent 25 minutes dedicated to the care of this patient on the date of this encounter to include pre-visit review of records, face-to-face time with the patient discussing conditions above, post visit ordering of testing, clinical documentation with the electronic health record, making appropriate referrals as documented, and communicating necessary information to the patient's healthcare team.    Lauraine JULIANNA Lites, NP 07/13/2024  10:14 AM

## 2024-07-15 ENCOUNTER — Telehealth: Payer: Self-pay | Admitting: Family Medicine

## 2024-07-15 NOTE — Telephone Encounter (Signed)
**Note De-identified  [place] Obfuscation** Please advise 

## 2024-07-15 NOTE — Telephone Encounter (Signed)
 Pt states RX is not covered & needs to be replaced.   RX: insulin  lispro (HUMALOG ) 100 UNIT/ML KwikPen   Replacement: Novolog  (FIASP ?)  [id number] - [place], [place] - [address] AT [facility] [address] [facility] [address] Phone: [phone]  Fax: [phone]

## 2024-07-16 NOTE — Telephone Encounter (Signed)
 He should check with his insurance for covered alternatives.

## 2024-07-17 NOTE — Telephone Encounter (Signed)
 Spoke with patient, stated drop form with information yesterday

## 2024-07-23 NOTE — Progress Notes (Signed)
 RN spoke with patient and confirmed he was able to pick up Orgovyx and started on 10/23.  RN review next steps with CT Simulation.  All questions answered.  No additional needs at this time.

## 2024-07-28 ENCOUNTER — Other Ambulatory Visit (HOSPITAL_COMMUNITY): Payer: Self-pay

## 2024-07-28 ENCOUNTER — Ambulatory Visit (INDEPENDENT_AMBULATORY_CARE_PROVIDER_SITE_OTHER): Admitting: Family Medicine

## 2024-07-28 ENCOUNTER — Encounter: Payer: Self-pay | Admitting: Family Medicine

## 2024-07-28 ENCOUNTER — Other Ambulatory Visit: Payer: Self-pay | Admitting: *Deleted

## 2024-07-28 VITALS — BP 132/70 | HR 79 | Temp 97.0°F | Ht 73.0 in | Wt 237.8 lb

## 2024-07-28 DIAGNOSIS — E1149 Type 2 diabetes mellitus with other diabetic neurological complication: Secondary | ICD-10-CM | POA: Diagnosis not present

## 2024-07-28 DIAGNOSIS — E1165 Type 2 diabetes mellitus with hyperglycemia: Secondary | ICD-10-CM | POA: Diagnosis not present

## 2024-07-28 DIAGNOSIS — J310 Chronic rhinitis: Secondary | ICD-10-CM | POA: Insufficient documentation

## 2024-07-28 DIAGNOSIS — C61 Malignant neoplasm of prostate: Secondary | ICD-10-CM | POA: Diagnosis not present

## 2024-07-28 DIAGNOSIS — R8290 Unspecified abnormal findings in urine: Secondary | ICD-10-CM | POA: Diagnosis not present

## 2024-07-28 DIAGNOSIS — Z794 Long term (current) use of insulin: Secondary | ICD-10-CM

## 2024-07-28 LAB — POCT GLYCOSYLATED HEMOGLOBIN (HGB A1C): Hemoglobin A1C: 10.6 % — AB (ref 4.0–5.6)

## 2024-07-28 LAB — URINALYSIS, ROUTINE W REFLEX MICROSCOPIC
Bilirubin Urine: NEGATIVE
Ketones, ur: NEGATIVE
Nitrite: POSITIVE — AB
Specific Gravity, Urine: 1.025 (ref 1.000–1.030)
Total Protein, Urine: >=300 — AB
Urine Glucose: 250 — AB
Urobilinogen, UA: 0.2 (ref 0.0–1.0)
pH: 6 (ref 5.0–8.0)

## 2024-07-28 MED ORDER — AZELASTINE HCL 0.1 % NA SOLN: 2.0000 | Freq: Two times a day (BID) | NASAL | 12 refills | Status: AC

## 2024-07-28 MED ORDER — FREESTYLE LIBRE 3 PLUS SENSOR MISC: 5 refills | Status: AC

## 2024-07-28 MED ORDER — TIRZEPATIDE 5 MG/0.5ML ~~LOC~~ SOAJ: 5.0000 mg | SUBCUTANEOUS | 3 refills | Status: AC

## 2024-07-28 MED ORDER — NOVOLOG FLEXPEN 100 UNIT/ML ~~LOC~~ SOPN
2.0000 [IU] | PEN_INJECTOR | Freq: Three times a day (TID) | SUBCUTANEOUS | 11 refills | Status: AC
Start: 2024-07-28 — End: ?

## 2024-07-28 MED ORDER — LANTUS SOLOSTAR 100 UNIT/ML ~~LOC~~ SOPN: 60.0000 [IU] | PEN_INJECTOR | Freq: Every day | SUBCUTANEOUS | 11 refills | Status: DC

## 2024-07-28 NOTE — Assessment & Plan Note (Signed)
 No red flags.  May be due to seasonal allergies versus vasomotor rhinitis.  Will start Astelin  nasal spray.  He will let us  know if not improving and we can refer to ENT.

## 2024-07-28 NOTE — Assessment & Plan Note (Signed)
 Discussed importance of good glycemic control.

## 2024-07-28 NOTE — Assessment & Plan Note (Signed)
 A1c uncontrolled though improved to 10.6.  He has had some difficulties with getting medications at the pharmacy due to lack of availability and insurance.  Congratulated patient on A1c reduction.  Most recent A1c was 12.7.  Will increase his Mounjaro  to 5 mg weekly.  I will send a prescription for NovoLog  as hopefully this will be covered instead of his Humalog .  He will continue Lantus  60 units daily and I will send a prescription in for freestyle libre.  We also did discuss having him follow-up with endocrinology.  He will follow-up here in 3 months if he has not been seen by endocrinology

## 2024-07-28 NOTE — Progress Notes (Signed)
 Wyatt Meyer is a 63 y.o. male who presents today for an office visit.  Assessment/Plan:  New/Acute Problems: Malodorous urine No signs of systemic illness.  Check UA and urine culture.  Encouraged hydration.  Chronic Problems Addressed Today: Type 2 diabetes mellitus with hyperglycemia (HCC) A1c uncontrolled though improved to 10.6.  He has had some difficulties with getting medications at the pharmacy due to lack of availability and insurance.  Congratulated patient on A1c reduction.  Most recent A1c was 12.7.  Will increase his Mounjaro  to 5 mg weekly.  I will send a prescription for NovoLog  as hopefully this will be covered instead of his Humalog .  He will continue Lantus  60 units daily and I will send a prescription in for freestyle libre.  We also did discuss having him follow-up with endocrinology.  He will follow-up here in 3 months if he has not been seen by endocrinology  Diabetic neuropathy [facility]) Discussed importance of good glycemic control.  Recurrent prostate cancer [facility]) Patient is following with urology and radiation oncology due to recurrence of his prostate cancer.  He is also currently on androgen blocking therapy.  Rhinitis No red flags.  May be due to seasonal allergies versus vasomotor rhinitis.  Will start Astelin  nasal spray.  He will let us  know if not improving and we can refer to ENT.     Subjective:  HPI:  See assessment / plan for status of chronic conditions.  Patient is here today for follow-up.  I last saw him 6 months ago.  At that time A1c was not controlled to 12.7.  He was continued on Lantus  and Humalog  and we did refer him to see endocrinology.  We also started him on Mounjaro  2.5 mg weekly.   I also check labs at his most recent visit which was notable for elevated PSA and we recommended that he follow-up with his urologist ASAP.  Discussed the use of AI scribe software for clinical note transcription with the patient, who gave verbal  consent to proceed.  History of Present Illness Wyatt Meyer is a 63 year old male with diabetes and recurrent cancer who presents with medication access issues and follow-up on his medical conditions.  He is experiencing significant challenges in accessing his diabetes medications. Mounjaro , which was previously affordable, is now priced at $1000 for a month's supply. He also faces issues acquiring short-acting insulin , specifically Lispro, due to prescription problems and insurance coverage issues. Additionally, his Freestyle Libre 2 sensor is no longer available, necessitating a new prescription for the updated version.  He has a history of diabetes with a previously uncontrolled A1c of 12.7 six months ago, which has improved to 10.6. He is currently on Lantus , taking 60 units. He reports fasting glucose levels ranging from 180 to 230 mg/dL. He is also on Mounjaro  at a dose of 2.5 mg.  He reports a recurrence of cancer and has started hormone therapy about two weeks ago, which has made him feel more energetic. He has an upcoming appointment for radiation mapping. He underwent MRIs and CT scans, which indicated the cancer is localized to the area where the prostate was removed.  He mentions a dental issue with a tooth falling out, which he attributes to his elevated A1c levels. He is concerned about the need to lower his A1c to prevent further complications.  He reports urinary symptoms, including a smelly odor and blood in his urine, which he noticed upon arrival at the clinic. He also  describes nasal drainage that occurs both indoors and outdoors, resembling cold symptoms, which has been ongoing for about a month. He has not had significant issues with allergies in the past.  He missed a previous endocrinology appointment due to scheduling conflicts, with overlapping appointments on the same day.         Objective:  Physical Exam: BP 132/70   Pulse 79   Temp (!) 97 F (36.1 C)  (Temporal)   Ht 6' 1 (1.854 m)   Wt 237 lb 12.8 oz (107.9 kg)   SpO2 99%   BMI 31.37 kg/m   Gen: No acute distress, resting comfortably CV: Regular rate and rhythm with no murmurs appreciated Pulm: Normal work of breathing, clear to auscultation bilaterally with no crackles, wheezes, or rhonchi Neuro: Grossly normal, moves all extremities Psych: Normal affect and thought content      Nikkie Liming M. Kennyth, MD 07/28/2024 12:30 PM

## 2024-07-28 NOTE — Patient Instructions (Signed)
 It was very nice to see you today!  VISIT SUMMARY: During your visit, we discussed your diabetes management, recurrent prostate cancer treatment, and nasal drainage issues. We addressed your medication access problems and updated your prescriptions accordingly.  YOUR PLAN: TYPE 2 DIABETES MELLITUS WITH HYPERGLYCEMIA AND DIABETIC NEUROPATHY: Your A1c has improved from 12.7 to 10.6, but you are facing challenges accessing your medications. -Increase Mounjaro  to 5 mg and prescribe for 3 months. -Prescribe Novolog  as the alternative short-acting insulin . -Refill Lantus  with maximum refills. -Prescribe Freestyle Libre 3 sensor. -Recheck urine for hematuria or infection.  RECURRENT PROSTATE CANCER: You are on hormone therapy and have an upcoming radiation therapy plan. -Continue hormone therapy. -Proceed with radiation therapy planning and mapping on December 4th. -Radiation therapy will be scheduled for 5 days on and 2 days off for 35 sessions.  CHRONIC RHINITIS: You have nasal drainage likely due to vasomotor rhinitis or seasonal allergies. -Prescribe Astelin  nasal spray for symptomatic relief.  Return in about 3 months (around 10/28/2024) for Follow Up.   Take care, Dr Kennyth  PLEASE NOTE:  If you had any lab tests, please let us  know if you have not heard back within a few days. You may see your results on mychart before we have a chance to review them but we will give you a call once they are reviewed by us .   If we ordered any referrals today, please let us  know if you have not heard from their office within the next week.   If you had any urgent prescriptions sent in today, please check with the pharmacy within an hour of our visit to make sure the prescription was transmitted appropriately.   Please try these tips to maintain a healthy lifestyle:  Eat at least 3 REAL meals and 1-2 snacks per day.  Aim for no more than 5 hours between eating.  If you eat breakfast, please do so  within one hour of getting up.   Each meal should contain half fruits/vegetables, one quarter protein, and one quarter carbs (no bigger than a computer mouse)  Cut down on sweet beverages. This includes juice, soda, and sweet tea.   Drink at least 1 glass of water with each meal and aim for at least 8 glasses per day  Exercise at least 150 minutes every week.

## 2024-07-28 NOTE — Assessment & Plan Note (Signed)
 Patient is following with urology and radiation oncology due to recurrence of his prostate cancer.  He is also currently on androgen blocking therapy.

## 2024-07-29 LAB — MICROALBUMIN / CREATININE URINE RATIO
Creatinine,U: 153.8 mg/dL
Microalb Creat Ratio: 851.6 mg/g — ABNORMAL HIGH (ref 0.0–30.0)
Microalb, Ur: 131 mg/dL — ABNORMAL HIGH (ref 0.0–1.9)

## 2024-07-30 ENCOUNTER — Ambulatory Visit: Payer: Self-pay | Admitting: Family Medicine

## 2024-07-30 DIAGNOSIS — N390 Urinary tract infection, site not specified: Secondary | ICD-10-CM

## 2024-07-30 LAB — URINE CULTURE
MICRO NUMBER:: 17188539
SPECIMEN QUALITY:: ADEQUATE

## 2024-07-30 NOTE — Progress Notes (Signed)
 Urine culture confirms UTI.  Recommend we start Macrobid  100 mg twice daily x 7 days.  Please send in prescription for patient.  He should let us  know if symptoms do not improve with this.  He did have protein in his urine but this is probably due to the UTI.  We should recheck this again He comes in though it is very important we continue to work on glucose control as we discussed at his most recent office visit.

## 2024-08-04 MED ORDER — NITROFURANTOIN MONOHYD MACRO 100 MG PO CAPS: 100.0000 mg | ORAL_CAPSULE | Freq: Two times a day (BID) | ORAL | 0 refills | Status: AC

## 2024-08-27 ENCOUNTER — Ambulatory Visit: Admitting: Radiation Oncology

## 2024-09-07 ENCOUNTER — Ambulatory Visit: Admitting: Radiation Oncology

## 2024-09-09 ENCOUNTER — Ambulatory Visit: Admitting: Oncology

## 2024-09-09 ENCOUNTER — Ambulatory Visit: Payer: Self-pay | Admitting: Oncology

## 2024-09-09 ENCOUNTER — Inpatient Hospital Stay: Attending: Oncology

## 2024-09-09 VITALS — BP 141/79 | HR 87 | Temp 97.7°F | Resp 17 | Wt 239.4 lb

## 2024-09-09 DIAGNOSIS — C61 Malignant neoplasm of prostate: Secondary | ICD-10-CM | POA: Insufficient documentation

## 2024-09-09 DIAGNOSIS — Z85038 Personal history of other malignant neoplasm of large intestine: Secondary | ICD-10-CM | POA: Insufficient documentation

## 2024-09-09 DIAGNOSIS — C182 Malignant neoplasm of ascending colon: Secondary | ICD-10-CM

## 2024-09-09 DIAGNOSIS — D509 Iron deficiency anemia, unspecified: Secondary | ICD-10-CM | POA: Insufficient documentation

## 2024-09-09 DIAGNOSIS — Z9079 Acquired absence of other genital organ(s): Secondary | ICD-10-CM | POA: Diagnosis not present

## 2024-09-09 LAB — CEA (ACCESS): CEA (CHCC): 5.71 ng/mL — ABNORMAL HIGH (ref 0.00–5.00)

## 2024-09-09 NOTE — Progress Notes (Signed)
 [facility] OFFICE PROGRESS NOTE   Diagnosis: Colon cancer  INTERVAL HISTORY:   [name] returns as scheduled.  He feels well.  No difficulty with bowel function.  No bleeding.  He has been diagnosed with local recurrence of prostate cancer.  He has been taking Relugolix for the past 2 months and will start radiation in 2 weeks.  He is scheduled to see Dr. Patrcia this week.  He reports hot flashes.  He underwent a bronchoscopy for evaluation of enlarged and mildly hypermetabolic mediastinal lymph nodes.  The pathology from lymph node biopsies returned negative for malignancy. He stopped smoking and is taking Chantix . Objective:  Vital signs in last 24 hours:  Blood pressure (!) 141/79, pulse 87, temperature 97.7 F (36.5 C), temperature source Temporal, resp. rate 17, weight 239 lb 6.4 oz (108.6 kg), SpO2 100%.    Lymphatics: No cervical, supraclavicular, axillary, or inguinal nodes Resp: Lungs clear bilaterally Cardio: Regular rate and rhythm GI: No hepatosplenomegaly, no mass, nontender Vascular: No leg edema   Lab Results:  Lab Results  Component Value Date   WBC 7.0 07/07/2024   HGB 10.8 (L) 07/07/2024   HCT 35.5 (L) 07/07/2024   MCV 100.3 (H) 07/07/2024   PLT 237 07/07/2024   NEUTROABS 4.4 05/16/2022    CMP  Lab Results  Component Value Date   NA 142 07/07/2024   K 4.4 07/07/2024   CL 111 07/07/2024   CO2 22 07/07/2024   GLUCOSE 111 (H) 07/07/2024   BUN 29 (H) 07/07/2024   CREATININE 1.57 (H) 07/07/2024   CALCIUM  9.0 07/07/2024   PROT 7.1 02/07/2024   ALBUMIN 3.9 02/07/2024   AST 18 02/07/2024   ALT 15 02/07/2024   ALKPHOS 102 02/07/2024   BILITOT 0.4 02/07/2024   GFRNONAA 49 (L) 07/07/2024   GFRAA 84 04/04/2016    Lab Results  Component Value Date   CEA 6.65 (H) 02/12/2024    Lab Results  Component Value Date   INR 1.1 04/18/2021   LABPROT 13.7 04/18/2021    Imaging:  No results found.  Medications: I have reviewed  the patient's current medications.   Assessment/Plan: Colon cancer, cecum, stage IIb (T4a N0 M0), status post a laparoscopic right colectomy 05/03/2021 0/19 lymph nodes, no perineural or lymphovascular invasion, negative resection margins, no tumor deposits, tumor extends into subserosal connective tissue with microscopic involvement of the serosa, MSS, no loss of mismatch repair protein expression CT the abdomen/pelvis 02/03/2021-no bowel obstruction, no adenopathy, probable fatty liver CT chest 02/14/2021-2 mm right middle lobe and 6 mm left upper lobe fissural nodule, nonspecific Elevated preoperative CEA, repeat CEA 06/05/2021-4.3 Guardant Reveal 06/05/2021-negative Cycle 1 adjuvant Xeloda  06/05/2021 Cycle 2 Xeloda  06/26/2021--reported he was still taking Xeloda  07/14/2021, instructed to stop further Xeloda  with the plan to resume with cycle 3 on 07/24/2021 Cycle 3 Xeloda  07/24/2021 Cycle 4 Xeloda  08/14/2021-reported he was still taking Xeloda  08/31/2021, instructed to stop further Xeloda  with the plan to resume with cycle 5 on 09/08/2021 Guardant reveal 08/31/2021-negative Cycle 5 Xeloda  09/08/2021 Xeloda  placed on hold due to hand-foot syndrome 09/28/2021 Cycle 6 Xeloda  resumed 10/16/2021, dose reduced to 1000 mg twice daily for 14 days/7 day break Cycle 7 Xeloda  11/20/2021 Cycle 8 Xeloda  12/11/2021 CTs 02/12/2022-no evidence of recurrent disease, stable tiny bilateral pulmonary nodules-benign Colonoscopy 06/11/2022-diverticulosis in the sigmoid colon CTs 02/05/2023-no evidence of recurrent disease, stable tiny pulmonary nodules CTs 02/12/2024-no evidence of recurrent disease   Iron  deficiency anemia secondary to #1 Diabetes Peripheral neuropathy  History of prostate cancer, status post prostatectomy 08/29/2012, pT2c, N0, MX, Gleason 7 PSMA PET 06/01/2024: Focal tracer avid soft tissue nodule in the left side of the prostate bed measuring 2 cm, stable mildly enlarged mediastinal nodes with mild uptake,  small focus of uptake at T6 without a corresponding lytic or sclerotic lesion MRI thoracic spine 07/07/2024: No T6 lesion, no evidence of metastatic disease Hand/foot syndrome-Xeloda  placed on hold 09/28/2021, improved Chronic mild elevation of the CEA     Disposition: Mr Wyatt Meyer is in clinical remission from colon cancer.  We will follow-up on the CEA from today.  The CEA has been chronically elevated, potentially related to tobacco use.  He will return for an office visit and CEA in 6 months. He is being followed by Dr. Selma and Dr. Patrcia for treatment of recurrent prostate cancer. He will return for an office visit and CEA in 6 months.  I am available to see him in the interim as needed.  Arley Hof, MD  09/09/2024  9:29 AM

## 2024-09-10 ENCOUNTER — Encounter: Payer: Self-pay | Admitting: Urology

## 2024-09-10 ENCOUNTER — Ambulatory Visit
Admission: RE | Admit: 2024-09-10 | Discharge: 2024-09-10 | Attending: Radiation Oncology | Admitting: Radiation Oncology

## 2024-09-10 DIAGNOSIS — Z510 Encounter for antineoplastic radiation therapy: Secondary | ICD-10-CM | POA: Insufficient documentation

## 2024-09-10 DIAGNOSIS — C61 Malignant neoplasm of prostate: Secondary | ICD-10-CM | POA: Diagnosis present

## 2024-09-10 NOTE — Progress Notes (Signed)
°  Radiation Oncology         [phone] ________________________________   Outpatient Follow up- CT Simulation Same Day   Name: Wyatt Meyer      MRN: [id number]         Date: 09/10/2024                      DOB: July 19, 1961   RR:Ejmxzm, Worth HERO, MD  Selma Donnice SAUNDERS, MD [name], MD    REFERRING PHYSICIAN: Selma Donnice SAUNDERS, MD  DIAGNOSIS: 63 y.o. gentleman with a rising PSA of 5.73 s/p RALP 08/2012 for Stage pT2cN0, Gleason 3+4 prostate cancer now with evidence of local recurrence in prostate fossa on PSMA PET imaging. Also has h/o adenocarcinoma of the ascending colon in 01/2021 and was treated with right hemicolectomy on 05/03/21 under the care of of Dr. Teresa with 8 cycles of adjuvant chemotherapy with Xeloda  through 11/2021 under the care of of Dr. Cloretta. He has had no evidence of recurrence of the colon cancer on annual surveillance CT C/A/P scans, most recently in 01/2024.   He had an elevated PSA at 2.8 in August 2023 and more recently, had a repeat PSA with his PCP, Dr. Kennyth, in 01/2024, which revealed further elevation to 5.73. He was subsequently referred for re-evaluation in urology by Dr. Selma on 05/21/24. He underwent a restaging PSMA PET scan on 06/01/24 showing a 2 cm focal tracer-avid soft tissue nodule in the left side of prostate bed and stable size of mildly-enlarged mediastinal lymph nodes which exhibit mild tracer uptake, equivocal for nodal metastasis. There were no signs of tracer-avid nodal metastasis within abdomen or pelvis but there was a small focus of tracer uptake in the posterior aspect of the T6 vertebral body without corresponding lytic or sclerotic bone changes on the CT portion of the exam. On retrospective review  of his recent surveillance CT C/A/P from 02/12/24 by the radiologist, the prostate bed nodule was present and measured 1.5 cm.   At his initial consult visit on 06/30/24, we recommended  additional imaging with MRI of the thoracic spine to further  evaluate the activity in the T6 vertebral body and a referral to Dr. Shelah in pulmonology for further evaluation of the mediastinal lymphadenopathy since he is a current smoker and has a history of colon cancer.  Since we saw him last, he had MRI Tspine on 07/07/24 that confirmed that there was no T6 vertebral lesion by MRI to correspond to the PET finding. No acute or metastatic process identified in the Thoracic spine.  He also had bronchoscopy for tissue biopsy of the mediastinal lymphadenopathy on 07/07/24, under the care of Dr. Shelah, and final pathology was negative for malignancy.  These results have been reviewed with the patient and he will proceed as planned with salvage radiation to the prostatic fossa and pelvic lymph nodes for management of his locally recurrent prostate cancer. He has freely signed written consent to proceed and a copy of this document will be placed in his medical record. He is scheduled for CT Simulation/ treatment planning this morning, in anticipation of beginning his daily radiation treatments in the near future. I will share this information with his team to keep everyone informed and look forward to continuing to participate in his care.  Sabra MICAEL Rusk, MMS, PA-C [place]  [facility] [place] Radiation Oncology Physician Assistant Direct Dial : [phone]  Fax: [phone]

## 2024-09-10 NOTE — Progress Notes (Signed)
" °  Radiation Oncology         [phone] ________________________________  Name: Wyatt Meyer MRN: [id number]  Date: 09/10/2024  DOB: Jul 06, 1961  SIMULATION AND TREATMENT PLANNING NOTE    ICD-10-CM   1. Prostate cancer (HCC)  C61     2. Recurrent prostate cancer (HCC)  C61       DIAGNOSIS:   63 y.o. gentleman with a rising PSA of 5.73 s/p RALP 08/2012 for Stage pT2cN0, Gleason 3+4 prostate cancer now with evidence of local recurrence in prostate fossa on PSMA PET imaging   NARRATIVE:  The patient was brought to the CT Simulation planning suite.  Identity was confirmed.  All relevant records and images related to the planned course of therapy were reviewed.  The patient freely provided informed written consent to proceed with treatment after reviewing the details related to the planned course of therapy. The consent form was witnessed and verified by the simulation staff.  Then, the patient was set-up in a stable reproducible supine position for radiation therapy.  A vacuum lock pillow device was custom fabricated to position his legs in a reproducible immobilized position.  Then, I performed a urethrogram under sterile conditions to identify the prostatic apex.  CT images were obtained.  Surface markings were placed.  The CT images were loaded into the planning software.  Then the prostate target and avoidance structures including the rectum, bladder, bowel and hips were contoured.  Treatment planning then occurred.  The radiation prescription was entered and confirmed.  A total of one complex treatment devices was fabricated. I have requested : Intensity Modulated Radiotherapy (IMRT) is medically necessary for this case for the following reason:  Rectal sparing.    PLAN:   The prostate fossa and pelvic lymph nodes will initially be treated to 45 Gy in 25 fractions of 1.8 Gy followed by a boost to the fossa only, to 68.4 Gy with 13 additional fractions of 1.8 Gy while boosting PET positive  recurrence to 77.5 Gy with 13 fractions of 2.5 Gy with SIB   ________________________________  Donnice FELIX Patrcia, M.D.  "

## 2024-09-21 ENCOUNTER — Ambulatory Visit

## 2024-09-21 DIAGNOSIS — Z510 Encounter for antineoplastic radiation therapy: Secondary | ICD-10-CM | POA: Diagnosis not present

## 2024-09-22 ENCOUNTER — Other Ambulatory Visit: Payer: Self-pay

## 2024-09-22 ENCOUNTER — Ambulatory Visit
Admission: RE | Admit: 2024-09-22 | Discharge: 2024-09-22 | Disposition: A | Discharge status: Home / Self Care | Source: Ambulatory Visit | Attending: Radiation Oncology | Admitting: Radiation Oncology

## 2024-09-22 DIAGNOSIS — Z510 Encounter for antineoplastic radiation therapy: Secondary | ICD-10-CM | POA: Diagnosis not present

## 2024-09-22 LAB — RAD ONC ARIA SESSION SUMMARY
Course Elapsed Days: 0
Plan Fractions Treated to Date: 1
Plan Prescribed Dose Per Fraction: 1.8 Gy
Plan Total Fractions Prescribed: 25
Plan Total Prescribed Dose: 45 Gy
Reference Point Dosage Given to Date: 1.8 Gy
Reference Point Session Dosage Given: 1.8 Gy
Session Number: 1

## 2024-09-23 ENCOUNTER — Ambulatory Visit
Admission: RE | Admit: 2024-09-23 | Discharge: 2024-09-23 | Disposition: A | Discharge status: Home / Self Care | Source: Ambulatory Visit | Attending: Radiation Oncology | Admitting: Radiation Oncology

## 2024-09-23 ENCOUNTER — Other Ambulatory Visit: Payer: Self-pay

## 2024-09-23 DIAGNOSIS — Z510 Encounter for antineoplastic radiation therapy: Secondary | ICD-10-CM | POA: Diagnosis not present

## 2024-09-23 LAB — RAD ONC ARIA SESSION SUMMARY
Course Elapsed Days: 1
Plan Fractions Treated to Date: 2
Plan Prescribed Dose Per Fraction: 1.8 Gy
Plan Total Fractions Prescribed: 25
Plan Total Prescribed Dose: 45 Gy
Reference Point Dosage Given to Date: 3.6 Gy
Reference Point Session Dosage Given: 1.8 Gy
Session Number: 2

## 2024-09-25 ENCOUNTER — Other Ambulatory Visit: Payer: Self-pay

## 2024-09-25 ENCOUNTER — Ambulatory Visit
Admission: RE | Admit: 2024-09-25 | Discharge: 2024-09-25 | Disposition: A | Source: Ambulatory Visit | Attending: Radiation Oncology

## 2024-09-25 ENCOUNTER — Ambulatory Visit
Admission: RE | Admit: 2024-09-25 | Discharge: 2024-09-25 | Disposition: A | Discharge status: Home / Self Care | Source: Ambulatory Visit | Attending: Radiation Oncology | Admitting: Radiation Oncology

## 2024-09-25 DIAGNOSIS — C61 Malignant neoplasm of prostate: Secondary | ICD-10-CM | POA: Diagnosis present

## 2024-09-25 LAB — RAD ONC ARIA SESSION SUMMARY
Course Elapsed Days: 3
Plan Fractions Treated to Date: 3
Plan Prescribed Dose Per Fraction: 1.8 Gy
Plan Total Fractions Prescribed: 25
Plan Total Prescribed Dose: 45 Gy
Reference Point Dosage Given to Date: 5.4 Gy
Reference Point Session Dosage Given: 1.8 Gy
Session Number: 3

## 2024-09-28 ENCOUNTER — Other Ambulatory Visit: Payer: Self-pay

## 2024-09-28 ENCOUNTER — Ambulatory Visit
Admission: RE | Admit: 2024-09-28 | Discharge: 2024-09-28 | Disposition: A | Discharge status: Home / Self Care | Source: Ambulatory Visit | Attending: Radiation Oncology | Admitting: Radiation Oncology

## 2024-09-28 DIAGNOSIS — C61 Malignant neoplasm of prostate: Secondary | ICD-10-CM | POA: Diagnosis not present

## 2024-09-28 LAB — RAD ONC ARIA SESSION SUMMARY
Course Elapsed Days: 6
Plan Fractions Treated to Date: 4
Plan Prescribed Dose Per Fraction: 1.8 Gy
Plan Total Fractions Prescribed: 25
Plan Total Prescribed Dose: 45 Gy
Reference Point Dosage Given to Date: 7.2 Gy
Reference Point Session Dosage Given: 1.8 Gy
Session Number: 4

## 2024-09-29 ENCOUNTER — Ambulatory Visit
Admission: RE | Admit: 2024-09-29 | Discharge: 2024-09-29 | Disposition: A | Source: Ambulatory Visit | Attending: Radiation Oncology

## 2024-09-29 ENCOUNTER — Other Ambulatory Visit: Payer: Self-pay

## 2024-09-29 DIAGNOSIS — C61 Malignant neoplasm of prostate: Secondary | ICD-10-CM | POA: Diagnosis not present

## 2024-09-29 LAB — RAD ONC ARIA SESSION SUMMARY
Course Elapsed Days: 7
Plan Fractions Treated to Date: 5
Plan Prescribed Dose Per Fraction: 1.8 Gy
Plan Total Fractions Prescribed: 25
Plan Total Prescribed Dose: 45 Gy
Reference Point Dosage Given to Date: 9 Gy
Reference Point Session Dosage Given: 1.8 Gy
Session Number: 5

## 2024-09-30 ENCOUNTER — Other Ambulatory Visit: Payer: Self-pay

## 2024-09-30 ENCOUNTER — Ambulatory Visit
Admission: RE | Admit: 2024-09-30 | Discharge: 2024-09-30 | Disposition: A | Discharge status: Home / Self Care | Source: Ambulatory Visit | Attending: Radiation Oncology | Admitting: Radiation Oncology

## 2024-09-30 DIAGNOSIS — C61 Malignant neoplasm of prostate: Secondary | ICD-10-CM | POA: Diagnosis not present

## 2024-09-30 LAB — RAD ONC ARIA SESSION SUMMARY
Course Elapsed Days: 8
Plan Fractions Treated to Date: 6
Plan Prescribed Dose Per Fraction: 1.8 Gy
Plan Total Fractions Prescribed: 25
Plan Total Prescribed Dose: 45 Gy
Reference Point Dosage Given to Date: 10.8 Gy
Reference Point Session Dosage Given: 1.8 Gy
Session Number: 6

## 2024-10-01 ENCOUNTER — Other Ambulatory Visit: Payer: Self-pay

## 2024-10-01 ENCOUNTER — Ambulatory Visit
Admission: RE | Admit: 2024-10-01 | Discharge: 2024-10-01 | Disposition: A | Discharge status: Home / Self Care | Source: Ambulatory Visit | Attending: Radiation Oncology | Admitting: Radiation Oncology

## 2024-10-01 DIAGNOSIS — C61 Malignant neoplasm of prostate: Secondary | ICD-10-CM | POA: Diagnosis not present

## 2024-10-01 LAB — RAD ONC ARIA SESSION SUMMARY
Course Elapsed Days: 9
Plan Fractions Treated to Date: 7
Plan Prescribed Dose Per Fraction: 1.8 Gy
Plan Total Fractions Prescribed: 25
Plan Total Prescribed Dose: 45 Gy
Reference Point Dosage Given to Date: 12.6 Gy
Reference Point Session Dosage Given: 1.8 Gy
Session Number: 7

## 2024-10-02 ENCOUNTER — Ambulatory Visit
Admission: RE | Admit: 2024-10-02 | Discharge: 2024-10-02 | Disposition: A | Source: Ambulatory Visit | Attending: Radiation Oncology

## 2024-10-02 ENCOUNTER — Other Ambulatory Visit: Payer: Self-pay

## 2024-10-02 DIAGNOSIS — C61 Malignant neoplasm of prostate: Secondary | ICD-10-CM | POA: Diagnosis not present

## 2024-10-02 LAB — RAD ONC ARIA SESSION SUMMARY
Course Elapsed Days: 10
Plan Fractions Treated to Date: 8
Plan Prescribed Dose Per Fraction: 1.8 Gy
Plan Total Fractions Prescribed: 25
Plan Total Prescribed Dose: 45 Gy
Reference Point Dosage Given to Date: 14.4 Gy
Reference Point Session Dosage Given: 1.8 Gy
Session Number: 8

## 2024-10-05 ENCOUNTER — Ambulatory Visit
Admission: RE | Admit: 2024-10-05 | Discharge: 2024-10-05 | Disposition: A | Source: Ambulatory Visit | Attending: Radiation Oncology

## 2024-10-05 ENCOUNTER — Other Ambulatory Visit: Payer: Self-pay

## 2024-10-05 DIAGNOSIS — C61 Malignant neoplasm of prostate: Secondary | ICD-10-CM | POA: Diagnosis not present

## 2024-10-05 LAB — RAD ONC ARIA SESSION SUMMARY
Course Elapsed Days: 13
Plan Fractions Treated to Date: 9
Plan Prescribed Dose Per Fraction: 1.8 Gy
Plan Total Fractions Prescribed: 25
Plan Total Prescribed Dose: 45 Gy
Reference Point Dosage Given to Date: 16.2 Gy
Reference Point Session Dosage Given: 1.8 Gy
Session Number: 9

## 2024-10-06 ENCOUNTER — Ambulatory Visit
Admission: RE | Admit: 2024-10-06 | Discharge: 2024-10-06 | Disposition: A | Source: Ambulatory Visit | Attending: Radiation Oncology

## 2024-10-06 ENCOUNTER — Other Ambulatory Visit: Payer: Self-pay

## 2024-10-06 DIAGNOSIS — C61 Malignant neoplasm of prostate: Secondary | ICD-10-CM | POA: Diagnosis not present

## 2024-10-06 LAB — RAD ONC ARIA SESSION SUMMARY
Course Elapsed Days: 14
Plan Fractions Treated to Date: 10
Plan Prescribed Dose Per Fraction: 1.8 Gy
Plan Total Fractions Prescribed: 25
Plan Total Prescribed Dose: 45 Gy
Reference Point Dosage Given to Date: 18 Gy
Reference Point Session Dosage Given: 1.8 Gy
Session Number: 10

## 2024-10-07 ENCOUNTER — Ambulatory Visit
Admission: RE | Admit: 2024-10-07 | Discharge: 2024-10-07 | Disposition: A | Discharge status: Home / Self Care | Source: Ambulatory Visit | Attending: Radiation Oncology | Admitting: Radiation Oncology

## 2024-10-07 ENCOUNTER — Other Ambulatory Visit: Payer: Self-pay

## 2024-10-07 ENCOUNTER — Ambulatory Visit
Admission: RE | Admit: 2024-10-07 | Discharge: 2024-10-07 | Disposition: A | Discharge status: Home / Self Care | Source: Ambulatory Visit | Admitting: Radiation Oncology

## 2024-10-07 DIAGNOSIS — C61 Malignant neoplasm of prostate: Secondary | ICD-10-CM

## 2024-10-07 LAB — RAD ONC ARIA SESSION SUMMARY
Course Elapsed Days: 15
Plan Fractions Treated to Date: 11
Plan Prescribed Dose Per Fraction: 1.8 Gy
Plan Total Fractions Prescribed: 25
Plan Total Prescribed Dose: 45 Gy
Reference Point Dosage Given to Date: 19.8 Gy
Reference Point Session Dosage Given: 1.8 Gy
Session Number: 11

## 2024-10-08 ENCOUNTER — Other Ambulatory Visit: Payer: Self-pay

## 2024-10-08 ENCOUNTER — Ambulatory Visit
Admission: RE | Admit: 2024-10-08 | Discharge: 2024-10-08 | Disposition: A | Source: Ambulatory Visit | Attending: Radiation Oncology

## 2024-10-08 DIAGNOSIS — C61 Malignant neoplasm of prostate: Secondary | ICD-10-CM | POA: Diagnosis not present

## 2024-10-08 LAB — RAD ONC ARIA SESSION SUMMARY
Course Elapsed Days: 16
Plan Fractions Treated to Date: 12
Plan Prescribed Dose Per Fraction: 1.8 Gy
Plan Total Fractions Prescribed: 25
Plan Total Prescribed Dose: 45 Gy
Reference Point Dosage Given to Date: 21.6 Gy
Reference Point Session Dosage Given: 1.8 Gy
Session Number: 12

## 2024-10-09 ENCOUNTER — Ambulatory Visit
Admission: RE | Admit: 2024-10-09 | Discharge: 2024-10-09 | Disposition: A | Discharge status: Home / Self Care | Source: Ambulatory Visit | Attending: Radiation Oncology | Admitting: Radiation Oncology

## 2024-10-09 ENCOUNTER — Other Ambulatory Visit: Payer: Self-pay

## 2024-10-09 DIAGNOSIS — C61 Malignant neoplasm of prostate: Secondary | ICD-10-CM | POA: Diagnosis not present

## 2024-10-09 LAB — RAD ONC ARIA SESSION SUMMARY
Course Elapsed Days: 17
Plan Fractions Treated to Date: 13
Plan Prescribed Dose Per Fraction: 1.8 Gy
Plan Total Fractions Prescribed: 25
Plan Total Prescribed Dose: 45 Gy
Reference Point Dosage Given to Date: 23.4 Gy
Reference Point Session Dosage Given: 1.8 Gy
Session Number: 13

## 2024-10-10 NOTE — Progress Notes (Signed)
" °  Radiation Oncology         [phone] ________________________________  Name: [name] MRN: [id number]  Date: 10/07/2024  DOB: 1960-12-07  REPEAT SIMULATION NOTE  NARRATIVE:  The patient rectal fill volume has been variable on cone beam CT, and was markedly full at initial sim.  The patient underwent repeat simulation today for ongoing radiation therapy to account for smaller rectal treatment volume.  The existing immobilizition device was used and a new CT study set was employed for the purpose of new treatment planning.  The target and avoidance structures were reviewed and in some cases modified.  Treatment planning then occurred.  The radiation boost prescription was entered and confirmed.    PLAN:  This modified radiation beam arrangement is intended to continue the current radiation dose.  ------------------------------------------------  Donnice LABOR. Patrcia, M.D.   "

## 2024-10-12 ENCOUNTER — Ambulatory Visit
Admission: RE | Admit: 2024-10-12 | Discharge: 2024-10-12 | Disposition: A | Source: Ambulatory Visit | Attending: Radiation Oncology

## 2024-10-12 ENCOUNTER — Other Ambulatory Visit: Payer: Self-pay

## 2024-10-12 DIAGNOSIS — C61 Malignant neoplasm of prostate: Secondary | ICD-10-CM | POA: Diagnosis not present

## 2024-10-12 LAB — RAD ONC ARIA SESSION SUMMARY
Course Elapsed Days: 20
Plan Fractions Treated to Date: 1
Plan Prescribed Dose Per Fraction: 1.8 Gy
Plan Total Fractions Prescribed: 12
Plan Total Prescribed Dose: 21.6 Gy
Reference Point Dosage Given to Date: 25.2 Gy
Reference Point Session Dosage Given: 1.8 Gy
Session Number: 14

## 2024-10-13 ENCOUNTER — Ambulatory Visit
Admission: RE | Admit: 2024-10-13 | Discharge: 2024-10-13 | Disposition: A | Source: Ambulatory Visit | Attending: Radiation Oncology

## 2024-10-13 ENCOUNTER — Other Ambulatory Visit: Payer: Self-pay

## 2024-10-13 DIAGNOSIS — C61 Malignant neoplasm of prostate: Secondary | ICD-10-CM | POA: Diagnosis not present

## 2024-10-13 LAB — RAD ONC ARIA SESSION SUMMARY
Course Elapsed Days: 21
Plan Fractions Treated to Date: 2
Plan Prescribed Dose Per Fraction: 1.8 Gy
Plan Total Fractions Prescribed: 12
Plan Total Prescribed Dose: 21.6 Gy
Reference Point Dosage Given to Date: 27 Gy
Reference Point Session Dosage Given: 1.8 Gy
Session Number: 15

## 2024-10-14 ENCOUNTER — Ambulatory Visit
Admission: RE | Admit: 2024-10-14 | Discharge: 2024-10-14 | Disposition: A | Source: Ambulatory Visit | Attending: Radiation Oncology

## 2024-10-14 ENCOUNTER — Other Ambulatory Visit: Payer: Self-pay

## 2024-10-14 DIAGNOSIS — C61 Malignant neoplasm of prostate: Secondary | ICD-10-CM | POA: Diagnosis not present

## 2024-10-14 LAB — RAD ONC ARIA SESSION SUMMARY
Course Elapsed Days: 22
Plan Fractions Treated to Date: 3
Plan Prescribed Dose Per Fraction: 1.8 Gy
Plan Total Fractions Prescribed: 12
Plan Total Prescribed Dose: 21.6 Gy
Reference Point Dosage Given to Date: 28.8 Gy
Reference Point Session Dosage Given: 1.8 Gy
Session Number: 16

## 2024-10-15 ENCOUNTER — Other Ambulatory Visit: Payer: Self-pay

## 2024-10-15 ENCOUNTER — Ambulatory Visit
Admission: RE | Admit: 2024-10-15 | Discharge: 2024-10-15 | Disposition: A | Discharge status: Home / Self Care | Source: Ambulatory Visit | Attending: Radiation Oncology | Admitting: Radiation Oncology

## 2024-10-15 DIAGNOSIS — C61 Malignant neoplasm of prostate: Secondary | ICD-10-CM | POA: Diagnosis not present

## 2024-10-15 LAB — RAD ONC ARIA SESSION SUMMARY
Course Elapsed Days: 23
Plan Fractions Treated to Date: 4
Plan Prescribed Dose Per Fraction: 1.8 Gy
Plan Total Fractions Prescribed: 12
Plan Total Prescribed Dose: 21.6 Gy
Reference Point Dosage Given to Date: 30.6 Gy
Reference Point Session Dosage Given: 1.8 Gy
Session Number: 17

## 2024-10-16 ENCOUNTER — Ambulatory Visit
Admission: RE | Admit: 2024-10-16 | Discharge: 2024-10-16 | Disposition: A | Source: Ambulatory Visit | Attending: Radiation Oncology

## 2024-10-16 ENCOUNTER — Other Ambulatory Visit: Payer: Self-pay

## 2024-10-16 DIAGNOSIS — C61 Malignant neoplasm of prostate: Secondary | ICD-10-CM | POA: Diagnosis not present

## 2024-10-16 LAB — RAD ONC ARIA SESSION SUMMARY
Course Elapsed Days: 24
Plan Fractions Treated to Date: 5
Plan Prescribed Dose Per Fraction: 1.8 Gy
Plan Total Fractions Prescribed: 12
Plan Total Prescribed Dose: 21.6 Gy
Reference Point Dosage Given to Date: 32.4 Gy
Reference Point Session Dosage Given: 1.8 Gy
Session Number: 18

## 2024-10-19 ENCOUNTER — Ambulatory Visit

## 2024-10-20 ENCOUNTER — Other Ambulatory Visit: Payer: Self-pay

## 2024-10-20 ENCOUNTER — Ambulatory Visit
Admission: RE | Admit: 2024-10-20 | Discharge: 2024-10-20 | Disposition: A | Source: Ambulatory Visit | Attending: Radiation Oncology

## 2024-10-20 LAB — RAD ONC ARIA SESSION SUMMARY
Course Elapsed Days: 28
Plan Fractions Treated to Date: 6
Plan Prescribed Dose Per Fraction: 1.8 Gy
Plan Total Fractions Prescribed: 12
Plan Total Prescribed Dose: 21.6 Gy
Reference Point Dosage Given to Date: 34.2 Gy
Reference Point Session Dosage Given: 1.8 Gy
Session Number: 19

## 2024-10-21 ENCOUNTER — Other Ambulatory Visit: Payer: Self-pay

## 2024-10-21 ENCOUNTER — Ambulatory Visit
Admission: RE | Admit: 2024-10-21 | Discharge: 2024-10-21 | Disposition: A | Source: Ambulatory Visit | Attending: Radiation Oncology

## 2024-10-21 LAB — RAD ONC ARIA SESSION SUMMARY
Course Elapsed Days: 29
Plan Fractions Treated to Date: 7
Plan Prescribed Dose Per Fraction: 1.8 Gy
Plan Total Fractions Prescribed: 12
Plan Total Prescribed Dose: 21.6 Gy
Reference Point Dosage Given to Date: 36 Gy
Reference Point Session Dosage Given: 1.8 Gy
Session Number: 20

## 2024-10-22 ENCOUNTER — Ambulatory Visit

## 2024-10-23 ENCOUNTER — Ambulatory Visit

## 2024-10-23 ENCOUNTER — Other Ambulatory Visit: Payer: Self-pay

## 2024-10-23 ENCOUNTER — Ambulatory Visit
Admission: RE | Admit: 2024-10-23 | Discharge: 2024-10-23 | Disposition: A | Source: Ambulatory Visit | Attending: Radiation Oncology

## 2024-10-23 LAB — RAD ONC ARIA SESSION SUMMARY
Course Elapsed Days: 31
Plan Fractions Treated to Date: 8
Plan Prescribed Dose Per Fraction: 1.8 Gy
Plan Total Fractions Prescribed: 12
Plan Total Prescribed Dose: 21.6 Gy
Reference Point Dosage Given to Date: 37.8 Gy
Reference Point Session Dosage Given: 1.8 Gy
Session Number: 21

## 2024-10-26 ENCOUNTER — Ambulatory Visit

## 2024-10-27 ENCOUNTER — Other Ambulatory Visit: Payer: Self-pay

## 2024-10-27 ENCOUNTER — Ambulatory Visit

## 2024-10-27 ENCOUNTER — Ambulatory Visit
Admission: RE | Admit: 2024-10-27 | Discharge: 2024-10-27 | Disposition: A | Source: Ambulatory Visit | Attending: Radiation Oncology

## 2024-10-27 LAB — RAD ONC ARIA SESSION SUMMARY
Course Elapsed Days: 35
Plan Fractions Treated to Date: 9
Plan Prescribed Dose Per Fraction: 1.8 Gy
Plan Total Fractions Prescribed: 12
Plan Total Prescribed Dose: 21.6 Gy
Reference Point Dosage Given to Date: 39.6 Gy
Reference Point Session Dosage Given: 1.8 Gy
Session Number: 22

## 2024-10-28 ENCOUNTER — Ambulatory Visit

## 2024-10-28 ENCOUNTER — Other Ambulatory Visit: Payer: Self-pay

## 2024-10-28 ENCOUNTER — Ambulatory Visit
Admission: RE | Admit: 2024-10-28 | Discharge: 2024-10-28 | Disposition: A | Source: Ambulatory Visit | Attending: Radiation Oncology

## 2024-10-28 ENCOUNTER — Ambulatory Visit: Admitting: Family Medicine

## 2024-10-28 VITALS — BP 120/60 | HR 92 | Temp 97.2°F | Ht 73.0 in | Wt 237.4 lb

## 2024-10-28 DIAGNOSIS — L301 Dyshidrosis [pompholyx]: Secondary | ICD-10-CM | POA: Insufficient documentation

## 2024-10-28 DIAGNOSIS — E1149 Type 2 diabetes mellitus with other diabetic neurological complication: Secondary | ICD-10-CM

## 2024-10-28 DIAGNOSIS — Z794 Long term (current) use of insulin: Secondary | ICD-10-CM

## 2024-10-28 DIAGNOSIS — C61 Malignant neoplasm of prostate: Secondary | ICD-10-CM

## 2024-10-28 LAB — RAD ONC ARIA SESSION SUMMARY
Course Elapsed Days: 36
Plan Fractions Treated to Date: 10
Plan Prescribed Dose Per Fraction: 1.8 Gy
Plan Total Fractions Prescribed: 12
Plan Total Prescribed Dose: 21.6 Gy
Reference Point Dosage Given to Date: 41.4 Gy
Reference Point Session Dosage Given: 1.8 Gy
Session Number: 23

## 2024-10-28 LAB — POCT GLYCOSYLATED HEMOGLOBIN (HGB A1C): Hemoglobin A1C: 11 % — AB (ref 4.0–5.6)

## 2024-10-28 LAB — MICROALBUMIN / CREATININE URINE RATIO
Creatinine,U: 235 mg/dL
Microalb Creat Ratio: 483.2 mg/g — ABNORMAL HIGH (ref 0.0–30.0)
Microalb, Ur: 113.6 mg/dL — ABNORMAL HIGH (ref 0.7–1.9)

## 2024-10-28 MED ORDER — CLOBETASOL PROPIONATE 0.05 % EX OINT: 1.0000 | TOPICAL_OINTMENT | Freq: Two times a day (BID) | CUTANEOUS | 0 refills | Status: AC

## 2024-10-28 NOTE — Progress Notes (Signed)
 "  Wyatt Meyer is a 64 y.o. male who presents today for an office visit.  Assessment/Plan:  Chronic Problems Addressed Today: Type 2 diabetes mellitus with hyperglycemia (HCC) A1c not controlled at 11.  He is currently on Mounjaro  5 mg weekly, Lantus  60 units daily and NovoLog  as needed.  He does admit to several dietary indiscretions and has also missed several doses of his Mounjaro  due to supply issues with the pharmacy.  As we have done at his numerous previous appointments we did discuss importance of maintaining glycemic control to prevent downstream complications.  We recommended increasing Mounjaro  to 7.5 mg weekly however he declined as he feels like the dose is adequate if he can get it.  He would like to continue with his current regimen for now and continue to work on dietary modification.  He will come back here in 3 months to recheck A1c though he will reach out to us  in the interim if he has any issues with his sugar or if he changes his mind about medication changes.  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  This has been a recurrent issue for him for many years.  Will start topical clobetasol .  He will let us  know if not improving.  Diabetic neuropathy (HCC) As above, we discussed importance of maintaining adequate glycemic control.  Recheck A1c in 3 months.  Recurrent prostate cancer [facility]) Following with urology and radiation oncology.  He is currently in the middle of his radiation therapies but will finishing this up in a few weeks.     Subjective:  HPI:  See assessment / plan for status of chronic conditions.   Discussed the use of AI scribe software for clinical note transcription with the patient, who gave verbal consent to proceed.  History of Present Illness Wyatt Meyer is a 64 year old male with diabetes who presents for management of blood sugar levels.  He is currently undergoing radiation therapy  and has completed 22 out of 38 sessions. He experiences fatigue and reports that his energy levels have been low. His energy levels have been low.  He reports instability in his feet and ankles, describing a sensation of walking on pads. He associates this with his diabetes management and mentions that when using Mounjaro , he requires less insulin . However, he has had difficulty obtaining Mounjaro  from the pharmacy, leading to missed doses. He is unsure of the exact number of missed doses. His blood sugar levels remain high, with a recent reading of 11 mmol/L.  He experiences fluid-filled blisters on his hands and feet, which he has had in the past. These blisters are not seasonal and have occurred in both winter and summer. He typically pops them when they become large. He has not used any specific treatment for these blisters previously.  He reports a decrease in urination frequency, which he associates with better blood sugar control. He has not been exercising but states that his diet has been fairly well maintained, although he has struggled with consistency over the past week due to family obligations.  He is currently taking Mounjaro  and uses Lantus  insulin  as needed when his blood sugar levels are high. He has not seen an endocrinologist yet, despite a previous referral.  He experiences diarrhea, which he is unsure if it is related to Mounjaro  or other factors.         Objective:  Physical Exam: BP 120/60   Pulse 92   Temp ROLLEN)  97.2 F (36.2 C) (Temporal)   Ht 6' 1 (1.854 m)   Wt 237 lb 6.4 oz (107.7 kg)   SpO2 99%   BMI 31.32 kg/m   Gen: No acute distress, resting comfortably CV: Regular rate and rhythm with no murmurs appreciated Pulm: Normal work of breathing, clear to auscultation bilaterally with no crackles, wheezes, or rhonchi Neuro: Grossly normal, moves all extremities Psych: Normal affect and thought content      Taeshawn Helfman M. Kennyth, MD 10/28/2024 8:50 AM  "

## 2024-10-28 NOTE — Assessment & Plan Note (Signed)
 Patient with scattered fluid-filled vesicles on hand consistent with dyshidrosis.  This has been a recurrent issue for him for many years.  Will start topical clobetasol .  He will let us  know if not improving.

## 2024-10-28 NOTE — Assessment & Plan Note (Signed)
 A1c not controlled at 11.  He is currently on Mounjaro  5 mg weekly, Lantus  60 units daily and NovoLog  as needed.  He does admit to several dietary indiscretions and has also missed several doses of his Mounjaro  due to supply issues with the pharmacy.  As we have done at his numerous previous appointments we did discuss importance of maintaining glycemic control to prevent downstream complications.  We recommended increasing Mounjaro  to 7.5 mg weekly however he declined as he feels like the dose is adequate if he can get it.  He would like to continue with his current regimen for now and continue to work on dietary modification.  He will come back here in 3 months to recheck A1c though he will reach out to us  in the interim if he has any issues with his sugar or if he changes his mind about medication changes.

## 2024-10-28 NOTE — Assessment & Plan Note (Signed)
 As above, we discussed importance of maintaining adequate glycemic control.  Recheck A1c in 3 months.

## 2024-10-28 NOTE — Patient Instructions (Addendum)
 It was very nice to see you today!  VISIT SUMMARY: During your visit, we discussed the management of your diabetes, including your blood sugar levels, medication adherence, and symptoms. We also addressed your recurrent blisters and ongoing prostate cancer treatment.  YOUR PLAN: TYPE 2 DIABETES MELLITUS WITH HYPERGLYCEMIA AND DIABETIC NEUROPATHY: Your blood sugar levels are high, and you are experiencing symptoms like fatigue and instability in your feet and ankles. -Continue taking Mounjaro  at the current dose. -Use Lantus  insulin  as needed for high blood sugar levels. -Adhere consistently to your medication, dietary management, and exercise as tolerated. -A urine specimen has been ordered to check your kidney function. -You have been referred to endocrinology for diabetes management.  DYSHIDROSIS (POMPHOLYX): You have recurrent fluid-filled blisters on your hands and feet. -Use the prescribed topical cream for treatment.  PROSTATE CANCER, STATUS POST RADIATION AND HORMONE THERAPY: You are undergoing radiation therapy and experiencing fatigue. -Monitor your fatigue levels and adjust management as needed.  Return in about 3 months (around 01/25/2025).   Take care, Dr Kennyth  PLEASE NOTE:  If you had any lab tests, please let us  know if you have not heard back within a few days. You may see your results on mychart before we have a chance to review them but we will give you a call once they are reviewed by us .   If we ordered any referrals today, please let us  know if you have not heard from their office within the next week.   If you had any urgent prescriptions sent in today, please check with the pharmacy within an hour of our visit to make sure the prescription was transmitted appropriately.   Please try these tips to maintain a healthy lifestyle:  Eat at least 3 REAL meals and 1-2 snacks per day.  Aim for no more than 5 hours between eating.  If you eat breakfast, please do so  within one hour of getting up.   Each meal should contain half fruits/vegetables, one quarter protein, and one quarter carbs (no bigger than a computer mouse)  Cut down on sweet beverages. This includes juice, soda, and sweet tea.   Drink at least 1 glass of water with each meal and aim for at least 8 glasses per day  Exercise at least 150 minutes every week.

## 2024-10-28 NOTE — Assessment & Plan Note (Signed)
 Following with urology and radiation oncology.  He is currently in the middle of his radiation therapies but will finishing this up in a few weeks.

## 2024-10-29 ENCOUNTER — Ambulatory Visit

## 2024-10-29 ENCOUNTER — Telehealth: Payer: Self-pay | Admitting: Radiation Oncology

## 2024-10-29 ENCOUNTER — Ambulatory Visit: Payer: Self-pay | Admitting: Family Medicine

## 2024-10-29 DIAGNOSIS — E1121 Type 2 diabetes mellitus with diabetic nephropathy: Secondary | ICD-10-CM | POA: Insufficient documentation

## 2024-10-29 NOTE — Telephone Encounter (Signed)
 Pt called to advise he would be missing today's appt due to having car troubles and being stranded. Email sent to Support RTT.

## 2024-10-29 NOTE — Progress Notes (Signed)
 He has a lot of protein in his urine.  This is from the diabetes.  We can start a medication to help protect his kidneys called losartan.  This is also a blood pressure medication.  Please send in losartan 25 mg daily if he is agreeable.  We should recheck again in a few months.

## 2024-10-30 ENCOUNTER — Other Ambulatory Visit: Payer: Self-pay

## 2024-10-30 ENCOUNTER — Ambulatory Visit

## 2024-10-30 ENCOUNTER — Ambulatory Visit: Admission: RE | Admit: 2024-10-30

## 2024-10-30 LAB — RAD ONC ARIA SESSION SUMMARY
Course Elapsed Days: 38
Plan Fractions Treated to Date: 11
Plan Prescribed Dose Per Fraction: 1.8 Gy
Plan Total Fractions Prescribed: 12
Plan Total Prescribed Dose: 21.6 Gy
Reference Point Dosage Given to Date: 43.2 Gy
Reference Point Session Dosage Given: 1.8 Gy
Session Number: 24

## 2024-11-02 ENCOUNTER — Ambulatory Visit

## 2024-11-03 ENCOUNTER — Ambulatory Visit

## 2024-11-04 ENCOUNTER — Ambulatory Visit

## 2024-11-05 ENCOUNTER — Ambulatory Visit

## 2024-11-06 ENCOUNTER — Ambulatory Visit

## 2024-11-09 ENCOUNTER — Ambulatory Visit

## 2024-11-10 ENCOUNTER — Ambulatory Visit

## 2024-11-11 ENCOUNTER — Ambulatory Visit

## 2024-11-12 ENCOUNTER — Ambulatory Visit

## 2024-11-13 ENCOUNTER — Ambulatory Visit

## 2024-11-16 ENCOUNTER — Ambulatory Visit

## 2024-11-17 ENCOUNTER — Ambulatory Visit

## 2024-11-18 ENCOUNTER — Ambulatory Visit

## 2024-11-19 ENCOUNTER — Ambulatory Visit

## 2025-03-10 ENCOUNTER — Inpatient Hospital Stay: Admitting: Oncology

## 2025-03-10 ENCOUNTER — Inpatient Hospital Stay
